# Patient Record
Sex: Male | Born: 1960 | Race: White | Hispanic: No | Smoking: Current every day smoker
Health system: Southern US, Community
[De-identification: ages and names within clinical notes are randomized; demographics above are authoritative.]

## PROBLEM LIST (undated history)

## (undated) DIAGNOSIS — M4802 Spinal stenosis, cervical region: Secondary | ICD-10-CM

## (undated) DIAGNOSIS — E039 Hypothyroidism, unspecified: Secondary | ICD-10-CM

## (undated) DIAGNOSIS — H9190 Unspecified hearing loss, unspecified ear: Secondary | ICD-10-CM

## (undated) DIAGNOSIS — C61 Malignant neoplasm of prostate: Secondary | ICD-10-CM

## (undated) DIAGNOSIS — E079 Disorder of thyroid, unspecified: Secondary | ICD-10-CM

## (undated) DIAGNOSIS — F329 Major depressive disorder, single episode, unspecified: Secondary | ICD-10-CM

## (undated) DIAGNOSIS — N529 Male erectile dysfunction, unspecified: Secondary | ICD-10-CM

## (undated) DIAGNOSIS — R972 Elevated prostate specific antigen [PSA]: Secondary | ICD-10-CM

## (undated) DIAGNOSIS — Z923 Personal history of irradiation: Secondary | ICD-10-CM

## (undated) DIAGNOSIS — R112 Nausea with vomiting, unspecified: Secondary | ICD-10-CM

## (undated) DIAGNOSIS — T8484XA Pain due to internal orthopedic prosthetic devices, implants and grafts, initial encounter: Secondary | ICD-10-CM

## (undated) DIAGNOSIS — J189 Pneumonia, unspecified organism: Secondary | ICD-10-CM

## (undated) DIAGNOSIS — C6290 Malignant neoplasm of unspecified testis, unspecified whether descended or undescended: Secondary | ICD-10-CM

## (undated) DIAGNOSIS — S81802A Unspecified open wound, left lower leg, initial encounter: Secondary | ICD-10-CM

## (undated) DIAGNOSIS — J069 Acute upper respiratory infection, unspecified: Secondary | ICD-10-CM

## (undated) DIAGNOSIS — B9620 Unspecified Escherichia coli [E. coli] as the cause of diseases classified elsewhere: Secondary | ICD-10-CM

## (undated) DIAGNOSIS — K7290 Hepatic failure, unspecified without coma: Secondary | ICD-10-CM

## (undated) DIAGNOSIS — N390 Urinary tract infection, site not specified: Secondary | ICD-10-CM

## (undated) DIAGNOSIS — C801 Malignant (primary) neoplasm, unspecified: Secondary | ICD-10-CM

## (undated) DIAGNOSIS — S129XXA Fracture of neck, unspecified, initial encounter: Secondary | ICD-10-CM

## (undated) DIAGNOSIS — Z972 Presence of dental prosthetic device (complete) (partial): Secondary | ICD-10-CM

## (undated) DIAGNOSIS — I898 Other specified noninfective disorders of lymphatic vessels and lymph nodes: Secondary | ICD-10-CM

## (undated) DIAGNOSIS — R300 Dysuria: Secondary | ICD-10-CM

## (undated) DIAGNOSIS — F524 Premature ejaculation: Secondary | ICD-10-CM

## (undated) DIAGNOSIS — K7460 Unspecified cirrhosis of liver: Secondary | ICD-10-CM

## (undated) DIAGNOSIS — E291 Testicular hypofunction: Secondary | ICD-10-CM

## (undated) DIAGNOSIS — K7682 Hepatic encephalopathy: Secondary | ICD-10-CM

## (undated) DIAGNOSIS — F1020 Alcohol dependence, uncomplicated: Secondary | ICD-10-CM

## (undated) DIAGNOSIS — C6292 Malignant neoplasm of left testis, unspecified whether descended or undescended: Secondary | ICD-10-CM

## (undated) DIAGNOSIS — R350 Frequency of micturition: Secondary | ICD-10-CM

## (undated) DIAGNOSIS — Z9889 Other specified postprocedural states: Secondary | ICD-10-CM

## (undated) DIAGNOSIS — K08109 Complete loss of teeth, unspecified cause, unspecified class: Secondary | ICD-10-CM

## (undated) DIAGNOSIS — R569 Unspecified convulsions: Secondary | ICD-10-CM

## (undated) DIAGNOSIS — M549 Dorsalgia, unspecified: Secondary | ICD-10-CM

## (undated) DIAGNOSIS — T8131XA Disruption of external operation (surgical) wound, not elsewhere classified, initial encounter: Secondary | ICD-10-CM

## (undated) DIAGNOSIS — R600 Localized edema: Secondary | ICD-10-CM

## (undated) DIAGNOSIS — F419 Anxiety disorder, unspecified: Secondary | ICD-10-CM

## (undated) DIAGNOSIS — F32A Depression, unspecified: Secondary | ICD-10-CM

## (undated) HISTORY — PX: NECK SURGERY: SHX720

## (undated) HISTORY — PX: DENTAL SURGERY: SHX609

## (undated) HISTORY — PX: PROSTATE SURGERY: SHX751

## (undated) HISTORY — PX: BACK SURGERY: SHX140

## (undated) HISTORY — PX: LEG SURGERY: SHX1003

## (undated) HISTORY — PX: APPENDECTOMY: SHX54

## (undated) HISTORY — PX: MULTIPLE TOOTH EXTRACTIONS: SHX2053

---

## 1997-10-28 ENCOUNTER — Emergency Department (HOSPITAL_COMMUNITY): Admission: EM | Admit: 1997-10-28 | Discharge: 1997-10-28 | Payer: Self-pay | Admitting: Emergency Medicine

## 1998-03-21 ENCOUNTER — Emergency Department (HOSPITAL_COMMUNITY): Admission: EM | Admit: 1998-03-21 | Discharge: 1998-03-21 | Payer: Self-pay | Admitting: *Deleted

## 1999-04-19 ENCOUNTER — Encounter: Payer: Self-pay | Admitting: Emergency Medicine

## 1999-04-19 ENCOUNTER — Emergency Department (HOSPITAL_COMMUNITY): Admission: EM | Admit: 1999-04-19 | Discharge: 1999-04-19 | Payer: Self-pay | Admitting: Emergency Medicine

## 1999-04-26 ENCOUNTER — Emergency Department (HOSPITAL_COMMUNITY): Admission: EM | Admit: 1999-04-26 | Discharge: 1999-04-26 | Payer: Self-pay | Admitting: Emergency Medicine

## 2000-01-21 ENCOUNTER — Encounter: Payer: Self-pay | Admitting: Emergency Medicine

## 2000-01-21 ENCOUNTER — Emergency Department (HOSPITAL_COMMUNITY): Admission: EM | Admit: 2000-01-21 | Discharge: 2000-01-21 | Payer: Self-pay | Admitting: Emergency Medicine

## 2000-03-25 ENCOUNTER — Encounter: Payer: Self-pay | Admitting: Emergency Medicine

## 2000-03-25 ENCOUNTER — Emergency Department (HOSPITAL_COMMUNITY): Admission: EM | Admit: 2000-03-25 | Discharge: 2000-03-25 | Payer: Self-pay | Admitting: Emergency Medicine

## 2001-04-21 ENCOUNTER — Emergency Department (HOSPITAL_COMMUNITY): Admission: EM | Admit: 2001-04-21 | Discharge: 2001-04-21 | Payer: Self-pay | Admitting: *Deleted

## 2002-06-03 ENCOUNTER — Encounter: Payer: Self-pay | Admitting: Emergency Medicine

## 2002-06-03 ENCOUNTER — Emergency Department (HOSPITAL_COMMUNITY): Admission: EM | Admit: 2002-06-03 | Discharge: 2002-06-04 | Payer: Self-pay | Admitting: Emergency Medicine

## 2003-01-14 ENCOUNTER — Encounter: Payer: Self-pay | Admitting: Emergency Medicine

## 2003-01-14 ENCOUNTER — Inpatient Hospital Stay (HOSPITAL_COMMUNITY): Admission: EM | Admit: 2003-01-14 | Discharge: 2003-01-25 | Payer: Self-pay | Admitting: Emergency Medicine

## 2003-01-17 ENCOUNTER — Encounter: Payer: Self-pay | Admitting: Internal Medicine

## 2003-01-20 ENCOUNTER — Encounter: Payer: Self-pay | Admitting: Internal Medicine

## 2003-01-20 ENCOUNTER — Encounter (INDEPENDENT_AMBULATORY_CARE_PROVIDER_SITE_OTHER): Payer: Self-pay | Admitting: Emergency Medicine

## 2003-01-22 ENCOUNTER — Encounter: Payer: Self-pay | Admitting: Internal Medicine

## 2003-01-23 ENCOUNTER — Encounter (INDEPENDENT_AMBULATORY_CARE_PROVIDER_SITE_OTHER): Payer: Self-pay | Admitting: Infectious Diseases

## 2003-03-25 ENCOUNTER — Emergency Department (HOSPITAL_COMMUNITY): Admission: EM | Admit: 2003-03-25 | Discharge: 2003-03-25 | Payer: Self-pay | Admitting: *Deleted

## 2004-06-03 ENCOUNTER — Emergency Department (HOSPITAL_COMMUNITY): Admission: EM | Admit: 2004-06-03 | Discharge: 2004-06-03 | Payer: Self-pay | Admitting: Emergency Medicine

## 2004-06-11 ENCOUNTER — Emergency Department (HOSPITAL_COMMUNITY): Admission: EM | Admit: 2004-06-11 | Discharge: 2004-06-11 | Payer: Self-pay | Admitting: Emergency Medicine

## 2004-06-15 ENCOUNTER — Emergency Department (HOSPITAL_COMMUNITY): Admission: EM | Admit: 2004-06-15 | Discharge: 2004-06-15 | Payer: Self-pay | Admitting: *Deleted

## 2004-06-24 ENCOUNTER — Emergency Department (HOSPITAL_COMMUNITY): Admission: EM | Admit: 2004-06-24 | Discharge: 2004-06-24 | Payer: Self-pay | Admitting: Emergency Medicine

## 2004-07-01 ENCOUNTER — Emergency Department (HOSPITAL_COMMUNITY): Admission: EM | Admit: 2004-07-01 | Discharge: 2004-07-01 | Payer: Self-pay | Admitting: Emergency Medicine

## 2004-07-07 ENCOUNTER — Emergency Department (HOSPITAL_COMMUNITY): Admission: EM | Admit: 2004-07-07 | Discharge: 2004-07-07 | Payer: Self-pay | Admitting: Emergency Medicine

## 2004-07-13 ENCOUNTER — Emergency Department (HOSPITAL_COMMUNITY): Admission: EM | Admit: 2004-07-13 | Discharge: 2004-07-13 | Payer: Self-pay | Admitting: Emergency Medicine

## 2004-09-26 ENCOUNTER — Emergency Department (HOSPITAL_COMMUNITY): Admission: EM | Admit: 2004-09-26 | Discharge: 2004-09-26 | Payer: Self-pay | Admitting: Family Medicine

## 2004-12-10 ENCOUNTER — Ambulatory Visit: Payer: Self-pay | Admitting: Internal Medicine

## 2004-12-12 ENCOUNTER — Ambulatory Visit: Payer: Self-pay | Admitting: *Deleted

## 2004-12-25 ENCOUNTER — Ambulatory Visit: Payer: Self-pay | Admitting: Family Medicine

## 2005-01-08 ENCOUNTER — Ambulatory Visit: Payer: Self-pay | Admitting: Family Medicine

## 2005-01-09 ENCOUNTER — Ambulatory Visit (HOSPITAL_COMMUNITY): Admission: RE | Admit: 2005-01-09 | Discharge: 2005-01-09 | Payer: Self-pay | Admitting: Family Medicine

## 2005-01-22 ENCOUNTER — Ambulatory Visit: Payer: Self-pay | Admitting: Family Medicine

## 2005-03-11 ENCOUNTER — Ambulatory Visit: Payer: Self-pay | Admitting: Family Medicine

## 2005-04-09 ENCOUNTER — Ambulatory Visit: Payer: Self-pay | Admitting: Family Medicine

## 2005-06-10 ENCOUNTER — Ambulatory Visit: Payer: Self-pay | Admitting: Family Medicine

## 2005-09-09 ENCOUNTER — Ambulatory Visit: Payer: Self-pay | Admitting: Family Medicine

## 2005-09-29 ENCOUNTER — Ambulatory Visit: Payer: Self-pay | Admitting: Family Medicine

## 2005-10-13 ENCOUNTER — Ambulatory Visit: Payer: Self-pay | Admitting: Family Medicine

## 2005-12-18 ENCOUNTER — Observation Stay (HOSPITAL_COMMUNITY): Admission: EM | Admit: 2005-12-18 | Discharge: 2005-12-20 | Payer: Self-pay | Admitting: Emergency Medicine

## 2005-12-18 ENCOUNTER — Ambulatory Visit: Payer: Self-pay | Admitting: Family Medicine

## 2005-12-22 ENCOUNTER — Encounter (HOSPITAL_COMMUNITY): Admission: RE | Admit: 2005-12-22 | Discharge: 2006-01-08 | Payer: Self-pay | Admitting: Family Medicine

## 2006-01-05 ENCOUNTER — Ambulatory Visit: Payer: Self-pay | Admitting: Family Medicine

## 2006-01-12 ENCOUNTER — Encounter (HOSPITAL_COMMUNITY): Admission: RE | Admit: 2006-01-12 | Discharge: 2006-04-12 | Payer: Self-pay | Admitting: Family Medicine

## 2006-01-12 ENCOUNTER — Ambulatory Visit: Payer: Self-pay | Admitting: Family Medicine

## 2006-01-26 ENCOUNTER — Ambulatory Visit: Payer: Self-pay | Admitting: Family Medicine

## 2006-01-29 ENCOUNTER — Ambulatory Visit (HOSPITAL_COMMUNITY): Admission: RE | Admit: 2006-01-29 | Discharge: 2006-01-29 | Payer: Self-pay | Admitting: Family Medicine

## 2006-01-29 ENCOUNTER — Encounter (INDEPENDENT_AMBULATORY_CARE_PROVIDER_SITE_OTHER): Payer: Self-pay | Admitting: Cardiology

## 2006-01-30 ENCOUNTER — Ambulatory Visit: Payer: Self-pay | Admitting: Family Medicine

## 2006-01-30 ENCOUNTER — Inpatient Hospital Stay (HOSPITAL_COMMUNITY): Admission: EM | Admit: 2006-01-30 | Discharge: 2006-02-03 | Payer: Self-pay | Admitting: Emergency Medicine

## 2006-02-09 ENCOUNTER — Ambulatory Visit: Payer: Self-pay | Admitting: Family Medicine

## 2006-02-23 ENCOUNTER — Ambulatory Visit: Payer: Self-pay | Admitting: Family Medicine

## 2006-02-24 ENCOUNTER — Ambulatory Visit (HOSPITAL_COMMUNITY): Admission: RE | Admit: 2006-02-24 | Discharge: 2006-02-24 | Payer: Self-pay | Admitting: Family Medicine

## 2006-03-10 ENCOUNTER — Ambulatory Visit: Payer: Self-pay | Admitting: Family Medicine

## 2006-03-30 ENCOUNTER — Encounter: Admission: RE | Admit: 2006-03-30 | Discharge: 2006-03-30 | Payer: Self-pay | Admitting: Gastroenterology

## 2006-03-31 ENCOUNTER — Ambulatory Visit: Payer: Self-pay | Admitting: *Deleted

## 2006-04-06 ENCOUNTER — Ambulatory Visit: Payer: Self-pay | Admitting: Family Medicine

## 2006-06-08 ENCOUNTER — Ambulatory Visit: Payer: Self-pay | Admitting: Family Medicine

## 2006-06-19 ENCOUNTER — Ambulatory Visit: Payer: Self-pay | Admitting: Family Medicine

## 2006-07-29 ENCOUNTER — Ambulatory Visit: Payer: Self-pay | Admitting: Dentistry

## 2006-07-29 ENCOUNTER — Encounter: Admission: AD | Admit: 2006-07-29 | Discharge: 2006-07-29 | Payer: Self-pay | Admitting: Dentistry

## 2006-08-13 ENCOUNTER — Ambulatory Visit (HOSPITAL_COMMUNITY): Admission: RE | Admit: 2006-08-13 | Discharge: 2006-08-13 | Payer: Self-pay | Admitting: Dentistry

## 2006-08-24 ENCOUNTER — Ambulatory Visit: Payer: Self-pay | Admitting: Family Medicine

## 2006-09-02 ENCOUNTER — Ambulatory Visit: Payer: Self-pay | Admitting: Family Medicine

## 2006-09-14 ENCOUNTER — Ambulatory Visit: Payer: Self-pay | Admitting: Dentistry

## 2006-10-26 ENCOUNTER — Ambulatory Visit: Payer: Self-pay | Admitting: Family Medicine

## 2006-10-28 ENCOUNTER — Ambulatory Visit: Payer: Self-pay | Admitting: Dentistry

## 2006-11-03 ENCOUNTER — Ambulatory Visit: Payer: Self-pay | Admitting: Family Medicine

## 2006-11-16 ENCOUNTER — Ambulatory Visit: Payer: Self-pay | Admitting: Family Medicine

## 2006-11-16 LAB — CONVERTED CEMR LAB
Barbiturate Quant, Ur: NEGATIVE
Benzodiazepines.: NEGATIVE
Cocaine Metabolites: NEGATIVE
Creatinine,U: 19.6 mg/dL
Methadone: NEGATIVE

## 2006-12-08 ENCOUNTER — Ambulatory Visit: Payer: Self-pay | Admitting: Family Medicine

## 2006-12-09 ENCOUNTER — Encounter (INDEPENDENT_AMBULATORY_CARE_PROVIDER_SITE_OTHER): Payer: Self-pay | Admitting: Family Medicine

## 2006-12-09 LAB — CONVERTED CEMR LAB
Calcium: 9.3 mg/dL (ref 8.4–10.5)
Free T4: 1.77 ng/dL (ref 0.89–1.80)
Potassium: 4 meq/L (ref 3.5–5.3)
Sodium: 138 meq/L (ref 135–145)
TSH: <0.004 microintl units/mL — ABNORMAL LOW (ref 0.350–5.50)

## 2006-12-17 DIAGNOSIS — F411 Generalized anxiety disorder: Secondary | ICD-10-CM | POA: Insufficient documentation

## 2006-12-17 DIAGNOSIS — K7460 Unspecified cirrhosis of liver: Secondary | ICD-10-CM | POA: Insufficient documentation

## 2006-12-17 DIAGNOSIS — F1011 Alcohol abuse, in remission: Secondary | ICD-10-CM | POA: Insufficient documentation

## 2006-12-17 DIAGNOSIS — R569 Unspecified convulsions: Secondary | ICD-10-CM | POA: Insufficient documentation

## 2006-12-17 DIAGNOSIS — Z8701 Personal history of pneumonia (recurrent): Secondary | ICD-10-CM | POA: Insufficient documentation

## 2007-01-05 ENCOUNTER — Ambulatory Visit: Payer: Self-pay | Admitting: Dentistry

## 2007-01-13 ENCOUNTER — Encounter (INDEPENDENT_AMBULATORY_CARE_PROVIDER_SITE_OTHER): Payer: Self-pay | Admitting: *Deleted

## 2007-02-05 ENCOUNTER — Ambulatory Visit: Payer: Self-pay | Admitting: Family Medicine

## 2007-02-05 LAB — CONVERTED CEMR LAB
Alcohol, Ethyl (B): <5 mg/dL (ref 0–10)
Benzodiazepines.: NEGATIVE
Creatinine,U: 52 mg/dL
Methadone: NEGATIVE
Phencyclidine (PCP): NEGATIVE
Propoxyphene: NEGATIVE

## 2007-03-04 ENCOUNTER — Telehealth (INDEPENDENT_AMBULATORY_CARE_PROVIDER_SITE_OTHER): Payer: Self-pay | Admitting: *Deleted

## 2007-03-05 ENCOUNTER — Ambulatory Visit: Payer: Self-pay | Admitting: Family Medicine

## 2007-03-05 LAB — CONVERTED CEMR LAB
Alcohol, Ethyl (B): <5 mg/dL (ref 0–10)
Benzodiazepines.: NEGATIVE
Creatinine,U: 32.3 mg/dL
Marijuana Metabolite: NEGATIVE
Methadone: NEGATIVE
Phencyclidine (PCP): NEGATIVE
Propoxyphene: NEGATIVE

## 2007-03-10 ENCOUNTER — Encounter (INDEPENDENT_AMBULATORY_CARE_PROVIDER_SITE_OTHER): Payer: Self-pay | Admitting: Family Medicine

## 2007-03-30 ENCOUNTER — Ambulatory Visit: Payer: Self-pay | Admitting: Family Medicine

## 2007-03-30 DIAGNOSIS — E890 Postprocedural hypothyroidism: Secondary | ICD-10-CM

## 2007-03-31 ENCOUNTER — Telehealth (INDEPENDENT_AMBULATORY_CARE_PROVIDER_SITE_OTHER): Payer: Self-pay | Admitting: Family Medicine

## 2007-04-01 ENCOUNTER — Encounter (INDEPENDENT_AMBULATORY_CARE_PROVIDER_SITE_OTHER): Payer: Self-pay | Admitting: Family Medicine

## 2007-04-01 LAB — CONVERTED CEMR LAB
Amphetamine Screen, Ur: NEGATIVE
Basophils Relative: 0 % (ref 0–1)
Benzodiazepines.: NEGATIVE
Lymphocytes Relative: 20 % (ref 12–46)
MCHC: 36 g/dL (ref 30.0–36.0)
Methadone: NEGATIVE
Monocytes Relative: 8 % (ref 3–12)
Neutro Abs: 8 10*3/uL — ABNORMAL HIGH (ref 1.7–7.7)
Neutrophils Relative %: 69 % (ref 43–77)
Phencyclidine (PCP): NEGATIVE
Propoxyphene: NEGATIVE
RBC: 4.89 M/uL (ref 4.22–5.81)
TSH: 0.017 microintl units/mL — ABNORMAL LOW (ref 0.350–5.50)
WBC: 11.6 10*3/uL — ABNORMAL HIGH (ref 4.0–10.5)

## 2007-04-05 ENCOUNTER — Encounter (INDEPENDENT_AMBULATORY_CARE_PROVIDER_SITE_OTHER): Payer: Self-pay | Admitting: Family Medicine

## 2007-04-21 ENCOUNTER — Telehealth (INDEPENDENT_AMBULATORY_CARE_PROVIDER_SITE_OTHER): Payer: Self-pay | Admitting: Family Medicine

## 2007-05-06 ENCOUNTER — Encounter (INDEPENDENT_AMBULATORY_CARE_PROVIDER_SITE_OTHER): Payer: Self-pay | Admitting: Family Medicine

## 2007-05-10 ENCOUNTER — Telehealth (INDEPENDENT_AMBULATORY_CARE_PROVIDER_SITE_OTHER): Payer: Self-pay | Admitting: Family Medicine

## 2007-05-31 ENCOUNTER — Ambulatory Visit: Payer: Self-pay | Admitting: Family Medicine

## 2007-07-07 ENCOUNTER — Telehealth (INDEPENDENT_AMBULATORY_CARE_PROVIDER_SITE_OTHER): Payer: Self-pay | Admitting: Family Medicine

## 2007-07-07 ENCOUNTER — Ambulatory Visit: Payer: Self-pay | Admitting: Family Medicine

## 2007-07-07 DIAGNOSIS — M545 Low back pain: Secondary | ICD-10-CM | POA: Insufficient documentation

## 2007-07-07 LAB — CONVERTED CEMR LAB
T3, Total: 215.3 ng/dL — ABNORMAL HIGH (ref 80.0–204.0)
TSH: 0.004 microintl units/mL — ABNORMAL LOW (ref 0.350–5.50)

## 2007-07-08 ENCOUNTER — Encounter (INDEPENDENT_AMBULATORY_CARE_PROVIDER_SITE_OTHER): Payer: Self-pay | Admitting: Family Medicine

## 2007-07-09 LAB — CONVERTED CEMR LAB
BUN: 5 mg/dL — ABNORMAL LOW (ref 6–23)
CO2: 20 meq/L (ref 19–32)
Calcium: 8.8 mg/dL (ref 8.4–10.5)
Chloride: 109 meq/L (ref 96–112)
Creatinine, Ser: 0.85 mg/dL (ref 0.40–1.50)
TSH: <0.004 microintl units/mL — ABNORMAL LOW (ref 0.350–5.50)

## 2007-08-05 ENCOUNTER — Encounter (INDEPENDENT_AMBULATORY_CARE_PROVIDER_SITE_OTHER): Payer: Self-pay | Admitting: Family Medicine

## 2007-08-25 ENCOUNTER — Telehealth (INDEPENDENT_AMBULATORY_CARE_PROVIDER_SITE_OTHER): Payer: Self-pay | Admitting: Family Medicine

## 2007-09-06 ENCOUNTER — Ambulatory Visit: Payer: Self-pay | Admitting: Family Medicine

## 2007-09-08 ENCOUNTER — Encounter (INDEPENDENT_AMBULATORY_CARE_PROVIDER_SITE_OTHER): Payer: Self-pay | Admitting: Family Medicine

## 2007-09-08 LAB — CONVERTED CEMR LAB
ALT: 15 units/L (ref 0–53)
CO2: 26 meq/L (ref 19–32)
Calcium: 9 mg/dL (ref 8.4–10.5)
Chloride: 101 meq/L (ref 96–112)
Potassium: 4.2 meq/L (ref 3.5–5.3)
Sodium: 138 meq/L (ref 135–145)
T3 Uptake Ratio: 36.1 % (ref 22.5–37.0)
Total Protein: 7 g/dL (ref 6.0–8.3)

## 2007-09-15 ENCOUNTER — Ambulatory Visit (HOSPITAL_COMMUNITY): Admission: RE | Admit: 2007-09-15 | Discharge: 2007-09-15 | Payer: Self-pay | Admitting: Family Medicine

## 2007-09-17 ENCOUNTER — Encounter: Payer: Self-pay | Admitting: Specialist

## 2007-09-27 ENCOUNTER — Encounter: Payer: Self-pay | Admitting: Specialist

## 2007-12-09 ENCOUNTER — Encounter (INDEPENDENT_AMBULATORY_CARE_PROVIDER_SITE_OTHER): Payer: Self-pay | Admitting: Family Medicine

## 2008-01-20 ENCOUNTER — Telehealth (INDEPENDENT_AMBULATORY_CARE_PROVIDER_SITE_OTHER): Payer: Self-pay | Admitting: *Deleted

## 2008-01-30 ENCOUNTER — Emergency Department (HOSPITAL_COMMUNITY): Admission: EM | Admit: 2008-01-30 | Discharge: 2008-01-30 | Payer: Self-pay | Admitting: Emergency Medicine

## 2008-02-01 ENCOUNTER — Encounter (INDEPENDENT_AMBULATORY_CARE_PROVIDER_SITE_OTHER): Payer: Self-pay | Admitting: Family Medicine

## 2008-02-01 ENCOUNTER — Ambulatory Visit (HOSPITAL_COMMUNITY): Admission: RE | Admit: 2008-02-01 | Discharge: 2008-02-01 | Payer: Self-pay | Admitting: Gastroenterology

## 2008-02-08 ENCOUNTER — Encounter (INDEPENDENT_AMBULATORY_CARE_PROVIDER_SITE_OTHER): Payer: Self-pay | Admitting: Family Medicine

## 2008-02-25 ENCOUNTER — Encounter (INDEPENDENT_AMBULATORY_CARE_PROVIDER_SITE_OTHER): Payer: Self-pay | Admitting: Family Medicine

## 2008-03-17 ENCOUNTER — Encounter (INDEPENDENT_AMBULATORY_CARE_PROVIDER_SITE_OTHER): Payer: Self-pay | Admitting: Family Medicine

## 2008-03-21 ENCOUNTER — Encounter (INDEPENDENT_AMBULATORY_CARE_PROVIDER_SITE_OTHER): Payer: Self-pay | Admitting: Family Medicine

## 2008-04-03 ENCOUNTER — Ambulatory Visit: Payer: Self-pay | Admitting: Family Medicine

## 2008-04-13 ENCOUNTER — Telehealth (INDEPENDENT_AMBULATORY_CARE_PROVIDER_SITE_OTHER): Payer: Self-pay | Admitting: *Deleted

## 2008-04-13 LAB — CONVERTED CEMR LAB
Cholesterol: 212 mg/dL — ABNORMAL HIGH (ref 0–200)
Free T4: 0.93 ng/dL (ref 0.89–1.80)
TSH: 0.009 microintl units/mL — ABNORMAL LOW (ref 0.350–4.50)
Total CHOL/HDL Ratio: 5.9
VLDL: 18 mg/dL (ref 0–40)

## 2008-04-28 DIAGNOSIS — C6292 Malignant neoplasm of left testis, unspecified whether descended or undescended: Secondary | ICD-10-CM

## 2008-04-28 HISTORY — PX: ORCHIECTOMY: SHX2116

## 2008-04-28 HISTORY — DX: Malignant neoplasm of left testis, unspecified whether descended or undescended: C62.92

## 2008-05-04 ENCOUNTER — Encounter (INDEPENDENT_AMBULATORY_CARE_PROVIDER_SITE_OTHER): Payer: Self-pay | Admitting: Family Medicine

## 2008-05-15 ENCOUNTER — Encounter (INDEPENDENT_AMBULATORY_CARE_PROVIDER_SITE_OTHER): Payer: Self-pay | Admitting: Family Medicine

## 2008-05-18 ENCOUNTER — Encounter (HOSPITAL_COMMUNITY): Admission: RE | Admit: 2008-05-18 | Discharge: 2008-08-16 | Payer: Self-pay | Admitting: Internal Medicine

## 2008-05-26 ENCOUNTER — Encounter (INDEPENDENT_AMBULATORY_CARE_PROVIDER_SITE_OTHER): Payer: Self-pay | Admitting: Family Medicine

## 2008-06-05 ENCOUNTER — Encounter (HOSPITAL_COMMUNITY): Admission: RE | Admit: 2008-06-05 | Discharge: 2008-09-03 | Payer: Self-pay | Admitting: Internal Medicine

## 2008-07-06 ENCOUNTER — Encounter (INDEPENDENT_AMBULATORY_CARE_PROVIDER_SITE_OTHER): Payer: Self-pay | Admitting: Family Medicine

## 2008-08-03 ENCOUNTER — Encounter (INDEPENDENT_AMBULATORY_CARE_PROVIDER_SITE_OTHER): Payer: Self-pay | Admitting: Family Medicine

## 2008-09-13 ENCOUNTER — Ambulatory Visit: Payer: Self-pay | Admitting: Family Medicine

## 2008-09-13 DIAGNOSIS — E039 Hypothyroidism, unspecified: Secondary | ICD-10-CM | POA: Insufficient documentation

## 2008-10-20 ENCOUNTER — Emergency Department: Payer: Self-pay | Admitting: Emergency Medicine

## 2008-10-26 ENCOUNTER — Encounter: Payer: Self-pay | Admitting: Physician Assistant

## 2008-10-31 ENCOUNTER — Ambulatory Visit: Payer: Self-pay | Admitting: Physician Assistant

## 2008-10-31 DIAGNOSIS — N5089 Other specified disorders of the male genital organs: Secondary | ICD-10-CM | POA: Insufficient documentation

## 2008-10-31 LAB — CONVERTED CEMR LAB
Bilirubin Urine: NEGATIVE
Free T4: 1.21 ng/dL
Protein, U semiquant: NEGATIVE
Specific Gravity, Urine: 1.01
TSH: 0.971 microintl units/mL
Urobilinogen, UA: 0.2

## 2008-11-01 ENCOUNTER — Encounter (INDEPENDENT_AMBULATORY_CARE_PROVIDER_SITE_OTHER): Payer: Self-pay | Admitting: Urology

## 2008-11-01 ENCOUNTER — Observation Stay (HOSPITAL_COMMUNITY): Admission: EM | Admit: 2008-11-01 | Discharge: 2008-11-02 | Payer: Self-pay | Admitting: Emergency Medicine

## 2008-11-01 ENCOUNTER — Ambulatory Visit (HOSPITAL_COMMUNITY): Admission: RE | Admit: 2008-11-01 | Discharge: 2008-11-01 | Payer: Self-pay | Admitting: Internal Medicine

## 2008-11-02 ENCOUNTER — Encounter (INDEPENDENT_AMBULATORY_CARE_PROVIDER_SITE_OTHER): Payer: Self-pay | Admitting: Internal Medicine

## 2008-11-06 ENCOUNTER — Telehealth: Payer: Self-pay | Admitting: Physician Assistant

## 2008-11-07 ENCOUNTER — Encounter: Payer: Self-pay | Admitting: Physician Assistant

## 2008-11-07 DIAGNOSIS — Z9079 Acquired absence of other genital organ(s): Secondary | ICD-10-CM | POA: Insufficient documentation

## 2008-11-08 ENCOUNTER — Encounter (INDEPENDENT_AMBULATORY_CARE_PROVIDER_SITE_OTHER): Payer: Self-pay | Admitting: Nurse Practitioner

## 2008-11-09 ENCOUNTER — Telehealth: Payer: Self-pay | Admitting: Physician Assistant

## 2008-11-13 ENCOUNTER — Encounter: Admission: RE | Admit: 2008-11-13 | Discharge: 2008-11-13 | Payer: Self-pay | Admitting: Gastroenterology

## 2008-11-22 ENCOUNTER — Ambulatory Visit: Admission: RE | Admit: 2008-11-22 | Discharge: 2009-01-09 | Payer: Self-pay | Admitting: Radiation Oncology

## 2008-11-23 ENCOUNTER — Encounter (INDEPENDENT_AMBULATORY_CARE_PROVIDER_SITE_OTHER): Payer: Self-pay | Admitting: Nurse Practitioner

## 2008-12-01 ENCOUNTER — Encounter: Payer: Self-pay | Admitting: Physician Assistant

## 2008-12-01 DIAGNOSIS — C6290 Malignant neoplasm of unspecified testis, unspecified whether descended or undescended: Secondary | ICD-10-CM

## 2008-12-21 ENCOUNTER — Encounter: Payer: Self-pay | Admitting: Physician Assistant

## 2009-01-25 ENCOUNTER — Encounter: Payer: Self-pay | Admitting: Physician Assistant

## 2009-01-25 LAB — CONVERTED CEMR LAB
ALT: 16 units/L
AST: 20 units/L
Alkaline Phosphatase: 103 units/L
Chloride: 99 meq/L
Creatinine, Ser: 1 mg/dL
HCT: 33.2 %
Hemoglobin: 11.9 g/dL
MCHC: 35.7 g/dL
MCV: 91.6 fL
Platelets: 107 10*3/uL
RDW: 15.1 %
Total Bilirubin: 0.5 mg/dL

## 2009-01-26 ENCOUNTER — Encounter: Payer: Self-pay | Admitting: Physician Assistant

## 2009-01-26 ENCOUNTER — Ambulatory Visit (HOSPITAL_COMMUNITY): Admission: RE | Admit: 2009-01-26 | Discharge: 2009-01-26 | Payer: Self-pay | Admitting: Gastroenterology

## 2009-02-01 ENCOUNTER — Telehealth: Payer: Self-pay | Admitting: Physician Assistant

## 2009-02-02 ENCOUNTER — Ambulatory Visit: Payer: Self-pay | Admitting: Physician Assistant

## 2009-02-09 LAB — CONVERTED CEMR LAB
BUN: 7 mg/dL (ref 6–23)
CO2: 17 meq/L — ABNORMAL LOW (ref 19–32)
Glucose, Bld: 100 mg/dL — ABNORMAL HIGH (ref 70–99)
Phenytoin Lvl: 2 ug/mL — ABNORMAL LOW (ref 10.0–20.0)
Potassium: 3.9 meq/L (ref 3.5–5.3)

## 2009-02-12 ENCOUNTER — Ambulatory Visit: Payer: Self-pay | Admitting: Physician Assistant

## 2009-02-12 LAB — CONVERTED CEMR LAB
BUN: 12 mg/dL (ref 6–23)
CO2: 24 meq/L (ref 19–32)
Calcium: 9.4 mg/dL (ref 8.4–10.5)
Creatinine, Ser: 1.11 mg/dL (ref 0.40–1.50)
Glucose, Bld: 99 mg/dL (ref 70–99)

## 2009-02-13 ENCOUNTER — Encounter: Payer: Self-pay | Admitting: Physician Assistant

## 2009-02-16 ENCOUNTER — Encounter: Payer: Self-pay | Admitting: Physician Assistant

## 2009-02-20 ENCOUNTER — Telehealth: Payer: Self-pay | Admitting: Physician Assistant

## 2009-03-16 ENCOUNTER — Encounter: Payer: Self-pay | Admitting: Physician Assistant

## 2009-03-19 ENCOUNTER — Encounter: Admission: RE | Admit: 2009-03-19 | Discharge: 2009-03-19 | Payer: Self-pay | Admitting: Gastroenterology

## 2009-03-20 ENCOUNTER — Encounter: Payer: Self-pay | Admitting: Physician Assistant

## 2009-03-29 ENCOUNTER — Ambulatory Visit: Payer: Self-pay | Admitting: Physician Assistant

## 2009-04-10 ENCOUNTER — Encounter: Payer: Self-pay | Admitting: Physician Assistant

## 2009-04-24 ENCOUNTER — Telehealth: Payer: Self-pay | Admitting: Physician Assistant

## 2009-04-25 ENCOUNTER — Telehealth: Payer: Self-pay | Admitting: Physician Assistant

## 2009-04-25 ENCOUNTER — Encounter: Payer: Self-pay | Admitting: Physician Assistant

## 2009-05-01 ENCOUNTER — Encounter: Payer: Self-pay | Admitting: Physician Assistant

## 2009-05-06 ENCOUNTER — Encounter: Payer: Self-pay | Admitting: Physician Assistant

## 2009-05-09 ENCOUNTER — Encounter: Payer: Self-pay | Admitting: Physician Assistant

## 2009-05-11 ENCOUNTER — Telehealth: Payer: Self-pay | Admitting: Physician Assistant

## 2009-05-14 ENCOUNTER — Ambulatory Visit: Admission: RE | Admit: 2009-05-14 | Discharge: 2009-05-14 | Payer: Self-pay | Admitting: Radiation Oncology

## 2009-05-16 ENCOUNTER — Encounter: Payer: Self-pay | Admitting: Physician Assistant

## 2009-05-16 LAB — BETA HCG QUANT (REF LAB): Beta hCG, Tumor Marker: 0.9 m[IU]/mL (ref <5.0)

## 2009-05-16 LAB — AFP TUMOR MARKER: AFP-Tumor Marker: 3.1 ng/mL (ref 0.0–8.0)

## 2009-05-16 LAB — LACTATE DEHYDROGENASE: LDH: 193 U/L (ref 94–250)

## 2009-05-17 ENCOUNTER — Ambulatory Visit (HOSPITAL_COMMUNITY): Admission: RE | Admit: 2009-05-17 | Discharge: 2009-05-17 | Payer: Self-pay | Admitting: Radiation Oncology

## 2009-05-29 ENCOUNTER — Encounter: Payer: Self-pay | Admitting: Physician Assistant

## 2009-05-29 ENCOUNTER — Ambulatory Visit: Admission: RE | Admit: 2009-05-29 | Discharge: 2009-05-29 | Payer: Self-pay | Admitting: Radiation Oncology

## 2009-05-31 LAB — AFP TUMOR MARKER: AFP-Tumor Marker: 3.6 ng/mL (ref 0.0–8.0)

## 2009-06-11 ENCOUNTER — Encounter: Payer: Self-pay | Admitting: Physician Assistant

## 2009-08-03 ENCOUNTER — Ambulatory Visit: Payer: Self-pay | Admitting: Physician Assistant

## 2009-08-06 ENCOUNTER — Encounter: Payer: Self-pay | Admitting: Physician Assistant

## 2009-08-06 DIAGNOSIS — D649 Anemia, unspecified: Secondary | ICD-10-CM | POA: Insufficient documentation

## 2009-08-06 LAB — CONVERTED CEMR LAB
AST: 22 units/L (ref 0–37)
Albumin: 4.3 g/dL (ref 3.5–5.2)
Alkaline Phosphatase: 114 units/L (ref 39–117)
BUN: 9 mg/dL (ref 6–23)
Calcium: 8.5 mg/dL (ref 8.4–10.5)
Chloride: 99 meq/L (ref 96–112)
Eosinophils Relative: 2 % (ref 0–5)
HCT: 36.8 % — ABNORMAL LOW (ref 39.0–52.0)
Hemoglobin: 11.9 g/dL — ABNORMAL LOW (ref 13.0–17.0)
Lymphocytes Relative: 9 % — ABNORMAL LOW (ref 12–46)
Lymphs Abs: 0.5 10*3/uL — ABNORMAL LOW (ref 0.7–4.0)
Monocytes Absolute: 0.5 10*3/uL (ref 0.1–1.0)
Neutro Abs: 4.6 10*3/uL (ref 1.7–7.7)
Platelets: 109 10*3/uL — ABNORMAL LOW (ref 150–400)
Potassium: 3.5 meq/L (ref 3.5–5.3)
Sodium: 136 meq/L (ref 135–145)
Total Protein: 6.9 g/dL (ref 6.0–8.3)
WBC: 5.7 10*3/uL (ref 4.0–10.5)

## 2009-08-08 ENCOUNTER — Encounter: Payer: Self-pay | Admitting: Physician Assistant

## 2009-08-08 LAB — CONVERTED CEMR LAB
Ferritin: 52 ng/mL (ref 22–322)
Folate: 3 ng/mL — ABNORMAL LOW
Iron: 47 ug/dL (ref 42–165)
Saturation Ratios: 15 % — ABNORMAL LOW (ref 20–55)
Vitamin B-12: 344 pg/mL (ref 211–911)

## 2009-08-16 ENCOUNTER — Ambulatory Visit (HOSPITAL_COMMUNITY): Admission: RE | Admit: 2009-08-16 | Discharge: 2009-08-16 | Payer: Self-pay | Admitting: Internal Medicine

## 2009-08-27 ENCOUNTER — Telehealth (INDEPENDENT_AMBULATORY_CARE_PROVIDER_SITE_OTHER): Payer: Self-pay | Admitting: *Deleted

## 2009-09-05 ENCOUNTER — Encounter: Admission: RE | Admit: 2009-09-05 | Discharge: 2009-09-05 | Payer: Self-pay | Admitting: Internal Medicine

## 2009-09-05 ENCOUNTER — Encounter: Payer: Self-pay | Admitting: Physician Assistant

## 2009-09-28 ENCOUNTER — Encounter: Payer: Self-pay | Admitting: Physician Assistant

## 2009-10-02 ENCOUNTER — Ambulatory Visit (HOSPITAL_COMMUNITY): Admission: RE | Admit: 2009-10-02 | Discharge: 2009-10-02 | Payer: Self-pay | Admitting: Gastroenterology

## 2009-10-16 ENCOUNTER — Telehealth: Payer: Self-pay | Admitting: Physician Assistant

## 2009-10-22 ENCOUNTER — Encounter: Payer: Self-pay | Admitting: Physician Assistant

## 2009-10-26 ENCOUNTER — Telehealth: Payer: Self-pay | Admitting: Physician Assistant

## 2009-10-31 ENCOUNTER — Ambulatory Visit: Payer: Self-pay | Admitting: Physician Assistant

## 2009-10-31 DIAGNOSIS — F329 Major depressive disorder, single episode, unspecified: Secondary | ICD-10-CM | POA: Insufficient documentation

## 2009-11-01 ENCOUNTER — Encounter: Payer: Self-pay | Admitting: Physician Assistant

## 2009-11-02 LAB — CONVERTED CEMR LAB
Basophils Absolute: 0 10*3/uL (ref 0.0–0.1)
Basophils Relative: 0 % (ref 0–1)
Eosinophils Absolute: 0.1 10*3/uL (ref 0.0–0.7)
Eosinophils Relative: 1 % (ref 0–5)
HCT: 39.7 % (ref 39.0–52.0)
Iron: 90 ug/dL (ref 42–165)
Lymphs Abs: 1 10*3/uL (ref 0.7–4.0)
MCHC: 34.3 g/dL (ref 30.0–36.0)
MCV: 95.2 fL (ref 78.0–100.0)
Neutrophils Relative %: 83 % — ABNORMAL HIGH (ref 43–77)
Platelets: 150 10*3/uL (ref 150–400)
RDW: 15.3 % (ref 11.5–15.5)
UIBC: 240 ug/dL
WBC: 10.3 10*3/uL (ref 4.0–10.5)

## 2009-11-13 ENCOUNTER — Telehealth: Payer: Self-pay | Admitting: Physician Assistant

## 2009-11-22 ENCOUNTER — Ambulatory Visit: Payer: Self-pay | Admitting: Physician Assistant

## 2009-11-22 DIAGNOSIS — N529 Male erectile dysfunction, unspecified: Secondary | ICD-10-CM

## 2009-11-22 DIAGNOSIS — E291 Testicular hypofunction: Secondary | ICD-10-CM | POA: Insufficient documentation

## 2009-12-04 ENCOUNTER — Ambulatory Visit (HOSPITAL_COMMUNITY): Admission: RE | Admit: 2009-12-04 | Discharge: 2009-12-04 | Payer: Self-pay | Admitting: Orthopedic Surgery

## 2009-12-04 ENCOUNTER — Ambulatory Visit: Admission: RE | Admit: 2009-12-04 | Discharge: 2009-12-04 | Payer: Self-pay | Admitting: Radiation Oncology

## 2009-12-07 LAB — AFP TUMOR MARKER: AFP-Tumor Marker: 2.9 ng/mL (ref 0.0–8.0)

## 2009-12-08 ENCOUNTER — Emergency Department (HOSPITAL_COMMUNITY): Admission: EM | Admit: 2009-12-08 | Discharge: 2009-12-08 | Payer: Self-pay | Admitting: Emergency Medicine

## 2009-12-27 ENCOUNTER — Ambulatory Visit: Payer: Self-pay | Admitting: Physician Assistant

## 2009-12-27 LAB — CONVERTED CEMR LAB
ALT: 10 units/L (ref 0–53)
AST: 19 units/L (ref 0–37)
Albumin: 4.3 g/dL (ref 3.5–5.2)
Calcium: 9.8 mg/dL (ref 8.4–10.5)
Chloride: 98 meq/L (ref 96–112)
Creatinine, Ser: 1.01 mg/dL (ref 0.40–1.50)
Sodium: 139 meq/L (ref 135–145)

## 2009-12-28 ENCOUNTER — Encounter: Payer: Self-pay | Admitting: Physician Assistant

## 2010-01-15 ENCOUNTER — Encounter: Payer: Self-pay | Admitting: Physician Assistant

## 2010-01-15 ENCOUNTER — Encounter (INDEPENDENT_AMBULATORY_CARE_PROVIDER_SITE_OTHER): Payer: Self-pay | Admitting: Internal Medicine

## 2010-01-25 ENCOUNTER — Encounter: Payer: Self-pay | Admitting: Physician Assistant

## 2010-01-31 ENCOUNTER — Telehealth: Payer: Self-pay | Admitting: Physician Assistant

## 2010-02-13 ENCOUNTER — Telehealth (INDEPENDENT_AMBULATORY_CARE_PROVIDER_SITE_OTHER): Payer: Self-pay | Admitting: Nurse Practitioner

## 2010-02-17 ENCOUNTER — Emergency Department (HOSPITAL_COMMUNITY): Admission: EM | Admit: 2010-02-17 | Discharge: 2010-02-17 | Payer: Self-pay | Admitting: Emergency Medicine

## 2010-02-17 DIAGNOSIS — M51379 Other intervertebral disc degeneration, lumbosacral region without mention of lumbar back pain or lower extremity pain: Secondary | ICD-10-CM | POA: Insufficient documentation

## 2010-02-17 DIAGNOSIS — M5137 Other intervertebral disc degeneration, lumbosacral region: Secondary | ICD-10-CM

## 2010-02-18 ENCOUNTER — Telehealth (INDEPENDENT_AMBULATORY_CARE_PROVIDER_SITE_OTHER): Payer: Self-pay | Admitting: Nurse Practitioner

## 2010-02-22 ENCOUNTER — Telehealth (INDEPENDENT_AMBULATORY_CARE_PROVIDER_SITE_OTHER): Payer: Self-pay | Admitting: *Deleted

## 2010-03-01 ENCOUNTER — Telehealth (INDEPENDENT_AMBULATORY_CARE_PROVIDER_SITE_OTHER): Payer: Self-pay | Admitting: Internal Medicine

## 2010-03-01 ENCOUNTER — Ambulatory Visit: Payer: Self-pay | Admitting: Internal Medicine

## 2010-03-01 DIAGNOSIS — J069 Acute upper respiratory infection, unspecified: Secondary | ICD-10-CM

## 2010-03-01 HISTORY — DX: Acute upper respiratory infection, unspecified: J06.9

## 2010-03-05 LAB — CONVERTED CEMR LAB: Saturation Ratios: 17 % — ABNORMAL LOW (ref 20–55)

## 2010-03-06 ENCOUNTER — Encounter: Payer: Self-pay | Admitting: Physician Assistant

## 2010-03-11 ENCOUNTER — Ambulatory Visit: Payer: Self-pay | Admitting: Internal Medicine

## 2010-03-11 ENCOUNTER — Encounter: Payer: Self-pay | Admitting: Physician Assistant

## 2010-03-12 LAB — CONVERTED CEMR LAB
Hemoglobin: 14.6 g/dL (ref 13.0–17.0)
MCHC: 34 g/dL (ref 30.0–36.0)
RBC: 4.47 M/uL (ref 4.22–5.81)
WBC: 7.2 10*3/uL (ref 4.0–10.5)

## 2010-03-14 ENCOUNTER — Emergency Department (HOSPITAL_COMMUNITY): Admission: EM | Admit: 2010-03-14 | Discharge: 2010-03-14 | Payer: Self-pay | Admitting: Emergency Medicine

## 2010-03-15 ENCOUNTER — Emergency Department (HOSPITAL_COMMUNITY)
Admission: EM | Admit: 2010-03-15 | Discharge: 2010-03-17 | Disposition: A | Discharge status: Other Acute Inpatient Hospital | Payer: Self-pay | Source: Home / Self Care | Admitting: Emergency Medicine

## 2010-03-16 DIAGNOSIS — F1020 Alcohol dependence, uncomplicated: Secondary | ICD-10-CM

## 2010-03-17 ENCOUNTER — Ambulatory Visit: Payer: Self-pay | Admitting: Psychiatry

## 2010-03-17 ENCOUNTER — Inpatient Hospital Stay (HOSPITAL_COMMUNITY)
Admission: RE | Admit: 2010-03-17 | Discharge: 2010-03-20 | Payer: Self-pay | Source: Home / Self Care | Admitting: Psychiatry

## 2010-03-20 ENCOUNTER — Telehealth (INDEPENDENT_AMBULATORY_CARE_PROVIDER_SITE_OTHER): Payer: Self-pay | Admitting: Internal Medicine

## 2010-03-20 ENCOUNTER — Encounter (INDEPENDENT_AMBULATORY_CARE_PROVIDER_SITE_OTHER): Payer: Self-pay | Admitting: Internal Medicine

## 2010-04-12 ENCOUNTER — Inpatient Hospital Stay (HOSPITAL_COMMUNITY)
Admission: EM | Admit: 2010-04-12 | Discharge: 2010-04-16 | Payer: Self-pay | Source: Home / Self Care | Attending: Internal Medicine | Admitting: Internal Medicine

## 2010-04-12 ENCOUNTER — Encounter (INDEPENDENT_AMBULATORY_CARE_PROVIDER_SITE_OTHER): Payer: Self-pay | Admitting: Internal Medicine

## 2010-04-12 DIAGNOSIS — F39 Unspecified mood [affective] disorder: Secondary | ICD-10-CM

## 2010-04-13 ENCOUNTER — Encounter (INDEPENDENT_AMBULATORY_CARE_PROVIDER_SITE_OTHER): Payer: Self-pay | Admitting: Internal Medicine

## 2010-04-23 ENCOUNTER — Encounter (INDEPENDENT_AMBULATORY_CARE_PROVIDER_SITE_OTHER): Payer: Self-pay | Admitting: *Deleted

## 2010-04-25 ENCOUNTER — Encounter
Admission: RE | Admit: 2010-04-25 | Discharge: 2010-04-25 | Payer: Self-pay | Source: Home / Self Care | Attending: Neurological Surgery | Admitting: Neurological Surgery

## 2010-04-30 ENCOUNTER — Telehealth (INDEPENDENT_AMBULATORY_CARE_PROVIDER_SITE_OTHER): Payer: Self-pay | Admitting: Internal Medicine

## 2010-05-01 ENCOUNTER — Ambulatory Visit
Admission: RE | Admit: 2010-05-01 | Discharge: 2010-05-01 | Payer: Self-pay | Source: Home / Self Care | Attending: Internal Medicine | Admitting: Internal Medicine

## 2010-05-01 ENCOUNTER — Encounter (INDEPENDENT_AMBULATORY_CARE_PROVIDER_SITE_OTHER): Payer: Self-pay | Admitting: Internal Medicine

## 2010-05-01 ENCOUNTER — Telehealth (INDEPENDENT_AMBULATORY_CARE_PROVIDER_SITE_OTHER): Payer: Self-pay | Admitting: Internal Medicine

## 2010-05-09 ENCOUNTER — Encounter (INDEPENDENT_AMBULATORY_CARE_PROVIDER_SITE_OTHER): Payer: Self-pay | Admitting: Internal Medicine

## 2010-05-14 ENCOUNTER — Encounter (INDEPENDENT_AMBULATORY_CARE_PROVIDER_SITE_OTHER): Payer: Self-pay | Admitting: Internal Medicine

## 2010-05-14 ENCOUNTER — Ambulatory Visit: Payer: Self-pay | Admitting: Pain Medicine

## 2010-05-16 ENCOUNTER — Telehealth (INDEPENDENT_AMBULATORY_CARE_PROVIDER_SITE_OTHER): Payer: Self-pay | Admitting: Internal Medicine

## 2010-05-17 ENCOUNTER — Other Ambulatory Visit: Payer: Self-pay | Admitting: Radiation Oncology

## 2010-05-17 DIAGNOSIS — C6290 Malignant neoplasm of unspecified testis, unspecified whether descended or undescended: Secondary | ICD-10-CM

## 2010-05-19 ENCOUNTER — Encounter: Payer: Self-pay | Admitting: Radiation Oncology

## 2010-05-19 ENCOUNTER — Encounter: Payer: Self-pay | Admitting: Internal Medicine

## 2010-05-22 ENCOUNTER — Ambulatory Visit: Payer: Self-pay | Admitting: Pain Medicine

## 2010-05-27 ENCOUNTER — Telehealth (INDEPENDENT_AMBULATORY_CARE_PROVIDER_SITE_OTHER): Payer: Self-pay | Admitting: Internal Medicine

## 2010-05-27 ENCOUNTER — Emergency Department (HOSPITAL_COMMUNITY)
Admission: EM | Admit: 2010-05-27 | Discharge: 2010-05-27 | Payer: Self-pay | Source: Home / Self Care | Admitting: Emergency Medicine

## 2010-05-28 NOTE — Progress Notes (Signed)
  Phone Note From Pharmacy   Summary of Call: [facility] pharmacy called for clarfication on pts tramadol and lidoderm... i called back at the pharmacist said it was resolved Initial call taken by: Armenia Shannon,  February 22, 2010 4:56 PM

## 2010-05-28 NOTE — Progress Notes (Signed)
Summary: WENT TO [place]/ [facility]  Phone Note Call from Patient Call back at Home Phone [phone]   Caller: Cathi Roan Summary of Call: [facility] PT.  LISA CALLED TO SAY THAT [name] WENT TO [place] YESTERDAY, FOR BACK PAINI. WHILE AT THE HOSPITAL THEY DID AN X-RAY, AND SAYS THAT HE HAS DEGENERATIVE JOINT DISEASE. THEY GAVE HIM A STEROID INJECTION, 6 DAY TREATMENT OF PREDNOSON, 2 DAYS OF PAIN MEDICATION AND MUSCLE RELAXER. LISA SAYS THE DR AT THE ER WANTS TO KNOW IF WE WILL GIVE HIM A REFILL ON HIS PAIN AND MUSCLE RELAXER MEDS UNTIL HE GOES TO [facility] IN [place]. LISA SAYS THAT THE ULTRAM THAT SCOTT PUT HIM ON IS NOT WORKINIG AT ALL. Initial call taken by: Leodis Rains,  February 18, 2010 11:00 AM  Follow-up for Phone Call        Sent to N. Daphine Deutscher.  Dutch Quint RN  February 18, 2010 12:45 PM   Additional Follow-up for Phone Call Additional follow up Details #1::        I need notes from the ER Additional Follow-up by: Lehman Prom FNP,  February 18, 2010 2:19 PM  New Problems: DEGENERATIVE DISC DISEASE, LUMBAR SPINE (ICD-722.52)   Additional Follow-up for Phone Call Additional follow up Details #2::    On your desk.  Dutch Quint RN  February 18, 2010 2:33 PM   reviewed notes - lumbar x-rays do show degeneration. he should keep appt with [facility] pt was started on dilaudid - this provider will NOT refill that narcotic i would advise him to take prednisone taper as ordered will be willing to given him a ONE time rx for vicodin which is stronger than tramadol for pt to take daily as needed for pain. (rx in your basket fax to pt's pharmacy) will not prescribe long term narcotics for the pt so again advise he keep appt with specialist he should also be using lidoderm patch and mobic as previously ordered.  no ONE medication is going to rid him of pain.  he will need to use a combination of meds Follow-up by: Lehman Prom FNP,  February 18, 2010 7:24  PM  Additional Follow-up for Phone Call Additional follow up Details #3:: Details for Additional Follow-up Action Taken: Advised pt.'s sister of all instructions and recommendations of provider.  Notified of Rx.  Verbalized understanding and agreement.  Rx faxed to [facility] per pt. request.  Dutch Quint RN  February 19, 2010 11:02 AM   New Problems: DEGENERATIVE DISC DISEASE, LUMBAR SPINE (ICD-722.52) New/Updated Medications: VICODIN 5-500 MG TABS (HYDROCODONE-ACETAMINOPHEN) One tablet by mouth daily as needed for pain Prescriptions: VICODIN 5-500 MG TABS (HYDROCODONE-ACETAMINOPHEN) One tablet by mouth daily as needed for pain  #30 x 0   Entered and Authorized by:   Lehman Prom FNP   Signed by:   Lehman Prom FNP on 02/18/2010   Method used:   Printed then faxed to ...       [facility]* (retail)       [address]       [place], [place]  [address]       Ph: [phone]       Fax: [phone]   RxID:   [id number] VICODIN 5-500 MG TABS (HYDROCODONE-ACETAMINOPHEN) One tablet by mouth daily as needed for pain  #30 x 0   Entered and Authorized by:   Lehman Prom FNP   Signed by:   Lehman Prom FNP on 02/18/2010   Method used:  Printed then faxed to ...       [facility]* (retail)       [address]       [place], [place]  [address]       Ph: [phone]       Fax: [phone]   RxID:   [id number]    X-ray  Procedure date:  02/17/2010  Findings:      lumbar - no acute fracture. mild multilevel degenerative changes   X-ray  Procedure date:  02/17/2010  Findings:      lumbar - no acute fracture. mild multilevel degenerative changes

## 2010-05-28 NOTE — Progress Notes (Signed)
Summary: REFER  Phone Note Call from Patient Call back at [phone]   Caller: Patient Reason for Call: Talk to Nurse Summary of Call: PT REQ TO BE REFERRED TO A NEUROLOGIST  Initial call taken by: Oscar La,  January 31, 2010 3:38 PM  Follow-up for Phone Call        dr Ewing Schlein requested this precedure... Dr. Ewing Schlein said that the back pain is going on too long and he wants neurologist... Follow-up by: Armenia Shannon,  February 01, 2010 1:58 PM  Additional Follow-up for Phone Call Additional follow up Details #1::        He rec'd only one steroid injection in his back.  He was supposed to call and reschedule. Did he ever do that? I need a copy of the note from Dr. Ewing Schlein faxed to me.  Additional Follow-up by: Tereso Newcomer PA-C,  February 04, 2010 1:45 PM    Additional Follow-up for Phone Call Additional follow up Details #2::    line is busy.... Armenia Shannon  February 04, 2010 3:28 PM Left message on answering machine for pt to call back.Marland KitchenMarland KitchenMarland KitchenArmenia Shannon  February 05, 2010 2:40 PM  MR MCKEES SISTER CALLED, (LISA) AND SAYS THAT KENNY DID GET ONE INJECTION AND IT REALLY DIDN'T HELP, AND DR MAGOD SAYS THAT BEFORE THEY GET INTO DOING MORE INJECTIONS, HE WOULD LIKE FOR HIM TO SEE A NEUROLOGIST.Marland KitchenCala Bradford Tinnin  February 07, 2010 12:05 PM   spoke with receptionist at Dr. Ewing Schlein office and they will send over notes.... Armenia Shannon  February 07, 2010 4:56 PM   Additional Follow-up for Phone Call Additional follow up Details #3:: Details for Additional Follow-up Action Taken: [place], Please notify patient referral is being sent.  Arna Medici, Please refer to Neurosurgery.  Order in system. Tereso Newcomer PA-C  February 08, 2010 2:19 PM  I SEND A REFERRAL TO [facility] TODAY.Marland KitchenCheryll Dessert  February 08, 2010 6:04 PM      Impression & Recommendations:  Problem # 1:  BACK PAIN, LUMBAR (ICD-724.2)  has spinal canal stenosis on MRI attempted ESI he completed x 1 encouraged to go back for more  injections however, has f/u with GI and told he needs to see neurosurgeon now stating the ESI did not help now requesting referral to neurosurgeon will go ahead and refer for eval  His updated medication list for this problem includes:    Tramadol Hcl 50 Mg Tabs (Tramadol hcl) .Marland Kitchen... Take one every 8 hours as needed    Mobic 7.5 Mg Tabs (Meloxicam) .Marland Kitchen... Take 1 tablet by mouth once a day as needed  Orders: Neurosurgeon Referral (Neurosurgeon)  Complete Medication List: 1)  Metoprolol Tartrate 50 Mg Tabs (Metoprolol tartrate) .... One two times a day 2)  Dilantin 100 Mg Caps (Phenytoin sodium extended) .... One capusule by mouth twice daily **pharmacy - note dose change** 3)  Lactulose 10 Gm/36ml Soln (Lactulose) .Marland Kitchen.. 15 ml syrup, once daily to have 3-5 bms per day. 4)  Aldactone 100 Mg Tabs (Spironolactone) .... Take 1/2 tablet by mouth three times a day 5)  Lasix 80 Mg Tabs (Furosemide) .... Take 1 tablet in the morning and 1/2 tablet in the evening 6)  Lidoderm 5 % Ptch (Lidocaine) .... Apply 1-3 patches to area of greatest pain. leave on 12 hours and then remove for 12 hours 7)  Lorazepam 2 Mg Tabs (Lorazepam) .... Take one tablet by mouth three times a day per dr.magod 8)  Levothyroxine Sodium 100 Mcg Tabs (Levothyroxine  sodium) .... One tab in the a.m. 9)  Levothyroxine Sodium 100 Mcg Tabs (Levothyroxine sodium) .Marland Kitchen.. 1 by mouth daily(dr.kerr) 10)  Lidoderm 5 % Ptch (Lidocaine) .... Apply to affected area for up to 12 hours a day as needed for pain (do not wear longer than 12 hrs); pa approved for one year 11)  Folic Acid 1 Mg Tabs (Folic acid) .... Take 1 tablet by mouth once a day 12)  Mirtazapine 15 Mg Tabs (Mirtazapine) .... Take 1 tab by mouth at bedtime (dose increased) 13)  Tramadol Hcl 50 Mg Tabs (Tramadol hcl) .... Take one every 8 hours as needed 14)  Mobic 7.5 Mg Tabs (Meloxicam) .... Take 1 tablet by mouth once a day as needed

## 2010-05-28 NOTE — Letter (Signed)
Summary: Discharge Summary  Discharge Summary   Imported By: Arta Bruce 04/02/2010 10:21:41  _____________________________________________________________________  External Attachment:    Type:   Image     Comment:   External Document

## 2010-05-28 NOTE — Medication Information (Signed)
Summary: [facility]   Imported By: Arta Bruce 03/06/2010 15:46:42  _____________________________________________________________________  External Attachment:    Type:   Image     Comment:   External Document

## 2010-05-28 NOTE — Progress Notes (Signed)
Summary: Side Effects from the medication  Medications Added MIRTAZAPINE 7.5 MG TABS (MIRTAZAPINE) Take 1 tab by mouth at bedtime       Phone Note Call from Patient Call back at [phone]   Summary of Call: The pt has some issues with the new medication he is taking for anti-depression (zolotf).  It causing some side effects as anxiety.  Pt still taking the medication but he feels that if he continue taking the medication he may lose his mind.  [facility] Judithann Sheen)  Alben Spittle PA-c  Initial call taken by: Manon Hilding,  November 13, 2009 8:27 AM  Follow-up for Phone Call        Spoke with pt.'s sister -- confirmed symptoms, states pt. feels like his "skin is crawling", doesn't want to go around people, anxiety is increased.  Denies any other allergic reaction; states this started about a week or so ago.  Pt. is still taking med but wants to stop. Follow-up by: Dutch Quint RN,  November 13, 2009 9:32 AM  Additional Follow-up for Phone Call Additional follow up Details #1::        If he is having a crisis . . . he should go to the ED.  So, if he is suicidal or manic . . .he should go to the ED. He can stop the Zoloft. Try Mirtazapine.  Rx in basket to fax to his pharmacy. He should take it at bedtime.  It will likely help him sleep.  Keep f/u appt with me.  He should have been scheduled 2 weeks after I saw him last. Additional Follow-up by: Brynda Rim,  November 13, 2009 3:05 PM    Additional Follow-up for Phone Call Additional follow up Details #2::    pt does have an appt.... will try new med Follow-up by: Armenia Shannon,  November 13, 2009 4:04 PM  New/Updated Medications: MIRTAZAPINE 7.5 MG TABS (MIRTAZAPINE) Take 1 tab by mouth at bedtime Prescriptions: MIRTAZAPINE 7.5 MG TABS (MIRTAZAPINE) Take 1 tab by mouth at bedtime  #30 x 1   Entered and Authorized by:   Tereso Newcomer PA-C   Signed by:   Tereso Newcomer PA-C on 11/13/2009   Method used:   Printed then faxed to ...  [facility]* (retail)       [address]       [place], [place]  [address]       Ph: [phone]       Fax: [phone]   RxID:   [id number]     Impression & Recommendations:  Problem # 1:  DEPRESSION (ICD-311) patient called with SEs from Zoloft change to mirtazapine  The following medications were removed from the medication list:    Zoloft 50 Mg Tabs (Sertraline hcl) .Marland Kitchen... Take 1 tablet by mouth once a day His updated medication list for this problem includes:    Lorazepam 2 Mg Tabs (Lorazepam) .Marland Kitchen... Take one tablet by mouth three times a day per dr.magod    Mirtazapine 7.5 Mg Tabs (Mirtazapine) .Marland Kitchen... Take 1 tab by mouth at bedtime  Complete Medication List: 1)  Metoprolol Tartrate 50 Mg Tabs (Metoprolol tartrate) .... One two times a day 2)  Dilantin 100 Mg Caps (Phenytoin sodium extended) .... One capusule by mouth twice daily **pharmacy - note dose change** 3)  Lactulose 10 Gm/36ml Soln (Lactulose) .Marland Kitchen.. 15 ml syrup, once daily to have 3-5 bms per day. 4)  Aldactone 100 Mg Tabs (Spironolactone) .... Take 1/2 tablet by mouth three times  a day 5)  Lasix 80 Mg Tabs (Furosemide) .... Take 1/2 tablet by mouth every 12 hours 6)  Lidoderm 5 % Ptch (Lidocaine) .... Apply 1-3 patches to area of greatest pain. leave on 12 hours and then remove for 12 hours 7)  Lorazepam 2 Mg Tabs (Lorazepam) .... Take one tablet by mouth three times a day per dr.magod 8)  Levothyroxine Sodium 100 Mcg Tabs (Levothyroxine sodium) .... One tab in the a.m. 9)  Levothyroxine Sodium 100 Mcg Tabs (Levothyroxine sodium) .Marland Kitchen.. 1 by mouth daily(dr.kerr) 10)  Lidoderm 5 % Ptch (Lidocaine) .... Apply to affected area for up to 12 hours a day as needed for pain (do not wear longer than 12 hrs); pa approved for one year 11)  Folic Acid 1 Mg Tabs (Folic acid) .... Take 1 tablet by mouth once a day 12)  Mirtazapine 7.5 Mg Tabs (Mirtazapine) .... Take 1 tab by mouth at bedtime

## 2010-05-28 NOTE — Medication Information (Signed)
Summary: RX Folder/DRUG UTILIZATION REVIEW  RX Folder/DRUG UTILIZATION REVIEW   Imported By: Arta Bruce 11/06/2009 11:34:58  _____________________________________________________________________  External Attachment:    Type:   Image     Comment:   External Document
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
## 2010-05-28 NOTE — Assessment & Plan Note (Signed)
Summary: 2236 PT/ COLD/CONGESTED///KT   Vital Signs:  Patient profile:   50 year old male Weight:      198.8 pounds Temp:     98.3 degrees F oral Pulse rate:   92 / minute Pulse rhythm:   regular Resp:     20 per minute BP sitting:   140 / 64  (left arm)  Vitals Entered By: Gaylyn Cheers RN (March 01, 2010 2:00 PM) CC:  Back pain radiating down bil legs; nasa lD/C cough Is Patient Diabetic? No Pain Assessment Patient in pain? yes     Location: lower back Intensity: 9 Type: sharp Onset of pain  Constant  Does patient need assistance? Functional Status Self care Ambulation Normal Comments no longer taking: Folic acid, METOPROLOL TARTRATE 50 MG  TABS (METOPROLOL TARTRATE) one two times a day   Primary Care Provider:  Tereso Newcomer PA-C  CC:   Back pain radiating down bil legs; nasa lD/C cough.  History of Present Illness: 1.  Was in hospital for his back 5 days ago when developed a runny nose, sore throat, cough productive of brown phlegm.  Now with a lot of pressure pain over face.  Not much in way of posterior pharyngeal drainage.  Smokes 1 ppd.  Has tried some OTC meds, but cannot think of the name.  2.  Lumbar stenosis:  exacerbated when lifted a lawn mower about 2 weeks ago.  Pain meds not working for him.  Was seen in ED for this 5 days ago.  Xrays were stable.  Was placed on prednisone burst and , Dilaudid.  Was given an Rx for Vicodin on the 24th of October for 30 tabs and had already used.   Pt. states he was getting pain medication (Tramadol) from Dr. Ewing Schlein for his back--he states he had not filled as it is not helping.  Was getting medication at [facility].  To see Dr. Danielle Dess, Neurosurgery on Nov 22.  He is also using Lidoderm patches   Habits & Providers  Alcohol-Tobacco-Diet     Tobacco Status: current  Allergies (verified): No Known Drug Allergies  Physical Exam  General:  NAD.  Appears much older than stated age. Eyes:  No corneal or  conjunctival inflammation noted. EOMI. Perrla. Funduscopic exam benign, without hemorrhages, exudates or papilledema. Vision grossly normal. Ears:  External ear exam shows no significant lesions or deformities.  Otoscopic examination reveals clear canals, tympanic membranes are intact bilaterally without bulging, retraction, inflammation or discharge. Hearing is grossly normal bilaterally. Nose:  Clear discharge, mucosa with mild swelling. Mouth:  pharynx pink and moist.   Neck:  No deformities, masses, or tenderness noted. Lungs:  Normal respiratory effort, chest expands symmetrically. Lungs are clear to auscultation, no crackles or wheezes. Heart:  Normal rate and regular rhythm. S1 and S2 normal without gallop, murmur, click, rub or other extra sounds. Msk:  Tender over bilateral lumbar area.   Impression & Recommendations:  Problem # 1:  DEGENERATIVE DISC DISEASE, LUMBAR SPINE (ICD-722.52) Check with pharmacy to see what he has been filling. Discussed that he should no longer be obtaining pain meds from Dr. Scarlette Calico use primary care or Neurosurgery. Also needs to avoid activities that can exacerbate problem  Problem # 2:  ANEMIA (ICD-285.9) Was to have repeat iron studies His updated medication list for this problem includes:    Folic Acid 1 Mg Tabs (Folic acid) .Marland Kitchen... Take 1 tablet by mouth once a day  Orders: T-Ferritin ([id number]) T-Iron [phone])  T-Iron Binding Capacity (TIBC) ([id number])  Problem # 3:  URI (ICD-465.9) supportive care  Complete Medication List: 1)  Metoprolol Tartrate 50 Mg Tabs (Metoprolol tartrate) .... One two times a day 2)  Dilantin 100 Mg Caps (Phenytoin sodium extended) .... One capusule by mouth twice daily **pharmacy - note dose change** 3)  Lactulose 10 Gm/54ml Soln (Lactulose) .Marland Kitchen.. 15 ml syrup, once daily to have 3-5 bms per day. 4)  Aldactone 100 Mg Tabs (Spironolactone) .... Take 1/2 tablet by mouth three times a day 5)  Lasix 80 Mg  Tabs (Furosemide) .... Take 1 tablet in the morning and 1/2 tablet in the evening 6)  Lidoderm 5 % Ptch (Lidocaine) .... Apply 1-3 patches to area of greatest pain. leave on 12 hours and then remove for 12 hours 7)  Lorazepam 2 Mg Tabs (Lorazepam) .... Take one tablet by mouth three times a day per dr.magod 8)  Levothyroxine Sodium 100 Mcg Tabs (Levothyroxine sodium) .... One tab in the a.m. 9)  Levothyroxine Sodium 100 Mcg Tabs (Levothyroxine sodium) .Marland Kitchen.. 1 by mouth daily(dr.kerr) 10)  Lidoderm 5 % Ptch (Lidocaine) .... Apply to affected area for up to 12 hours a day as needed for pain (do not wear longer than 12 hrs); pa approved for one year 11)  Folic Acid 1 Mg Tabs (Folic acid) .... Take 1 tablet by mouth once a day 12)  Mirtazapine 15 Mg Tabs (Mirtazapine) .... Take 1 tab by mouth at bedtime (dose increased) 13)  Mobic 7.5 Mg Tabs (Meloxicam) .... Take 1 tablet by mouth once a day as needed 14)  Vicodin 5-500 Mg Tabs (Hydrocodone-acetaminophen) .... One tablet by mouth daily as needed for pain  Patient Instructions: 1)  Push fluids 2)  Vaporizer or cool mist humidifier in room when resting/sleepin 3)  Robitussin PE or CF for congestion 4)  Call if you congestion/cough worsens. 5)  Return when feeling better for flu vaccine Prescriptions: VICODIN 5-500 MG TABS (HYDROCODONE-ACETAMINOPHEN) One tablet by mouth daily as needed for pain  #40 x 0   Entered and Authorized by:   Julieanne Manson MD   Signed by:   Julieanne Manson MD on 03/01/2010   Method used:   Print then Give to Patient   RxID:   [id number]    Orders Added: 1)  T-Ferritin [[id number]] 2)  T-Iron [[id number]] 3)  T-Iron Binding Capacity (TIBC) [[address]] 4)  Est. Patient Level IV [54270]

## 2010-05-28 NOTE — Assessment & Plan Note (Signed)
Summary: 2 week fu per [name]//kt   Vital Signs:  Patient profile:   50 year old male Height:      60 inches Weight:      198 pounds BMI:     38.81 Temp:     98.0 degrees F oral Pulse rate:   71 / minute Pulse rhythm:   regular Resp:     18 per minute BP sitting:   118 / 76  (left arm) Cuff size:   large  Vitals Entered By: [name] (November 22, 2009 9:52 AM) CC: f/u... Is Patient Diabetic? No Pain Assessment Patient in pain? no       Does patient need assistance? Functional Status Self care Ambulation Normal   Primary Care Provider:  Tereso Newcomer PA-C  CC:  f/u....  History of Present Illness: 50 year old male returns for followup on depression.  He had been taken off of Lexapro secondary to erectile dysfunction.  I placed him on Zoloft.  He called the office about a week or so later stating he was feeling worse.  This was switched to Remeron.  He is feeling much better with this.  He is sleeping better.  His appetite is picking up.  He denies suicidal ideations.  He denies any further anxiety.  He does note that his erections are better but he does have some problems with maintaining erections.  He is due for a testosterone level with his urologist next week.  I explained to him that he may need his level adjusted.  He should follow up with his urologist.  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 One Two Times A Day 2)  Dilantin 100 Mg  Caps (Phenytoin Sodium Extended) .... One Capusule By Mouth Twice Daily **pharmacy - Note Dose Change** 3)  Lactulose 10 Gm/22ml  Soln (Lactulose) .Marland Kitchen.. 15 Ml Syrup, Once Daily To Have 3-5 Bms Per Day. 4)  Aldactone 100 Mg  Tabs (Spironolactone) .... Take 1/2 Tablet By Mouth Three Times A Day 5)  Lasix 80 Mg  Tabs (Furosemide) .... Take 1/2 Tablet By Mouth Every 12 Hours 6)  Lidoderm 5 %  Ptch (Lidocaine) .... Apply 1-3 Patches To Area of Greatest Pain. Leave On 12 Hours and Then Remove For 12 Hours 7)  Lorazepam 2 Mg  Tabs (Lorazepam) .... Take One Tablet By Mouth Three Times A Day Per Dr.magod 8)  Levothyroxine Sodium 100 Mcg Tabs (Levothyroxine Sodium) .... One Tab in The A.m. 9)  Levothyroxine Sodium 100 Mcg  Tabs (Levothyroxine Sodium) .Marland Kitchen.. 1 By Mouth Daily(Dr.kerr) 10)  Lidoderm 5 % Ptch (Lidocaine) .... Apply To Affected Area For Up To 12 Hours A Day As Needed For Pain (Do Not Wear Longer Than 12 Hrs); Pa Approved For One Year 11)  Folic Acid 1 Mg  Tabs (Folic Acid) .... Take 1 Tablet By Mouth Once A Day 12)  Mirtazapine 7.5 Mg Tabs (Mirtazapine) .... Take 1 Tab By Mouth At Bedtime  Allergies (verified): No Known Drug Allergies  Physical Exam  General:  alert, well-developed, and well-nourished.   Head:  normocephalic and atraumatic.   Neck:  supple.   Lungs:  normal breath sounds.   Heart:  normal rate and regular rhythm.   Neurologic:  alert & oriented X3 and cranial nerves II-XII intact.   Psych:  normally interactive, good eye contact, not anxious appearing, and not depressed appearing.     Impression & Recommendations:  Problem # 1:  DEPRESSION (ICD-311) Assessment Improved improved with mirtazapine mood is better; no further anxiety lost 30 pounds . . . was  not eating appetite better with mirtazapine will see how he does over time with med and appetite  His updated medication list for this problem includes:    Lorazepam 2 Mg Tabs (Lorazepam) .Marland Kitchen... Take one tablet by mouth three times a day per dr.magod    Mirtazapine 7.5 Mg Tabs (Mirtazapine) .Marland Kitchen... Take 1 tab by mouth at bedtime  Problem # 2:  ERECTILE DYSFUNCTION, ORGANIC (ICD-607.84) notes problems with erections recently has appt to check testosterone level soon with urologist advised him to check with urologist . .. .may need testosterone level adjusted  Complete Medication List: 1)  Metoprolol Tartrate 50 Mg Tabs (Metoprolol tartrate) .... One two times a day 2)  Dilantin 100 Mg Caps (Phenytoin sodium extended) .... One capusule by mouth twice daily **pharmacy - note dose change** 3)  Lactulose 10 Gm/52ml Soln (Lactulose) .Marland Kitchen.. 15 ml syrup, once daily to have 3-5 bms per day. 4)  Aldactone 100 Mg Tabs (Spironolactone) .... Take 1/2 tablet by mouth three times a day 5)  Lasix 80 Mg Tabs (Furosemide) .... Take 1/2 tablet by mouth every 12 hours 6)  Lidoderm 5 % Ptch (Lidocaine) .... Apply 1-3 patches to area of greatest pain. leave on 12 hours and then remove for 12 hours 7)  Lorazepam 2 Mg Tabs (Lorazepam) .... Take one tablet by mouth three times a day per dr.magod 8)  Levothyroxine Sodium 100 Mcg Tabs (Levothyroxine sodium) .... One tab in the a.m. 9)  Levothyroxine Sodium 100 Mcg Tabs (Levothyroxine sodium) .Marland Kitchen.. 1 by mouth daily(dr.kerr) 10)  Lidoderm 5 % Ptch (Lidocaine) .... Apply to affected area for up to 12 hours a day as needed for pain (do not wear longer than 12 hrs); pa approved for one year 11)  Folic Acid 1 Mg Tabs (Folic acid) .... Take 1 tablet by mouth once a day 12)  Mirtazapine 7.5 Mg Tabs (Mirtazapine) .... Take 1 tab by mouth at bedtime 13)  Tramadol Hcl 50 Mg Tabs (Tramadol hcl) .... Take one every 8 hours as needed  Patient Instructions: 1)  Continue on the  Mirtazapine for depression. 2)  Follow up with your urologist to see if you need an adjustment in your testosterone. 3)  Please schedule a follow-up appointment in 2 months with Jamielee Mchale for depression.  We will check labs then.  Come fasting so we can check you cholesterol.

## 2010-05-28 NOTE — Progress Notes (Signed)
Summary: lexopro causing problems   Phone Note Call from Patient   Summary of Call: The sister of the pt, Ms. Schubert, called in today because she has a question in reference one of the medication he is taking (lexapro).  Please call her back. Alben Spittle PA-c Initial call taken by: Manon Hilding,  October 16, 2009 10:48 AM  Follow-up for Phone Call        lexopro causing ED, is there some other type of medication he can take? Will forward to S. Christean Silvestri PA [place] RN  October 16, 2009 11:39 AM   Additional Follow-up for Phone Call Additional follow up Details #1::        He has been on this medicine for a long time.  It is possible that it can cause decreased libido.  But with how long he has taken, I would think that any difficulty with erections would be from something else.  It is possible it is related to loss of a testicle from his cancer surgery.  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 If he still sees psychiatry, I would discuss with them whether the med can be changed or the dose adjusted. He should schedule an appt with me to discuss ED further. Additional Follow-up by: Tereso Newcomer PA-C,  October 16, 2009 5:07 PM    Additional Follow-up for Phone Call Additional follow up Details #2::    [facility] put pt on lexapro and he doesn't see them any more... pt is getting appt to see you Follow-up by: Arm[place] Shannon,  October 19, 2009 12:18 PM

## 2010-05-28 NOTE — Progress Notes (Signed)
Summary: Micah Flesher to detox  Phone Note Call from Patient Call back at [facility] Phone [phone] Call back at [phone] (cell)   Caller: Cathi Roan Reason for Call: Talk to Doctor Summary of Call: MS GARRINGER CALLED TO SAYS THAT [facility] FELL OFF THE WAGON AND TOOK HIM A DRINK, HE SIGNED HIMSELF IN TO DETOX ON LAST FRIDAY, AND AT THE DETOX HOSP. THEY CAME TO THE CONCLUSION THAT Rubin HAS BEEN ON ATIVAN SO LONG THAT HE HAS  BECOME IMMUNE TO THEM. DR MAGOD IS THE ONE PRESCRIBED THEM, BUT DR MAGODS OFFICE WANTS [name] TO BE THE TO HANDLE THIS, AND IF WE DONT WANT TO HANDLE THIS, THEN HE WOULD TALK W/HIS DR HERE SO THEY CAN FIGURE OUT WHAT HE CAN BE PUT ON. 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 LISA SAYS THAT WITHIN A WEEK Lynne WILL BE ADMITTED TO A PLACE IN [place] TO GET HELP AND SHE HAS TO GET ALL HIS MEDS TOGETHER AND TAKE WITH HIM. SHE IS TRYING TO GET HIM IN AS SOON AS POSSIBLE. Initial call taken by: Leodis Rains,  March 20, 2010 12:49 PM  Follow-up for Phone Call        Sent to Dr. Delrae Alfred.  What do you need me to do?  Dutch Quint RN  March 20, 2010 1:03 PM   Additional Follow-up for Phone Call Additional follow up Details #1::        The detox center usually handles the medication needed for detox. Would speak to Ms. Dot Lanes to get the number for the detox center to find out if they really do need help with this. I agree that he should not get Ativan from his GI provider and discussed this with the pt. last visit.    Additional Follow-up for Phone Call Additional follow up Details #2::    New labs returned since last addressed this phone note--His anemia has resolved, but would continue to take the iron once daily and folic acid, especially with concern for drinking again.   Please see previous response and questions to clarify as well.  Follow-up by: Julieanne Manson MD,  March 25, 2010 9:54 AM  Additional Follow-up for  Phone Call Additional follow up Details #3:: Details for Additional Follow-up Action Taken: Spoke with sister -- pt. is currently at home after detox -- no room at [facility].  Was at the detox center in [place] on 11/18 until 03/18/10 and then to Behavioral Health until 11/23.  Currently doing "OK" at home.    States that per detox center the Ativan should probably be changed, since his issues are all related to anxiety and the Ativan isn't working at optimal levels; if the med changes and his anxiety can get under control, he should do much better.  When he goes into crowds, he becomes very anxious; anxiety is much worse since his mother passed this year.  Is doing better since his antidepressant was changed, but still has anxiety issues.  Advised of lab results and need to continue iron and folic acid -- verbalized understanding.  She will call back to make a f/u appt. for pt.  Copy of hospital discharge on your desk.  Dutch Quint RN  March 26, 2010 9:41 AM Did he not follow up with psych subsequent to detox?  Have his sister call the [facility] and see about getting him set up there. If they cannot, he can get set up with an appt. with  Marchelle Folks and she can help out with getting him to psych.  I will write and order for Marchelle Folks in case that is what he chooses.  Spoke with sister, Arjan had seen a psychologist in past. She will call and see if they can see him. If any problems needs to contact us. Gaylyn Cheers RN  April 04, 2010 4:53 PM   Additional Follow-up by: Julieanne Manson MD,  April 03, 2010 7:21 PM

## 2010-05-28 NOTE — Letter (Signed)
Summary: [facility]   Imported By: Arta Bruce 11/13/2009 15:23:33  _____________________________________________________________________  External Attachment:    Type:   Image     Comment:   External Document

## 2010-05-28 NOTE — Letter (Signed)
Summary: *HSN Results Follow up  [facility]  [address] [place], [place] [address]   Phone: [phone]  Fax: [phone]      12/28/2009   [name] [place] [address] [place], [place]  [address]   Dear  Mr. [name],                            ____S.Drinkard,FNP   ____D. Gore,FNP       ____B. McPherson,MD   ____V. Rankins,MD    ____E. Mulberry,MD    ____N. Daphine Deutscher, FNP  ____D. Reche Dixon, MD    ____K. Philipp Deputy, MD    __x__S. Alben Spittle, PA-C     This letter is to inform you that your recent test(s):  _______Pap Smear    ___x____Lab Test     _______X-ray    ___x____ is within acceptable limits  _______ requires a medication change  _______ requires a follow-up lab visit  _______ requires a follow-up visit with your provider   Comments:       _________________________________________________________ If you have any questions, please contact our office                     Sincerely,  Tereso Newcomer PA-C [facility]

## 2010-05-28 NOTE — Letter (Signed)
Summary: [facility]PROGRESS NOTES//DR. MAGOD  [facility]PROGRESS NOTES//DR. MAGOD   Imported By: Arta Bruce 05/09/2009 15:55:46  _____________________________________________________________________  External Attachment:    Type:   Image     Comment:   External Document

## 2010-05-28 NOTE — Medication Information (Signed)
Summary: RX Folder/PA  RX Folder/PA   Imported By: Arta Bruce 06/21/2009 10:44:58  _____________________________________________________________________  External Attachment:    Type:   Image     Comment:   External Document

## 2010-05-28 NOTE — Progress Notes (Signed)
   Phone Note Outgoing Call   Summary of Call: Please ask patient if he still takes robaxin. If so, how often does he take it? Initial call taken by: Tereso Newcomer PA-C,  October 26, 2009 4:25 PM  Follow-up for Phone Call        Spoke with pt. sister -- states pt. is not taking Robaxin at all.  Dutch Quint RN  October 26, 2009 4:30 PM

## 2010-05-28 NOTE — Letter (Signed)
Summary: REQUESTING RECORDS FROM [facility]  REQUESTING RECORDS FROM [facility]   Imported By: Silvio Pate Stanislawscyk 06/06/2009 14:53:56  _____________________________________________________________________  External Attachment:    Type:   Image     Comment:   External Document

## 2010-05-28 NOTE — Progress Notes (Signed)
Summary: BACK PAIN NEED MEDS   Phone Note Call from Patient   Summary of Call: PT  GI OFFICE SAYING THAT HE NEED A PAIN MEDICINE FOR HIS BACK  PHARMACY #. [phone]  IF YOU CAN PLEASE CALL GI SPECIALIST AND LET HER KNOW    [phone]  PT HAVE AN APPT WITH THE NEUROLOGIST 03-19-10   Initial call taken by: Cheryll Dessert,  February 13, 2010 2:25 PM  Follow-up for Phone Call        dr. Ewing Schlein said he does not prescribe pain meds for back pain and for pt to contact us... pt does have appt to see neuro but its three weeks away... dr Ewing Schlein office would like for [name] to call them back once we decide if we are or not going to give him a pain med for his back... spoke with Arna Medici and the appt is not til NOV. 22 Follow-up by: [place] Shannon,  February 13, 2010 4:31 PM  Additional Follow-up for Phone Call Additional follow up Details #1::        Dr. Ewing Schlein gave him mobic once, but it was not intended to be used long term due to his liver problems.  I got him approved for lidocaine patches several mos ago.  Is he still using them??  If not, he should get these refilled and try them first.  Tramadol has been given to him in the past by Dr. Ewing Schlein.  I can refill that for him.  However, it has a potential interaction with his depression medicine.  He should not be taking this on a daily basis.   Narcotics are not a good idea for him and he has been in agreement with that in the past. Please find out when he last had tramadol filled.  If 30 days or more, you may call in: Tramadol 50 mg 1 by mouth three times a day as needed for severe pain, #30, no refills.  Notify Dr. Ewing Schlein that we will handle his pain medications. Tereso Newcomer PA-C  February 13, 2010 4:46 PM  Additional Follow-up by: Tereso Newcomer PA-C,  February 13, 2010 4:46 PM    Additional Follow-up for Phone Call Additional follow up Details #2::    spoke with pt sister and she said the tramadol and ultram is not working is there anything else... [place]  Shannon  February 13, 2010 4:58 PM sister says he has tried lidoderm patches and that does not help either.Marland KitchenMarland KitchenArmenia Shannon  February 13, 2010 5:01 PM  Next step is narcotics and I'm not willing to start those meds. Realize that lidoderm patches may not totally releive pain but he may need to alternate tramadol and patches. there is likely not going to be ONE thing that eliminates pain.  If specialist will not prescribe anything for pain then his other option is referral to the pain clinic (as i see he has medicaid) n.martin,fnp  February 13, 2010  6:41 PM   Additional Follow-up for Phone Call Additional follow up Details #3:: Details for Additional Follow-up Action Taken: Left message on answering machine for pt to call back.Marland KitchenMarland KitchenMarland KitchenArmenia Shannon  February 14, 2010 9:11 AM  [phone] [place].. will call med into pharmacy... lidoderm patches and tramadol.... [place] Shannon  February 14, 2010 9:55 AM

## 2010-05-28 NOTE — Progress Notes (Signed)
Summary: questions regarding other MD'S  Phone Note Outgoing Call   Call placed by: Gaylyn Cheers RN Reason for Call: Get patient information Summary of Call: Left message on answer machine for pt. to return call. Need to know why he is seeing [facility] Dr. Noelle Penner     Follow-up for Phone Call        Spoke with sister. Jasim had cut his hand and they thought they were going to have to repair a tendon but did not. He is no longer seeing him. Follow-up by: Gaylyn Cheers RN,  March 04, 2010 10:43 AM  Additional Follow-up for Phone Call Additional follow up Details #1::        Remind pt. that he should not get pain meds elsewhere from now on ( and his last fill here will be the last until he sees Neurosurgery)--once he has established with Neurosurgery, they will determine when he gets pain meds.  If he has an injury requiring narcotics, to let us know--and who is prescribing. Additional Follow-up by: Julieanne Manson MD,  March 05, 2010 8:58 AM    Additional Follow-up for Phone Call Additional follow up Details #2::    line is busy.. [place] Shannon .March 05, 2010 2:26 PM the line is busy ...Marland KitchenMarland KitchenArmenia Shannon  March 06, 2010 2:47 PM  Sister aware and understands she needs to contact us immediately if he gets narcotics from any doctor. Neuro will determine his pain medication needs.  Follow-up by: Gaylyn Cheers RN,  March 07, 2010 11:53 AM

## 2010-05-28 NOTE — Progress Notes (Signed)
Summary: APPT   Phone Note Call from Patient   Summary of Call: PT TO COME IN MONDAY FOR BP CHECK AND PULSE CHECK.  Initial call taken by: Mikey College CMA,  May 11, 2009 11:52 AM

## 2010-05-28 NOTE — Miscellaneous (Signed)
Summary: MRI of abdomen with contrast 03/19/2009   Clinical Lists Changes  Observations: Added new observation of MRIABDOMEN: 1. 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 2. Signal abnromality most apparent adjacent the gallbladder is likely related to an area of focal confluent hepatic fibrosis. 3. 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 (03/19/2009 20:45)      MRI of Abdomen  Procedure date:  03/19/2009  Findings:      1. 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 2. Signal abnromality most apparent adjacent the gallbladder is likely related to an area of focal confluent hepatic fibrosis. 3. 

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
## 2010-05-28 NOTE — Medication Information (Signed)
Summary: RX Folder//LIBODERM  RX Folder//LIBODERM   Imported By: Arta Bruce 07/16/2009 15:46:09  _____________________________________________________________________  External Attachment:    Type:   Image     Comment:   External Document

## 2010-05-28 NOTE — Letter (Signed)
Summary: REFERRAL//RADIOLOGY//APPT DATE &TIME  REFERRAL//RADIOLOGY//APPT DATE &TIME   Imported By: Arta Bruce 10/31/2009 15:47:46  _____________________________________________________________________  External Attachment:    Type:   Image     Comment:   External Document

## 2010-05-28 NOTE — Progress Notes (Signed)
Summary: Office Visit//DEPRESSION SCREENING  Office Visit//DEPRESSION SCREENING   Imported By: Arta Bruce 11/01/2009 15:05:04  _____________________________________________________________________  External Attachment:    Type:   Image     Comment:   External Document

## 2010-05-28 NOTE — Letter (Signed)
Summary: REFERRAL/PHYSICAL THERAPY//BACK PAIN/LUMBAR  REFERRAL/PHYSICAL THERAPY//BACK PAIN/LUMBAR   Imported By: Arta Bruce 06/04/2009 12:47:42  _____________________________________________________________________  External Attachment:    Type:   Image     Comment:   External Document

## 2010-05-28 NOTE — Letter (Signed)
Summary: [facility] /OFFICE VISIT  [facility] /OFFICE VISIT   Imported By: Arta Bruce 02/11/2010 14:32:39  _____________________________________________________________________  External Attachment:    Type:   Image     Comment:   External Document

## 2010-05-28 NOTE — Letter (Signed)
Summary: MED/SOLUTIONS//APPROVED  MED/SOLUTIONS//APPROVED   Imported By: Arta Bruce 08/13/2009 10:54:36  _____________________________________________________________________  External Attachment:    Type:   Image     Comment:   External Document

## 2010-05-28 NOTE — Progress Notes (Signed)
   Phone Note From Other Clinic   Summary of Call: pt has an order is system to have steriod injections.. pt is aware and would like appt on Wed. or Thurs. Initial call taken by: Armenia Shannon,  Aug 27, 2009 4:59 PM

## 2010-05-28 NOTE — Letter (Signed)
Summary: [facility]/GASTROENTEROLOGY,ENDOSCOPY  [facility]/GASTROENTEROLOGY,ENDOSCOPY   Imported By: Arta Bruce 02/11/2010 12:22:29  _____________________________________________________________________  External Attachment:    Type:   Image     Comment:   External Document

## 2010-05-28 NOTE — Letter (Signed)
Summary: [facility] //OFFICE VISIT  [facility] //OFFICE VISIT   Imported By: Arta Bruce 02/01/2010 14:21:17  _____________________________________________________________________  External Attachment:    Type:   Image     Comment:   External Document

## 2010-05-28 NOTE — Miscellaneous (Signed)
  Clinical Lists Changes  Medications: Added new medication of MOBIC 7.5 MG TABS (MELOXICAM) Take 1 tablet by mouth once a day as needed

## 2010-05-28 NOTE — Letter (Signed)
Summary: GI NOTES  GI NOTES   Imported By: Arta Bruce 07/04/2009 15:55:57  _____________________________________________________________________  External Attachment:    Type:   Image     Comment:   External Document

## 2010-05-28 NOTE — Letter (Signed)
Summary: ONCOLOGY NOTES  ONCOLOGY NOTES   Imported By: Arta Bruce 07/31/2009 15:38:01  _____________________________________________________________________  External Attachment:    Type:   Image     Comment:   External Document
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
## 2010-05-29 ENCOUNTER — Encounter: Payer: Self-pay | Admitting: Internal Medicine

## 2010-05-29 ENCOUNTER — Telehealth (INDEPENDENT_AMBULATORY_CARE_PROVIDER_SITE_OTHER): Payer: Self-pay | Admitting: Internal Medicine

## 2010-05-30 NOTE — Letter (Signed)
Summary: *Referral Letter  [facility]  [address] [place], [place] [address]   Phone: [phone]  Fax: [phone]    05/01/2010  Thank you in advance for agreeing to see my patient:  [name] [address] [place], [place]  [address]  Phone: [phone]  Reason for Referral: History of alcoholism, depression and anxiety, chronic back pain.  Recently underwent detox for alcohol after being sober for 5 years.  Feel he needs his psychiatric health further addressed.  Recently weaned off benzodiazepines following hospital stay for ETOH detox.   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) 11)  SCREENING FOR MLIG NEOP, BREAST, NOS (ICD-V76.10) 12)  S/P ERCP W/SPHNCTROTOMY/PAPILLOTOMY (CPT-43262) 13)  BACK PAIN, LUMBAR (ICD-724.2) 14)  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 4)  ALDACTONE 100 MG  TABS (SPIRONOLACTONE) Take 1/2 tablet by mouth three times a day 5)  LASIX 80 MG   TABS (FUROSEMIDE) Take 1 tablet in the morning and 1/2 tablet in the evening 6)  LIDODERM 5 %  PTCH (LIDOCAINE) Apply 1-3 patches to area of greatest pain. Leave on 12 hours and then remove for 12 hours 7)  LEVOTHYROXINE SODIUM 100 MCG TABS (LEVOTHYROXINE SODIUM) one tab in the a.m. 8)  LEVOTHYROXINE SODIUM 100 MCG  TABS (LEVOTHYROXINE SODIUM) 1 by mouth daily(Dr.Kerr) 9)  LIDODERM 5 % PTCH (LIDOCAINE) apply to affected area for up to 12 hours a day as needed for pain (do not wear longer than 12 hrs); PA approved for one year 10)  FOLIC ACID 1 MG TABS (FOLIC ACID) Take 1 tablet by mouth once a day 11)  MIRTAZAPINE 15 MG TABS (MIRTAZAPINE) Take 1 tab by mouth at bedtime (dose increased) 12)  MOBIC 7.5 MG TABS (MELOXICAM) Take 1 tablet by mouth once a day as needed 13)  VICODIN 5-500 MG TABS (HYDROCODONE-ACETAMINOPHEN) One tablet by mouth daily as needed for pain 14)  FERROUS SULFATE 325 (65 FE) MG TABS (FERROUS SULFATE) 1 tab by mouth daily   Past Medical History: 1)  Current Problems:  2)  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  testicular seminoma 4)       b.  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  h/o MRI in past; records unavailable 10)       b.  pain meds limited with cirrhosis; limited tylenol; no  NSAIDs; tramadol not good idea with sz hx and SSRI; no narcotics with past hx.; Lidoderm patches and PT rec. 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  secondary to ETOH 14)     b.  ascites on diuretic therapy 15)     c.  Dr. Julieta Gutting (Ms. Brayton Mars, [place]) at [facility]  16)     d.  can access records at:  [email or web address];  Enter  confirmation code [id number] 26)     e.  2010: patient told to not come back to [facility]; not a transplant candidate for 5 yrs. due to CA. 18)     f.  spoke with Dr. Ewing Schlein (GI in [place]) 04/19/2009:  patient basically stable from cirrhosis standpt; needs q 1 year f/u with GI or sooner as needed; LFTs should be done q 6 mos.; no need to continue beta blocker from GI standpt (no varices noted at last EGD per Dr. Ewing Schlein); PAIN CONTROL:  avoid NSAIDs, limited use of tylenol; limit use with narcotics with tylenol in it; tramadol ok if pt. 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  seen by GI (Dr. Ewing Schlein) 09/2009; EGD + colo planned   Prior History of Blood Transfusions:   Pertinent Labs:    Thank you again for agreeing to see our patient; please contact us if you have any further questions or need additional information.  Sincerely,  Julieanne Manson MD

## 2010-05-30 NOTE — Progress Notes (Signed)
Summary: Neurosurgery, pain clinic, [name] and psych referrals.  Phone Note Outgoing Call   Summary of Call: [name]--please check with [name]--see if any psychiatrist in [place] area he can go to with Medicaid for treatment of anxiety. Also, needs pain clinic referral and need to notify [facility] that [name]. not doing any better, we are sending him to pain clinic and [name]--does he have a follow up appt. with them? Initial call taken by: Justin Manson MD,  May 01, 2010 12:34 PM  Follow-up for Phone Call        I SEND THE REFERRAL TO [facility]INIC [facility]  WAITING FOR AN APPT  I CALL [name] [facility] I SCHEDULE AN APPT 05-06-10 @ 2:00PM I BEEN TRYING TO GET IN CONTACT WITH THE [name] BUT THE PH# IS BUSY  [name] IS OUT SHE WILL BE HERE TOMORROW I ASK HER . I CALL [facility] TOLD ME THAT THE [name] DON'T HAVE A F/U APPT AND THEY TRANSFER ME TO A VOICE MAIL LVM WITH [name]'S NURSE ABOUT THE [name] REFERRALS AND THAT HE IS NOT DOING BETTER.[name]  May 02, 2010 4:06 PM *I ASK [name] TODAY ABOUT PSIQUIATRIC CLINICS IN [place] SHE GIVE ME N/A I WILL TRY BACK ON MONDAY.Marland KitchenCheryll Cook  May 03, 2010 4:56 PM I KEEP TRYING TO CALL AGAIN AND NOTHING SO I GOGGLE MYSELF AND I FOUND [facility] 1800 [phone] I SPOKE TO [name] HE SAID THAT HE WILL CALL [name] TO SCHEDULE AN APPT BECAUSE HE IS ADULT HE CAN'T DO IT THRU ME AND [facility] WILL CALL ME BACK WITH Justin APPT  Follow-up by: [name],  May 06, 2010 12:47 PM  Additional Follow-up for Phone Call Additional follow up Details #1::        [name] FROM [facility] CALL ME AND TOLD ME THAT [name]'S PHONE IS BEEN BUSY SO HE WANTS ME TO CALL HIM AND TOLD HIM TO CALL HIM BACK.[name]  May 06, 2010 4:19 PM I CALL AND I TALK TO [name] [name] SHE IS GOING TO CALL [facility] TO SHEDULE AN APPT .Marland KitchenCheryll Cook  May 06, 2010 4:22 PM. I CALL [name] TO FIND OUT IF [name] CALL HIM TO SCHEDULE AN APPT HE SAID NO AND I CALL [name]  AND SHE SAID SHE WILL CALL AFTER LUNCH AND SHE WILL CALLME BACK TO GIVE ME THE APPT DATE & TIME   Additional Follow-up by: [name],  May 07, 2010 11:27 AM

## 2010-05-30 NOTE — Assessment & Plan Note (Signed)
Summary: hospital f/u//ss   Vital Signs:  Patient profile:   50 year old male Weight:      204.38 pounds Temp:     98.2 degrees F oral Pulse rate:   78 / minute Pulse rhythm:   regular Resp:     20 per minute BP sitting:   124 / 80  (left arm) Cuff size:   regular  Vitals Entered By: Hale Drone CMA (May 01, 2010 11:15 AM) CC: f/u from hosp. Pt. was shaking and feeling nauses before going to ER. Having back pain.  Is Patient Diabetic? No Pain Assessment Patient in pain? yes     Location: lower back Intensity: 9 Type: sharp Onset of pain  Constant  Does patient need assistance? Functional Status Self care Ambulation Normal   Primary Care Provider:  Tereso Newcomer PA-C  CC:  f/u from hosp. Pt. was shaking and feeling nauses before going to ER. Having back pain. Marland Kitchen  History of Present Illness: 1.  Lumbosacral back pain with stenosis:    Was seen by PA at Dr. Danielle Dess, [place] office.  Recommended a translaminar epidural steroid injection at L4-5.  Was done at [facility] end of last week, and pt. without any improvement whatsoever.  He is not aware of a follow up appt. at [facility] at this point.  [facility] program modalities for PT recommended as well--pt. states would be better to undergo PT in [place] as he lives closer.  Has never been to a pain clinic.  2.  Anxiety issues:  pt's anxiety not controlled with benzodiazepines.  States he started drinking again because he could not get comfortable with back pain.  Underwent Librium taper after discharge from hospitalization and was not to restart benzos thereafter.  3.  Alcohol Treatment:  Pt. just went to detox.  States he does not feel like he needs treatment.  " I quit on my own before for 5 years--do not need."    Current Medications (verified): 1)  Metoprolol Tartrate 50 Mg  Tabs (Metoprolol Tartrate) .... One Two Times A Day 2)  Dilantin 100 Mg  Caps (Phenytoin Sodium Extended) .... One Capusule By Mouth Twice Daily  **pharmacy - Note Dose Change** 3)  Lactulose 10 Gm/29ml  Soln (Lactulose) .Marland Kitchen.. 15 Ml Syrup, Once Daily To Have 3-5 Bms Per Day. 4)  Aldactone 100 Mg  Tabs (Spironolactone) .... Take 1/2 Tablet By Mouth Three Times A Day 5)  Lasix 80 Mg  Tabs (Furosemide) .... Take 1 Tablet in The Morning and 1/2 Tablet in The Evening 6)  Lidoderm 5 %  Ptch (Lidocaine) .... Apply 1-3 Patches To Area of Greatest Pain. Leave On 12 Hours and Then Remove For 12 Hours 7)  Lorazepam 2 Mg  Tabs (Lorazepam) .... Take One Tablet By Mouth Three Times A Day Per Dr.magod 8)  Levothyroxine Sodium 100 Mcg Tabs (Levothyroxine Sodium) .... One Tab in The A.m. 9)  Levothyroxine Sodium 100 Mcg  Tabs (Levothyroxine Sodium) .Marland Kitchen.. 1 By Mouth Daily(Dr.kerr) 10)  Lidoderm 5 % Ptch (Lidocaine) .... Apply To Affected Area For Up To 12 Hours A Day As Needed For Pain (Do Not Wear Longer Than 12 Hrs); Pa Approved For One Year 11)  Folic Acid 1 Mg Tabs (Folic Acid) .... Take 1 Tablet By Mouth Once A Day 12)  Mirtazapine 15 Mg Tabs (Mirtazapine) .... Take 1 Tab By Mouth At Bedtime (Dose Increased) 13)  Mobic 7.5 Mg Tabs (Meloxicam) .... Take 1 Tablet By Mouth Once  A Day As Needed 14)  Vicodin 5-500 Mg Tabs (Hydrocodone-Acetaminophen) .... One Tablet By Mouth Daily As Needed For Pain 15)  Ferrous Sulfate 325 (65 Fe) Mg Tabs (Ferrous Sulfate) .Marland Kitchen.. 1 Tab By Mouth Daily  Allergies (verified): No Known Drug Allergies  Physical Exam  General:  Up and down from chair in discomfort from back Lungs:  Normal respiratory effort, chest expands symmetrically. Lungs are clear to auscultation, no crackles or wheezes. Heart:  Normal rate and regular rhythm. S1 and S2 normal without gallop, murmur, click, rub or other extra sounds. Msk:  Tender over lumbosacral spinous processes.  Able to get up on exam table easily.  Neurologic:  strength normal in all extremities and DTRs symmetrical and normal.     Impression & Recommendations:  Problem # 1:   DEPRESSION (ICD-311) and anxiety Discussed do not want him on benzodiazepines--referring to psych for this and  need for improved control of anxiety Try to get in [place] The following medications were removed from the medication list:    Lorazepam 2 Mg Tabs (Lorazepam) .Marland Kitchen... Take one tablet by mouth three times a day per dr.magod His updated medication list for this problem includes:    Mirtazapine 15 Mg Tabs (Mirtazapine) .Marland Kitchen... Take 1 tab by mouth at bedtime (dose increased)  Problem # 2:  DEGENERATIVE DISC DISEASE, LUMBAR SPINE (ICD-722.52) Need follow up plan from [facility] Will also refer to pain clinic Referral to recommended PT --[employer] issues better.  Orders: Physical Therapy Referral (PT) Pain Clinic Referral (Pain)  Problem # 3:  ABUSE, ALCOHOL, IN REMISSION (ICD-305.03) Recent need for detox--pt denies need for help with this. Spent 30 minutes face to face with pt. today discussing issues, exam and plan Orders: Psychiatric Referral (Psych)  Complete Medication List: 1)  Metoprolol Tartrate 50 Mg Tabs (Metoprolol tartrate) .... One two times a day 2)  Dilantin 100 Mg Caps (Phenytoin sodium extended) .... One capusule by mouth twice daily **pharmacy - note dose change** 3)  Lactulose 10 Gm/16ml Soln (Lactulose) .Marland Kitchen.. 15 ml syrup, once daily to have 3-5 bms per day. 4)  Aldactone 100 Mg Tabs (Spironolactone) .... Take 1/2 tablet by mouth three times a day 5)  Lasix 80 Mg Tabs (Furosemide) .... Take 1 tablet in the morning and 1/2 tablet in the evening 6)  Lidoderm 5 % Ptch (Lidocaine) .... Apply 1-3 patches to area of greatest pain. leave on 12 hours and then remove for 12 hours 7)  Levothyroxine Sodium 100 Mcg Tabs (Levothyroxine sodium) .... One tab in the a.m. 8)  Levothyroxine Sodium 100 Mcg Tabs (Levothyroxine sodium) .Marland Kitchen.. 1 by mouth daily(dr.kerr) 9)  Lidoderm 5 % Ptch (Lidocaine) .... Apply to affected area for up to 12 hours a day as needed for  pain (do not wear longer than 12 hrs); pa approved for one year 10)  Folic Acid 1 Mg Tabs (Folic acid) .... Take 1 tablet by mouth once a day 11)  Mirtazapine 15 Mg Tabs (Mirtazapine) .... Take 1 tab by mouth at bedtime (dose increased) 12)  Mobic 7.5 Mg Tabs (Meloxicam) .... Take 1 tablet by mouth once a day as needed 13)  Vicodin 5-500 Mg Tabs (Hydrocodone-acetaminophen) .... One tablet by mouth daily as needed for pain 14)  Ferrous Sulfate 325 (65 Fe) Mg Tabs (Ferrous sulfate) .Marland Kitchen.. 1 tab by mouth daily  Patient Instructions: 1)  Follow up with Dr. Delrae Alfred in 3 months --back pain and anxiety/depression Prescriptions: VICODIN 5-500 MG TABS (HYDROCODONE-ACETAMINOPHEN) One  tablet by mouth daily as needed for pain  #40 x 0   Entered and Authorized by:   Julieanne Manson MD   Signed by:   Julieanne Manson MD on 05/01/2010   Method used:   Print then Give to Patient   RxID:   [id number] LIDODERM 5 % PTCH (LIDOCAINE) apply to affected area for up to 12 hours a day as needed for pain (do not wear longer than 12 hrs); PA approved for one year  #1 mo supply x 3   Entered and Authorized by:   Julieanne Manson MD   Signed by:   Julieanne Manson MD on 05/01/2010   Method used:   Print then Give to Patient   RxID:   [id number]    Orders Added: 1)  Physical Therapy Referral [PT] 2)  Psychiatric Referral [Psych] 3)  Pain Clinic Referral [Pain] 4)  Est. Patient Level IV [[address]]   Immunization History:  Influenza Immunization History:    Influenza:  fluvax 3+ (04/15/2010)   Immunization History:  Influenza Immunization History:    Influenza:  Fluvax 3+ (04/15/2010)

## 2010-05-30 NOTE — Letter (Signed)
Summary: Discharge Summary  Discharge Summary   Imported By: Arta Bruce 05/01/2010 16:10:48  _____________________________________________________________________  External Attachment:    Type:   Image     Comment:   External Document

## 2010-05-30 NOTE — Letter (Signed)
Summary: [facility]//FAXED  [facility]//FAXED   Imported By: Arta Bruce 05/14/2010 15:38:52  _____________________________________________________________________  External Attachment:    Type:   Image     Comment:   External Document

## 2010-05-30 NOTE — Letter (Signed)
Summary: PHYSICIAN DOCUMENTATION SHEET  PHYSICIAN DOCUMENTATION SHEET   Imported By: Arta Bruce 05/01/2010 16:09:35  _____________________________________________________________________  External Attachment:    Type:   Image     Comment:   External Document

## 2010-05-30 NOTE — Progress Notes (Signed)
Summary: Back injection has not worked  Advice worker from Patient   Summary of Call: PT WENT TO BACK DR TO GET SHOT IN SPINE //ASHOT DID NOT HELP HAS NOT SLEPT IN 3 DAYS //DR SAID IF THIS SHOT DIDN'T HELP/THAT IT WAS NO NEED TO COME BACK BECAUSE IT WOULD BE A WASTE OF TIME .SUPPOSE TO SEE DR MAN DR WAS INSTEAD SAW ASSITANT.TOOK 3 MONTHS TO GET THIS AAPPT AND BACK IS GETTING WORST .Marland Kitchen CANCAL THAT RUNS [facility] AND IT IS SWOLLEN AND HITTING THE NERVES  /NEEDS SOMETHING FOR PAIN AND WOULD LIKE TO SEE ANOTHER SPINE AND BACK DR.Marland KitchenPHARMACY [place]///[phone] Initial call taken by: Arta Bruce,  April 30, 2010 10:05 AM  Follow-up for Phone Call        Spoke with [name] (sister) - pt. was sent to [facility], who sent him to [facility] 04/25/10 and he received a back injection.   If ineffective, they didn't recommend any more injections.  Says that injection has not helped his pain.  States was to get in touch with [name] if his injection did not work, so that he could be referred to a neurosurgeon or any other orthopedic or "back doctor".   Has appt. rescheduled for 05/01/10 -- wanted Dr. Delrae Alfred to have this information before appointment. Follow-up by: Dutch Quint RN,  April 30, 2010 4:22 PM  Additional Follow-up for Phone Call Additional follow up Details #1::        Did her ever actually see someone at [facility]?  Appears he no showed end of November.  I do not see any correspondence from them regarding the pt., but will look again--check with Velna Hatchet as to whether she has seen anything from them on Mr. Bottger Additional Follow-up by: Julieanne Manson MD,  April 30, 2010 6:45 PM

## 2010-05-30 NOTE — Letter (Signed)
Summary: [facility]  [facility]   Imported By: Arta Bruce 05/01/2010 16:06:49  _____________________________________________________________________  External Attachment:    Type:   Image     Comment:   External Document

## 2010-05-30 NOTE — Letter (Signed)
Summary: *HSN Results Follow up  [facility]  [address] [place], [place] [address]   Phone: [phone]  Fax: [phone]      04/23/2010   [name] [address] [place], [place]  [address]   Dear  [name],                           Comments: WE HAVE BEEN TRYING TO REACH YOU PLEASE,CALL [phone] AND ASK FOR KRISTIE RODRIGUEZ TO SCHEDULE AN APPT FOR PSYCHOLOGY  THANK YOU AND HAVE A NICE DAY       _________________________________________________________ If you have any questions, please contact our office                     Sincerely,  Cheryll Dessert [facility]

## 2010-05-30 NOTE — Letter (Signed)
Summary: *Referral Letter  [facility]  [address] [place], [place] [address]   Phone: [phone]  Fax: [phone]    05/01/2010  Thank you in advance for agreeing to see my patient:  [name] [address] [place], [place]  [address]  Phone: [phone]  Reason for Referral:  Lumbar stenosis with radicular pain.  Has had 2 epidural corticosteroid injections recently without improvement.  Most recently saw a PA at [facility]. Referring also for PT that was recommended by the PA.  Finally, referring to psychiatry and hopefully pt. will go--for depression and anxiety. He states he recently binged on alcohol after being dry for 5 years secondary to inability to sleep with the pain.  Recently titrated off benzodiazepines following alcohol detox hospital stay.  Plan is to keep him off.   Procedures Requested: EValuation and recommendations for treatment.  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) 11)  SCREENING FOR MLIG NEOP, BREAST, NOS (ICD-V76.10) 12)  S/P ERCP W/SPHNCTROTOMY/PAPILLOTOMY (CPT-43262) 13)  BACK PAIN, LUMBAR (ICD-724.2) 14)  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 4)  ALDACTONE 100 MG  TABS (SPIRONOLACTONE) Take 1/2 tablet by mouth three times a day 5)  LASIX 80 MG  TABS (FUROSEMIDE) Take 1 tablet in the morning and 1/2 tablet in the evening 6)  LIDODERM 5 %  PTCH (LIDOCAINE) Apply 1-3 patches to area of greatest pain. Leave on 12 hours and then remove for 12 hours 7)  LEVOTHYROXINE SODIUM 100 MCG TABS (LEVOTHYROXINE SODIUM) one tab in the a.m. 8)  LEVOTHYROXINE SODIUM 100 MCG  TABS (LEVOTHYROXINE SODIUM) 1 by mouth daily(Dr.Kerr) 9)  LIDODERM 5 % PTCH (LIDOCAINE) apply to affected area for up to 12 hours a day as needed for pain (do not wear longer than 12 hrs); PA approved for one year 10)  FOLIC ACID 1 MG TABS (FOLIC ACID) Take 1 tablet by mouth once a day 11)  MIRTAZAPINE 15 MG TABS (MIRTAZAPINE) Take 1 tab by mouth at bedtime (dose increased) 12)  MOBIC 7.5 MG TABS (MELOXICAM) Take 1 tablet by mouth once a day as needed 13)  VICODIN 5-500 MG TABS (HYDROCODONE-ACETAMINOPHEN) One tablet by mouth daily as needed for pain 14)  FERROUS SULFATE 325 (65 FE) MG TABS (FERROUS SULFATE) 1 tab by mouth daily   Past Medical History: 1)  Current Problems:  2)  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  testicular seminoma 4)       b.  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  h/o MRI in past; records unavailable 10)       b.  pain meds limited with cirrhosis; limited tylenol; no NSAIDs; tramadol not good idea with sz hx and SSRI; no narcotics with past hx.; Lidoderm patches and PT rec. 04/25/2009 11)  OTHER POSTABLATIVE  HYPOTHYROIDISM (ICD-244.1) 12)  CIRRHOSIS, LIVER NOS (ICD-571.5) 13)     a.  secondary to ETOH 14)     b.  ascites on diuretic therapy 15)     c.  Dr. Julieta Gutting (Ms. Brayton Mars, [place]) at [facility]  16)     d.  can access records at:  [email or web address];  Enter confirmation code [id number] 3)     e.  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 due to CA. 18)     f.  spoke with Dr. Ewing Schlein (GI in [place]) 04/19/2009:  patient basically stable from cirrhosis standpt; needs q 1 year f/u with GI or sooner as needed; LFTs should be done q 6 mos.; no need to continue beta blocker from GI standpt (no varices noted at last EGD per Dr. Ewing Schlein); PAIN CONTROL:  avoid NSAIDs, limited use of tylenol; limit use with narcotics with tylenol in it; tramadol ok if pt. 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  seen by GI (Dr. Ewing Schlein) 09/2009; EGD + colo planned   Prior History of Blood Transfusions:   Pertinent Labs:    Thank you again for agreeing to see our patient; please contact us if you have any further questions or need additional information.  Sincerely,  Julieanne Manson MD

## 2010-06-05 NOTE — Assessment & Plan Note (Signed)
Summary: Hosp. f/u    Vital Signs:  Patient profile:   50 year old male Weight:      210.50 pounds BMI:     41.26 Temp:     97.0 degrees F oral Pulse rate:   84 / minute Pulse rhythm:   regular Resp:     20 per minute BP sitting:   142 / 80  (left arm)  Vitals Entered By: Hale Drone CMA (May 29, 2010 10:22 AM) CC: F/u from [facility] hospt for lower back pain. Was prescribed Hydrocodone and Prednisone. The pain is worse when walking or doing any physical activity. Dr[facility] Th[facility] [place] is not able to assist him anymore. Dr. Thayer Ohm wants him to see a nuerosurgeon aswell as the physicians at the [facility].  Is Patient Diabetic? No Pain Assessment Patient in pain? yes     Location: lower back Intensity: 9 Type: sharp Onset of pain  Constant  Does patient need assistance? Functional Status Self care Ambulation Normal   Primary Care Provider:  Tereso Newcomer PA-C  CC:  F/u from [facility] h[facility] for lower back pain. Was prescribed Hydrocodone and Prednisone. The pain is worse when walking or doing any physical activity. Dr. Thayer Ohm at the [facility] in [place] is not able to assist him anymore. Dr. Thayer Ohm wants him to see a nuerosurgeon aswell as the physicians at the [facility]. Marland Kitchen  History of Present Illness: 1.  Chronic back pain with lumbar stenosis: Spoke with Dr. Letta Moynahan office, pain mgmt.  They do not prescribe pain medications until the pt. has been seen for at least 2-3 visits, and may not take over that part of his care at all.  He does have a follow up with them--they have not said there is nothing more they can do for him--in fact, have scheduled him for PNCV/EMG of LE and following his recent epidural injection on the 25th of Jan, were discussing the possibility of radiofrequeny procedures, implantation devices based on response to injection. I also spoke with the pt's provider at [facility], [place], who works with Dr. Danielle Dess.  She recommended working with pt.  on smoking cessation and to reschedule pt. with them for reevaluation as he is not getting pain relief for consideration of surgical solution.   Pt. continues with PT  2.  Depression/Anxiety/Alcoholism:  Pt. has not set up appt. with psychiatrist, Dr. Omelia Blackwater, in [place] as he thought the provider was just a Veterinary surgeon.  I called the office--he is indeed a psychiatrist and encouraged pt. to call and get set up with them.  They do not take referrals--pt. must set up.  Current Medications (verified): 1)  Metoprolol Tartrate 50 Mg  Tabs (Metoprolol Tartrate) .... One Two Times A Day 2)  Dilantin 100 Mg  Caps (Phenytoin Sodium Extended) .... One Capusule By Mouth Twice Daily **pharmacy - Note Dose Change** 3)  Lactulose 10 Gm/63ml  Soln (Lactulose) .Marland Kitchen.. 15 Ml Syrup, Once Daily To Have 3-5 Bms Per Day. 4)  Aldactone 100 Mg  Tabs (Spironolactone) .... Take 1/2 Tablet By Mouth Three Times A Day 5)  Lasix 80 Mg  Tabs (Furosemide) .... Take 1 Tablet in The Morning and 1/2 Tablet in The Evening 6)  Lidoderm 5 %  Ptch (Lidocaine) .... Apply 1-3 Patches To Area of Greatest Pain. Leave On 12 Hours and Then Remove For 12 Hours 7)  Levothyroxine Sodium 100 Mcg Tabs (Levothyroxine Sodium) .... One Tab in The A.m. 8)  Levothyroxine Sodium 100 Mcg  Tabs (Levothyroxine Sodium) .Marland Kitchen.. 1 By Mouth Daily(Dr.kerr) 9)  Lidoderm 5 % Ptch (Lidocaine) .... Apply To Affected Area For Up To 12 Hours A Day As Needed For Pain (Do Not Wear Longer Than 12 Hrs); Pa Approved For One Year 10)  Folic Acid 1 Mg Tabs (Folic Acid) .... Take 1 Tablet By Mouth Once A Day 11)  Mirtazapine 15 Mg Tabs (Mirtazapine) .... Take 1 Tab By Mouth At Bedtime (Dose Increased) 12)  Mobic 7.5 Mg Tabs (Meloxicam) .... Take 1 Tablet By Mouth Once A Day As Needed 13)  Vicodin 5-500 Mg Tabs (Hydrocodone-Acetaminophen) .... One Tablet By Mouth Daily As Needed For Pain 14)  Ferrous Sulfate 325 (65 Fe) Mg Tabs (Ferrous Sulfate) .Marland Kitchen.. 1 Tab By Mouth Daily 15)   Prednisone 10 Mg Tabs (Prednisone) .... 6 Tabs 1st Day, 5 Tabs 2nd Day, 4 Tabs 3rd Day, 3 Tabs 4th Day and 2 Tabs The 5th Day - [facility] 16)  Hydrocodone-Acetaminophen 5-325 Mg Tabs (Hydrocodone-Acetaminophen) .Marland Kitchen.. 1-2 Tabs By Mouth Every 4-6 Hours As Needed For Pain -- [facility]  Allergies (verified): No Known Drug Allergies  Physical Exam  General:  Sitting forward secondary to pain Msk:  Tender over lumbar spinous processes and paraspinous musculature Neurologic:  Mildly decreased left patellar reflex.  Motor strength in legs 5/5   Impression & Recommendations:  Problem # 1:  DEGENERATIVE DISC DISEASE, LUMBAR SPINE (ICD-722.52) Called Dr. Letta Moynahan office and spoke with office person:  See discussion in HPI after receiving notes. He does not prescribe meds the first 3 visits.  Not clear that he will do so as well.  They are sending his office notes. Called [facility] and spoke with Christena Flake, PA:  they will see him back and decide if there is a surgical treatment that will likely help.  He needs to quit smoking. He is to continue with PT Patches written to quit smoking He is to call Dr. Lilia Pro office --he is psychiatry, no psychology and can write meds--he will call  Problem # 2:  DEPRESSION (ICD-311) As in HPI His updated medication list for this problem includes:    Mirtazapine 15 Mg Tabs (Mirtazapine) .Marland Kitchen... Take 1 tab by mouth at bedtime (dose increased)  Complete Medication List: 1)  Metoprolol Tartrate 50 Mg Tabs (Metoprolol tartrate) .... One two times a day 2)  Dilantin 100 Mg Caps (Phenytoin sodium extended) .... One capusule by mouth twice daily **pharmacy - note dose change** 3)  Lactulose 10 Gm/42ml Soln (Lactulose) .Marland Kitchen.. 15 ml syrup, once daily to have 3-5 bms per day. 4)  Aldactone 100 Mg Tabs (Spironolactone) .... Take 1/2 tablet by mouth three times a day 5)  Lasix 80 Mg Tabs (Furosemide) .... Take 1 tablet in the morning and 1/2 tablet in the evening 6)  Lidoderm 5  % Ptch (Lidocaine) .... Apply 1-3 patches to area of greatest pain. leave on 12 hours and then remove for 12 hours 7)  Levothyroxine Sodium 100 Mcg Tabs (Levothyroxine sodium) .... One tab in the a.m. 8)  Levothyroxine Sodium 100 Mcg Tabs (Levothyroxine sodium) .Marland Kitchen.. 1 by mouth daily(dr.kerr) 9)  Lidoderm 5 % Ptch (Lidocaine) .... Apply to affected area for up to 12 hours a day as needed for pain (do not wear longer than 12 hrs); pa approved for one year 10)  Folic Acid 1 Mg Tabs (Folic acid) .... Take 1 tablet by mouth once a day 11)  Mirtazapine 15 Mg Tabs (Mirtazapine) .Marland KitchenMarland KitchenMarland Kitchen  Take 1 tab by mouth at bedtime (dose increased) 12)  Mobic 7.5 Mg Tabs (Meloxicam) .... Take 1 tablet by mouth once a day as needed 13)  Ferrous Sulfate 325 (65 Fe) Mg Tabs (Ferrous sulfate) .Marland Kitchen.. 1 tab by mouth daily 14)  Prednisone 10 Mg Tabs (Prednisone) .... 6 tabs 1st day, 5 tabs 2nd day, 4 tabs 3rd day, 3 tabs 4th day and 2 tabs the 5th day - [facility] 15)  Hydrocodone-acetaminophen 5-325 Mg Tabs (Hydrocodone-acetaminophen) .Marland Kitchen.. 1 tab by mouth two times a day for back pain-must last 30 days 16)  Nicotine 21 Mg/24hr Pt24 (Nicotine) .Marland Kitchen.. 1 patch to new area of skin changed daily for 4 weeks, then decrease to the 14 mg patches 17)  Nicotine 14 Mg/24hr Pt24 (Nicotine) .... Apply to new area of skin-change daily for 14 days.  then decrease to 7 mg patches 18)  Nicotine 7 Mg/24hr Pt24 (Nicotine) .... Apply to new area of skin-change daily.  stop after 14 days  Patient Instructions: 1)  Follow up with Dr. Delrae Alfred in 3 months for back pain, smoking cessation 2)  Quit smoking the first day you start the patches--start 21 mg x 4 w, then 14 mg for 2 w, then 7 mg for 2 weeks. 3)  Work on losing abdominal weight 4)  Stick with PT, pain clinic and we will get you back in with [facility] Prescriptions: HYDROCODONE-ACETAMINOPHEN 5-325 MG TABS (HYDROCODONE-ACETAMINOPHEN) 1 tab by mouth two times a day for back pain-must last 30 days  #40 x  0   Entered and Authorized by:   Julieanne Manson MD   Signed by:   Julieanne Manson MD on 05/29/2010   Method used:   Print then Give to Patient   RxID:   [id number] NICOTINE 7 MG/24HR PT24 (NICOTINE) apply to new area of skin-change daily.  Stop after 14 days  #14 x 0   Entered and Authorized by:   Julieanne Manson MD   Signed by:   Julieanne Manson MD on 05/29/2010   Method used:   Electronically to        [facility]* (retail)       [address]       [place], [place]  [address]       Ph: [phone]       Fax: [phone]   RxID:   [id number] NICOTINE 14 MG/24HR PT24 (NICOTINE) apply to new area of skin-change daily for 14 days.  Then decrease to 7 mg patches  #14 x 0   Entered and Authorized by:   Julieanne Manson MD   Signed by:   Julieanne Manson MD on 05/29/2010   Method used:   Electronically to        [facility]* (retail)       [address]       [place], [place]  [address]       Ph: [phone]       Fax: [phone]   RxID:   [id number] NICOTINE 21 MG/24HR PT24 (NICOTINE) 1 patch to new area of skin changed daily for 4 weeks, then decrease to the 14 mg patches  #30 x 0   Entered and Authorized by:   Julieanne Manson MD   Signed by:   Julieanne Manson MD on 05/29/2010   Method used:   Electronically to        [facility]* (retail)       [address]       [place], [place]  [address]  Ph: [phone]       Fax: [phone]   RxID:   [id number]    Orders Added: 1)  Est. Patient Level III [62952]

## 2010-06-05 NOTE — Progress Notes (Signed)
Summary: NEED MED FOR BACK PAIN  Phone Note Call from Patient   Reason for Call: Talk to Nurse Summary of Call: pt wants to know can he have something for pain.....   [facility] Initial call taken by: [name],  May 16, 2010 10:00 AM  Follow-up for Phone Call        [name] CALLED YESTERDAY, TO SEE IF DR Lilyana Lippman WILL CALL SOMETHING IN FOR HIS BACK PAIN, HE SAW THE DR THIS PAST WEDNESDAY AND HE HE SCHEDULED TO FO BACK NEXT WEDNESDAY FOR HIS INJECTION, BUT IN THE MEANTIME CAN HE GET SOMETHING FOR HIS PAIN, BECAUSE IF NOT, HE WILL HAVE TO TO GO TO THE ER.  YOU CAN CALL HIM AT [phone]. Follow-up by: [name],  May 17, 2010 11:38 AM  Additional Follow-up for Phone Call Additional follow up Details #1::        I just filled his pain med on the 4th of January.  Was wanting that to last the month.   He is going through the pain medication too fast. I'm assuming the injection is with [facility]. Has he heard yet from psych, PT, or the pain clinic yet? Additional Follow-up by: Julieanne Manson MD,  May 20, 2010 11:06 AM    Additional Follow-up for Phone Call Additional follow up Details #2::    line is busy.Marland KitchenMarland KitchenMarland KitchenArmenia Cook  May 20, 2010 3:17 PM  Left message on answering machine for pt to call back.Marland KitchenMarland KitchenMarland KitchenArmenia Cook  May 22, 2010 9:29 AM   pt went yesterday to pain clinic in [place] and the gave him injection.... Dr. Metta Clines ([facility] [phone]) says we need to handle the pain med....  pt has started PT... sister says he already has psych.  Dr. Ignatius Specking...  Can we get the info from the pain clinic stating that we need to handle the pain medication?  Julieanne Manson MD  May 27, 2010 1:54 PM   Additional Follow-up for Phone Call Additional follow up Details #3:: Details for Additional Follow-up Action Taken: spoke with the nurse and she said pt has been there twice... dr. crisp says  he is not modifing meds at this time and pt  has another appt on  2/28 nurse said that dr crisp might give meds than but she is unsure because dr crisp is already gone for today...  she says if we do give pt meds please send over information about the med that was given to pt...[phone] (F) pt's mrn [phone].Marland KitchenArmenia Cook  May 27, 2010 4:17 PM I would prefer that they handle all of treatments/meds relating to pain control--would like to know first, if Dr. Metta Clines  is willing to do this, and if he is, will it be delayed until the next visit?  If so, I will fill the Rx one more time on the 4th of February and then leave it up to Dr. Metta Clines thereafter. Julieanne Manson MD  May 28, 2010 10:06 AM  See next phone note. Pt. in today for back pain

## 2010-06-05 NOTE — Progress Notes (Signed)
Summary: rerefer--[facility]  Phone Note Outgoing Call   Summary of Call: Nora--can you set Mr. Ehle back up with Dr. Verlee Rossetti office Initial call taken by: Julieanne Manson MD,  May 29, 2010 11:31 AM  Follow-up for Phone Call        I SEND THE REFERRAL TO [facility]  WAITING FOR AN APPT Follow-up by: Cheryll Dessert,  May 30, 2010 4:31 PM

## 2010-06-05 NOTE — Letter (Signed)
Summary: [facility]  [facility]   Imported By: Arta Bruce 05/31/2010 11:20:07  _____________________________________________________________________  External Attachment:    Type:   Image     Comment:   External Document

## 2010-06-05 NOTE — Letter (Signed)
Summary: [facility]  [facility]   Imported By: Arta Bruce 05/31/2010 11:27:23  _____________________________________________________________________  External Attachment:    Type:   Image     Comment:   External Document

## 2010-06-05 NOTE — Progress Notes (Signed)
Summary: [name] WANTS [name] TO DO THE REF  Phone Note Call from Patient Call back at Home Phone [phone] Call back at [phone] CELL   Reason for Call: Referral Summary of Call: [name] PT. [name] GOT A CALL FROM DR CRISP TODAY AND HE WANTS HIM TO SEE A NEUROLOGIST, AND [name] WANTS TOKNOW IF WE CAN DO THE REF. INSTEAD IN HIM COMING IN THIS WEDNESDAY TO SEE [name]. HE ALSO SAYS THAT HE RECOMMENDED THAT HE GO TO THE HOSP. TODAY.   [name] WOULD LIKE FOR [name] TO CALL HIM IF DR Erza Mothershead SAYS HE DOESN'T HAVE TO COME THIS WED. Initial call taken by: Leodis Rains,  May 27, 2010 3:29 PM  Follow-up for Phone Call        Dr. Metta Clines has D/C pt from pain clinic-pt states he said there is nothing else he can do for him. Dr. Metta Clines advised pt to go to the urgent care today for pain control. He also told pt he should be referred to a neurologist. Pt. has an appt. with you on Wed. to get referral however wants to know if he has to come to see you for that? Follow-up by: Gaylyn Cheers RN,  May 27, 2010 4:01 PM  Additional Follow-up for Phone Call Additional follow up Details #1::        We've got 2 separate phone notes going about the same thing--making this more convoluted. I need the documentation from Dr. Letta Moynahan office before I can do anything for [name].  Please call Dr. Letta Moynahan office and ask for the last visit records and recommendations.  Please see the previous phone note regarding this as well--will forward both to [facility] Additional Follow-up by: Julieanne Manson MD,  May 28, 2010 10:09 AM    Additional Follow-up for Phone Call Additional follow up Details #2::    Records requested earlier today as of 4:15 have not received. Office called closed @ 4:00 pm Left message on answer machine for pt. to return call. Pt. needs to wait until we have received records before Dr. Delrae Alfred can proceed with plan of care. Gaylyn Cheers RN  May 28, 2010 4:22 PM Pt. in today to discuss back  pain

## 2010-06-09 ENCOUNTER — Emergency Department (HOSPITAL_COMMUNITY): Payer: Medicaid Other

## 2010-06-09 ENCOUNTER — Inpatient Hospital Stay (HOSPITAL_COMMUNITY)
Admission: EM | Admit: 2010-06-09 | Discharge: 2010-06-13 | DRG: 897 | Disposition: A | Discharge status: Home / Self Care | Payer: Medicaid Other | Attending: Internal Medicine | Admitting: Internal Medicine

## 2010-06-09 DIAGNOSIS — K7030 Alcoholic cirrhosis of liver without ascites: Secondary | ICD-10-CM | POA: Diagnosis present

## 2010-06-09 DIAGNOSIS — F329 Major depressive disorder, single episode, unspecified: Secondary | ICD-10-CM | POA: Diagnosis present

## 2010-06-09 DIAGNOSIS — E039 Hypothyroidism, unspecified: Secondary | ICD-10-CM | POA: Diagnosis present

## 2010-06-09 DIAGNOSIS — E871 Hypo-osmolality and hyponatremia: Secondary | ICD-10-CM | POA: Diagnosis present

## 2010-06-09 DIAGNOSIS — K644 Residual hemorrhoidal skin tags: Secondary | ICD-10-CM | POA: Diagnosis present

## 2010-06-09 DIAGNOSIS — W010XXA Fall on same level from slipping, tripping and stumbling without subsequent striking against object, initial encounter: Secondary | ICD-10-CM | POA: Diagnosis present

## 2010-06-09 DIAGNOSIS — F10229 Alcohol dependence with intoxication, unspecified: Secondary | ICD-10-CM | POA: Diagnosis present

## 2010-06-09 DIAGNOSIS — F10239 Alcohol dependence with withdrawal, unspecified: Principal | ICD-10-CM | POA: Diagnosis present

## 2010-06-09 DIAGNOSIS — S1093XA Contusion of unspecified part of neck, initial encounter: Secondary | ICD-10-CM | POA: Diagnosis present

## 2010-06-09 DIAGNOSIS — S0003XA Contusion of scalp, initial encounter: Secondary | ICD-10-CM | POA: Diagnosis present

## 2010-06-09 DIAGNOSIS — F3289 Other specified depressive episodes: Secondary | ICD-10-CM | POA: Diagnosis present

## 2010-06-09 DIAGNOSIS — C6290 Malignant neoplasm of unspecified testis, unspecified whether descended or undescended: Secondary | ICD-10-CM | POA: Diagnosis present

## 2010-06-09 DIAGNOSIS — Y92009 Unspecified place in unspecified non-institutional (private) residence as the place of occurrence of the external cause: Secondary | ICD-10-CM

## 2010-06-09 DIAGNOSIS — R188 Other ascites: Secondary | ICD-10-CM | POA: Diagnosis present

## 2010-06-09 DIAGNOSIS — F10939 Alcohol use, unspecified with withdrawal, unspecified: Principal | ICD-10-CM | POA: Diagnosis present

## 2010-06-09 LAB — URINALYSIS, ROUTINE W REFLEX MICROSCOPIC
Ketones, ur: NEGATIVE mg/dL
Nitrite: NEGATIVE
Specific Gravity, Urine: 1.007 (ref 1.005–1.030)
Urine Glucose, Fasting: NEGATIVE mg/dL
pH: 5.5 (ref 5.0–8.0)

## 2010-06-09 LAB — CBC
HCT: 42.9 % (ref 39.0–52.0)
MCH: 34.1 pg — ABNORMAL HIGH (ref 26.0–34.0)
MCV: 91.5 fL (ref 78.0–100.0)
RDW: 13.8 % (ref 11.5–15.5)
WBC: 9.8 10*3/uL (ref 4.0–10.5)

## 2010-06-09 LAB — RAPID URINE DRUG SCREEN, HOSP PERFORMED
Barbiturates: NOT DETECTED
Benzodiazepines: POSITIVE — AB
Cocaine: NOT DETECTED
Opiates: NOT DETECTED

## 2010-06-09 LAB — COMPREHENSIVE METABOLIC PANEL
Albumin: 4.1 g/dL (ref 3.5–5.2)
BUN: 10 mg/dL (ref 6–23)
Calcium: 9.1 mg/dL (ref 8.4–10.5)
Glucose, Bld: 103 mg/dL — ABNORMAL HIGH (ref 70–99)
Potassium: 3.8 mEq/L (ref 3.5–5.1)
Total Protein: 8 g/dL (ref 6.0–8.3)

## 2010-06-09 LAB — ETHANOL: Alcohol, Ethyl (B): 352 mg/dL — ABNORMAL HIGH (ref 0–10)

## 2010-06-09 LAB — GLUCOSE, CAPILLARY: Glucose-Capillary: 110 mg/dL — ABNORMAL HIGH (ref 70–99)

## 2010-06-09 LAB — APTT: aPTT: 30 seconds (ref 24–37)

## 2010-06-09 LAB — PROTIME-INR: INR: 0.94 (ref 0.00–1.49)

## 2010-06-09 LAB — AMMONIA: Ammonia: 25 umol/L (ref 11–35)

## 2010-06-10 DIAGNOSIS — F1010 Alcohol abuse, uncomplicated: Secondary | ICD-10-CM

## 2010-06-10 LAB — CARDIAC PANEL(CRET KIN+CKTOT+MB+TROPI)
CK, MB: 0.7 ng/mL (ref 0.3–4.0)
Troponin I: 0.01 ng/mL (ref 0.00–0.06)

## 2010-06-11 DIAGNOSIS — F1010 Alcohol abuse, uncomplicated: Secondary | ICD-10-CM

## 2010-06-11 LAB — CBC
HCT: 41.7 % (ref 39.0–52.0)
HCT: 43.4 % (ref 39.0–52.0)
Hemoglobin: 15.8 g/dL (ref 13.0–17.0)
Platelets: 102 10*3/uL — ABNORMAL LOW (ref 150–400)
Platelets: 107 10*3/uL — ABNORMAL LOW (ref 150–400)
RBC: 4.41 MIL/uL (ref 4.22–5.81)
RBC: 4.56 MIL/uL (ref 4.22–5.81)
RDW: 13.9 % (ref 11.5–15.5)
WBC: 7.2 10*3/uL (ref 4.0–10.5)
WBC: 8.8 10*3/uL (ref 4.0–10.5)
WBC: 8 10*3/uL (ref 4.0–10.5)

## 2010-06-11 LAB — BASIC METABOLIC PANEL
CO2: 30 mEq/L (ref 19–32)
Calcium: 9.4 mg/dL (ref 8.4–10.5)
GFR calc Af Amer: >60 mL/min (ref >60)
GFR calc non Af Amer: >60 mL/min (ref >60)
Potassium: 4.4 mEq/L (ref 3.5–5.1)
Sodium: 137 mEq/L (ref 135–145)

## 2010-06-12 DIAGNOSIS — F1010 Alcohol abuse, uncomplicated: Secondary | ICD-10-CM

## 2010-06-12 LAB — CBC
HCT: 42.2 % (ref 39.0–52.0)
Hemoglobin: 15 g/dL (ref 13.0–17.0)
MCH: 33.8 pg (ref 26.0–34.0)
MCV: 95 fL (ref 78.0–100.0)
RBC: 4.44 MIL/uL (ref 4.22–5.81)

## 2010-06-12 LAB — BASIC METABOLIC PANEL
BUN: 20 mg/dL (ref 6–23)
CO2: 29 mEq/L (ref 19–32)
Chloride: 92 mEq/L — ABNORMAL LOW (ref 96–112)
Creatinine, Ser: 1.25 mg/dL (ref 0.4–1.5)
GFR calc Af Amer: >60 mL/min (ref >60)

## 2010-06-13 LAB — BASIC METABOLIC PANEL
CO2: 30 mEq/L (ref 19–32)
Chloride: 94 mEq/L — ABNORMAL LOW (ref 96–112)
Creatinine, Ser: 1.17 mg/dL (ref 0.4–1.5)
GFR calc Af Amer: >60 mL/min (ref >60)

## 2010-06-16 NOTE — Consult Note (Signed)
[name], BUCHTA NO.:  [id number]  MEDICAL RECORD NO.:  [id number]           PATIENT TYPE:  E  LOCATION:  WLED                         FACILITY:  [facility]  PHYSICIAN:  Eulogio Ditch, MD DATE OF BIRTH:  03/18/1961  DATE OF CONSULTATION: DATE OF DISCHARGE:                                CONSULTATION   REASON FOR CONSULTATION:  Depressed mood and alcohol abuse.  HISTORY OF PRESENT ILLNESS:  This is a 50 year old male with a history of alcohol dependence, came for detox from alcohol.  The patient reported depressed mood but denied any suicidal or homicidal ideation. He is very logical and goal directed during the interview.  Denies hearing any voices, is not delusional.  The patient told me that he was sober for 5 years but then he relapsed because of the financial issues, difficulties with his relationship with the girlfriend and also has difficulty coping with the death of his mother who passed away 5 months ago.  PAST PSYCHIATRIC HISTORY:  The patient has a history of getting detox at [facility] in 2011.  PAST MEDICAL HISTORY:  The patient has a history of: 1. Cancer of testes. 2. Cirrhosis of the liver. 3. Hypothyroidism. 4. Seizure disorder. 5. The patient told me that he is having blood in the stool.  ALLERGIES:  HE IS ALLERGIC TO TYLENOL.  PSYCHIATRIC MEDICATIONS:  The patient is on mirtazapine 15 mg at bedtime.  MENTAL STATUS EXAMINATION:  The patient is calm, cooperative during interview.  Fair eye contact.  The patient has tremors in both his hands.  Speech is normal.  Mood is depressed, anxious.  Affect mood congruent.  Thought process logical and goal directed.  Thought content not suicidal or homicidal, not delusional.  Thought perception, no audiovisual hallucination reported, not internally preoccupied. Cognition, alert, awake, oriented x 3.  Memory, immediate, recent, remote intact.  Attention, concentration fair.   Abstraction ability good.  Funds of knowledge fair.  Insight and judgment intact.  DIAGNOSES:  AXIS I:  Chronic alcohol dependence, depressive disorder, not otherwise specified. AXIS II:  Deferred. AXIS III:  See medical notes. AXIS IV:  Psychosocial stressors as discussed in HPI. AXIS V:  40 to 50.  RECOMMENDATIONS: 1. I spoke with Dr. Rito Ehrlich in the ED and discussed that this patient     needs medical admission, as he has lot of medical problems and he     can be easily detoxed in the medical unit. 2. He is also complaining of blood in the stool, so it will be better     for him to be admitted to the medical floor. 3. I will follow up on this patient on the medical floor.  At this     time, he can be continued on mirtazapine 15 mg     at bedtime. 4. I started the patient on Ativan detox protocol. 5. The ED doctor agreed with the plan for admission to the medical     floor.     Eulogio Ditch, MD     SA/MEDQ  D:  06/10/2010  T:  06/10/2010  Job:  [id number]  Electronically Signed by Eulogio Ditch  on 06/16/2010 06:08:46 AM

## 2010-06-18 ENCOUNTER — Telehealth (INDEPENDENT_AMBULATORY_CARE_PROVIDER_SITE_OTHER): Payer: Self-pay | Admitting: Internal Medicine

## 2010-06-20 ENCOUNTER — Other Ambulatory Visit (HOSPITAL_COMMUNITY): Payer: Self-pay

## 2010-06-25 ENCOUNTER — Ambulatory Visit: Payer: Self-pay | Admitting: Pain Medicine

## 2010-06-25 ENCOUNTER — Encounter (INDEPENDENT_AMBULATORY_CARE_PROVIDER_SITE_OTHER): Payer: Self-pay | Admitting: Internal Medicine

## 2010-06-25 NOTE — Discharge Summary (Signed)
[name], [name] NO.:  [id number]  MEDICAL RECORD NO.:  [id number]           PATIENT TYPE:  I  LOCATION:  1513                         FACILITY:  [facility]  PHYSICIAN:  Marinda Elk, M.D.DATE OF BIRTH:  July 16, 1960  DATE OF ADMISSION:  06/09/2010 DATE OF DISCHARGE:  06/13/2010                              DISCHARGE SUMMARY   PRIMARY CARE DOCTOR:  [facility].  GASTROENTEROLOGIST:  Petra Kuba, MD  UROLOGIST:  Sigmund I. Patsi Sears, MD  DISCHARGE DIAGNOSES: 1. Alcohol withdrawal. 2. Rectal bleeding. 3. Hyponatremia. 4. History of seizure. 5. Ascites. 6. Hypothyroidism.  DISCHARGE MEDICATIONS: 1. Folic acid 1 tablet daily. 2. __________ 100 mg p.o. b.i.d. for 5 days. 3. MiraLax 17 g daily. 4. Thiamine 100 mg daily. 5. Amaryl 30 mg on topical daily. 6. Lasix 20 mg 2 tablets b.i.d. 7. Lactulose 15 mg t.i.d. 8. Synthroid 100 mcg daily. 9. Lorazepam 2 mg t.i.d. 10.Mirtazapine 15 mg at bedtime. 11.Anusol 100 mg b.i.d. 12.Spironolactone 100 mg daily. 13.Vicodin 5/500 one tablet q.4 h. p.r.n.  PROCEDURES PERFORMED:  CT of the head that showed no acute intracranial abnormalities.  There is a scalp contusion hematoma adjacent to the left supraorbital rim, 7-mm metallic foreign body within the preseptal soft tissue of the right orbit.  Chest x-ray showed cardiomegaly with no acute findings.  Spine x-ray showed no acute fractures.  CONSULTS:  Eulogio Ditch, MD, Psychiatry.  BRIEF ADMITTING HISTORY AND PHYSICAL:  This is a 50 year old man with a history of alcohol cirrhosis.  He was clean for 5 years and on the transplant list.  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  In very detail, he was preparing to come to the hospital for detox, once again, he fell in the shower secondary to being intoxicated and sustained a hematoma over his left eye,  so we were asked to admit him to further evaluate.  The patient's affect is depressed and he talked to his mother who died a year ago and his friend just lost prior to that.  However, he denies any suicidal thoughts but he is very inebriated.  PHYSICAL EXAMINATION:  VITAL SIGNS:  Temperature 98, pulse of 86, respirations 16, blood pressure 145/70. HEENT:  Normocephalic.  He has a lesion of 1-2 inches over the left eyebrow, otherwise his head is atraumatic.  Nose, there is no nasal discharge with no bleeding.  Mouth, mucosa moist with no blood. NECK:  No thyromegaly.  No lymphadenopathy. LUNGS:  Good air movement.  Clear to auscultation. CARDIOVASCULAR:  He has a regular rate and rhythm with positive S1 and S2.  No murmurs, rubs or gallops. ABDOMEN:  Positive bowel sounds, nontender, nondistended, soft. EXTREMITIES:  Positive pulses.  No clubbing, cyanosis, or edema. NEUROLOGIC:  Nonfocal.  Please refer further details to dictation of June 10, 2010.  LABORATORY FINDINGS:  On admission; hemoglobin of 16, baseline 14; whitecount of 9.8; platelet count 158.  INR 0.9, sodium 127, potassium 3.8, bicarb of 23, glucose of 93, BUN of 10, creatinine 0.8, glucose of 103.  LFTs within normal limits.  Ammonia of 25.  Alcohol level 352. Phenytoin 2.7.  UDS was positive for benzo.  UA, no infection.  ASSESSMENT AND PLAN: 1. Alcohol abuse.  He was admitted to the floor, was started on Ativan     protocol.  This lasted 2 days.  Then this was stopped as his     symptoms has resolved.  It was discussed with him that need to go     to [facility] but he did not want to go to [facility].  After extensive discussion with him he has agreed.     Psychiatry was consulted and they recommended transfer to     [facility]. 2. Rectal bleeding, probably hemorrhoids.  His hemoglobin has remained     stable throughout his hospital stay.  He has had no further     bleeding.  His last  hemoglobin of 15.  His previous hemoglobin was     14, so no further treatment. 3. Hyponatremia, that is probably secondary to alcohol abuse.  This     has resolved.  His sodium on day of discharge was 135.  This is     probably secondary to combination of alcohol abuse and dehydration. 4. History of seizures, none. 5. Ascites, secondary to cirrhosis, currently stable.  No changes. 6. Hypothyroidism.  He will continue his Synthroid.  No changes were     made.  DISPOSITION:  The patient will go to [facility] for further management of his alcohol dependence and probable depressive disorder, not otherwise specified.  VITAL SIGNS ON DAY OF DISCHARGE:  Temperature 97, pulse 67, respirations 18, blood pressure 145/83.  He was satting 97% on room air.  LABORATORY FINDINGS ON DAY OF DISCHARGE:  Sodium 135, potassium 3.5, chloride 94, bicarb of 30, glucose of 104, BUN of 18, creatinine of 0.1 and calcium of 9.2.  His mag is 2.1.     Marinda Elk, M.D.     AF/MEDQ  D:  06/13/2010  T:  06/13/2010  Job:  [id number]  Electronically Signed by Lambert Keto M.D. on 06/25/2010 04:08:00 PM

## 2010-06-26 ENCOUNTER — Telehealth (INDEPENDENT_AMBULATORY_CARE_PROVIDER_SITE_OTHER): Payer: Self-pay | Admitting: Internal Medicine

## 2010-06-27 ENCOUNTER — Telehealth (INDEPENDENT_AMBULATORY_CARE_PROVIDER_SITE_OTHER): Payer: Self-pay | Admitting: Internal Medicine

## 2010-06-27 ENCOUNTER — Encounter (INDEPENDENT_AMBULATORY_CARE_PROVIDER_SITE_OTHER): Payer: Self-pay | Admitting: Internal Medicine

## 2010-06-28 ENCOUNTER — Ambulatory Visit: Payer: Medicaid Other | Admitting: Radiation Oncology

## 2010-07-01 ENCOUNTER — Encounter (INDEPENDENT_AMBULATORY_CARE_PROVIDER_SITE_OTHER): Payer: Self-pay | Admitting: Internal Medicine

## 2010-07-01 ENCOUNTER — Ambulatory Visit: Payer: Medicaid Other | Attending: Radiation Oncology | Admitting: Radiation Oncology

## 2010-07-02 ENCOUNTER — Other Ambulatory Visit (HOSPITAL_COMMUNITY): Payer: Medicaid Other

## 2010-07-03 ENCOUNTER — Ambulatory Visit: Payer: Self-pay | Admitting: Pain Medicine

## 2010-07-04 ENCOUNTER — Encounter (HOSPITAL_COMMUNITY): Payer: Self-pay

## 2010-07-04 ENCOUNTER — Ambulatory Visit (HOSPITAL_COMMUNITY)
Admission: RE | Admit: 2010-07-04 | Discharge: 2010-07-04 | Disposition: A | Discharge status: Home / Self Care | Payer: Medicaid Other | Source: Ambulatory Visit | Attending: Radiation Oncology | Admitting: Radiation Oncology

## 2010-07-04 ENCOUNTER — Other Ambulatory Visit: Payer: Self-pay | Admitting: Radiation Oncology

## 2010-07-04 DIAGNOSIS — K7460 Unspecified cirrhosis of liver: Secondary | ICD-10-CM | POA: Insufficient documentation

## 2010-07-04 DIAGNOSIS — C6290 Malignant neoplasm of unspecified testis, unspecified whether descended or undescended: Secondary | ICD-10-CM

## 2010-07-04 DIAGNOSIS — I7789 Other specified disorders of arteries and arterioles: Secondary | ICD-10-CM | POA: Insufficient documentation

## 2010-07-04 DIAGNOSIS — K7689 Other specified diseases of liver: Secondary | ICD-10-CM | POA: Insufficient documentation

## 2010-07-04 DIAGNOSIS — Z8546 Personal history of malignant neoplasm of prostate: Secondary | ICD-10-CM | POA: Insufficient documentation

## 2010-07-04 DIAGNOSIS — J438 Other emphysema: Secondary | ICD-10-CM | POA: Insufficient documentation

## 2010-07-04 DIAGNOSIS — I2510 Atherosclerotic heart disease of native coronary artery without angina pectoris: Secondary | ICD-10-CM | POA: Insufficient documentation

## 2010-07-04 DIAGNOSIS — Z923 Personal history of irradiation: Secondary | ICD-10-CM | POA: Insufficient documentation

## 2010-07-04 HISTORY — DX: Unspecified cirrhosis of liver: K74.60

## 2010-07-04 HISTORY — DX: Malignant (primary) neoplasm, unspecified: C80.1

## 2010-07-04 LAB — AFP TUMOR MARKER: AFP-Tumor Marker: 2.5 ng/mL (ref 0.0–8.0)

## 2010-07-04 LAB — BUN: BUN: 8 mg/dL (ref 6–23)

## 2010-07-04 LAB — LACTATE DEHYDROGENASE: LDH: 163 U/L (ref 94–250)

## 2010-07-04 MED ORDER — IOHEXOL 300 MG/ML  SOLN
125.0000 mL | Freq: Once | INTRAMUSCULAR | Status: AC | PRN
  Administered 2010-07-04: 125 mL via INTRAVENOUS

## 2010-07-04 NOTE — Progress Notes (Signed)
Summary: PAIN MEDICATION REFILL   Phone Note From Other Clinic   Summary of Call: DR GREGORY NURSE  CALL FROM [facility] AND THE DR SUGGEST THAT DR Davin Archuletta SHOULD PRESCRIBE 1 MORE MONTH OF PAIN MEDICINE . THEY SAME A FAX YESTERDAY  Initial call taken by: Cheryll Dessert,  June 27, 2010 2:12 PM  Follow-up for Phone Call        Addressed in another phone note.  Dr. Delrae Alfred completed Rx -- pt.'s sister made aware.  Dutch Quint RN  June 27, 2010 2:25 PM

## 2010-07-04 NOTE — Progress Notes (Signed)
  Phone Note From Other Clinic   Summary of Call: Dr. Metta Clines from pain clinic asking that we continue with pain med coverage for 1 more month Initial call taken by: Julieanne Manson MD,  June 27, 2010 8:54 AM    Prescriptions: HYDROCODONE-ACETAMINOPHEN 5-325 MG TABS (HYDROCODONE-ACETAMINOPHEN) 1 tab by mouth two times a day for back pain-must last 30 days  #60 x 0   Entered and Authorized by:   Julieanne Manson MD   Signed by:   Julieanne Manson MD on 06/27/2010   Method used:   Handwritten   RxID:   [id number]

## 2010-07-04 NOTE — Letter (Signed)
Summary: [place] [facility]//DR.Isidra Mings TO CONT. PAIN MED  [place] [facility]//DR.Shery Wauneka TO CONT. PAIN MED   Imported By: Arta Bruce 06/27/2010 10:36:46  _____________________________________________________________________  External Attachment:    Type:   Image     Comment:   External Document

## 2010-07-04 NOTE — Progress Notes (Signed)
Summary: phone call  Phone Note Other Incoming   Summary of Call: Pt.'s sister called Tomasita Morrow and would like a return call regarding a fax from the pain clinic. Her cell is [phone] if you cannot reach her at the home number. Initial call taken by: Sharen Heck RN,  June 26, 2010 11:18 AM  Follow-up for Phone Call        I do not understand above message. Please call person calling and get clarification as to what she is wanting. If you get hold of her, please also see previous phone note on hold to [facility] Follow-up by: Julieanne Manson MD,  June 26, 2010 3:53 PM  Additional Follow-up for Phone Call Additional follow up Details #1::        Sister states that there is a medication that Dr. Metta Clines at the pain clinic wanted to try him on and wanted Dr. Delrae Alfred to handle.  There should have been a fax from Dr. Metta Clines about that.  I saw the next note re hydrocodone Rx -- queried sister if that was it -- she stated she believed that was what the fax was about.  Advised of new Rx for hydrocodone-acetaminophen sent this morning to [facility] pharmacy.  I addressed all issues in three phone notes.   Dutch Quint RN  June 27, 2010 10:30 AM

## 2010-07-08 ENCOUNTER — Encounter (INDEPENDENT_AMBULATORY_CARE_PROVIDER_SITE_OTHER): Payer: Self-pay | Admitting: Internal Medicine

## 2010-07-08 LAB — BASIC METABOLIC PANEL
CO2: 27 mEq/L (ref 19–32)
Chloride: 100 mEq/L (ref 96–112)
GFR calc Af Amer: >60 mL/min (ref >60)
GFR calc Af Amer: >60 mL/min (ref >60)
GFR calc non Af Amer: >60 mL/min (ref >60)
Glucose, Bld: 106 mg/dL — ABNORMAL HIGH (ref 70–99)
Potassium: 3.8 mEq/L (ref 3.5–5.1)
Potassium: 4.1 mEq/L (ref 3.5–5.1)
Potassium: 4.2 mEq/L (ref 3.5–5.1)
Sodium: 132 mEq/L — ABNORMAL LOW (ref 135–145)
Sodium: 134 mEq/L — ABNORMAL LOW (ref 135–145)

## 2010-07-08 LAB — URINALYSIS, ROUTINE W REFLEX MICROSCOPIC
Bilirubin Urine: NEGATIVE
Ketones, ur: NEGATIVE mg/dL
Nitrite: NEGATIVE
Specific Gravity, Urine: 1.003 — ABNORMAL LOW (ref 1.005–1.030)
Urobilinogen, UA: 0.2 mg/dL (ref 0.0–1.0)

## 2010-07-08 LAB — CBC
HCT: 40.3 % (ref 39.0–52.0)
HCT: 41.1 % (ref 39.0–52.0)
MCH: 33.7 pg (ref 26.0–34.0)
MCH: 33.8 pg (ref 26.0–34.0)
MCHC: 36 g/dL (ref 30.0–36.0)
MCV: 93.7 fL (ref 78.0–100.0)
MCV: 94.3 fL (ref 78.0–100.0)
MCV: 97.3 fL (ref 78.0–100.0)
Platelets: 112 10*3/uL — ABNORMAL LOW (ref 150–400)
Platelets: 145 10*3/uL — ABNORMAL LOW (ref 150–400)
Platelets: 96 10*3/uL — ABNORMAL LOW (ref 150–400)
RBC: 4.14 MIL/uL — ABNORMAL LOW (ref 4.22–5.81)
RBC: 4.36 MIL/uL (ref 4.22–5.81)
RDW: 14 % (ref 11.5–15.5)
WBC: 6.9 10*3/uL (ref 4.0–10.5)

## 2010-07-08 LAB — CARDIAC PANEL(CRET KIN+CKTOT+MB+TROPI)
CK, MB: 1.1 ng/mL (ref 0.3–4.0)
Relative Index: INVALID (ref 0.0–2.5)
Total CK: 34 U/L (ref 7–232)

## 2010-07-08 LAB — COMPREHENSIVE METABOLIC PANEL
ALT: 14 U/L (ref 0–53)
AST: 18 U/L (ref 0–37)
Albumin: 3.6 g/dL (ref 3.5–5.2)
Albumin: 4.2 g/dL (ref 3.5–5.2)
BUN: 8 mg/dL (ref 6–23)
CO2: 27 mEq/L (ref 19–32)
Calcium: 9.3 mg/dL (ref 8.4–10.5)
Chloride: 97 mEq/L (ref 96–112)
Creatinine, Ser: 0.83 mg/dL (ref 0.4–1.5)
GFR calc Af Amer: >60 mL/min (ref >60)
GFR calc non Af Amer: >60 mL/min (ref >60)
Sodium: 132 mEq/L — ABNORMAL LOW (ref 135–145)
Total Bilirubin: 0.8 mg/dL (ref 0.3–1.2)
Total Bilirubin: 0.8 mg/dL (ref 0.3–1.2)
Total Protein: 7.9 g/dL (ref 6.0–8.3)

## 2010-07-08 LAB — PHENYTOIN LEVEL, TOTAL
Phenytoin Lvl: 2.5 ug/mL — ABNORMAL LOW (ref 10.0–20.0)
Phenytoin Lvl: 3.3 ug/mL — ABNORMAL LOW (ref 10.0–20.0)

## 2010-07-08 LAB — DIFFERENTIAL
Basophils Absolute: 0 10*3/uL (ref 0.0–0.1)
Basophils Relative: 0 % (ref 0–1)
Eosinophils Relative: 2 % (ref 0–5)
Lymphocytes Relative: 10 % — ABNORMAL LOW (ref 12–46)
Lymphocytes Relative: 13 % (ref 12–46)
Lymphs Abs: 0.9 10*3/uL (ref 0.7–4.0)
Monocytes Absolute: 0.6 10*3/uL (ref 0.1–1.0)
Monocytes Relative: 7 % (ref 3–12)
Neutro Abs: 9.7 10*3/uL — ABNORMAL HIGH (ref 1.7–7.7)

## 2010-07-08 LAB — TSH: TSH: 1.627 u[IU]/mL (ref 0.350–4.500)

## 2010-07-08 LAB — RAPID URINE DRUG SCREEN, HOSP PERFORMED
Amphetamines: NOT DETECTED
Cocaine: NOT DETECTED
Opiates: NOT DETECTED
Tetrahydrocannabinol: NOT DETECTED

## 2010-07-08 LAB — POCT CARDIAC MARKERS: Troponin i, poc: <0.05 ng/mL (ref 0.00–0.09)

## 2010-07-08 LAB — D-DIMER, QUANTITATIVE: D-Dimer, Quant: <0.22 ug/mL-FEU (ref 0.00–0.48)

## 2010-07-08 LAB — PHOSPHORUS: Phosphorus: 3.4 mg/dL (ref 2.3–4.6)

## 2010-07-08 LAB — PROTIME-INR: INR: 0.97 (ref 0.00–1.49)

## 2010-07-08 LAB — APTT: aPTT: 41 seconds — ABNORMAL HIGH (ref 24–37)

## 2010-07-08 LAB — LIPID PANEL
Triglycerides: 51 mg/dL (ref <150)
VLDL: 10 mg/dL (ref 0–40)

## 2010-07-08 LAB — MAGNESIUM: Magnesium: 2.2 mg/dL (ref 1.5–2.5)

## 2010-07-09 LAB — COMPREHENSIVE METABOLIC PANEL
Albumin: 3.8 g/dL (ref 3.5–5.2)
BUN: 9 mg/dL (ref 6–23)
Creatinine, Ser: 0.94 mg/dL (ref 0.4–1.5)
Glucose, Bld: 113 mg/dL — ABNORMAL HIGH (ref 70–99)
Total Protein: 7.6 g/dL (ref 6.0–8.3)

## 2010-07-09 LAB — CBC
HCT: 43.1 % (ref 39.0–52.0)
Hemoglobin: 15.3 g/dL (ref 13.0–17.0)
MCHC: 35.4 g/dL (ref 30.0–36.0)
RBC: 4.47 MIL/uL (ref 4.22–5.81)
WBC: 10.2 10*3/uL (ref 4.0–10.5)

## 2010-07-09 LAB — BASIC METABOLIC PANEL
GFR calc Af Amer: >60 mL/min (ref >60)
GFR calc non Af Amer: >60 mL/min (ref >60)
Glucose, Bld: 86 mg/dL (ref 70–99)
Potassium: 3.5 mEq/L (ref 3.5–5.1)
Sodium: 134 mEq/L — ABNORMAL LOW (ref 135–145)

## 2010-07-09 LAB — RAPID URINE DRUG SCREEN, HOSP PERFORMED
Amphetamines: NOT DETECTED
Benzodiazepines: POSITIVE — AB
Cocaine: NOT DETECTED
Opiates: NOT DETECTED
Tetrahydrocannabinol: NOT DETECTED

## 2010-07-09 LAB — PHENYTOIN LEVEL, TOTAL: Phenytoin Lvl: 7 ug/mL — ABNORMAL LOW (ref 10.0–20.0)

## 2010-07-09 LAB — DIFFERENTIAL
Lymphocytes Relative: 9 % — ABNORMAL LOW (ref 12–46)
Lymphs Abs: 1 10*3/uL (ref 0.7–4.0)
Monocytes Absolute: 0.8 10*3/uL (ref 0.1–1.0)
Monocytes Relative: 8 % (ref 3–12)
Neutro Abs: 8.3 10*3/uL — ABNORMAL HIGH (ref 1.7–7.7)

## 2010-07-09 LAB — ETHANOL: Alcohol, Ethyl (B): <5 mg/dL (ref 0–10)

## 2010-07-09 NOTE — Letter (Signed)
Summary: [facility] /OFICE VISIT  [facility] /OFICE VISIT   Imported By: Arta Bruce 07/03/2010 11:09:48  _____________________________________________________________________  External Attachment:    Type:   Image     Comment:   External Document

## 2010-07-09 NOTE — Letter (Signed)
Summary: [facility] //OFFICE VISIT  [facility] //OFFICE VISIT   Imported By: Arta Bruce 07/01/2010 15:48:41  _____________________________________________________________________  External Attachment:    Type:   Image     Comment:   External Document

## 2010-07-09 NOTE — Letter (Signed)
Summary: REQUESTING RECORDS FROM [place]//[facility]  REQUESTING RECORDS FROM [place]//[facility]   Imported By: Arta Bruce 07/01/2010 14:29:52  _____________________________________________________________________  External Attachment:    Type:   Image     Comment:   External Document

## 2010-07-09 NOTE — Letter (Signed)
Summary: [facility] OFFICE VISIT  [facility] OFFICE VISIT   Imported By: Arta Bruce 07/01/2010 09:00:59  _____________________________________________________________________  External Attachment:    Type:   Image     Comment:   External Document

## 2010-07-10 LAB — DIFFERENTIAL
Basophils Absolute: 0.1 10*3/uL (ref 0.0–0.1)
Basophils Relative: 1 % (ref 0–1)
Eosinophils Absolute: 0.3 10*3/uL (ref 0.0–0.7)
Monocytes Relative: 13 % — ABNORMAL HIGH (ref 3–12)
Neutro Abs: 4 10*3/uL (ref 1.7–7.7)
Neutrophils Relative %: 60 % (ref 43–77)

## 2010-07-10 LAB — BASIC METABOLIC PANEL
CO2: 29 mEq/L (ref 19–32)
Calcium: 9.5 mg/dL (ref 8.4–10.5)
Creatinine, Ser: 0.79 mg/dL (ref 0.4–1.5)
GFR calc Af Amer: >60 mL/min (ref >60)
GFR calc non Af Amer: >60 mL/min (ref >60)
Glucose, Bld: 101 mg/dL — ABNORMAL HIGH (ref 70–99)
Sodium: 129 mEq/L — ABNORMAL LOW (ref 135–145)

## 2010-07-10 LAB — CBC
Hemoglobin: 15.5 g/dL (ref 13.0–17.0)
MCH: 33 pg (ref 26.0–34.0)
MCHC: 36.6 g/dL — ABNORMAL HIGH (ref 30.0–36.0)
Platelets: 197 10*3/uL (ref 150–400)
RDW: 14.3 % (ref 11.5–15.5)

## 2010-07-10 LAB — HEPATIC FUNCTION PANEL
Albumin: 4 g/dL (ref 3.5–5.2)
Alkaline Phosphatase: 104 U/L (ref 39–117)
Indirect Bilirubin: 0.3 mg/dL (ref 0.3–0.9)
Total Protein: 7.9 g/dL (ref 6.0–8.3)

## 2010-07-10 LAB — RAPID URINE DRUG SCREEN, HOSP PERFORMED
Amphetamines: NOT DETECTED
Barbiturates: NOT DETECTED
Benzodiazepines: POSITIVE — AB
Opiates: NOT DETECTED

## 2010-07-10 LAB — TRICYCLICS SCREEN, URINE: TCA Scrn: NOT DETECTED

## 2010-07-10 LAB — URINALYSIS, ROUTINE W REFLEX MICROSCOPIC
Ketones, ur: NEGATIVE mg/dL
Nitrite: NEGATIVE
Specific Gravity, Urine: 1.005 (ref 1.005–1.030)
Urobilinogen, UA: 0.2 mg/dL (ref 0.0–1.0)
pH: 6 (ref 5.0–8.0)

## 2010-07-10 LAB — PHENYTOIN LEVEL, TOTAL: Phenytoin Lvl: <2.5 ug/mL — ABNORMAL LOW (ref 10.0–20.0)
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
## 2010-07-16 NOTE — Letter (Signed)
Summary: REQUEST INFORMATION TO [place]  REQUEST INFORMATION TO [place]   Imported By: Arta Bruce 07/11/2010 16:17:40  _____________________________________________________________________  External Attachment:    Type:   Image     Comment:   External Document

## 2010-07-16 NOTE — Progress Notes (Signed)
Summary: LEXAPRO REFILL   Phone Note Call from Patient Call back at [phone]   Caller: Other Relative Summary of Call: PT SISTER LISA IS CALLING BECAUSE HIS BROTHER WAS IN THE HOSPITAL 06-09-10 AND SEND IT HOME 06-12-10 FOR RETAINING FLUIDS AND THE DR PRESCRIBE LEXAPRO AND PT NEED A REFILL BECAUSE THE OTHER MEDICATION THAT SCOTT PRESCRIBE FOR ANTIDEPRESSING   MIRTAZAPINE  THEY ARE USING FOR SLEEP PILLS . [facility]   [phone] [facility] SISTER @ [phone] LISA WILL BE THERE TODAY AND TOMORROW SHE WILL BE THERE @ 12:30 PM   THANK YOU  Initial call taken by: Cheryll Dessert,  June 18, 2010 4:05 PM  Follow-up for Phone Call        [name] is not just for sleep, it is also and antidepressant.  He will need to come in to sign Release of info for continuity of care so we can get records ASAP from [facility].  Generally, the pts.  get their meds at [facility] for psych --were they instructed to do this? I am currently unable to see in his hospital records what meds they discharged him with from [facility]. Was admitted for alcohol withdrawal. Follow-up by: Julieanne Manson MD,  June 24, 2010 10:04 AM  Additional Follow-up for Phone Call Additional follow up Details #1::        LM on [phone] -- Hale Drone CMA  June 27, 2010 9:08 AM   Per sister -- Was told to go to [facility] for meds, but was worse when he left there -- he was put on some "crazy" meds -- made him a "madman".  Wants to be followed Dr. Delrae Alfred if possible, rather than the [facility].   Did not go to [facility] -- just stayed at [facility] because [facility] had no beds.  They started him on Lexapro the last time he was in [facility], then was changed to mirtazapine, but was changed back to Lexapro, 5 mg. instead of 20 mg. -- they did not write him a Rx because he was told to f/u with PCP for meds.   Additional Follow-up by: Dutch Quint RN,  June 27, 2010 10:26 AM    Additional Follow-up for Phone Call Additional follow up Details  #2::    If they still have Mirtazepine, would recommend staying on that until I can get the records from his [facility]al health hospitalization and [facility]--need him to sign for those and send that release ASAP  Julieanne Manson MD  June 27, 2010 1:53 PM   Advised sister of provider's response and recommendation -- They will be here on Monday (07/01/10) to have pt. sign forms.  They still have mirtazepine, but not many left.  Advised he should still have refills available -- if not, to call back.   Dutch Quint RN  June 27, 2010 2:29 PM  Theresa--can you call [facility]--see if they received the release (hoping this was done on the 5th) and find out what he was changed to for psychotropic meds?  Julieanne Manson MD  July 03, 2010 5:38 AM   Spoke with Arletta Bale at [facility] -- states pt. has not been active there since 2007.  Advised to call [facility] -- spoke with Aurea Graff, who stated they can't confirm or deny pt. without a release.  Left message for pt.'s sister to return call.  Dutch Quint RN  July 03, 2010 10:54 AM  Per sister -- Dr.  who saw pt. at [facility] during last admission was who changed him back to Lexapro, but only 5 mg. daily -- [facility] did not change meds, [facility] did not change meds. Has not gone to [facility] at all.  When pt. went to [facility] for the liver transplant protocol, it was those doctors who originally put him on Lexapro 20 mg. daily.  Sister thinks the Lexapro at the lower dose might be why he's "struggling."   Dutch Quint RN  July 03, 2010 11:11 AM  I am not understanding this.  I thought she said he was doing better on this medication.  Tried to call, but could only leave a message.  Need to know if he did better on the Lexapro or the Mirtazepine.  Julieanne Manson MD  July 03, 2010 1:32 PM    Additional Follow-up for Phone Call Additional follow up Details #3:: Details for Additional Follow-up Action Taken: Sister states he was doing a  "whole lot better" on the Lexapro.  Unable to locate signed medical release that sister states he filled out a form -- pt. will come back in to complete another form tomorrow or Monday.  [facility] states to send authorization and request to fax #[phone].  Dutch Quint RN  July 04, 2010 4:34 PM  Switch to Lexapro 20 mg.  See if he has a follow up with me, if not, would like to see him in 1 month after start of this dose of Lexapro.  He should take only 15 mg of Mirtazepine daily for 1 week after starting the Lexapro and then stop the Mirtazepine.  If he is already out of the Mirtazepine--do not restart.   Julieanne Manson MD  July 08, 2010 8:21 AM    Sister advised of change in Rx, provider's instructions.  Verbalized understanding and agreement.  F/U OV scheduled 08/13/10.  Received medical release forms -- sent request and copy of medical release form to [facility] for pt. records.  Dutch Quint RN  July 08, 2010 10:52 AM   New/Updated Medications: LEXAPRO 20 MG TABS (ESCITALOPRAM OXALATE) 1 tab by mouth daily Prescriptions: LEXAPRO 20 MG TABS (ESCITALOPRAM OXALATE) 1 tab by mouth daily  #30 x 2   Entered and Authorized by:   Julieanne Manson MD   Signed by:   Julieanne Manson MD on 07/08/2010   Method used:   Electronically to        [facility]* (retail)       [address]       [place], [place]  [address]       Ph: [phone]       Fax: [phone]   RxID:   [id number]

## 2010-07-16 NOTE — Letter (Signed)
Summary: REQUESTING RECORDS  FROM //[facility]  REQUESTING RECORDS  FROM //[facility]   Imported By: Arta Bruce 07/08/2010 11:33:07  _____________________________________________________________________  External Attachment:    Type:   Image     Comment:   External Document

## 2010-07-25 NOTE — Letter (Signed)
Summary: [facility]  [facility]   Imported By: Arta Bruce 07/18/2010 14:59:12  _____________________________________________________________________  External Attachment:    Type:   Image     Comment:   External Document

## 2010-07-26 ENCOUNTER — Ambulatory Visit: Payer: Medicaid Other | Attending: Radiation Oncology | Admitting: Radiation Oncology

## 2010-08-02 ENCOUNTER — Ambulatory Visit: Payer: Medicaid Other | Attending: Radiation Oncology | Admitting: Radiation Oncology

## 2010-08-04 LAB — TYPE AND SCREEN
ABO/RH(D): O POS
Donor AG Type: NEGATIVE

## 2010-08-04 LAB — URINALYSIS, ROUTINE W REFLEX MICROSCOPIC
Glucose, UA: NEGATIVE mg/dL
Ketones, ur: NEGATIVE mg/dL
Nitrite: NEGATIVE
Specific Gravity, Urine: 1.003 — ABNORMAL LOW (ref 1.005–1.030)
pH: 6 (ref 5.0–8.0)

## 2010-08-04 LAB — COMPREHENSIVE METABOLIC PANEL
AST: 20 U/L (ref 0–37)
Albumin: 3.6 g/dL (ref 3.5–5.2)
Calcium: 9.1 mg/dL (ref 8.4–10.5)
Creatinine, Ser: 1 mg/dL (ref 0.4–1.5)
GFR calc Af Amer: >60 mL/min (ref >60)
GFR calc non Af Amer: >60 mL/min (ref >60)
Total Protein: 7 g/dL (ref 6.0–8.3)

## 2010-08-04 LAB — CBC
HCT: 35.2 % — ABNORMAL LOW (ref 39.0–52.0)
MCV: 94.5 fL (ref 78.0–100.0)
Platelets: 149 10*3/uL — ABNORMAL LOW (ref 150–400)
RDW: 13.2 % (ref 11.5–15.5)

## 2010-08-04 LAB — BASIC METABOLIC PANEL
BUN: 7 mg/dL (ref 6–23)
Chloride: 97 mEq/L (ref 96–112)
Creatinine, Ser: 1.08 mg/dL (ref 0.4–1.5)
GFR calc non Af Amer: >60 mL/min (ref >60)
Glucose, Bld: 82 mg/dL (ref 70–99)
Potassium: 3.4 mEq/L — ABNORMAL LOW (ref 3.5–5.1)

## 2010-08-04 LAB — APTT: aPTT: 38 seconds — ABNORMAL HIGH (ref 24–37)

## 2010-08-04 LAB — DIFFERENTIAL
Basophils Absolute: 0 10*3/uL (ref 0.0–0.1)
Eosinophils Absolute: 0.2 10*3/uL (ref 0.0–0.7)
Eosinophils Relative: 2 % (ref 0–5)
Neutrophils Relative %: 72 % (ref 43–77)

## 2010-08-04 LAB — PROTIME-INR: INR: 1.1 (ref 0.00–1.49)

## 2010-08-05 ENCOUNTER — Other Ambulatory Visit: Payer: Self-pay | Admitting: Radiation Oncology

## 2010-08-05 DIAGNOSIS — C6290 Malignant neoplasm of unspecified testis, unspecified whether descended or undescended: Secondary | ICD-10-CM

## 2010-08-12 ENCOUNTER — Ambulatory Visit: Payer: Self-pay | Admitting: Pain Medicine

## 2010-08-21 ENCOUNTER — Ambulatory Visit: Payer: Self-pay | Admitting: Pain Medicine

## 2010-09-10 NOTE — Op Note (Signed)
NAME:  [name], [name] NO.:  [id number]   MEDICAL RECORD NO.:  [id number]          PATIENT TYPE:  AMB   LOCATION:  ENDO                         FACILITY:  [facility]   PHYSICIAN:  Petra Kuba, M.D.    DATE OF BIRTH:  [birth year]   DATE OF PROCEDURE:  02/01/2008  DATE OF DISCHARGE:                               OPERATIVE REPORT   PROCEDURES:  ERCP, sphincterotomy, and balloon pull-through.   INDICATIONS:  CBD stone on CT scan in a patient with right upper  quadrant pain.  Consent was signed after risks, benefits, methods, and  options thoroughly discussed with both the sister and the patient prior  to having sedation.   MEDICINES USED:  1. Fentanyl 180 mcg.  2. Versed 8 mg.  3. Glucagon 3 mg during the procedure.  4. Robinul 4 mg during the procedure.   PROCEDURE:  The side-viewing therapeutic video duodenoscope was inserted  by indirect vision into the stomach and advanced through the pylorus  into the duodenum, and a normal-appearing ampulla was brought into view.  Using triple-lumen sphincterotome loaded with the Jagwire, initially we  could not get deep selective cannulation on one limb of injection.  Normal-appearing pancreatogram was obtained.  We stopped the injection  once we realized that it was pancreas.  The sphincterotome was  readjusted, and we were able to get deep selective cannulation on  initial injection.  No obvious stone was seen.  The cystic duct was  patent.  The CBD was normal.  The intrahepatic was normal.  We went  ahead and proceeded with a small sphincterotomy, but at that point,  there was increased spasm, and despite the glucagon and Robinul, we  could not alleviate spasm, and it was not safe to cut any further.  We  could get the 3/4 sphincterotome in and out of the duct but could not  see what we were doing.  We went ahead and exchanged the sphincterotome  for adjustable balloon, advanced it into the duct under fluoro, inflated  it  to 12, and went ahead and proceeded with a balloon pull-through.  Unfortunately, the balloon popped, but no obvious stone was seen.  There  was no evidence of debris.  We went ahead and tried more glucagon and  more Robinul, but spasm did not resolve.  We did go ahead and turn him  on his side at this juncture, and that seemed to help the spasm.  We  were able to reinsert the sphincterotome and we increased the  sphincterotomy until we could get the full length of the sphincterotome  in and out of the duct, and there was adequate biliary drainage seen.  We went ahead and then exchanged to a new balloon and were able to  withdraw it at a roughly 8-mm size without resistance.  We then went  ahead and inflated to 12 and were able to withdraw that without  resistance.  No further stones were seen.  We went ahead and proceeded  with an occlusion cholangiogram in a custom made fashion, which did not  reveal any residual  stones.  We went ahead and proceeded with one more  balloon pull-through at a 12-mm mark, and again, no further debris or  stones were seen.  We elected to stop the procedure at this junction.  The patient tolerated the procedure well.  There was no obvious  immediate complication.   ENDOSCOPIC DIAGNOSES:  1. Normal ampulla.  2. Normal PD on one limb of injection.  3. Normal common bile duct, doubt stone seen, patent cystic duct.  4. Status post medium sphincterotomy and multiple 12-15 adjustable      balloon pull-throughs including some at the 8-mm rough __________      mark.  5. Negative conclusion cholangiogram at the end of the procedure.   PLAN:  __________ clinically, consider rechecking CT scan, maybe an MRI  or CT, and possibly PET scan for the abnormal node as mentioned on the  CAT scan report.  We will see him back p.r.n. 2 weeks to recheck  symptoms and decide any other workup or plan at that junction.           ______________________________  Petra Kuba,  M.D.     MEM/MEDQ  D:  02/01/2008  T:  02/02/2008  Job:  [id number]   cc:   Fanny Dance. Rankins, M.D.  Abelino Derrick, R.N.

## 2010-09-10 NOTE — Procedures (Signed)
EEG NUMBER:  [id number].   REFERRING PHYSICIAN:  Turkey R. Rankins, M.D.   CLINICAL HISTORY:  A 50 year old male being evaluated for seizure.  He  is being weaned off Dilantin.   MEDICATIONS LISTED:  Dilantin, spironolactone, lactulose, lorazepam,  metoprolol, Lexapro, Lasix, vitamin, and Lidoderm patch.   This is a sleep-deprived EEG recorded with the patient awake and asleep  using standard 10/20 electrode placement on a 17-channel machine.   The record begins with the patient asleep and shows normal stage I, II,  and III of sleep with normal physiological findings.  No paroxysmal  epileptiform activity, spikes, or sharp waves are seen.  Length of the  recording is 22.7 minutes.  Technical component is average.  EKG tracing  reveals regular sinus rhythm.  Hyperventilation and photic stimulation  are both unremarkable.   IMPRESSION:  This EEG performed mostly in the sleep state is within  normal limits.  No definite epileptiform features are noted.           ______________________________  Sunny Schlein. Pearlean Brownie, MD     ZOX:[id number]  D:  09/15/2007 17:12:14  T:  09/16/2007 00:50:07  Job #:  [id number]   cc:   Fanny Dance. Rankins, M.D.  Fax: [phone]

## 2010-09-10 NOTE — H&P (Signed)
NAME:  Justin, [name] NO.:  [id number]   MEDICAL RECORD NO.:  [id number]          PATIENT TYPE:  OBV   LOCATION:  0113                         FACILITY:  [facility]   PHYSICIAN:  Sigmund I. Patsi Sears, M.D.DATE OF BIRTH:  Oct 17, 1960   DATE OF ADMISSION:  11/01/2008  DATE OF DISCHARGE:                              HISTORY & PHYSICAL   CHIEF COMPLAINT:  Left testicular pain.   HISTORY OF PRESENT ILLNESS:  [name] is a very pleasant 50 year old  Caucasian male with a history of alcoholic cirrhosis and seizures who  developed acute onset left testicular pain on Saturday, October 28, 2008.  This was associated with significant left testicular swelling.  There  was no associated trauma.  The pain is severe, 10 out of 10 in nature,  and radiates around to his left low back.  He elected to seek medical  attention earlier today and noted that on scrotal ultrasound there was  no flow to the left testicle consistent with a left testicular torsion.  He now presents for urologic evaluation.  He denies any significant  urinary symptoms.  There is no history of hematuria, dysuria, urinary  tract infections.  He has never had testicular pain before.  For his  alcoholic cirrhosis, he quit drinking 4 years ago and is currently being  medically managed   MEDICATIONS:  1. Spironolactone.  2. Ativan.  3. Dilantin.  4. Lactulose.  5. Metoprolol.  6. Lexapro.  7. Lasix.   ALLERGIES:  No known drug allergies.   PAST MEDICAL HISTORY:  1. Alcoholic cirrhosis.  2. Hypertension.  3. Seizure disorder.   FAMILY HISTORY:  There is no family history of testicular cancer,  bladder cancer, prostate cancer or kidney cancer.  There is no family  history of nephrolithiasis.   PAST SURGICAL HISTORY:  1. Appendectomy.  2. Leg surgery.   SOCIAL HISTORY:  The patient has a history of alcohol abuse but quit  drinking 4 years ago.  He smokes a pack of cigarettes a day.  He denies  illicit drug  use.   REVIEW OF SYSTEMS:  A complete 12 point review of systems was obtained  but was negative except as otherwise stated in the above history of  present illness.   PHYSICAL EXAMINATION:  VITAL SIGNS:  His vital signs are stable and he  is afebrile.  GENERAL:  He is in no acute distress.  He is alert and oriented x3,  pleasant appearing Caucasian male.  HEENT:  Mucous membranes are moist.  Sclerae anicteric.  Oropharynx is  free of lesions.  NECK:  Supple.  No cervical lymphadenopathy.  CARDIOVASCULAR:  Regular rate and rhythm.  No murmurs, rubs or gallops.  RESPIRATORY:  Clear to auscultation bilaterally with good aeration.  ABDOMEN:  Soft, nontender, nondistended.  Normal active bowel sounds.  No masses or bruits.  BACK:  No costovertebral angle tenderness.  GENITOURINARY:  Bilateral testes are descended.  The left testicle is  notably enlarged and exquisitely tender to palpation.  Does have  horizontal lie to it.  Right testicle is normal to palpation.  There are  no masses appreciable in the right testis or epididymis.  INGUINAL:  There is no inguinal lymphadenopathy.  SKIN:  No rashes.  NEUROLOGICAL:  Nonfocal.  PSYCHIATRIC:  Full range of affect, no evidence of mood disorders.   LABORATORY DATA:  His INR is 1.1, PTT is 38.  White count is 10.6,  hemoglobin 11.9, platelet count 149,000.   RADIOLOGY:  Scrotal ultrasound from November 01, 2008 was reviewed which  reveals a normal right testicle, left testicle is enlarged with  increased flow to the periphery and heterogenous central portion of  testicle does not any significant flow.  Epididymis is enlarged with  increased flow and there is a left sided hydrocele and left varicocele.  This is consistent with probable left testicular torsion.   IMPRESSION:  [name] is a 50 year old Caucasian with history of  alcoholic cirrhosis who presents with left testicular pain for the past  4 to 5 days and possible left testicular  torsion.   PLAN:  The patient has been consented for left scrotal exploration,  possible left orchiectomy and possible right orchidopexy which we will  do on an urgent basis.  Risks of surgery were outlined to the patient  including bleeding, infection, injury to adjacent structure, possible  need for additional operations and possible increased risk for medical  complications due to his history of alcoholic cirrhosis.  He understands  all these risks and wishes to proceed.     ______________________________  Edward Qualia      Sigmund I. Patsi Sears, M.D.  Electronically Signed    KR/MEDQ  D:  11/01/2008  T:  11/01/2008  Job:  [id number]

## 2010-09-10 NOTE — Op Note (Signed)
NAME:  [name], [name]               ACCOUNT NO.:  [id number]   MEDICAL RECORD NO.:  [id number]          PATIENT TYPE:  OBV   LOCATION:  1425                         FACILITY:  [facility]   PHYSICIAN:  Sigmund I. Patsi Sears, M.D.DATE OF BIRTH:  1960-11-28   DATE OF PROCEDURE:  11/01/2008  DATE OF DISCHARGE:                               OPERATIVE REPORT   PREOPERATIVE DIAGNOSIS:  Left testicular torsion.   POSTOPERATIVE DIAGNOSIS:  Left testicular torsion.   PROCEDURE PERFORMED:  1. Left orchiectomy.  2. Right orchidopexy.   SURGEON:  Sigmund I. Patsi Sears, M.D.   Valetta FullerRonaldo Miyamoto A. Richards.   ANESTHESIA:  General endotracheal anesthesia.   FINDINGS:  Left necrotic nonviable testicle.   SPECIMENS:  Left testicle.   INDICATIONS FOR PROCEDURE:  [name] is a 50 year old Caucasian male  with alcohol cirrhosis who presented to the ER with 5 days of left  testicular pain and swelling.  He first noticed this on Saturday and it  has progressively worsened.  A scrotal ultrasound revealed no flow to  the testicle and he was therefore brought back to the operating room in  an urgent fashion for scrotal exploration.   DESCRIPTION OF PROCEDURE IN DETAIL:  [name] was brought to the  operating room and correctly identified via his wrist band.  An  appropriate timeout was called to confirm correct patient, procedure and  site.  After the successful induction of general endotracheal  anesthesia, he was positioned supine and his groin was prepped and  draped in the usual sterile fashion.  The surgeons were sterilely gowned  and gloved.   A transverse scrotal incision was made on the left and dissected down  through the skin and dartos fascia and opened the tunica vaginalis.  A  large edematous testicle was identified.  There was no blood flow to the  testicle and it was obviously necrotic.  It was quite evident that this  testicle was nonviable.  The testicle was dissected free from its  surrounding gubernacular attachments, as well as the spermatic cord was  freed up.  We then clamped and divided the spermatic cord several times  and performed a suture ligature using 0 Vicryl.  Excellent hemostasis  was ensured.   Attention was then turned to the right testicle.  A transverse incision  was made in the right hemiscrotum.  We dissected down through skin,  subcutaneous tissue and dartos fascia.  We then opened the tunica  vaginalis.  The right testicle was normal in appearance.  We then  performed an orchiopexy on the right testicle using 3-0 Prolene.  The  right testicle was pexied into place laterally, medially and inferiorly.  Subdartos pouch was created and the testicle was placed in its normal  anatomic position making sure to ensure its correct orientation.  Excellent hemostasis was achieved.  Both wounds were closely evaluated  and there was no evidence of any ongoing bleeding.  Dartos was then  closed using 3-0 Vicryl in a running fashion.  The skin was closed with  a 4-0 Vicryl in a running fashion.  Dermabond was  applied and the  procedure was terminated.  Please note, Dr. Patsi Sears was present,  available and participated in all aspects of this patient's operation.   DISPOSITION:  The patient tolerated the procedure well, was extubated  and transported to the PACU in stable condition.     ______________________________  Edward Qualia      Sigmund I. Patsi Sears, M.D.  Electronically Signed    KR/MEDQ  D:  11/01/2008  T:  11/02/2008  Job:  [id number]
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
## 2010-09-13 NOTE — H&P (Signed)
[name], [name]               ACCOUNT NO.:  [id number]   MEDICAL RECORD NO.:  [id number]          PATIENT TYPE:  INP   LOCATION:  3740                         FACILITY:  [facility]   PHYSICIAN:  Barth Kirks, M.D.  DATE OF BIRTH:  [birth year]   DATE OF ADMISSION:  01/30/2006  DATE OF DISCHARGE:                                HISTORY & PHYSICAL   CHIEF COMPLAINT:  Anasarca failed outpatient therapy.   HISTORY OF PRESENT ILLNESS:  The patient is a 50 year old white male who is  normally seen at [facility] by Dr. Luciana Axe as an unassigned patient who  presents with several-week history of increasing edema now up to his  abdomen.  He has failed outpatient diuretic therapy at [facility] followed  by Dr. Luciana Axe.  He has been recently on Lasix 80 mg b.i.d. as well as  spironolactone 20 mg b.i.d.  The patient has a past medical history  significant for alcoholic cirrhosis and hypothyroidism treated with  radioactive iodine on January 12, 2006, anxiety disorder, seizure disorder  and hypertension.  The patient complained of pain behind his knee secondary  to edema and some dyspnea on exertion with walking from the parking lot.  He  denies any chest pain, no orthopnea, no PND.  A 2-D echo was performed  yesterday which showed normal left ventricular ejection fraction, aortic  valve was mildly increased on his 2-D echo, right atrium was mildly dilated,  peak pulmonary pressures were 45 mmHg, mild to moderate tricuspid valvular  regurgitation.  The patient is currently anxious at this time.  He does have  a positive cough.  He does have some episodes where his heart races but he  does not get dizzy or have syncope.  His baseline weight is 180-190.  He is  currently at 203 pounds.   REVIEW OF SYSTEMS:  Negative for chest pain.  Negative for shortness of  breath at sitting.  Positive dyspnea on exertion.  No blood in his stool.  He has a history of hemorrhoids and history of transfusions due  to GI  bleeding.  He has a history of heart racing and flutters; no caffeine usage.  Denies recent alcohol or drugs.  He does smoke two packs per day.   PAST MEDICAL HISTORY:  1. Alcoholic cirrhosis.  2. Hyperthyroidism with Graves' disease status post radioactive iodine      treatment on January 12, 2006.  3. Hypertension.  4. Seizure disorder on Dilantin.  5. History of anxiety disorder.  6. Depression disorder.  7. Thrombocytopenia.  8. Chronic anemia.  9. Alcoholic relapse in August 2007.  10.Hemorrhoids.   PAST SURGICAL HISTORY:  Appendectomy and skull fractures with treatment 12  years ago.   MEDICATIONS AT HOME:  1. Lasix 80 mg p.o. b.i.d.  2. Spironolactone 25 mg b.i.d.  3. Metoprolol 50 mg p.o. b.i.d.  4. Clonazepam 1 mg p.o. t.i.d.  5. Antabuse 250 mg one p.o. daily.  6. Dilantin 300 mg q.a.m. and q.h.s.   ALLERGIES:  NO KNOWN DRUG ALLERGIES.   FAMILY HISTORY:  His mother had Graves' disease and hypoglycemia and  hyperlipidemia.  His father had diabetes mellitus; he died of a myocardial  infarction and congestive heart failure.  He has one sister [name] who is his  Power of Attorney and her cell phone is [phone] and her home phone is [phone].  She is very reliable.  He has two half sisters.   SOCIAL HISTORY:  He lives with his mother who he takes care of.  She is  blind.  His sister [name] is currently involved in his care and attempts to  give him his medicines.  He smokes a half a pack a day.  He does not use  alcohol; he says he quit a year ago but his last alcohol relapse was 2  months ago.  No drugs.  He is currently not working.   PHYSICAL EXAMINATION:  VITALS:  Temperature is 97, heart rate 89, blood  pressure 153/71, respirations 16, he is 100% on room air.  GENERAL APPEARANCE:  He is anxious but pleasant and tachypneic.  HEENT:  He has dry mucous membranes.  Extraocular muscles are intact.  CARDIOVASCULAR:  Regular rate and rhythm.  No murmurs, rubs,  or gallops.  There is positive JVD to the ear and +2 pulses upper extremities.  LUNGS:  Decreased breath sounds on right with mild crackles.  Left lung is  clear.  ABDOMEN:  Positive distention, positive bowel sounds, nontender, positive  shifting dullness, positive fluid wave.  EXTREMITIES:  Bilateral edema, 1 to 2+ pitting edema up to his abdomen.  No  skin breakdown.  NEURO:  He is alert and oriented x3.  Cranial nerves II-XII are intact.  His  gait is intact.  GU:  He has got scrotal edema.   LABORATORIES:  Currently are pending at time of admission.  EKG was normal  sinus rhythm with a right bundle branch block.  Hemoccult is negative.   ASSESSMENT AND PLAN:  This is a 50 year old with a anasarca, alcoholic  cirrhosis, Graves' disease, seizure disorder.   Problem 1. Anasarca.  He failed outpatient management.  We will admit him,  perform a chest x-ray, a CMET, a BNP and ammonia level.  We will begin Lasix  80 mg IV q.8 and spironolactone 50 mg p.o. b.i.d.  We will replace his  potassium as it is depleted.  We will check a BMET again at 1800 hours and  then in the morning.  We will continue strict I's and O's.  Consider  etiology the cirrhosis versus right heart failure.  We will plan to  eventually diurese him.  We will check a TSH, a Free T3 and a Free T4.   Problem 2. Hyperthyroidism.  Check Free T4 and Free T3, TSH, place on  telemetry.  We will check cardiac enzymes q.8 x3 and recheck an EKG in the  morning.   Problem 3. For his anxiety disorder, we will continue his clonazepam 1 mg  p.o. t.i.d.  We will check an alcohol and a urine drug screen.  Currently  his EKG is normal.  With his history in the past he will be on some type of  benzodiazepine, however, he may need to be on the Ativan protocol if deemed  necessary.   Problem 4. Seizure disorder.  We will continue __________ Dilantin 300 mg  b.i.d.  Problem 5. Thrombocytopenia and anemia.  This is currently  supposedly his  baseline due to his __________ we are checking his laboratories at this  time.  We will check also PT  and INR and PTT.   Problem 6. Hypertension.  We will continue his metoprolol 50 mg p.o. b.i.d.  We will hold this with diuretic therapy if he continues to worsen.   Problem 7. Deep vein thrombosis prophylaxis.  We are going to do TED hose  bilaterally and ambulate as tolerated.   Problem 8. Gastrointestinal prophylaxis.  We will give Protonix 40 mg IV  daily.   Problem 9. Code status.  We will do Full.   Problem 10. Social work who is helping with no insurance and assistance with  programs.  Please call his sister, [name] _____________ cell [phone] and home  [phone] problem medical update.      Barth Kirks, M.D.     MB/MEDQ  D:  01/30/2006  T:  01/31/2006  Job:  [id number]   cc:   Dr. Luciana Axe, [facility]

## 2010-09-13 NOTE — Op Note (Signed)
[name], [name] NO.:  [id number]   MEDICAL RECORD NO.:  [id number]          PATIENT TYPE:  AMB   LOCATION:  DAY                          FACILITY:  [facility]   PHYSICIAN:  Charlynne Pander, D.D.S.DATE OF BIRTH:  [birth year]   DATE OF PROCEDURE:  08/13/2006  DATE OF DISCHARGE:                               OPERATIVE REPORT   SURGEON:  Charlynne Pander, D.D.S.   PREOPERATIVE DIAGNOSES:  1. End-stage liver disease.  2. Pre-liver transplant dental protocol.  3. Thrombocytopenia.  4. Chronic periodontitis.  5. Chronic apical periodontitis.  6. Multiple retained root segments.  7. Bilateral hyperplastic maxillary tuberosities.  8. Bilateral mandibular lingual tori.   POSTOPERATIVE DIAGNOSES:  1. End-stage liver disease.  2. Pre-liver transplant dental protocol.  3. Thrombocytopenia.  4. Chronic periodontitis.  5. Chronic apical periodontitis.  6. Multiple retained root segments.  7. Bilateral hyperplastic maxillary tuberosities.  8. Bilateral mandibular lingual tori.   PROCEDURES:  1. Extraction of remaining teeth #s 3, 4, 5, 6, 7, 8, 9, 10, 11, 12,      13, 14, 19, 20, 21, 22, 23, 24, 25, 26, 27, 28, 29, 30 and 31.  2. Four quadrants of alveoloplasty.  3. Bilateral maxillary tuberosity reductions.  4. Bilateral mandibular lingual tori reductions.   SURGEON:  Verlin Grills, D.D.S.   ASSISTANT:  Elliot Dally (Sales executive).   ANESTHESIA:  General anesthesia via nasal endotracheal tube.   FLUIDS:  900 mL lactated Ringer's solution.   ESTIMATED BLOOD LOSS:  Less than 100 mL.   MEDICATIONS:  1. Ancef 1 g IV prior to invasive dental procedures.  2. Local anesthesia with a total utilization of 6 carpules each      containing 36 mg of Xylocaine with 0.018 mg of epinephrine as well      as 3 carpules each containing 9 mg of bupivacaine with 0.009 mg of      epinephrine.   SPECIMENS:  There were 25 teeth which were discarded.   CULTURES:   None.   DRAINS:  None.   COMPLICATIONS:  None.   INDICATIONS:  Patient was diagnosed with end-stage liver disease.  The  patient was seen part of a pre-liver transplant dental protocol  evaluation.  The patient was examined and treatment planned for  extraction of remaining teeth with alveoloplasty and preprosthetic  surgery as indicated.  This treatment plan was formulated due to  decreasing the risks and complications associated with dental infection  from affecting the patient's systemic health and anticipated liver  transplant surgery.   OPERATIVE FINDINGS:  The patient was examined in operating room #4.  The  teeth were identified for extraction.  The patient was noted to be  affected by chronic periodontitis, chronic apical periodontitis,  multiple retained root segments, hyperplastic maxillary tuberosities,  and bilateral mandibular lingual tori.  The aforementioned necessitated  the removal of all remaining teeth with alveoloplasty, bilateral  mandibular lingual tori reductions, and bilateral maxillary tuberosity  reductions.   DESCRIPTION OF PROCEDURE:  The patient was brought to the main operating  room #4.  The  patient was then placed in the supine position on the  operating room table.  General anesthesia was induced with a nasal  endotracheal tube.  Patient was then prepped and draped in the usual  manner for a detail medicine procedure.  A time-out was then performed  and the patient was identified and procedures verified.  The oral cavity  was thoroughly examined with the findings noted above.  The patient was  then readied for the dental medicine procedure as follows.   A throat pack was placed at this time.  Local anesthesia was  administered with a total utilization of 6 carpules each containing 36  mg of Xylocaine with 0.018 mg of epinephrine as well as 3 carpules each  containing 9 mg of bupivacaine with 0.009 mg of epinephrine.  Local  anesthesia was  administered sequentially over the 2 hour long procedure.   The maxillary quadrants first approached, anesthesia was delivered with  infiltration utilizing Xylocaine with epinephrine.  A 15 blade incision  was made from the maxillary right tuberosity and extended to the  maxillary left tuberosity.  A surgical flap was then carefully  reflected.  Appropriate amounts of buccal and interseptal bone was  removed at this time.  The teeth were subluxated with a series of  straight elevators.  The roots of tooth #3 were then removed with a  Cryers elevator.  Tooth #s 4, 5, 6, 7, 8, 9, 10, 11, 12, 13 were then  removed with a 150 forceps without complications.  Tooth #14 then had  the coronal segment removed with a 53L forceps.  A surgical handpiece  was then utilized to section the roots appropriately.  These roots were  then elevated out with a Engineer, agricultural.  Alveoloplasty was then  performed utilizing a rongeurs and bone file.  The bilateral maxillary  tuberosity reductions were then achieved utilizing a 15 blade and soft  tissue pickups to remove the excessive tissue.Both quadrants were then  irrigated with copious amounts of sterile saline.  The tissues were  approximated and trimmed appropriately.  The surgical sites were then  closed as follows.  A maxillary right quadrant was then closed from the  maxillary right tuberosity and extended to the mesial of #8 utilizing #3-  0 chromic cut suture in a continuous interrupted suture technique x1.  The maxillary left quadrant was then closed from the maxillary left  tuberosity and extended to the mesial of #9 utilizing #3-0 chromic cut  suture in a continuous interrupted suture technique x1.  Three  interrupted sutures were then further utilized to close the maxillary  left surgical site.   At this point in time, the mandibular quadrants were approached, the  patient was given bilateral inferior alveolar nerve blocks utilizing  the bupivacaine with epinephrine.  Further infiltration was then achieved  utilizing the Xylocaine with epinephrine.  The mandibular right quadrant was then approached.  A 15 blade incision  was made from the distal of #32 and extended to the distal of #18.  A  surgical flap was then carefully reflected.  Appropriate amounts of  buccal and interseptal bone were removed at this time.  The teeth were  then subluxated with a series of straight elevators.  Tooth #s 30 and 31  were then removed with a 23 forceps without complications.  Tooth #s 29,  28, 27, 26, 25, 24, 23, 22, 21, 20 were then removed with a 151 forceps  without complications.  Tooth #19 was then  removed with a 17 forceps  without complications.  At this point in time alveoloplasty was  performed utilizing rongeurs and bone file.  The surgical flap was then  further reflected to expose the bilateral mandibular lingual tori.  These tori were then removed utilizing a surgical handpiece and bur and  copious amounts of sterile saline.  Alveoloplasty was then again  performed utilizing rongeurs and bone trial.  The surgical sites were  then irrigated with copious amounts of sterile saline x4.  The tissues  were then approximated and trimmed appropriately.  The mandibular left  quadrant was then closed from the distal of #18 and extended to the  mesial of #24 utilizing #3-0 chromic gut suture in a continuous  interrupted suture technique x1.  The mandibular right quadrant was then  closed from the distal of #32 and extended to the mesial of #25  utilizing #3-0 chromic gut suture in a continuous interrupted suture  technique x1.  One additional interrupted suture was placed to further  close the surgical site.   At this point in time the entire mouth was irrigated with copious  amounts of sterile saline.  The patient was examined for complications,  seeing none, the dental medicine procedure was deemed to be complete.  The throat  pack was removed at this time.  A series of 4 x 4 gauze were  placed in the mouth to aid hemostasis.  An oral airway was placed at the  request of the anesthesia team.  The patient was then handed over to the  anesthesia team for final disposition.  After the appropriate amount of  time, the patient was extubated and taken to the Post Anesthesia Care  Unit with stable vital signs and good oxygenation level.  All counts  were correct for dental medicine procedure.  The patient will be given  appropriate pain medication.  The patient will be given an Amicar 5%  rinse, rinsing with 10 mL every hour for the next 10 hours to maintain  hemostasis.  The patient will then be seen in approximately 1 week for  evaluation for suture removal.  Patient is to contact Dental Medicine if  acute bleeding arises or is to follow up with the emergency room, as  indicated, for persistent bleeding due to his history of end-stage liver  disease and thrombocytopenia.      Charlynne Pander, D.D.S. Electronically Signed     RFK/MEDQ  D:  08/13/2006  T:  08/13/2006  Job:  [id number]   cc:   Fanny Dance. Rankins, M.D.  Fax: [phone]   Petra Kuba, M.D.  Fax: [phone]

## 2010-09-13 NOTE — Discharge Summary (Signed)
[name], [name] NO.:  [id number]   MEDICAL RECORD NO.:  [id number]          PATIENT TYPE:  OBV   LOCATION:  4743                         FACILITY:  [facility]   PHYSICIAN:  Benn Moulder, M.D.      DATE OF BIRTH:  12/29/1960   DATE OF ADMISSION:  12/20/2005  DATE OF DISCHARGE:                                 DISCHARGE SUMMARY   #[id number] - [facility]   ADMISSION DATE:  December 18, 2005.   DISCHARGE DATE:  December 20, 2005.   ATTENDING ON ADMISSION:  Dr. Sheffield Slider with the family practice teaching service.   ATTENDING AT DISCHARGE:  Dr. Mardelle Matte with the family practice teaching service.   PATIENT'S PRIMARY CARE PHYSICIAN:  Dr. Barbaraann Barthel with [facility].   CONSULTANT:  Dr. Jeanie Sewer with psychiatry.   DISCHARGE DIAGNOSES:  1. Overdose of Seroquel, clonazepam.  2. Hyperthyroidism.  3. Tobacco abuse.  4. Alcohol abuse.  5. Seizure disorder.  6. Hypertension.  7. Depression.   INITIAL LABORATORY DATA:  Creatinine  0.4, electrolytes within normal  limits.  White blood cell count 2.8, hemoglobin 10.7, hematocrit 30.8,  platelet count 72, blood alcohol level elevated at 262 mg per dl.  Urine  drug screen negative for amphetamines, barbiturates, benzodiazepines,  cocaine and opioids and marijuana.  TSH low at 0.005.  PT mildly elevated at  16.5.  INR 1.3.  PTT within normal limits.  Ammonia level 40, dilantin low  at 6.2.  Total bilirubin 3.3, alk-phos 237, AST 59, ALT 26.   OTHER LABORATORY DATA DURING ADMISSION:  Free-T4 elevated at 6.2, T3 uptake  elevated at 67.7.  CBC at discharge:  White blood cell count 3.1, hemoglobin  10.3, hematocrit 29.6, platelet count 69.  Total bilirubin 3.6, alk-phos  251, AST 57, ALT 23.   BRIEF HISTORY OF PRESENT ILLNESS:  The patient is a 50 year old male with  history of prior suicidal ideations since stay at [facility], who presented to  the ER after taking excess Seroquel 50 mg x3 as well as clonazepam 1 mg,  taking 9 or 10 pills earlier  in the day.  He had spoken with his sister and  told her that I wanted to tell her good-bye.  Patient is also found with 2  empty beer cans in the room.  He has a history of seizure disorder,  alcoholism, tobacco abuse, depression, hypertension, and anemia.  On  admission patient's vital signs were stable.  He was mildly drowsy on  admission and was denying that this was a suicide attempt but rather took  extra medicine in attempt to help him sleep.  The patient's blood alcohol  level was elevated.  The patient also noted to be tremulous.  He also was  noted to have a goiter on physical exam.   HOSPITAL COURSE:  1. Overdose of Seroquel and clonazepam.  Patient's Seroquel was      discontinued.  Poison Control was contacted and they recommended      observing the patient for 6-8 hours on telemetry.  Patient's vital      signs stayed stable and he  had no events on telemetry.  2. Goiter.  Patient was noted to have a goiter with his right side larger      than his left side and diffuse thyroid enlargement on physical exam.  A      TSH was checked that was low and he had elevated free-T4 as well as T3      uptake.  Patient with likely Grave's disease.  Patient has radioactive      iodine uptake test pending which he will receive as an outpatient on      Monday, December 22, 2005.  Patient notes many symptoms of      hyperthyroidism such as tremor, episodic diarrhea, as well as weight      loss.  Patient's metoprolol was increased to 50 mg b.i.d. while he was      in the hospital and at the time of discharge his symptoms were improved      with his pulse in the 70s.  3. Tobacco abuse.  Patient smokes 2 packs per day since he was 7-years-      old.  He did have a smoking cessation consult while in the hospital.  A      history with the nicotine patch while in the hospital.  4. A questionable suicide attempt.  Patient was seen by social work and he      vehemently denied suicide attempt.  Dr.  Jeanie Sewer from psychiatry was      consulted.  He was evaluated.  The patient had been placed on a sitter      at admission and Dr. Jeanie Sewer felt that the patient was stable enough      to have his sitter discontinued.  Patient denied suicidal or homicidal      ideations throughout his hospital  course and at the time of discharge.  5. Thrombocytopenia.  Patient's platelets low throughout his hospital      course.  It is presumed secondary to chronic alcohol use.  6. Transaminitis.  Patient's bilirubin mildly elevated as well as his AST.      This also likely corresponds with chronic alcohol use.  7. Seizure disorder.  Patient was subtherapeutic on dilantin.  He was re-      started on dilantin 400 mg once daily and before bed.  Patient with no      seizure events while in the hospital.  8. Hypertension.  Patient maintained on metoprolol b.i.d. while in the      hospital.  His discharge  dose is 50 mg by mouth 2 times daily.      Patient's blood pressure stable throughout hospital course.  9. Anxiety.  Patient initially started on Ativan protocol given his      history of alcohol abuse.  This was discontinued as he stayed stable.      He was re-started on clonazepam 1 mg by mouth b.i.d. as needed.      Patient's symptoms were under control at this dose.   DISCHARGE MEDICATIONS:  1. Dilantin 400 mg p.o. q.h.s.  2. Metoprolol 50 mg p.o. b.i.d.  3. Clonazepam 1 mg p.o. b.i.d. p.r.n.  4. Patient was given a 1 week supply of all of these medications on      discharge.   FOLLOWUP:  1. Dr. Barbaraann Barthel at [facility], her number is [phone].  Patient to follow      up on August 27 or December 23, 2005.  2. Followup with patient's psychiatrist, patient had  a previously      scheduled appointment on Monday, December 22, 2005.  The patient is to      keep this appointment.   FOLLOWUP ISSUES:  1. Making sure patient's thyroid symptoms are under control. 2. Patient also needs to have his radioactive  iodine uptake test completed      as an outpatient.  3. These results need to be followed as well.  4. Smoking cessation.   DISCHARGE DIET:  As tolerated.   DISCHARGE ACTIVITY:  No restrictions.   DISCHARGE INSTRUCTIONS:  1. Patient is to seek medical attention for worsening anxiety, depression,      tremors, symptoms of hyperthyroidism.  2. Patient also to seek urgent medical attention for any suicidal or      homicidal ideations.      Benn Moulder, M.D.     MR/MEDQ  D:  12/20/2005  T:  12/20/2005  Job:  [id number]   cc:   Rankins, Dr.

## 2010-09-13 NOTE — Discharge Summary (Signed)
[name], Cook               ACCOUNT NO.:  [id number]   MEDICAL RECORD NO.:  [id number]          PATIENT TYPE:  INP   LOCATION:  3740                         FACILITY:  [facility]   PHYSICIAN:  Norton Blizzard, M.D.    DATE OF BIRTH:  1960/08/04   DATE OF ADMISSION:  01/30/2006  DATE OF DISCHARGE:  02/03/2006                                 DISCHARGE SUMMARY   PRIMARY CARE PHYSICIAN:  Dr. Luciana Axe.   DISCHARGE DIAGNOSES:  1. Anasarca.  2. History of hyperthyroidism.  3. Cirrhosis.  4. Anxiety.  5. Pancytopenia.  6. Seizure disorder.  7. Hypertension.   CONSULTS:  None.   PROCEDURES:  1. Chest x-ray, which showed cardiomegaly, moderate vascular congestion      and a small right pleural effusion.  2. Echocardiogram, which had an EF of 65%, pulmonary artery systolic      pressures were elevated at 45 mmHg, mild to moderate tricuspid      regurgitation, left ventricular wall mild increase in thickness, aortic      valve mild increase in thickness, left atrium moderately dilated.  3. EKG, normal sinus with a right bundle branch block.   H&P:  In brief, this is a 50 year old male who presented with increased  edema up to his abdomen over a period of several weeks that was not  responding to outpatient therapy.  He had been on Lasix 80 mg p.o. b.i.d.  and Aldactone 25 mg p.o. b.i.d. with stable edema.  He, on admission, was  increased to about 15 pounds above his baseline at a weight of 203 pounds.  He was admitted for diuresis.   DISCHARGE LABS:  Hemoglobin A1c 4.5, BNP was 2694, down to 2087, magnesium  1.9, potassium 3.8, hemoglobin 8.7, reticular site count 0.9%, platelet  count 59, white blood cell count 2.5 (hemoglobin, platelets and white blood  cell count were stable throughout stay), free dilantin 1.63 which is normal,  ammonia 37 slightly elevated, folate increased at 956, B12 at 575, ferritin  183, INR 1.3, TSH less than 0.004, free T4 6.5, T3 uptake 57.2%.  Cardiac  markers negative x3.  Stool hemoccult negative.  UDS negative.  Urinalysis  negative for infection or blood.  Alcohol less than 5.  AST 42, ALT 18.   HOSPITAL COURSE:  1. Anasarca.  Etiology due to alcohol abuse with cirrhosis.  We are      considering high output heart failure, but there is no evidence of this      on the echocardiogram that was done as an inpatient.  On admission, he      had 1 to 2+ pitting edema up to his abdomen on exam, which had      decreased up to the mid thigh on discharge.  He had been placed on      Lasix 80 mg t.i.d. and spironolactone 50 p.o. b.i.d.  We will replace      his potassium daily as needed to maintain a level above 3.5.  He had      excellent diuresis on this regimen and his weight was  down to 186.7 on      discharge after 5 days in the hospital.  We had changed him, on      discharge, to 80 mg t.i.d. on Lasix and spironolactone 50 mg b.i.d.  He      had no tele events and his cardiac markers were negative x3, so a heart      attack did not precipitate this event.  His BNP was elevated, but this      can also simply be due to increased intravascular volume and distention      of the heart having to pump increased fluids.  He had no findings of      interstitial edema on his chest x-ray.  2. History of hyperthyroidism.  Patient is status post radioactive iodine      ablation treatment in September.  His TSH was barely detectable at less      than 0.004.  His free T4 was slightly elevated at 6.5.  His T3 uptake      was elevated at 57.2%.  All of these labs are consistent with ablation      treatment and this will take up to 4 to 6 weeks post treatment to      resolve.  He will likely need Synthroid treatment as an outpatient once      these levels normalize.  3. Cirrhosis secondary to alcohol abuse.  He had increased      ___________slightly, so we continued his lactulose to be held for      greater than 3 loose stools per day.  We continued his  multivitamin,      thiamine and folate.  It was felt his anasarca was secondary to liver      congestion and alcoholic cirrhosis.  4. Anxiety.  Patient's sister remarked that he was better when taking      Ativan as compared to clonazepam, as far as his anxiety was concerned.      We had changed him over to Ativan 1 mg b.i.d. for anxiety and to      continue this as an outpatient.  5. Pancytopenia.  Felt to be secondary to end-stage liver disease.  Set up      outpatient followup with Dr. Ewing Schlein to work this up further.  His white      blood cell count, hemoglobin and platelets were all low, but stable      throughout this stay.  At discharge, his platelet count was 59, white      blood cell count 2.5, hemoglobin 8.7.  He is not making very many      reticular sites as this count is 0.9%.  He is not B12 or folate      deficient.  His ferritin level is noted to be 183, so maybe component      of chronic disease secondary to his alcohol abuse as noted previously.      We will refer to Dr. Ewing Schlein as previously stated to see if any further      workup is required.  6. Seizure disorder.  We will continue the patient's dilantin at 300 mg      b.i.d.  His free dilantin level was therapeutic.  7. Hypertension.  Well controlled throughout stay.  We continued his      metoprolol 50 mg b.i.d.   DISCHARGE MEDICATIONS AND INSTRUCTIONS:  1. Lactulose 15 ml daily for his increase ammonia.  2. K-Dur 20 mEq b.i.d.  3. Multivitamin one daily.  4. Ativan 1 mg b.i.d.  5. Metoprolol 50 mg b.i.d.  6. Lasix 80 mg t.i.d.  7. Spironolactone 50 mg b.i.d.  8. Dilantin 300 mg b.i.d.  9. Patient to call MD for increased swelling, decreased responsiveness to      medications or any other concerns.   DISCHARGE CONDITION:  Good, improved.   FOLLOWUP:  With Dr. Ewing Schlein in 1 to 2 weeks and also with Dr. Luciana Axe on  October 15 at 9 a.m.   ISSUES FOR FOLLOWUP: 1. Consider from changing from Lasix to torsemide for  increased absorption      when taking p.o. in cirrhotic patients compared to Lasix.  2. Change Lasix as needed for his anasarca.  3. Check an BMP to monitor his potassium.  4. Questionable workup for pancytopenia.           ______________________________  Norton Blizzard, M.D.     SH/MEDQ  D:  03/09/2006  T:  03/10/2006  Job:  [id number]   cc:   Benetta Spar R. Rankins, M.D.  Petra Kuba, M.D.

## 2010-09-13 NOTE — Consult Note (Signed)
NAME:  [name], RHUE NO.:  [id number]   MEDICAL RECORD NO.:  [id number]          PATIENT TYPE:  OBV   LOCATION:  4743                         FACILITY:  [facility]   PHYSICIAN:  Antonietta Breach, M.D.  DATE OF BIRTH:  November 04, 1960   DATE OF CONSULTATION:  12/19/2005  DATE OF DISCHARGE:                                   CONSULTATION   REFERRING PHYSICIAN:  Asencion Partridge, M.D.   REASON FOR CONSULTATION:  Alcohol and polysubstance overdose.   [name] is a 50 year old single male admitted to [facility]  on December 18, 2005 after an overdose of alcohol and multiple medications.   The patient's blood alcohol level, in the emergency room, was over 200.  He  does not recall any of the period after he began drinking.  He states that  he had not consumed alcohol for a year prior to his binge just prior to  admission.  He does not know the amount that he drank.  By report, he took  several pills of Seroquel and Klonopin.  He was noted by family members to  say that he wanted to say goodbye and that he no longer wanted to live.  He  adamantly insists that he does not recall saying these things.  He has a  complete blackout for the time that he became alcohol intoxicated all the  way to the point of waking up in the hospital.   [name] describes several constructive goals of the future.  He has  interests, which included taking care of his mother (he resides with her).  He does not have any thoughts of harming himself,  has no thoughts of  harming others.  He has no hallucinations or delusions.  He is not  exhibiting any alcohol withdrawal.  He is socially appropriate and  cooperative with care.   PAST PSYCHIATRIC HISTORY:  Mr. Mayorga has a history of psychiatric  hospitalization at [facility] for alcohol intoxication and dependence.  He is  followed by the [facility] for severe anxiety.  He  denies any history of hallucinations or  delusions.  The patient's  psychiatrist has prescribed Seroquel and Klonopin for his anxiety.  The  patient denies any history of decreased need for sleep, racing thoughts, or  euphoria.  He also denies any history of multiple weeks of decreased energy  along with depressed mood and poor concentration.  He denies any history of  suicide attempts.   FAMILY PSYCHIATRIC HISTORY:  None.   SOCIAL HISTORY:  The patient did not finish high school.  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  He has no children, has never been  married.  He is in between girlfriends and lives with his mother, who is  medically disabled.   The patient acknowledges and extensive history of alcohol dependence.  He  has not been attending 12-step meetings.  He has a number of general medical  sequelae.   GENERAL MEDICAL PROBLEMS:  Patient has a seizure disorder and has been  treated with dilantin.  He has alcoholic liver disease.   CBC remarkable for a white count of 3.1, hemoglobin 10.1, platelets  decreased at 65,000.  The INR is 1.3.  Complete metabolic panel shows an  elevated SGOT at 61, SGPT at 23, albumin at 2.4.  The ammonia level is at  40.  Tylenol negative.  Urine drug screen negative.  Alcohol level on  emergency room evaluation was 262.   REVIEW OF SYSTEMS:  Noncontributory.   PHYSICAL EXAMINATION:  VITAL SIGNS:  Temperature 99.3, pulse 101,  respirations 22, blood pressure 148/72, O2 saturation on room air 97%.  MENTAL STATUS EXAM:  [name] is a middle-aged male sitting up on his  hospital bed with good eye contact.  His speech exhibits normal rate and  prosody.  His fund of knowledge and intelligence are average.  He is  oriented to all spheres.  Memory is intact to immediate, recent, and remote  except for the alcohol and overdose blackout as described above.  Thought  process is logical, coherent, goal directed.  No looseness of associations.  Thought content:  No thoughts of harming  himself, no thoughts of harming  others.  No delusions, no hallucinations.  Affect is broad and appropriate.  Concentration is within normal limits.  Insight is partial.  He acknowledges  that he has alcohol dependence, but overestimates his ability to remain  abstinent by himself.  However, he will think about 12-step community  involvement.  Judgment is intact.   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  AXIS II:  Deferred.  AXIS III:  See general medical problems.  AXIS IV:  Primary support group.  AXIS V:  55.   [name] is no longer at risk to harm himself or others now that he has  recovered from his intoxicated state.  He agrees to use emergency services  for thoughts of harming himself, thoughts of harming others, or severe  distress.   He has an appropriate level of regret about his relapse.  He realizes that  the alcohol impaired his judgment, cause him to go into an amnesia, and  increased his impulsivity.   RECOMMENDATIONS:  1. Inpatient alcohol rehabilitation program of the residential type.      However, the patient declines.  If he changes his mind, call [phone]      for available facilities.  2. However, the patient does opt for outpatient ADS.  Would ask the case      manager to set the patient up with an outpatient ADS appointment within      the first week of discharge.  3. Would recommend that the patient contact an AA sponsor and attend      meetings.  The patient declines at this time, but will think about the      12-step program.  Would discourage the patient from taking      benzodiazepines due to his history of dependence.  4. Seroquel is sometimes utilized with anxiety conditions when the patient      has not responded to SSRIs and has risk with benzodiazepines.  While      the Seroquel is not first line for anxiety and does have adverse,      potential long-term effect, the patient could be continued on the      Seroquel on the short run with the recommendation that his outpatient      facility try him off of it while  pursuing possibly SSRI therapy along      with cognitive behavioral therapy, deep breathing, and progressive      muscle relaxation.  5. Recommend discontinuing the sitter.      Antonietta Breach, M.D.  Electronically Signed     JW/MEDQ  D:  12/19/2005  T:  12/20/2005  Job:  [id number]

## 2010-09-13 NOTE — H&P (Signed)
NAME:  [name], [name]               ACCOUNT NO.:  [id number]   MEDICAL RECORD NO.:  [id number]          PATIENT TYPE:  OBV   LOCATION:  4743                         FACILITY:  [facility]   PHYSICIAN:  _________ Gavin Potters      DATE OF BIRTH:  10-Oct-1960   DATE OF ADMISSION:  12/18/2005  DATE OF DISCHARGE:                                HISTORY & PHYSICAL   PRIMARY CARE PHYSICIAN:  Turkey R. Rankins, M.D. at [facility]   CHIEF COMPLAINT:  Overdose.   HISTORY OF THE PRESENT ILLNESS:  This is a 50 year old male with a history  of prior suicidal ideation and had a stay in [place].  The patient overdosed  on Seroquel 50 mg tablets times three and clonazepam 1 mg pills times nine  or 10 at 12:15 on the day of admission.  He had talked to his sister at 8  A.M. and seemed sleepy.  The patient told his mother to tell his sister  Tell [name] I wanted to tell her goodbye.  Later the mother called the  sister [name] and told her that the patient has taken all of his meds.  [name]  came over and he was talking nonsensical.  Later he told her I don't want  to live no more, I'm tired.  His sister fills his medication bottles every  Sunday with one week's worth of medication. There was also to be two empty  beer cans in the room.   PAST MEDICAL HISTORY:  1. Depression with suicidal ideation admitted to [facility] about five years      ago.  2. Hypertension.  3. Alcoholism; quit 1.5 years ago.  4. Tobacco abuse of two packs/day since age 23.  5. Seizure disorder.  6. Anemia due to hemorrhoids  status post multiple transfusions.   ALLERGIES:  None.   MEDICATIONS:  1. Dilantin; unknown dose.  2. Toprol; unknown dose.  3. Seroquel 50 mg p.o. at bedtime.  4. Clonazepam 1 mg 1.5 tabs p.o. twice a day.   SOCIAL HISTORY:  The patient denies drug use.  He lives with his mother.  He  does not currently works.   FAMILY HISTORY:  Father died secondary to alcohol and diabetes mellitus.  Mother is alive with  anemia and hyperthyroidism.   PHYSICAL EXAMINATION:  VITAL SIGNS: Temperature 98.7, pulse 100, blood  pressure 147/64, pulse 18, and pulse ox 100% on room air.  HEENT:  Head, eyes, ears, nose and throat are atraumatic; however, foul  breath and poor dentition.  Oropharynx is clear.  Tympanic membranes are  clear.  NECK:  The neck has no lymphadenopathy.  HEART:  Cardiovascular with regular rate and rhythm.  No murmurs.  LUNGS:  The lungs are clear to auscultation bilaterally with decreased  breath sounds bilaterally.  ABDOMEN:  The abdomen is soft, nontender and nondistended with positive  bowel sounds.  EXTREMITIES:  The extremities have no cyanosis, clubbing or edema.  The left  hand has a cut on the palmar surface, which is positive erythema and there  is a dirty bandage overlying the area, which was  removed.   LABORATORY DATA:  Urine drug screen was negative.  White blood cell count  2.8 with lymphocytes at 58%, absolute neutrophil count 0.9, hemoglobin 10.7,  platelets 72,000, and MCV 84.8.   ASSESSMENT AND PLAN:  This is a 50 year old male with:  1. Overdose of Seroquel and clonazepam.   Poison control was called and suggested to observe the patient for six to  eight hours on telemetry with a sitter; and, we wild consult psychiatry in  the morning.   1. Confusion.   This may be secondary to the overdose, but given his alcohol history and an  enlarged liver we will check an ammonia level and alcohol level.   1. Pancytopenia.   We will check liver function tests, coagulants and human immunodeficiency  virus.  This is likely due to alcohol abuse, but we will follow up on these  laboratories.   1. Goiter.   We will check a thyroid stimulating hormone, free T4 and T3.   1. Seizure disorder.   We will continue dilantin and check the level.   1. Hypertension.   We will change Toprol to metoprolol.     ______________________________  Norton Blizzard, M.D.     ______________________________  _________ Berton Bon  D:  12/19/2005  T:  12/20/2005  Job:  [id number]

## 2010-09-13 NOTE — H&P (Signed)
NAME:  [name], [name] NO.:  [id number]   MEDICAL RECORD NO.:  [id number]          PATIENT TYPE:  OBV   LOCATION:  4743                         FACILITY:  [facility]   PHYSICIAN:  Rolm Gala, M.D.    DATE OF BIRTH:  May 11, 1960   DATE OF ADMISSION:  12/18/2005  DATE OF DISCHARGE:                                HISTORY & PHYSICAL   PRIMARY CARE PHYSICIAN:  Dr. Luciana Axe at [facility].   CHIEF COMPLAINT:  His sister brought him in for an overdose.   HISTORY OF PRESENT ILLNESS:  The patient is a 50 year old male with a  history of prior suicidal ideation and stay in [place], who overdosed on  Seroquel 50 mg x3 pills, and Clonazepam 1 mg pills, he took under 10 at  12:15 p.m. today. He had talked to his sister at 8:00 a.m. Seemed sleepy and  told mother to tell the sister tell [name] I wanted to tell her goodbye.  Later, mother called sister [name], and told her that he had taken all of his  medications. [name] came over and he was talking nonsensical. Later he told  her I don't want to live no more; I'm tired. The sister fills medication  bottles every Sunday with 1 week worth of medications. There was also 2  empty beer cans when she found him.   PAST MEDICAL HISTORY:  1. Depression with suicidal ideation. He went to [place] about 5 years      ago.  2. Hypertension.  3. Alcoholism, quit 1.5 years ago.  4. Tobacco 2 pack per day since 50 years old.  5. Seizure disorder.  6. Anemia due to hemorrhage status post multiple transfusions.   SOCIAL HISTORY:  No drugs. Lives with mother. Does not work.   FAMILY HISTORY:  Dad died at 63 of alcohol, diabetes. Mother with anemia,  hypothyroidism, low blood sugars.   ALLERGIES:  NO KNOWN DRUG ALLERGIES.   MEDICATIONS:  1. Dilantin, unknown dose but patient had a full bottle.  2. Toprol, unknown dose. The patient had a full bottle.  3. Seroquel 50 mg p.o. q.h.s., which is empty.  4. Clonazepam 1 mg, take 1.5 p.o. b.i.d.,  which is also empty.   PHYSICAL EXAMINATION:  VITAL SIGNS:  Temperature 98.7, pulse 100, blood  pressure 147/64, respiratory rate 18. Oxygen saturation 100% on room air.  HEENT:  Atraumatic. Foul breath with poor dentition. Oropharynx clear.  NECK:  No lymphadenopathy. Positive for thyromegaly.  CARDIOVASCULAR:  Regular rate and rhythm. No murmurs.  LUNGS:  Clear to auscultation bilaterally with decreased breath sounds  bilaterally.  ABDOMEN:  Soft, nontender, and nondistended. Positive bowel sounds.  EXTREMITIES:  No clubbing, cyanosis, or edema. Left hand with cut on palmar  surface. Positive erythema. Dirty bandage, which I took off.   LABORATORY DATA:  UDS negative. White blood cells 2.8. Hemoglobin 10.7.  Platelets 72,000. MCV 84.8. ALC 58% and AMC 0.9.   ASSESSMENT/PLAN:  1. A 50 year old male with overdose of Seroquel, Clonazepam. Called Poison      Control. They suggested that we observe him for 6-8  hours on telemetry      with a sitter. Will consult psychiatry in the morning.  2. Confusion. May be secondary to overdose but given alcohol and history      of an enlarged liver will check ammonia level.  3. Pancytopenia. Will check liver function studies, PT, and INR. Check an      HIV and get a differential and a smear. No concern for leukemia but      most likely secondary to liver failure.  4. Goiter. Check TSH and free T4.  5. Seizure disorder. Continue Dilantin and check level.  6. Hypertension. Change Toprol to metoprolol here.  7. Prophylaxis, none.      Rolm Gala, M.D.     Bennetta Laos  D:  12/20/2005  T:  12/20/2005  Job:  [id number]
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
## 2010-09-13 NOTE — Op Note (Signed)
NAMEJASPREET, HOLLINGS NO.:  [id number]   MEDICAL RECORD NO.:  [id number]                   PATIENT TYPE:  INP   LOCATION:  1825                                 FACILITY:  [facility]   PHYSICIAN:  Thornton Park. Daphine Deutscher, M.D.             DATE OF BIRTH:  1960-11-10   DATE OF PROCEDURE:  03/25/2003  DATE OF DISCHARGE:                                 OPERATIVE REPORT   PREOPERATIVE DIAGNOSIS:  The patient fell off a ladder, lacerating his right  medial leg along the gastrocnemius.   POSTOPERATIVE DIAGNOSIS:  The patient fell off a ladder, lacerating his  right medial leg along the gastrocnemius. Final diagnosis, complex deep  laceration of the right medial calf, status post  irrigation, mild  debridement and closure.   SURGEON:  Thornton Park. Daphine Deutscher, M.D.   ANESTHESIA:  General.   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  when he fell, lacerating his right calf. He said he had not eaten at all  during the day but did have some beer to drink. He presented first to an  emergent facility in [place] and was referred to [facility].  He was seen by me in the emergency room where the area was cleaned with  peroxide and irrigated and then I examined it and felt that the  gastrocnemius was likely intact, but there was a big flap and this would be  best irrigated and closed in the operating room.   DESCRIPTION OF PROCEDURE:  The patient was taken to room 16 and given  general anesthesia. The flaps were irrigated, first by cleaning with  Betadine solution and then irrigating with some double antibiotic solution  and saline.   This appeared  to be good and clean and clear, and I elected to close this  with 4-0 Vicryl subcutaneously, approximating the flaps, and then  subcuticularly. I then closed the skin with staples and applied a  compressive dressing  with an Ace bandage over 4 x 4s.   The patient tolerated the procedure well. He was taken to the recovery room.  He will be given Keflex 500 mg b.i.d. to take for 7 days and Vicodin  for  pain. He is asked to do crutch walking for about 3 days and as needed  thereafter and to remove his bandage on Monday, shower and apply Neosporin  to the staple line. Staple removal should occur around April 04, 2003, and  he is given our phone number at [facility] to call for an appointment.  Thornton Park Daphine Deutscher, M.D.    MBM/MEDQ  D:  03/25/2003  T:  03/25/2003  Job:  [id number]

## 2010-09-27 ENCOUNTER — Ambulatory Visit: Payer: Self-pay | Admitting: Internal Medicine

## 2010-10-01 ENCOUNTER — Emergency Department (HOSPITAL_COMMUNITY)
Admission: EM | Admit: 2010-10-01 | Discharge: 2010-10-02 | Disposition: A | Discharge status: Home / Self Care | Payer: Medicaid Other | Attending: Emergency Medicine | Admitting: Emergency Medicine

## 2010-10-01 ENCOUNTER — Emergency Department (HOSPITAL_COMMUNITY): Payer: Medicaid Other

## 2010-10-01 DIAGNOSIS — G40802 Other epilepsy, not intractable, without status epilepticus: Secondary | ICD-10-CM | POA: Insufficient documentation

## 2010-10-01 DIAGNOSIS — R112 Nausea with vomiting, unspecified: Secondary | ICD-10-CM | POA: Insufficient documentation

## 2010-10-01 DIAGNOSIS — Z79899 Other long term (current) drug therapy: Secondary | ICD-10-CM | POA: Insufficient documentation

## 2010-10-01 DIAGNOSIS — K7460 Unspecified cirrhosis of liver: Secondary | ICD-10-CM | POA: Insufficient documentation

## 2010-10-01 DIAGNOSIS — Z8547 Personal history of malignant neoplasm of testis: Secondary | ICD-10-CM | POA: Insufficient documentation

## 2010-10-01 LAB — COMPREHENSIVE METABOLIC PANEL
ALT: 37 U/L (ref 0–53)
Calcium: 9.8 mg/dL (ref 8.4–10.5)
Glucose, Bld: 86 mg/dL (ref 70–99)
Sodium: 128 mEq/L — ABNORMAL LOW (ref 135–145)
Total Protein: 8.3 g/dL (ref 6.0–8.3)

## 2010-10-01 LAB — URINALYSIS, ROUTINE W REFLEX MICROSCOPIC
Nitrite: NEGATIVE
Specific Gravity, Urine: 1.017 (ref 1.005–1.030)
Urobilinogen, UA: 1 mg/dL (ref 0.0–1.0)

## 2010-10-01 LAB — LIPASE, BLOOD: Lipase: 56 U/L (ref 11–59)

## 2010-10-01 LAB — ETHANOL: Alcohol, Ethyl (B): <11 mg/dL — ABNORMAL HIGH (ref 0–10)

## 2010-10-02 ENCOUNTER — Encounter (HOSPITAL_COMMUNITY): Payer: Self-pay

## 2010-10-02 LAB — DIFFERENTIAL
Basophils Relative: 0 % (ref 0–1)
Eosinophils Relative: 2 % (ref 0–5)
Lymphs Abs: 0.8 10*3/uL (ref 0.7–4.0)
Monocytes Absolute: 0.7 10*3/uL (ref 0.1–1.0)

## 2010-10-02 LAB — CBC
MCH: 33.5 pg (ref 26.0–34.0)
MCV: 92.1 fL (ref 78.0–100.0)
Platelets: 117 10*3/uL — ABNORMAL LOW (ref 150–400)
RDW: 13.8 % (ref 11.5–15.5)

## 2010-10-02 MED ORDER — IOHEXOL 300 MG/ML  SOLN
100.0000 mL | Freq: Once | INTRAMUSCULAR | Status: AC | PRN
  Administered 2010-10-02: 100 mL via INTRAVENOUS

## 2010-10-28 ENCOUNTER — Ambulatory Visit: Payer: Self-pay | Admitting: Internal Medicine

## 2010-11-27 ENCOUNTER — Ambulatory Visit: Payer: Self-pay | Admitting: Internal Medicine

## 2011-01-28 LAB — URINALYSIS, ROUTINE W REFLEX MICROSCOPIC
Glucose, UA: NEGATIVE
Nitrite: NEGATIVE
Specific Gravity, Urine: 1.009
pH: 5.5

## 2011-01-28 LAB — HEPATIC FUNCTION PANEL
Albumin: 3.8
Alkaline Phosphatase: 111
Total Protein: 7.5

## 2011-01-28 LAB — CBC
MCV: 92.9
Platelets: 138 — ABNORMAL LOW
RDW: 13.2
WBC: 14.8 — ABNORMAL HIGH

## 2011-01-28 LAB — BASIC METABOLIC PANEL
BUN: 8
Chloride: 98
Creatinine, Ser: 0.75
GFR calc non Af Amer: >60

## 2011-01-28 LAB — DIFFERENTIAL
Basophils Absolute: 0.1
Basophils Relative: 1
Eosinophils Absolute: 0.1
Lymphs Abs: 1.5
Neutrophils Relative %: 82 — ABNORMAL HIGH

## 2011-01-28 LAB — LIPASE, BLOOD: Lipase: 26

## 2011-01-28 LAB — TYPE AND SCREEN
ABO/RH(D): O POS
Antibody Screen: NEGATIVE

## 2011-01-31 ENCOUNTER — Other Ambulatory Visit (HOSPITAL_COMMUNITY): Payer: Medicaid Other

## 2011-02-07 ENCOUNTER — Ambulatory Visit: Payer: Medicaid Other | Admitting: Radiation Oncology

## 2011-06-05 ENCOUNTER — Encounter (HOSPITAL_COMMUNITY): Payer: Self-pay | Admitting: Emergency Medicine

## 2011-06-05 ENCOUNTER — Emergency Department (HOSPITAL_COMMUNITY)
Admission: EM | Admit: 2011-06-05 | Discharge: 2011-06-05 | Discharge status: Against Medical Advice | Payer: Medicaid Other | Attending: Emergency Medicine | Admitting: Emergency Medicine

## 2011-06-05 DIAGNOSIS — Z79899 Other long term (current) drug therapy: Secondary | ICD-10-CM | POA: Insufficient documentation

## 2011-06-05 DIAGNOSIS — F1010 Alcohol abuse, uncomplicated: Secondary | ICD-10-CM | POA: Insufficient documentation

## 2011-06-05 DIAGNOSIS — F17200 Nicotine dependence, unspecified, uncomplicated: Secondary | ICD-10-CM | POA: Insufficient documentation

## 2011-06-05 LAB — RAPID URINE DRUG SCREEN, HOSP PERFORMED
Amphetamines: NOT DETECTED
Cocaine: NOT DETECTED
Opiates: NOT DETECTED
Tetrahydrocannabinol: NOT DETECTED

## 2011-06-05 LAB — CBC
HCT: 43.6 % (ref 39.0–52.0)
MCH: 34.7 pg — ABNORMAL HIGH (ref 26.0–34.0)
MCV: 92.8 fL (ref 78.0–100.0)
Platelets: 187 10*3/uL (ref 150–400)
RDW: 14.2 % (ref 11.5–15.5)

## 2011-06-05 LAB — COMPREHENSIVE METABOLIC PANEL
AST: 25 U/L (ref 0–37)
Albumin: 4 g/dL (ref 3.5–5.2)
BUN: 5 mg/dL — ABNORMAL LOW (ref 6–23)
Calcium: 9.4 mg/dL (ref 8.4–10.5)
Creatinine, Ser: 0.8 mg/dL (ref 0.50–1.35)
GFR calc non Af Amer: >90 mL/min (ref >90)
Total Bilirubin: 0.5 mg/dL (ref 0.3–1.2)

## 2011-06-05 LAB — ETHANOL: Alcohol, Ethyl (B): 263 mg/dL — ABNORMAL HIGH (ref 0–11)

## 2011-06-05 NOTE — ED Notes (Signed)
Pt alert, nad, presents with family, c/o detox from alcohol, resp even unlabored, skin pwd, last drink was today around 2030

## 2011-06-05 NOTE — ED Notes (Signed)
Lab bedside to obtain samples 

## 2011-06-05 NOTE — ED Notes (Signed)
Security bedside to wand pt, search belongings 

## 2011-06-05 NOTE — ED Notes (Signed)
Urine collected and sent down to lab for keeping. No order for testing at this time. RN notified 

## 2011-09-04 ENCOUNTER — Encounter (HOSPITAL_COMMUNITY): Payer: Self-pay | Admitting: Emergency Medicine

## 2011-09-04 ENCOUNTER — Emergency Department (HOSPITAL_COMMUNITY)
Admission: EM | Admit: 2011-09-04 | Discharge: 2011-09-05 | Disposition: A | Discharge status: Home / Self Care | Payer: Medicaid Other | Attending: Emergency Medicine | Admitting: Emergency Medicine

## 2011-09-04 ENCOUNTER — Emergency Department (HOSPITAL_COMMUNITY): Payer: Medicaid Other

## 2011-09-04 DIAGNOSIS — K439 Ventral hernia without obstruction or gangrene: Secondary | ICD-10-CM | POA: Insufficient documentation

## 2011-09-04 DIAGNOSIS — K5900 Constipation, unspecified: Secondary | ICD-10-CM | POA: Insufficient documentation

## 2011-09-04 DIAGNOSIS — F1010 Alcohol abuse, uncomplicated: Secondary | ICD-10-CM | POA: Insufficient documentation

## 2011-09-04 DIAGNOSIS — F10929 Alcohol use, unspecified with intoxication, unspecified: Secondary | ICD-10-CM

## 2011-09-04 DIAGNOSIS — K644 Residual hemorrhoidal skin tags: Secondary | ICD-10-CM | POA: Insufficient documentation

## 2011-09-04 DIAGNOSIS — R0602 Shortness of breath: Secondary | ICD-10-CM | POA: Insufficient documentation

## 2011-09-04 HISTORY — DX: Malignant neoplasm of prostate: C61

## 2011-09-04 HISTORY — DX: Unspecified convulsions: R56.9

## 2011-09-04 LAB — COMPREHENSIVE METABOLIC PANEL
Albumin: 4.2 g/dL (ref 3.5–5.2)
BUN: 7 mg/dL (ref 6–23)
Calcium: 8.8 mg/dL (ref 8.4–10.5)
Creatinine, Ser: 0.75 mg/dL (ref 0.50–1.35)
GFR calc Af Amer: >90 mL/min (ref >90)
Glucose, Bld: 91 mg/dL (ref 70–99)
Total Protein: 8 g/dL (ref 6.0–8.3)

## 2011-09-04 LAB — POCT I-STAT, CHEM 8
BUN: 5 mg/dL — ABNORMAL LOW (ref 6–23)
Chloride: 99 mEq/L (ref 96–112)
Creatinine, Ser: 1.2 mg/dL (ref 0.50–1.35)
Sodium: 136 mEq/L (ref 135–145)
TCO2: 26 mmol/L (ref 0–100)

## 2011-09-04 LAB — URINALYSIS, ROUTINE W REFLEX MICROSCOPIC
Glucose, UA: NEGATIVE mg/dL
Leukocytes, UA: NEGATIVE
Nitrite: NEGATIVE
pH: 6 (ref 5.0–8.0)

## 2011-09-04 LAB — CBC
HCT: 42.8 % (ref 39.0–52.0)
Hemoglobin: 15.7 g/dL (ref 13.0–17.0)
MCH: 34.2 pg — ABNORMAL HIGH (ref 26.0–34.0)
MCHC: 36.7 g/dL — ABNORMAL HIGH (ref 30.0–36.0)
MCV: 93.2 fL (ref 78.0–100.0)

## 2011-09-04 LAB — DIFFERENTIAL
Basophils Relative: 1 % (ref 0–1)
Eosinophils Absolute: 0.1 10*3/uL (ref 0.0–0.7)
Eosinophils Relative: 2 % (ref 0–5)
Monocytes Absolute: 0.4 10*3/uL (ref 0.1–1.0)
Monocytes Relative: 8 % (ref 3–12)
Neutrophils Relative %: 56 % (ref 43–77)

## 2011-09-04 LAB — OCCULT BLOOD, POC DEVICE: Fecal Occult Bld: NEGATIVE

## 2011-09-04 LAB — LIPASE, BLOOD: Lipase: 50 U/L (ref 11–59)

## 2011-09-04 LAB — TYPE AND SCREEN: ABO/RH(D): O POS

## 2011-09-04 LAB — AMMONIA: Ammonia: 24 umol/L (ref 11–60)

## 2011-09-04 MED ORDER — CHLORDIAZEPOXIDE HCL 10 MG PO CAPS: 10.0000 mg | ORAL_CAPSULE | Freq: Three times a day (TID) | ORAL | Status: AC | PRN

## 2011-09-04 MED ORDER — SODIUM CHLORIDE 0.9 % IV SOLN: 1000.0000 mL | INTRAVENOUS | Status: DC

## 2011-09-04 MED ORDER — LACTULOSE 10 GM/15ML PO SOLN: 20.0000 g | Freq: Two times a day (BID) | ORAL | Status: AC

## 2011-09-04 MED ORDER — SODIUM CHLORIDE 0.9 % IV SOLN
1000.0000 mL | Freq: Once | INTRAVENOUS | Status: AC
  Administered 2011-09-04 (×2): 1000 mL via INTRAVENOUS

## 2011-09-04 MED ORDER — ONDANSETRON HCL 4 MG/2ML IJ SOLN
4.0000 mg | Freq: Once | INTRAMUSCULAR | Status: AC
  Administered 2011-09-04: 4 mg via INTRAVENOUS
  Filled 2011-09-04: qty 2

## 2011-09-04 MED ORDER — HYDROMORPHONE HCL PF 1 MG/ML IJ SOLN
1.0000 mg | Freq: Once | INTRAMUSCULAR | Status: AC
  Administered 2011-09-04: 1 mg via INTRAVENOUS
  Filled 2011-09-04: qty 1

## 2011-09-04 NOTE — ED Provider Notes (Addendum)
History     CSN: [id number]  Arrival date & time 09/04/11  2026   First MD Initiated Contact with Patient 09/04/11 2120      Chief Complaint  Patient presents with  . Constipation  . possible seizure episode      HPI Patient presents emergent complaints of abdominal pain and constipation. Patient states he has a history of cirrhosis and alcoholism. Patient states for the last 7 days he has not been able to have a bowel movement. He spoke with his doctor and tried medications at home without relief. Patient has not had any nausea or vomiting. He did start to drink alcohol this morning. Patient does feel like he has pain in the rectal area as well. Has not noticed any diarrhea or loose stools. He has not had any dysuria. Nothing seems to be making this abdominal pain better which is diffuse in his abdomen. It is described as a cramping pain Past Medical History  Diagnosis Date  . 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 Cirrhosis of liver   . Prostate ca   . Seizure     Past Surgical History  Procedure Date  . Prostate surgery     History reviewed. No pertinent family history.  History  Substance Use Topics  . Smoking status: Current Everyday Smoker -- 1.0 packs/day    Types: Cigarettes  . Smokeless tobacco: Not on file  . Alcohol Use: Yes      Review of Systems  All other systems reviewed and are negative.    Allergies  Review of patient's allergies indicates no known allergies.  Home Medications   Current Outpatient Rx  Name Route Sig Dispense Refill  . ALPRAZOLAM 1 MG PO TABS Oral Take 1 mg by mouth 3 (three) times daily as needed. Anxiety    . ESCITALOPRAM OXALATE 20 MG PO TABS Oral Take 20 mg by mouth daily.    . FUROSEMIDE 80 MG PO TABS Oral Take 40 mg by mouth 2 (two) times daily.    Marland Kitchen LEVOTHYROXINE SODIUM 100 MCG PO TABS Oral Take 100 mcg by mouth daily.    Marland Kitchen PHENYTOIN SODIUM EXTENDED 100 MG PO CAPS Oral Take 200 mg by mouth 2 (two) times daily.    Marland Kitchen  SPIRONOLACTONE 25 MG PO TABS Oral Take 25 mg by mouth daily.    Marland Kitchen ZOLPIDEM TARTRATE 10 MG PO TABS Oral Take 10 mg by mouth at bedtime as needed.      BP 113/73  Pulse 76  Temp(Src) 98.7 F (37.1 C) (Oral)  Resp 18  Wt 212 lb (96.163 kg)  SpO2 97%  Physical Exam  Nursing note and vitals reviewed. Constitutional: No distress.  HENT:  Head: Normocephalic and atraumatic.  Right Ear: External ear normal.  Left Ear: External ear normal.  Eyes: Conjunctivae are normal. Right eye exhibits no discharge. Left eye exhibits no discharge. No scleral icterus.  Neck: Neck supple. No tracheal deviation present.  Cardiovascular: Normal rate, regular rhythm and intact distal pulses.   Pulmonary/Chest: Effort normal and breath sounds normal. No stridor. No respiratory distress. He has no wheezes. He has no rales.  Abdominal: Soft. Bowel sounds are normal. He exhibits no distension and no mass. There is tenderness. There is no rebound and no guarding.       Diffusely, reducible ventral hernia  Genitourinary:       External hemorrhoids noted, stool noted in rectal vault  Musculoskeletal: He exhibits no edema and  no tenderness.  Neurological: He is alert. He has normal strength. No sensory deficit. Cranial nerve deficit:  no gross defecits noted. He exhibits normal muscle tone. He displays no seizure activity. Coordination normal.  Skin: Skin is warm and dry. No rash noted.  Psychiatric: He has a normal mood and affect.    ED Course  Procedures (including critical care time)  Labs Reviewed  CBC - Abnormal; Notable for the following:    MCH 34.2 (*)    MCHC 36.7 (*)    All other components within normal limits  COMPREHENSIVE METABOLIC PANEL - Abnormal; Notable for the following:    Sodium 134 (*)    Chloride 94 (*)    All other components within normal limits  ETHANOL - Abnormal; Notable for the following:    Alcohol, Ethyl (B) 353 (*)    All other components within normal limits  POCT  I-STAT, CHEM 8 - Abnormal; Notable for the following:    BUN 5 (*)    Calcium, Ion 0.99 (*)    All other components within normal limits  DIFFERENTIAL  URINALYSIS, ROUTINE W REFLEX MICROSCOPIC  LIPASE, BLOOD  AMMONIA  TYPE AND SCREEN  OCCULT BLOOD, POC DEVICE   Dg Abd Acute W/chest  09/04/2011  *RADIOLOGY REPORT*  Clinical Data: Shortness of breath with nausea constipation.  ACUTE ABDOMEN SERIES (ABDOMEN 2 VIEW & CHEST 1 VIEW)  Comparison: Chest x-ray from 07/04/2010.  Findings: The lungs are clear without focal consolidation, edema, effusion or pneumothorax.  Cardiopericardial silhouette is within normal limits for size.  Imaged bony structures of the thorax are intact.  Upright film shows no evidence for intraperitoneal free air. Supine film shows no gaseous bowel dilatation to suggest obstruction.  No unexpected abdominopelvic calcification.  The visualized bony structures are unremarkable.  IMPRESSION: No acute cardiopulmonary findings.  No evidence for bowel obstruction or perforation.  Original Report Authenticated By: ERIC A. MANSELL, M.D.     1. Alcohol intoxication   2. Constipation       MDM  The patient is without signs of hepatic encephalopathy. The patient questioned why he is feeling somewhat confused and I explained to him that his alcohol level of 353  would cause him to have some trouble with his concentration.  I doubt acute infection or obstruction associated with his abdominal pain.  He will be given a prescription for lactulose.  I explained to the patient that he should stop drinking alcohol.  He was given referrals for outpatient alcohol treatment centers.  We will make sure someone is able to take the patient home.  Celene Kras, MD 09/04/11 [phone]

## 2011-09-04 NOTE — ED Notes (Signed)
Per EMS: pt called EMS stating he has constipation and did not have BM for seven days now, pt also reports Hx of seizures, prescribed Dilantin, however, states did not take one for few days now, pt also with hx of  Liver cirrhosis, upon assessment smell ETOH on the pt

## 2011-09-04 NOTE — ED Notes (Signed)
NGE:[id number]<UX> Expected date:09/04/11<BR> Expected time:<BR> Means of arrival:<BR> Comments:<BR> EMS 261 GC - constipation

## 2011-09-04 NOTE — Discharge Instructions (Signed)
Alcohol Intoxication Alcohol intoxication means your blood alcohol level is above legal limits. Alcohol is a drug. It has serious side effects. These side effects can include:  Damage to your organs (liver, nervous system, and blood system).   Unclear thinking.   Slowed reflexes.   Decreased muscle coordination.  HOME CARE  Do not drink and drive.   Do not drink alcohol if you are taking medicine or using other drugs. Doing so can cause serious medical problems or even death.   Drink enough water and fluids to keep your pee (urine) clear or pale yellow.   Eat healthy foods.   Only take medicine as told by your doctor.   Join an alcohol support group.  GET HELP RIGHT AWAY IF:  You become shaky when you stop drinking.   Your thinking is unclear or you become confused.   You throw up (vomit) blood. It may look bright red or like coffee grounds.   You notice blood in your poop (bowel movements).   You become lightheaded or pass out (faint).  MAKE SURE YOU:   Understand these instructions.   Will watch your condition.   Will get help right away if you are not doing well or get worse.  Document Released: 10/01/2007 Document Revised: 04/03/2011 Document Reviewed: 10/01/2009 [facility] Patient Information 2012 [place], [place].   RESOURCE GUIDE  Dental Problems  Patients with Medicaid: [facility]                     [phone]                                           Phone:  [phone]                                                  If unable to pay or uninsured, contact:  [facility] or [facility]. to become qualified for the adult dental clinic.  Chronic Pain Problems Contact [facility]  [phone] Patients need to be referred by their primary care doctor.  Insufficient Money for Medicine Contact [employer]:  call "211" or [facility] [phone].  No Primary Care Doctor Call [facility]   [phone] Other agencies that provide inexpensive medical care    [facility]  [phone]    [facility]  [phone]    [facility]  [phone]    [facility]  [phone]    [facility]  6041699835    [facility]  4037318817  Substance Abuse Resources [facility]  ([phone] [facility] 8045878816 [facility] [place] [phone] Floydene Flock 2033215130 Residential & Outpatient Substance Abuse Program  ([phone]  Psychological Services [facility] Behavioral Health  [phone] [facility]  [phone] [facility]   [phone] (emergency services ([phone])  Abuse/Neglect [facility] Child Abuse Hotline 669-702-9031 [facility] Child Abuse Hotline 506-596-6367 (After Hours)  Emergency Shelter [facility] 6230153605  Maternity Homes [facility] at the [place] [facility] [phone])[phone] [facility] ([phone]  MRSA Hotline #:   (858)351-8088    [facility]  [employer]  [employer]                           [facility] Dept. [address] [place]                     [address]         [address]  Blondell Reveal Phone:  [phone]                                  Phone:  [phone]                   Phone:  [phone]  [facility] Mental Health Phone:  [phone]  [facility] Child Abuse Hotline 409-288-1413 [phone] (After Hours)

## 2011-09-04 NOTE — ED Notes (Signed)
Patient transported to X-ray 

## 2011-09-05 ENCOUNTER — Emergency Department (HOSPITAL_COMMUNITY)
Admission: EM | Admit: 2011-09-05 | Discharge: 2011-09-05 | Disposition: A | Discharge status: Home / Self Care | Payer: Medicaid Other | Attending: Emergency Medicine | Admitting: Emergency Medicine

## 2011-09-05 ENCOUNTER — Encounter (HOSPITAL_COMMUNITY): Payer: Self-pay

## 2011-09-05 DIAGNOSIS — R259 Unspecified abnormal involuntary movements: Secondary | ICD-10-CM | POA: Insufficient documentation

## 2011-09-05 DIAGNOSIS — G40909 Epilepsy, unspecified, not intractable, without status epilepticus: Secondary | ICD-10-CM | POA: Insufficient documentation

## 2011-09-05 DIAGNOSIS — K5900 Constipation, unspecified: Secondary | ICD-10-CM | POA: Insufficient documentation

## 2011-09-05 DIAGNOSIS — K7460 Unspecified cirrhosis of liver: Secondary | ICD-10-CM | POA: Insufficient documentation

## 2011-09-05 DIAGNOSIS — Z8547 Personal history of malignant neoplasm of testis: Secondary | ICD-10-CM | POA: Insufficient documentation

## 2011-09-05 DIAGNOSIS — F1010 Alcohol abuse, uncomplicated: Secondary | ICD-10-CM

## 2011-09-05 DIAGNOSIS — Z79899 Other long term (current) drug therapy: Secondary | ICD-10-CM | POA: Insufficient documentation

## 2011-09-05 MED ORDER — LORAZEPAM 1 MG PO TABS
1.0000 mg | ORAL_TABLET | Freq: Once | ORAL | Status: AC
  Administered 2011-09-05: 1 mg via ORAL
  Filled 2011-09-05: qty 1

## 2011-09-05 NOTE — ED Notes (Signed)
Pt is now asleep

## 2011-09-05 NOTE — Discharge Instructions (Signed)
Take your medication as prescribed, take librium as need - no driving for the next 8 hours or when taking this medication. Follow up with primary care doctor in the next 1-2 days.  For alcohol problem, follow up with AA, and use resource guide provided. For constipation, drink plenty of fluids, get adequate fiber in diet, try colace and miralax as need (available over the counter), and follow up with primary care doctor.  Return to ER if worse, new symptoms, severe abdominal pain, seizures, other concern.      RESOURCE GUIDE  Dental Problems  Patients with Medicaid: [facility] [phone] [address]                                           [phone] [address] Phone:  [phone]                                                  Phone:  [phone]  If unable to pay or uninsured, contact:  [facility] or [facility] to become qualified for the adult dental clinic.  Chronic Pain Problems Contact [facility]  [phone] Patients need to be referred by their primary care doctor.  Insufficient Money for Medicine Contact [employer] Way:  call "211" or [facility] [phone].  No Primary Care Doctor Call [facility]  [phone] Other agencies that provide inexpensive medical care    [facility]  [phone]    [facility]  [phone]    [facility]  [phone]    [facility]  [phone]    [facility]  [phone]    [facility]  [phone]  Psychological Services [facility]  [phone] [facility] Services  [phone] [facility] Mental [facility]   [phone] (emergency services [phone])  Substance Abuse Resources [facility]  [phone] [facility] [phone] [facility] [phone] Floydene Flock [phone] Residential & Outpatient Substance Abuse Program   [phone]  Abuse/Neglect [facility] Child Abuse Hotline [phone] [facility] Child Abuse Hotline [phone] (After Hours)  Emergency Shelter [facility] [phone]  Maternity Homes [facility] at [facility] [place] [facility] [phone] [facility] Services [phone]  MRSA Hotline #:   6501901187    [facility] Resources  [facility]     United [employer]                          [facility] Dept. [address] [place]                       [address]      [address]  Patrecia Pace  Michell Heinrich Phone:  [phone]                                   Phone:  [phone]                 Phone:  [phone]  [facility] Mental [facility] Phone:  [phone]  [facility] Child Abuse Hotline [phone] (After Hours)       Alcohol Problems Most adults who drink alcohol drink in moderation (not a lot) are at low risk for developing problems related to their drinking. However, all drinkers, including low-risk drinkers, should know about the health risks connected with drinking alcohol. RECOMMENDATIONS FOR LOW-RISK DRINKING  Drink in moderation. Moderate drinking is defined as follows:   Men - no more than 2 drinks per day.   Nonpregnant women - no more than 1 drink per day.   Over age 43 - no more than 1 drink per day.  A standard drink is 12 grams of pure alcohol, which is equal to a 12 ounce bottle of beer or wine cooler, a 5 ounce glass of wine, or 1.5 ounces of distilled spirits (such as whiskey, brandy, vodka, or rum).  ABSTAIN FROM (DO NOT DRINK) ALCOHOL:  When pregnant or considering pregnancy.   When taking a medication that interacts with alcohol.   If you are alcohol dependent.   A medical condition that prohibits drinking alcohol (such as ulcer, liver disease, or heart disease).   DISCUSS WITH YOUR CAREGIVER:  If you are at risk for coronary heart disease, discuss the potential benefits and risks of alcohol use: Light to moderate drinking is associated with lower rates of coronary heart disease in certain populations (for example, men over age 66 and postmenopausal women). Infrequent or nondrinkers are advised not to begin light to moderate drinking to reduce the risk of coronary heart disease so as to avoid creating an alcohol-related problem. Similar protective effects can likely be gained through proper diet and exercise.   Women and the elderly have smaller amounts of body water than men. As a result women and the elderly achieve a higher blood alcohol concentration after drinking the same amount of alcohol.   Exposing a fetus to alcohol can cause a broad range of birth defects referred to as Fetal Alcohol Syndrome (FAS) or Alcohol-Related Birth Defects (ARBD). Although FAS/ARBD is connected with excessive alcohol consumption during pregnancy, studies also have reported neurobehavioral problems in infants born to mothers reporting drinking an average of 1 drink per day during pregnancy.   Heavier drinking (the consumption of more than 4 drinks per occasion by men and more than 3 drinks per occasion by women) impairs learning (cognitive) and psychomotor functions and increases the risk of alcohol-related problems, including accidents and injuries.  CAGE QUESTIONS:   Have you ever felt that you should Cut down on your drinking?   Have people Annoyed you by criticizing your drinking?   Have you ever felt bad or Guilty about your drinking?   Have you ever had a drink first thing in the morning to steady your nerves or get rid of a hangover (Eye opener)?  If you answered positively to any of these questions: You may be at risk for alcohol-related problems if alcohol consumption is:   Men: Greater than 14 drinks per week or more than 4 drinks per occasion.   Women:  Greater than 7 drinks per  week or more than 3 drinks per occasion.  Do you or your family have a medical history of alcohol-related problems, such as:  Blackouts.   Sexual dysfunction.   Depression.   Trauma.   Liver dysfunction.   Sleep disorders.   Hypertension.   Chronic abdominal pain.   Has your drinking ever caused you problems, such as problems with your family, problems with your work (or school) performance, or accidents/injuries?   Do you have a compulsion to drink or a preoccupation with drinking?   Do you have poor control or are you unable to stop drinking once you have started?   Do you have to drink to avoid withdrawal symptoms?   Do you have problems with withdrawal such as tremors, nausea, sweats, or mood disturbances?   Does it take more alcohol than in the past to get you high?   Do you feel a strong urge to drink?   Do you change your plans so that you can have a drink?   Do you ever drink in the morning to relieve the shakes or a hangover?  If you have answered a number of the previous questions positively, it may be time for you to talk to your caregivers, family, and friends and see if they think you have a problem. Alcoholism is a chemical dependency that keeps getting worse and will eventually destroy your health and relationships. Many alcoholics end up dead, impoverished, or in prison. This is often the end result of all chemical dependency.  Do not be discouraged if you are not ready to take action immediately.   Decisions to change behavior often involve up and down desires to change and feeling like you cannot decide.   Try to think more seriously about your drinking behavior.   Think of the reasons to quit.  WHERE TO GO FOR ADDITIONAL INFORMATION   The National Institute on Alcohol Abuse and Alcoholism (NIAAA)www.niaaa.nih.gov   [employer] on Alcoholism and Drug Dependence (NCADD)www.ncadd.org   American Society of Addiction  Medicine (ASAM)www.https://anderson-johnson.com/  Document Released: 04/14/2005 Document Revised: 04/03/2011 Document Reviewed: 12/01/2007 [facility] Patient Information 2012 [place], [place].     Alcohol Withdrawal Anytime drug use is interfering with normal living activities it has become abuse. This includes problems with family and friends. Psychological dependence has developed when your mind tells you that the drug is needed. This is usually followed by physical dependence when a continuing increase of drugs are required to get the same feeling or "high." This is known as addiction or chemical dependency. A person's risk is much higher if there is a history of chemical dependency in the family. Mild Withdrawal Following Stopping Alcohol, When Addiction or Chemical Dependency Has Developed When a person has developed tolerance to alcohol, any sudden stopping of alcohol can cause uncomfortable physical symptoms. Most of the time these are mild and consist of tremors in the hands and increases in heart rate, breathing, and temperature. Sometimes these symptoms are associated with anxiety, panic attacks, and bad dreams. There may also be stomach upset. Normal sleep patterns are often interrupted with periods of inability to sleep (insomnia). This may last for 6 months. Because of this discomfort, many people choose to continue drinking to get rid of this discomfort and to try to feel normal. Severe Withdrawal with Decreased or No Alcohol Intake, When Addiction or Chemical Dependency Has Developed About five percent of alcoholics will develop signs of severe withdrawal when they stop using alcohol. One sign of this is  development of generalized seizures (convulsions). Other signs of this are severe agitation and confusion. This may be associated with believing in things which are not real or seeing things which are not really there (delusions and hallucinations). Vitamin deficiencies are usually present if alcohol intake has  been long-term. Treatment for this most often requires hospitalization and close observation. Addiction can only be helped by stopping use of all chemicals. This is hard but may save your life. With continual alcohol use, possible outcomes are usually loss of self respect and esteem, violence, and death. Addiction cannot be cured but it can be stopped. This often requires outside help and the care of professionals. Treatment centers are listed in the yellow pages under Cocaine, Narcotics, and Alcoholics Anonymous. Most hospitals and clinics can refer you to a specialized care center. It is not necessary for you to go through the uncomfortable symptoms of withdrawal. Your caregiver can provide you with medicines that will help you through this difficult period. Try to avoid situations, friends, or drugs that made it possible for you to keep using alcohol in the past. Learn how to say no. It takes a long period of time to overcome addictions to all drugs, including alcohol. There may be many times when you feel as though you want a drink. After getting rid of the physical addiction and withdrawal, you will have a lessening of the craving which tells you that you need alcohol to feel normal. Call your caregiver if more support is needed. Learn who to talk to in your family and among your friends so that during these periods you can receive outside help. Alcoholics Anonymous (AA) has helped many people over the years. To get further help, contact AA or call your caregiver, counselor, or clergyperson. Al-Anon and Alateen are support groups for friends and family members of an alcoholic. The people who love and care for an alcoholic often need help, too. For information about these organizations, check your phone directory or call a local alcoholism treatment center.  SEEK IMMEDIATE MEDICAL CARE IF:   You have a seizure.   You have a fever.   You experience uncontrolled vomiting or you vomit up blood. This may  be bright red or look like black coffee grounds.   You have blood in the stool. This may be bright red or appear as a black, tarry, bad-smelling stool.   You become lightheaded or faint. Do not drive if you feel this way. Have someone else drive you or call 213 for help.   You become more agitated or confused.   You develop uncontrolled anxiety.   You begin to see things that are not really there (hallucinate).  Your caregiver has determined that you completely understand your medical condition, and that your mental state is back to normal. You understand that you have been treated for alcohol withdrawal, have agreed not to drink any alcohol for a minimum of 1 day, will not operate a car or other machinery for 24 hours, and have had an opportunity to ask any questions about your condition. Document Released: 01/22/2005 Document Revised: 04/03/2011 Document Reviewed: 12/01/2007 [facility] Patient Information 2012 [place], [place].      Constipation in Adults Constipation is having fewer than 2 bowel movements per week. Usually, the stools are hard. As we grow older, constipation is more common. If you try to fix constipation with laxatives, the problem may get worse. This is because laxatives taken over a long period of time make the colon  muscles weaker. A low-fiber diet, not taking in enough fluids, and taking some medicines may make these problems worse. MEDICATIONS THAT MAY CAUSE CONSTIPATION  Water pills (diuretics).   Calcium channel blockers (used to control blood pressure and for the heart).   Certain pain medicines (narcotics).   Anticholinergics.   Anti-inflammatory agents.   Antacids that contain aluminum.  DISEASES THAT CONTRIBUTE TO CONSTIPATION  Diabetes.   Parkinson's disease.   Dementia.   Stroke.   Depression.   Illnesses that cause problems with salt and water metabolism.  HOME CARE INSTRUCTIONS   Constipation is usually best cared for without medicines.  Increasing dietary fiber and eating more fruits and vegetables is the best way to manage constipation.   Slowly increase fiber intake to 25 to 38 grams per day. Whole grains, fruits, vegetables, and legumes are good sources of fiber. A dietitian can further help you incorporate high-fiber foods into your diet.   Drink enough water and fluids to keep your urine clear or pale yellow.   A fiber supplement may be added to your diet if you cannot get enough fiber from foods.   Increasing your activities also helps improve regularity.   Suppositories, as suggested by your caregiver, will also help. If you are using antacids, such as aluminum or calcium containing products, it will be helpful to switch to products containing magnesium if your caregiver says it is okay.   If you have been given a liquid injection (enema) today, this is only a temporary measure. It should not be relied on for treatment of longstanding (chronic) constipation.   Stronger measures, such as magnesium sulfate, should be avoided if possible. This may cause uncontrollable diarrhea. Using magnesium sulfate may not allow you time to make it to the bathroom.  SEEK IMMEDIATE MEDICAL CARE IF:   There is bright red blood in the stool.   The constipation stays for more than 4 days.   There is belly (abdominal) or rectal pain.   You do not seem to be getting better.   You have any questions or concerns.  MAKE SURE YOU:   Understand these instructions.   Will watch your condition.   Will get help right away if you are not doing well or get worse.  Document Released: 01/11/2004 Document Revised: 04/03/2011 Document Reviewed: 03/18/2011 [facility] Patient Information 2012 [place], [place].

## 2011-09-05 NOTE — ED Notes (Signed)
Pt was here earlier tonight and didn't have a way home at discharge, he has been in the waiting room all night sleeping, he states that he still doesn't have a ride home yet and needs his seizure medications

## 2011-09-05 NOTE — ED Notes (Signed)
Pt states he wants something to help him stop shaking. Pt alert with no acute distress.

## 2011-09-05 NOTE — ED Provider Notes (Signed)
History     CSN: [id number]  Arrival date & time 09/05/11  2025   First MD Initiated Contact with Patient 09/05/11 [phone]      Chief Complaint  Patient presents with  . Medication Refill    (Consider location/radiation/quality/duration/timing/severity/associated sxs/prior treatment) The history is provided by the patient.  pt states was seen in ed last night w constipation, and that he couldn't find ride home last night, staying in waiting room all night. States now feels mildly shaky. States feels due to not having his meds while waiting in ed.  No seizure activity. Hx etoh abuse, etoh intoxication last pm. States not seeking rehab or detox, but feels shaky. No nausea or vomiting. Denies hx dts. No fever or chills.     Past Medical History  Diagnosis Date  . 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 Cirrhosis of liver   . Prostate ca   . Seizure     Past Surgical History  Procedure Date  . Prostate surgery     History reviewed. No pertinent family history.  History  Substance Use Topics  . Smoking status: Current Everyday Smoker -- 1.0 packs/day    Types: Cigarettes  . Smokeless tobacco: Not on file  . Alcohol Use: Yes      Review of Systems  Constitutional: Negative for fever and chills.  HENT: Negative for neck pain.   Eyes: Negative for visual disturbance.  Respiratory: Negative for cough and shortness of breath.   Cardiovascular: Negative for chest pain.  Gastrointestinal: Negative for vomiting and diarrhea.  Genitourinary: Negative for flank pain.  Musculoskeletal: Negative for back pain.  Skin: Negative for rash.  Neurological: Negative for weakness, numbness and headaches.  Hematological: Does not bruise/bleed easily.  Psychiatric/Behavioral: Negative for confusion.    Allergies  Review of patient's allergies indicates no known allergies.  Home Medications   Current Outpatient Rx  Name Route Sig Dispense Refill  . ALPRAZOLAM 1 MG PO TABS Oral  Take 1 mg by mouth 3 (three) times daily as needed. Anxiety    . ESCITALOPRAM OXALATE 20 MG PO TABS Oral Take 20 mg by mouth daily.    . FUROSEMIDE 80 MG PO TABS Oral Take 40 mg by mouth 2 (two) times daily.    Marland Kitchen LACTULOSE 10 GM/15ML PO SOLN Oral Take 30 mLs (20 g total) by mouth 2 (two) times daily. 240 mL 0  . LEVOTHYROXINE SODIUM 100 MCG PO TABS Oral Take 100 mcg by mouth daily.    Marland Kitchen PHENYTOIN SODIUM EXTENDED 100 MG PO CAPS Oral Take 200 mg by mouth 2 (two) times daily.    Marland Kitchen SPIRONOLACTONE 25 MG PO TABS Oral Take 25 mg by mouth daily.    Marland Kitchen ZOLPIDEM TARTRATE 10 MG PO TABS Oral Take 10 mg by mouth at bedtime as needed.    . CHLORDIAZEPOXIDE HCL 10 MG PO CAPS Oral Take 1 capsule (10 mg total) by mouth 3 (three) times daily as needed for anxiety. 6 capsule 0    BP 138/67  Pulse 83  Temp(Src) 98.7 F (37.1 C) (Oral)  Resp 20  SpO2 97%  Physical Exam  Nursing note and vitals reviewed. Constitutional: He is oriented to person, place, and time. He appears well-developed and well-nourished. No distress.  HENT:  Nose: Nose normal.  Eyes: Pupils are equal, round, and reactive to light.  Neck: Neck supple. No tracheal deviation present.  Cardiovascular: Normal rate, regular rhythm, normal heart sounds and intact distal  pulses.   Pulmonary/Chest: Effort normal and breath sounds normal. No accessory muscle usage. No respiratory distress.  Abdominal: Soft. He exhibits no distension. There is no tenderness.  Musculoskeletal: Normal range of motion.  Neurological: He is alert and oriented to person, place, and time.       Motor intact bil. Pt resting quietly. At times during exam pt notes feels shaky, no gross tremors or other involuntary movement noted.   Skin: Skin is warm and dry.  Psychiatric: He has a normal mood and affect.    ED Course  Procedures (including critical care time)   Results for orders placed during the hospital encounter of 09/04/11  CBC      Component Value Range    WBC 5.2  4.0 - 10.5 (K/uL)   RBC 4.59  4.22 - 5.81 (MIL/uL)   Hemoglobin 15.7  13.0 - 17.0 (g/dL)   HCT 16.1  09.6 - 04.5 (%)   MCV 93.2  78.0 - 100.0 (fL)   MCH 34.2 (*) 26.0 - 34.0 (pg)   MCHC 36.7 (*) 30.0 - 36.0 (g/dL)   RDW 40.9  81.1 - 91.4 (%)   Platelets 193  150 - 400 (K/uL)  DIFFERENTIAL      Component Value Range   Neutrophils Relative 56  43 - 77 (%)   Neutro Abs 2.9  1.7 - 7.7 (K/uL)   Lymphocytes Relative 33  12 - 46 (%)   Lymphs Abs 1.7  0.7 - 4.0 (K/uL)   Monocytes Relative 8  3 - 12 (%)   Monocytes Absolute 0.4  0.1 - 1.0 (K/uL)   Eosinophils Relative 2  0 - 5 (%)   Eosinophils Absolute 0.1  0.0 - 0.7 (K/uL)   Basophils Relative 1  0 - 1 (%)   Basophils Absolute 0.1  0.0 - 0.1 (K/uL)  COMPREHENSIVE METABOLIC PANEL      Component Value Range   Sodium 134 (*) 135 - 145 (mEq/L)   Potassium 3.8  3.5 - 5.1 (mEq/L)   Chloride 94 (*) 96 - 112 (mEq/L)   CO2 25  19 - 32 (mEq/L)   Glucose, Bld 91  70 - 99 (mg/dL)   BUN 7  6 - 23 (mg/dL)   Creatinine, Ser 7.82  0.50 - 1.35 (mg/dL)   Calcium 8.8  8.4 - 95.6 (mg/dL)   Total Protein 8.0  6.0 - 8.3 (g/dL)   Albumin 4.2  3.5 - 5.2 (g/dL)   AST 34  0 - 37 (U/L)   ALT 22  0 - 53 (U/L)   Alkaline Phosphatase 108  39 - 117 (U/L)   Total Bilirubin 0.3  0.3 - 1.2 (mg/dL)   GFR calc non Af Amer >90  >90 (mL/min)   GFR calc Af Amer >90  >90 (mL/min)  URINALYSIS, ROUTINE W REFLEX MICROSCOPIC      Component Value Range   Color, Urine YELLOW  YELLOW    APPearance CLEAR  CLEAR    Specific Gravity, Urine 1.012  1.005 - 1.030    pH 6.0  5.0 - 8.0    Glucose, UA NEGATIVE  NEGATIVE (mg/dL)   Hgb urine dipstick NEGATIVE  NEGATIVE    Bilirubin Urine NEGATIVE  NEGATIVE    Ketones, ur NEGATIVE  NEGATIVE (mg/dL)   Protein, ur NEGATIVE  NEGATIVE (mg/dL)   Urobilinogen, UA 0.2  0.0 - 1.0 (mg/dL)   Nitrite NEGATIVE  NEGATIVE    Leukocytes, UA NEGATIVE  NEGATIVE   LIPASE, BLOOD  Component Value Range   Lipase 50  11 - 59 (U/L)    ETHANOL      Component Value Range   Alcohol, Ethyl (B) 353 (*) 0 - 11 (mg/dL)  AMMONIA      Component Value Range   Ammonia 24  11 - 60 (umol/L)  TYPE AND SCREEN      Component Value Range   ABO/RH(D) O POS     Antibody Screen NEG     Sample Expiration 09/07/2011    OCCULT BLOOD, POC DEVICE      Component Value Range   Fecal Occult Bld NEGATIVE    POCT I-STAT, CHEM 8      Component Value Range   Sodium 136  135 - 145 (mEq/L)   Potassium 4.0  3.5 - 5.1 (mEq/L)   Chloride 99  96 - 112 (mEq/L)   BUN 5 (*) 6 - 23 (mg/dL)   Creatinine, Ser 1.61  0.50 - 1.35 (mg/dL)   Glucose, Bld 89  70 - 99 (mg/dL)   Calcium, Ion 0.96 (*) 1.12 - 1.32 (mmol/L)   TCO2 26  0 - 100 (mmol/L)   Hemoglobin 15.3  13.0 - 17.0 (g/dL)   HCT 04.5  40.9 - 81.1 (%)   Dg Abd Acute W/chest  09/04/2011  *RADIOLOGY REPORT*  Clinical Data: Shortness of breath with nausea constipation.  ACUTE ABDOMEN SERIES (ABDOMEN 2 VIEW & CHEST 1 VIEW)  Comparison: Chest x-ray from 07/04/2010.  Findings: The lungs are clear without focal consolidation, edema, effusion or pneumothorax.  Cardiopericardial silhouette is within normal limits for size.  Imaged bony structures of the thorax are intact.  Upright film shows no evidence for intraperitoneal free air. Supine film shows no gaseous bowel dilatation to suggest obstruction.  No unexpected abdominopelvic calcification.  The visualized bony structures are unremarkable.  IMPRESSION: No acute cardiopulmonary findings.  No evidence for bowel obstruction or perforation.  Original Report Authenticated By: ERIC A. MANSELL, M.D.      MDM  Reviewed labs/xrays from last pm.  abd soft nt. Pt slightly tremulous. Ativan 1 mg po. Discussed etoh rehab/detox w pt, pt declines. Pt states does have adequate of his meds at home, no recent seizures.   Recheck, pt resting quietly, no distress. No shakes or sz activity.      Suzi Roots, MD 09/05/11 [phone]

## 2011-11-12 LAB — CBC
HCT: 43.4 % (ref 40.0–52.0)
MCH: 34.3 pg — ABNORMAL HIGH (ref 26.0–34.0)
MCHC: 34.4 g/dL (ref 32.0–36.0)
MCV: 100 fL (ref 80–100)
Platelet: 99 10*3/uL — ABNORMAL LOW (ref 150–440)
RDW: 15.5 % — ABNORMAL HIGH (ref 11.5–14.5)

## 2011-11-12 LAB — URINALYSIS, COMPLETE
Bacteria: NONE SEEN
Glucose,UR: NEGATIVE mg/dL (ref 0–75)
Ketone: NEGATIVE
Leukocyte Esterase: NEGATIVE
Specific Gravity: 1.002 (ref 1.003–1.030)
Squamous Epithelial: NONE SEEN
WBC UR: NONE SEEN /HPF (ref 0–5)

## 2011-11-12 LAB — COMPREHENSIVE METABOLIC PANEL
Anion Gap: 12 (ref 7–16)
BUN: 3 mg/dL — ABNORMAL LOW (ref 7–18)
Calcium, Total: 9.2 mg/dL (ref 8.5–10.1)
Chloride: 95 mmol/L — ABNORMAL LOW (ref 98–107)
EGFR (African American): >60
EGFR (Non-African Amer.): >60
Glucose: 109 mg/dL — ABNORMAL HIGH (ref 65–99)
Osmolality: 258 (ref 275–301)
Potassium: 3.1 mmol/L — ABNORMAL LOW (ref 3.5–5.1)
SGOT(AST): 330 U/L — ABNORMAL HIGH (ref 15–37)
SGPT (ALT): 249 U/L — ABNORMAL HIGH

## 2011-11-12 LAB — DRUG SCREEN, URINE
Amphetamines, Ur Screen: NEGATIVE (ref ?–1000)
Benzodiazepine, Ur Scrn: NEGATIVE (ref ?–200)
Cannabinoid 50 Ng, Ur ~~LOC~~: NEGATIVE (ref ?–50)
Cocaine Metabolite,Ur ~~LOC~~: NEGATIVE (ref ?–300)
MDMA (Ecstasy)Ur Screen: NEGATIVE (ref ?–500)
Tricyclic, Ur Screen: NEGATIVE (ref ?–1000)

## 2011-11-12 LAB — SALICYLATE LEVEL: Salicylates, Serum: 5.4 mg/dL — ABNORMAL HIGH

## 2011-11-12 LAB — PHENYTOIN LEVEL, TOTAL: Dilantin: 2 ug/mL — ABNORMAL LOW (ref 10.0–20.0)

## 2011-11-13 ENCOUNTER — Inpatient Hospital Stay: Payer: Self-pay | Admitting: Psychiatry

## 2011-11-13 LAB — COMPREHENSIVE METABOLIC PANEL
Albumin: 3.4 g/dL (ref 3.4–5.0)
Alkaline Phosphatase: 188 U/L — ABNORMAL HIGH (ref 50–136)
Anion Gap: 14 (ref 7–16)
BUN: 3 mg/dL — ABNORMAL LOW (ref 7–18)
Bilirubin,Total: 0.7 mg/dL (ref 0.2–1.0)
Glucose: 104 mg/dL — ABNORMAL HIGH (ref 65–99)
Osmolality: 276 (ref 275–301)
Potassium: 3.4 mmol/L — ABNORMAL LOW (ref 3.5–5.1)
Sodium: 140 mmol/L (ref 136–145)
Total Protein: 7.4 g/dL (ref 6.4–8.2)

## 2011-11-13 LAB — AMMONIA: Ammonia, Plasma: 30 mcmol/L (ref 11–32)

## 2011-11-14 LAB — HEPATIC FUNCTION PANEL A (ARMC)
Alkaline Phosphatase: 173 U/L — ABNORMAL HIGH (ref 50–136)
SGOT(AST): 257 U/L — ABNORMAL HIGH (ref 15–37)
SGPT (ALT): 241 U/L — ABNORMAL HIGH

## 2011-11-14 LAB — PLATELET COUNT: Platelet: 105 10*3/uL — ABNORMAL LOW (ref 150–440)

## 2011-11-18 LAB — HEPATIC FUNCTION PANEL A (ARMC)
Albumin: 3.6 g/dL (ref 3.4–5.0)
Bilirubin, Direct: 0.2 mg/dL (ref 0.00–0.20)
Bilirubin,Total: 0.6 mg/dL (ref 0.2–1.0)
SGOT(AST): 119 U/L — ABNORMAL HIGH (ref 15–37)
SGPT (ALT): 173 U/L — ABNORMAL HIGH
Total Protein: 7.4 g/dL (ref 6.4–8.2)

## 2011-11-22 LAB — HEPATIC FUNCTION PANEL A (ARMC)
Albumin: 3.8 g/dL (ref 3.4–5.0)
Bilirubin, Direct: 0.2 mg/dL (ref 0.00–0.20)
Bilirubin,Total: 0.5 mg/dL (ref 0.2–1.0)
SGOT(AST): 77 U/L — ABNORMAL HIGH (ref 15–37)
Total Protein: 7.5 g/dL (ref 6.4–8.2)

## 2012-04-23 ENCOUNTER — Emergency Department (HOSPITAL_COMMUNITY): Payer: Medicaid Other

## 2012-04-23 ENCOUNTER — Emergency Department (HOSPITAL_COMMUNITY)
Admission: EM | Admit: 2012-04-23 | Discharge: 2012-04-23 | Disposition: A | Discharge status: Home / Self Care | Payer: Medicaid Other | Attending: Emergency Medicine | Admitting: Emergency Medicine

## 2012-04-23 ENCOUNTER — Encounter (HOSPITAL_COMMUNITY): Payer: Self-pay | Admitting: *Deleted

## 2012-04-23 DIAGNOSIS — G40909 Epilepsy, unspecified, not intractable, without status epilepticus: Secondary | ICD-10-CM

## 2012-04-23 DIAGNOSIS — S5000XA Contusion of unspecified elbow, initial encounter: Secondary | ICD-10-CM | POA: Insufficient documentation

## 2012-04-23 DIAGNOSIS — R892 Abnormal level of other drugs, medicaments and biological substances in specimens from other organs, systems and tissues: Secondary | ICD-10-CM | POA: Insufficient documentation

## 2012-04-23 DIAGNOSIS — S5001XA Contusion of right elbow, initial encounter: Secondary | ICD-10-CM

## 2012-04-23 DIAGNOSIS — Y929 Unspecified place or not applicable: Secondary | ICD-10-CM | POA: Insufficient documentation

## 2012-04-23 DIAGNOSIS — Z79899 Other long term (current) drug therapy: Secondary | ICD-10-CM | POA: Insufficient documentation

## 2012-04-23 DIAGNOSIS — F1010 Alcohol abuse, uncomplicated: Secondary | ICD-10-CM | POA: Insufficient documentation

## 2012-04-23 DIAGNOSIS — S93336A Other dislocation of unspecified foot, initial encounter: Secondary | ICD-10-CM | POA: Insufficient documentation

## 2012-04-23 DIAGNOSIS — F17200 Nicotine dependence, unspecified, uncomplicated: Secondary | ICD-10-CM | POA: Insufficient documentation

## 2012-04-23 DIAGNOSIS — Z8547 Personal history of malignant neoplasm of testis: Secondary | ICD-10-CM | POA: Insufficient documentation

## 2012-04-23 DIAGNOSIS — Z8546 Personal history of malignant neoplasm of prostate: Secondary | ICD-10-CM | POA: Insufficient documentation

## 2012-04-23 DIAGNOSIS — Y939 Activity, unspecified: Secondary | ICD-10-CM | POA: Insufficient documentation

## 2012-04-23 DIAGNOSIS — F10929 Alcohol use, unspecified with intoxication, unspecified: Secondary | ICD-10-CM

## 2012-04-23 DIAGNOSIS — S92919A Unspecified fracture of unspecified toe(s), initial encounter for closed fracture: Secondary | ICD-10-CM

## 2012-04-23 DIAGNOSIS — X58XXXA Exposure to other specified factors, initial encounter: Secondary | ICD-10-CM | POA: Insufficient documentation

## 2012-04-23 DIAGNOSIS — R7889 Finding of other specified substances, not normally found in blood: Secondary | ICD-10-CM

## 2012-04-23 DIAGNOSIS — K7030 Alcoholic cirrhosis of liver without ascites: Secondary | ICD-10-CM | POA: Insufficient documentation

## 2012-04-23 LAB — GLUCOSE, CAPILLARY: Glucose-Capillary: 86 mg/dL (ref 70–99)

## 2012-04-23 LAB — RAPID URINE DRUG SCREEN, HOSP PERFORMED
Amphetamines: NOT DETECTED
Barbiturates: NOT DETECTED
Benzodiazepines: NOT DETECTED

## 2012-04-23 LAB — COMPREHENSIVE METABOLIC PANEL
ALT: 17 U/L (ref 0–53)
AST: 30 U/L (ref 0–37)
Albumin: 3.6 g/dL (ref 3.5–5.2)
Alkaline Phosphatase: 106 U/L (ref 39–117)
BUN: 9 mg/dL (ref 6–23)
Chloride: 97 mEq/L (ref 96–112)
Potassium: 3.9 mEq/L (ref 3.5–5.1)
Sodium: 137 mEq/L (ref 135–145)
Total Bilirubin: 0.3 mg/dL (ref 0.3–1.2)
Total Protein: 7.2 g/dL (ref 6.0–8.3)

## 2012-04-23 LAB — URINALYSIS, ROUTINE W REFLEX MICROSCOPIC
Glucose, UA: NEGATIVE mg/dL
Ketones, ur: NEGATIVE mg/dL
Leukocytes, UA: NEGATIVE
Nitrite: NEGATIVE
Protein, ur: NEGATIVE mg/dL
Urobilinogen, UA: 0.2 mg/dL (ref 0.0–1.0)

## 2012-04-23 LAB — CBC
MCHC: 36.5 g/dL — ABNORMAL HIGH (ref 30.0–36.0)
Platelets: 161 10*3/uL (ref 150–400)
RDW: 13.3 % (ref 11.5–15.5)
WBC: 7.1 10*3/uL (ref 4.0–10.5)

## 2012-04-23 LAB — PHENYTOIN LEVEL, TOTAL: Phenytoin Lvl: <2.5 ug/mL — ABNORMAL LOW (ref 10.0–20.0)

## 2012-04-23 MED ORDER — HYDROCODONE-ACETAMINOPHEN 5-325 MG PO TABS: ORAL_TABLET | ORAL | Status: DC

## 2012-04-23 MED ORDER — SODIUM CHLORIDE 0.9 % IV SOLN
1000.0000 mg | Freq: Once | INTRAVENOUS | Status: AC
  Administered 2012-04-23: 1000 mg via INTRAVENOUS
  Filled 2012-04-23: qty 20

## 2012-04-23 MED ORDER — HYDROCODONE-ACETAMINOPHEN 5-325 MG PO TABS
2.0000 | ORAL_TABLET | Freq: Once | ORAL | Status: AC
  Administered 2012-04-23: 2 via ORAL
  Filled 2012-04-23: qty 2

## 2012-04-23 MED ORDER — METHOCARBAMOL 500 MG PO TABS
1000.0000 mg | ORAL_TABLET | Freq: Once | ORAL | Status: AC
  Administered 2012-04-23: 1000 mg via ORAL
  Filled 2012-04-23: qty 2

## 2012-04-23 MED ORDER — LORAZEPAM 2 MG/ML IJ SOLN
1.0000 mg | Freq: Once | INTRAMUSCULAR | Status: AC
  Administered 2012-04-23: 1 mg via INTRAVENOUS
  Filled 2012-04-23: qty 1

## 2012-04-23 MED ORDER — CYCLOBENZAPRINE HCL 10 MG PO TABS: 10.0000 mg | ORAL_TABLET | Freq: Three times a day (TID) | ORAL | Status: DC | PRN

## 2012-04-23 NOTE — ED Notes (Signed)
RUE:AVWU<JW> Expected date:<BR> Expected time:<BR> Means of arrival:<BR> Comments:<BR> EMS/LOC-BS 128/hx seizures

## 2012-04-23 NOTE — ED Notes (Signed)
Pt does not remember event. Pt woke up on the floor. PT has history of seizures. On arrival of EMS, pt was sitting on the sofa, shortness of breath, "sore" all over. Bruising to right abdomen, increase swelling to right elbow (pt already had swelling, reports is "increased" tonight, neck pain (pt has been having prior to tonight). cbg 128, iv established.  ETOH.

## 2012-04-23 NOTE — ED Provider Notes (Signed)
History     CSN: [id number]  Arrival date & time 04/23/12  0039   First MD Initiated Contact with Patient 04/23/12 0159      Chief Complaint  Patient presents with  . Loss of Consciousness   Level V caveat for intoxication  (Consider location/radiation/quality/duration/timing/severity/associated sxs/prior treatment) HPI  Patient reports he woke up on the floor today. He thinks he may have had a seizure. He relates some time last week he started having swelling and pain in his left foot. He does not know what happened. He also has bruising to his right elbow. He denies any known injury to these areas. He also states "my back is killing me". He states "I take my medicine every day" however he states he did not take his Dilantin today because he was busy. He then states he hasn't taken any of his medicine today. He also complained to EMS of having neck pain however that is also an old problem. His CBG was 128.  PCP Dr. Bing Plume in [facility] Urologist Dr. Patsi Sears GI Dr. Ewing Schlein  Past Medical History  Diagnosis Date  . 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 Cirrhosis of liver   . Prostate ca   . Seizure     Past Surgical History  Procedure Date  . Prostate surgery   . Appendectomy     No family history on file.  History  Substance Use Topics  . Smoking status: Current Every Day Smoker -- 2.0 packs/day    Types: Cigarettes  . Smokeless tobacco: Not on file  . 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   Review of Systems  All other systems reviewed and are negative.    Allergies  Review of patient's allergies indicates no known allergies.  Home Medications   Current Outpatient Rx  Name  Route  Sig  Dispense  Refill  . ALPRAZOLAM 1 MG PO TABS   Oral   Take 1 mg by mouth 3 (three) times daily as  needed. Anxiety         . CITALOPRAM HYDROBROMIDE 20 MG PO TABS   Oral   Take 20 mg by mouth every morning.          . FUROSEMIDE 40 MG PO TABS   Oral   Take 40 mg by mouth daily.         Marland Kitchen LACTULOSE 10 GM/15ML PO SOLN   Oral   Take 20 g by mouth daily as needed. For constipation         . LEVOTHYROXINE SODIUM 112 MCG PO TABS   Oral   Take 112 mcg by mouth every morning.         Marland Kitchen LORAZEPAM 1 MG PO TABS   Oral   Take 1 mg by mouth every 8 (eight) hours as needed. For anxiety         . PHENYTOIN SODIUM EXTENDED 100 MG PO CAPS   Oral   Take 200 mg by mouth 2 (two) times daily.         Marland Kitchen POTASSIUM CHLORIDE ER 10 MEQ PO CPCR   Oral   Take 10 mEq by mouth every morning.         Marland Kitchen SPIRONOLACTONE 50 MG PO TABS   Oral   Take 50 mg by  mouth 2 (two) times daily.           BP 139/77  Pulse 76  Temp 98.4 F (36.9 C) (Oral)  Resp 20  SpO2 96%  Vital signs normal    Physical Exam  Nursing note and vitals reviewed. Constitutional: He is oriented to person, place, and time. He appears well-developed and well-nourished.  Non-toxic appearance. He does not appear ill. No distress.       Patient placed in a c-collar  HENT:  Head: Normocephalic and atraumatic.  Right Ear: External ear normal.  Left Ear: External ear normal.  Nose: Nose normal. No mucosal edema or rhinorrhea.  Mouth/Throat: Oropharynx is clear and moist and mucous membranes are normal. No dental abscesses or uvula swelling.  Eyes: Conjunctivae normal and EOM are normal. Pupils are equal, round, and reactive to light.  Neck: Full passive range of motion without pain.       C-collar in place  Cardiovascular: Normal rate, regular rhythm and normal heart sounds.  Exam reveals no gallop and no friction rub.   No murmur heard. Pulmonary/Chest: Effort normal and breath sounds normal. No respiratory distress. He has no wheezes. He has no rhonchi. He has no rales. He exhibits no tenderness and no  crepitus.  Abdominal: Soft. Normal appearance and bowel sounds are normal. He exhibits no distension. There is no tenderness. There is no rebound and no guarding.  Musculoskeletal: Normal range of motion. He exhibits no edema and no tenderness.       Moves all extremities well.   Patient has a large area of bruising around his right elbow that appears to be a few days old. He has tenderness to palpation on range of motion but there is no joint effusion noted.  Patient has diffuse swelling of the dorsum of his left foot with bruising over the dorsum of the second and third toes. He has good distal pulses and capillary refill  Patient has pain in his lower lumbar/sacral area of his back there is no obvious trauma to his back.  Neurological: He is alert and oriented to person, place, and time. He has normal strength. No cranial nerve deficit.  Skin: Skin is warm, dry and intact. No rash noted. No erythema. No pallor.  Psychiatric: He has a normal mood and affect. His speech is normal and behavior is normal. His mood appears not anxious.    ED Course  Procedures (including critical care time)   Medications  LORazepam (ATIVAN) injection 1 mg (1 mg Intravenous Given 04/23/12 0311)  HYDROcodone-acetaminophen (NORCO/VICODIN) 5-325 MG per tablet 2 tablet (2 tablet Oral Given 04/23/12 0309)  methocarbamol (ROBAXIN) tablet 1,000 mg (1000 mg Oral Given 04/23/12 0309)  phenytoin (DILANTIN) 1,000 mg in sodium chloride 0.9 % 250 mL IVPB (0 mg Intravenous Stopped 04/23/12 0500)   Patient still denies knowing how he has these new injuries. I attempted to reduce his toe. He had buddy taping placed. Patient was given IV Dilantin so is now therapeutic. Patient states he does not have an orthopedist. His toes were buddy taped and he was placed in a postop shoe.   Patient has are removed the buddy taping. He states that his use crutches in the past however he fell and caught a large laceration to his leg. At  this point I decided not to put him in crutches.  Results for orders placed during the hospital encounter of 04/23/12  PHENYTOIN LEVEL, TOTAL      Component Value Range  Phenytoin Lvl <2.5 (*) 10.0 - 20.0 ug/mL  GLUCOSE, CAPILLARY      Component Value Range   Glucose-Capillary 86  70 - 99 mg/dL   Comment 1 Documented in Chart     Comment 2 Notify RN    URINALYSIS, ROUTINE W REFLEX MICROSCOPIC      Component Value Range   Color, Urine YELLOW  YELLOW   APPearance CLEAR  CLEAR   Specific Gravity, Urine 1.009  1.005 - 1.030   pH 6.0  5.0 - 8.0   Glucose, UA NEGATIVE  NEGATIVE mg/dL   Hgb urine dipstick NEGATIVE  NEGATIVE   Bilirubin Urine NEGATIVE  NEGATIVE   Ketones, ur NEGATIVE  NEGATIVE mg/dL   Protein, ur NEGATIVE  NEGATIVE mg/dL   Urobilinogen, UA 0.2  0.0 - 1.0 mg/dL   Nitrite NEGATIVE  NEGATIVE   Leukocytes, UA NEGATIVE  NEGATIVE  CBC      Component Value Range   WBC 7.1  4.0 - 10.5 K/uL   RBC 4.46  4.22 - 5.81 MIL/uL   Hemoglobin 14.9  13.0 - 17.0 g/dL   HCT 45.4  09.8 - 11.9 %   MCV 91.5  78.0 - 100.0 fL   MCH 33.4  26.0 - 34.0 pg   MCHC 36.5 (*) 30.0 - 36.0 g/dL   RDW 14.7  82.9 - 56.2 %   Platelets 161  150 - 400 K/uL  COMPREHENSIVE METABOLIC PANEL      Component Value Range   Sodium 137  135 - 145 mEq/L   Potassium 3.9  3.5 - 5.1 mEq/L   Chloride 97  96 - 112 mEq/L   CO2 24  19 - 32 mEq/L   Glucose, Bld 90  70 - 99 mg/dL   BUN 9  6 - 23 mg/dL   Creatinine, Ser 1.30  0.50 - 1.35 mg/dL   Calcium 9.3  8.4 - 86.5 mg/dL   Total Protein 7.2  6.0 - 8.3 g/dL   Albumin 3.6  3.5 - 5.2 g/dL   AST 30  0 - 37 U/L   ALT 17  0 - 53 U/L   Alkaline Phosphatase 106  39 - 117 U/L   Total Bilirubin 0.3  0.3 - 1.2 mg/dL   GFR calc non Af Amer >90  >90 mL/min   GFR calc Af Amer >90  >90 mL/min  ETHANOL      Component Value Range   Alcohol, Ethyl (B) 123 (*) 0 - 11 mg/dL  URINE RAPID DRUG SCREEN (HOSP PERFORMED)      Component Value Range   Opiates NONE DETECTED  NONE  DETECTED   Cocaine NONE DETECTED  NONE DETECTED   Benzodiazepines NONE DETECTED  NONE DETECTED   Amphetamines NONE DETECTED  NONE DETECTED   Tetrahydrocannabinol NONE DETECTED  NONE DETECTED   Barbiturates NONE DETECTED  NONE DETECTED   Laboratory interpretation all normal except subtherapeutic Dilantin level, alcohol intoxication    Dg Lumbar Spine Complete  04/23/2012  *RADIOLOGY REPORT*  Clinical Data: Seizure and fall; lower back pain and stiffness.  LUMBAR SPINE - COMPLETE 4+ VIEW  Comparison: Abdominal radiograph performed 09/04/2011, and CT of the abdomen and pelvis performed 10/02/2010  Findings: There is no evidence of fracture or subluxation. Vertebral bodies demonstrate normal height and alignment. Intervertebral disc spaces are preserved.  Mild anterior wedging of vertebral body T11 is likely developmental in nature.  The visualized bowel gas pattern is unremarkable in appearance; air and stool are noted within  the colon.  The sacroiliac joints are within normal limits.  Mild scattered vascular calcification is noted.  IMPRESSION: No evidence of fracture or subluxation along the lumbar spine.   Original Report Authenticated By: Tonia Ghent, M.D.    Dg Elbow Complete Right  04/23/2012  *RADIOLOGY REPORT*  Clinical Data: Status post seizure and fall; bruising and knot at the right olecranon.  RIGHT ELBOW - COMPLETE 3+ VIEW  Comparison: None.  Findings: There is no evidence of fracture or dislocation.  The visualized joint spaces are preserved.  No significant joint effusion is identified.  Focal soft tissue swelling is noted at the olecranon.  IMPRESSION: No evidence of fracture or dislocation.   Original Report Authenticated By: Tonia Ghent, M.D.    Ct Head Wo Contrast  Ct Cervical Spine Wo Contrast  04/23/2012  *RADIOLOGY REPORT*  Clinical Data:  Seizure and fall; right-sided head pain.  Concern for cervical spine injury.  CT HEAD WITHOUT CONTRAST AND CT CERVICAL SPINE WITHOUT  CONTRAST  Technique:  Multidetector CT imaging of the head and cervical spine was performed following the standard protocol without intravenous contrast.  Multiplanar CT image reconstructions of the cervical spine were also generated.  Comparison: CT of the head, and cervical spine radiographs performed 06/09/2010  CT HEAD  Findings: There is no evidence of acute infarction, mass lesion, or intra- or extra-axial hemorrhage on CT.  Mild chronic encephalomalacia is noted at the left temporal lobe, reflecting remote infarct.  The posterior fossa, including the cerebellum, brainstem and fourth ventricle, is within normal limits.  The third and lateral ventricles, and basal ganglia are unremarkable in appearance.  No mass effect or midline shift is seen.  There is no evidence of fracture; visualized osseous structures are unremarkable in appearance.  The orbits are within normal limits. The paranasal sinuses and mastoid air cells are well-aerated.  No significant soft tissue abnormalities are seen.  IMPRESSION:  1.  No evidence of traumatic intracranial injury or fracture. 2.  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  CT CERVICAL SPINE  Findings: There is no evidence of fracture or subluxation. Vertebral bodies demonstrate normal height and alignment. Intervertebral disc spaces are preserved.  Prevertebral soft tissues are within normal limits.  The visualized neural foramina are grossly unremarkable.  The thyroid gland is unremarkable in appearance.  The visualized lung apices are clear.  Mild calcification is noted at the carotid bifurcations bilaterally.  IMPRESSION:  1.  No evidence of fracture or subluxation along the cervical spine.  2.  Mild calcification at the carotid bifurcations bilaterally.   Original Report Authenticated By: Tonia Ghent, M.D.    Dg Foot Complete Left  04/23/2012  *RADIOLOGY REPORT*  Clinical Data: Injury to left foot, with swelling and  bruising about the first, second and third toes.  LEFT FOOT - COMPLETE 3+ VIEW  Comparison: Left foot radiographs performed 10/20/2011  Findings: There is a fracture through the lateral aspect of the base of the second middle phalanx, with medial dislocation of the middle phalanx.  No additional fractures are seen.  Small osseous fragments overlying the dorsum of the talus are likely degenerative in nature.  There is no evidence of talar subluxation; the subtalar joint is unremarkable in appearance.  No significant soft tissue abnormalities are seen.  IMPRESSION: Fracture through the lateral aspect of the base of the second middle phalanx, with medial dislocation of the middle phalanx.   Original Report Authenticated By: Tonia Ghent, M.D.  1. Seizure disorder   2. Subtherapeutic serum dilantin level   3. Fracture dislocation of toe joint   4. Contusion of elbow, right   5. Alcohol intoxication    New Prescriptions   CYCLOBENZAPRINE (FLEXERIL) 10 MG TABLET    Take 1 tablet (10 mg total) by mouth 3 (three) times daily as needed for muscle spasms.   HYDROCODONE-ACETAMINOPHEN (NORCO/VICODIN) 5-325 MG PER TABLET    Take 1 or 2 po Q 6hrs for pain    Plan discharge  Devoria Albe, MD, FACEP    MDM          Ward Givens, MD 04/23/12 [phone]

## 2012-04-23 NOTE — Discharge Instructions (Signed)
You need to take your Dilantin every day. Elevate your left foot and use ice packs to get the swelling down. Keep your toes buddy taped and wear the postop shoe for comfort. You need to be rechecked by an orthopedist. Call Dr. Candise Bowens office to get an appointment. YOU NEED TO STOP DRINKING!!!

## 2012-05-07 ENCOUNTER — Ambulatory Visit: Payer: Self-pay | Admitting: Physician Assistant

## 2012-05-12 ENCOUNTER — Ambulatory Visit: Payer: Self-pay | Admitting: Podiatry

## 2012-05-12 LAB — COMPREHENSIVE METABOLIC PANEL
Alkaline Phosphatase: 141 U/L — ABNORMAL HIGH (ref 50–136)
Chloride: 90 mmol/L — ABNORMAL LOW (ref 98–107)
Co2: 32 mmol/L (ref 21–32)
EGFR (African American): >60
EGFR (Non-African Amer.): >60
Potassium: 3.3 mmol/L — ABNORMAL LOW (ref 3.5–5.1)
SGOT(AST): 20 U/L (ref 15–37)
SGPT (ALT): 20 U/L (ref 12–78)
Total Protein: 8 g/dL (ref 6.4–8.2)

## 2012-05-12 LAB — CBC WITH DIFFERENTIAL/PLATELET
Basophil #: 0.1 10*3/uL (ref 0.0–0.1)
Basophil %: 0.8 %
Eosinophil #: 0.3 10*3/uL (ref 0.0–0.7)
Eosinophil %: 3.7 %
HCT: 41.2 % (ref 40.0–52.0)
HGB: 14.7 g/dL (ref 13.0–18.0)
Lymphocyte #: 1.6 10*3/uL (ref 1.0–3.6)
Lymphocyte %: 18.2 %
MCH: 33.3 pg (ref 26.0–34.0)
MCV: 94 fL (ref 80–100)
Monocyte #: 0.8 x10 3/mm (ref 0.2–1.0)
Neutrophil #: 6.1 10*3/uL (ref 1.4–6.5)
Neutrophil %: 68.3 %
Platelet: 271 10*3/uL (ref 150–440)
RBC: 4.4 10*6/uL (ref 4.40–5.90)
WBC: 8.9 10*3/uL (ref 3.8–10.6)

## 2012-05-14 ENCOUNTER — Ambulatory Visit: Payer: Self-pay | Admitting: Podiatry

## 2012-05-14 LAB — POTASSIUM: Potassium: 3.9 mmol/L (ref 3.5–5.1)

## 2012-07-16 ENCOUNTER — Ambulatory Visit: Payer: Self-pay | Admitting: Physician Assistant

## 2012-09-17 ENCOUNTER — Other Ambulatory Visit: Payer: Self-pay | Admitting: Gastroenterology

## 2012-09-17 DIAGNOSIS — R109 Unspecified abdominal pain: Secondary | ICD-10-CM

## 2012-09-22 ENCOUNTER — Ambulatory Visit
Admission: RE | Admit: 2012-09-22 | Discharge: 2012-09-22 | Disposition: A | Discharge status: Home / Self Care | Payer: Medicaid Other | Source: Ambulatory Visit | Attending: Gastroenterology | Admitting: Gastroenterology

## 2012-09-22 ENCOUNTER — Other Ambulatory Visit: Payer: Self-pay | Admitting: Gastroenterology

## 2012-09-22 DIAGNOSIS — R109 Unspecified abdominal pain: Secondary | ICD-10-CM

## 2012-09-22 MED ORDER — IOHEXOL 300 MG/ML  SOLN: 125.0000 mL | Freq: Once | INTRAMUSCULAR | Status: AC | PRN

## 2012-09-26 HISTORY — PX: ANTERIOR FUSION CERVICAL SPINE: SUR626

## 2012-10-04 ENCOUNTER — Emergency Department: Payer: Self-pay | Admitting: Emergency Medicine

## 2012-10-04 LAB — URINALYSIS, COMPLETE
Bilirubin,UR: NEGATIVE
Ketone: NEGATIVE
Leukocyte Esterase: NEGATIVE
Ph: 6 (ref 4.5–8.0)
Specific Gravity: 1.006 (ref 1.003–1.030)

## 2012-10-04 LAB — COMPREHENSIVE METABOLIC PANEL
Anion Gap: 3 — ABNORMAL LOW (ref 7–16)
BUN: 5 mg/dL — ABNORMAL LOW (ref 7–18)
EGFR (African American): >60
EGFR (Non-African Amer.): >60
Osmolality: 265 (ref 275–301)
Potassium: 3.8 mmol/L (ref 3.5–5.1)
Sodium: 134 mmol/L — ABNORMAL LOW (ref 136–145)
Total Protein: 7.5 g/dL (ref 6.4–8.2)

## 2012-10-04 LAB — CBC
MCH: 33.5 pg (ref 26.0–34.0)
MCV: 95 fL (ref 80–100)

## 2012-10-04 LAB — PROTIME-INR
INR: 1
Prothrombin Time: 13.2 secs (ref 11.5–14.7)

## 2012-10-04 LAB — TSH: Thyroid Stimulating Horm: 0.67 u[IU]/mL

## 2012-10-04 LAB — MAGNESIUM: Magnesium: 1.4 mg/dL — ABNORMAL LOW

## 2012-10-04 LAB — APTT: Activated PTT: 32.7 secs (ref 23.6–35.9)

## 2012-10-11 ENCOUNTER — Emergency Department: Payer: Self-pay | Admitting: Emergency Medicine

## 2012-10-11 LAB — COMPREHENSIVE METABOLIC PANEL
Albumin: 3.8 g/dL (ref 3.4–5.0)
Anion Gap: 8 (ref 7–16)
BUN: 9 mg/dL (ref 7–18)
Bilirubin,Total: 0.9 mg/dL (ref 0.2–1.0)
Calcium, Total: 9.4 mg/dL (ref 8.5–10.1)
Chloride: 97 mmol/L — ABNORMAL LOW (ref 98–107)
Creatinine: 0.7 mg/dL (ref 0.60–1.30)
EGFR (African American): >60
EGFR (Non-African Amer.): >60
Glucose: 94 mg/dL (ref 65–99)
Osmolality: 257 (ref 275–301)
Potassium: 3.9 mmol/L (ref 3.5–5.1)
SGOT(AST): 55 U/L — ABNORMAL HIGH (ref 15–37)
Sodium: 129 mmol/L — ABNORMAL LOW (ref 136–145)
Total Protein: 7.6 g/dL (ref 6.4–8.2)

## 2012-10-11 LAB — URINALYSIS, COMPLETE
Blood: NEGATIVE
Glucose,UR: NEGATIVE mg/dL (ref 0–75)
Ketone: NEGATIVE
Leukocyte Esterase: NEGATIVE
Ph: 5 (ref 4.5–8.0)
RBC,UR: NONE SEEN /HPF (ref 0–5)
Specific Gravity: 1.011 (ref 1.003–1.030)
Squamous Epithelial: NONE SEEN
WBC UR: <1 /HPF (ref 0–5)

## 2012-10-11 LAB — DRUG SCREEN, URINE
Amphetamines, Ur Screen: NEGATIVE (ref ?–1000)
Barbiturates, Ur Screen: NEGATIVE (ref ?–200)
Benzodiazepine, Ur Scrn: POSITIVE (ref ?–200)
Methadone, Ur Screen: NEGATIVE (ref ?–300)
Opiate, Ur Screen: POSITIVE (ref ?–300)
Tricyclic, Ur Screen: NEGATIVE (ref ?–1000)

## 2012-10-11 LAB — CBC
HCT: 38.3 % — ABNORMAL LOW (ref 40.0–52.0)
MCV: 92 fL (ref 80–100)
Platelet: 164 10*3/uL (ref 150–440)
RBC: 4.16 10*6/uL — ABNORMAL LOW (ref 4.40–5.90)
RDW: 14.2 % (ref 11.5–14.5)
WBC: 9.1 10*3/uL (ref 3.8–10.6)

## 2012-10-11 LAB — PROTIME-INR
INR: 1
Prothrombin Time: 13.6 secs (ref 11.5–14.7)

## 2012-10-11 LAB — ETHANOL: Ethanol %: <0.003 % (ref 0.000–0.080)

## 2012-10-11 LAB — CK: CK, Total: 1194 U/L — ABNORMAL HIGH (ref 35–232)

## 2012-10-11 LAB — APTT: Activated PTT: 41.1 secs — ABNORMAL HIGH (ref 23.6–35.9)

## 2012-12-15 DIAGNOSIS — G959 Disease of spinal cord, unspecified: Secondary | ICD-10-CM | POA: Insufficient documentation

## 2012-12-15 DIAGNOSIS — M48061 Spinal stenosis, lumbar region without neurogenic claudication: Secondary | ICD-10-CM | POA: Insufficient documentation

## 2012-12-15 DIAGNOSIS — Z981 Arthrodesis status: Secondary | ICD-10-CM | POA: Insufficient documentation

## 2013-01-18 DIAGNOSIS — C6290 Malignant neoplasm of unspecified testis, unspecified whether descended or undescended: Secondary | ICD-10-CM | POA: Insufficient documentation

## 2013-01-18 DIAGNOSIS — Z8547 Personal history of malignant neoplasm of testis: Secondary | ICD-10-CM | POA: Insufficient documentation

## 2013-01-18 DIAGNOSIS — F419 Anxiety disorder, unspecified: Secondary | ICD-10-CM | POA: Diagnosis present

## 2013-01-26 HISTORY — PX: LUMBAR LAMINECTOMY: SHX95

## 2013-04-18 ENCOUNTER — Emergency Department: Payer: Self-pay | Admitting: Emergency Medicine

## 2013-10-16 ENCOUNTER — Emergency Department: Payer: Self-pay | Admitting: Emergency Medicine

## 2013-10-28 ENCOUNTER — Emergency Department: Payer: Self-pay | Admitting: Emergency Medicine

## 2013-10-28 LAB — COMPREHENSIVE METABOLIC PANEL
ALBUMIN: 3.5 g/dL (ref 3.4–5.0)
ANION GAP: 6 — AB (ref 7–16)
AST: 23 U/L (ref 15–37)
Alkaline Phosphatase: 121 U/L — ABNORMAL HIGH
BILIRUBIN TOTAL: 0.5 mg/dL (ref 0.2–1.0)
BUN: 7 mg/dL (ref 7–18)
CHLORIDE: 98 mmol/L (ref 98–107)
Calcium, Total: 9.3 mg/dL (ref 8.5–10.1)
Co2: 27 mmol/L (ref 21–32)
Creatinine: 1.28 mg/dL (ref 0.60–1.30)
EGFR (African American): >60
EGFR (Non-African Amer.): >60
Glucose: 157 mg/dL — ABNORMAL HIGH (ref 65–99)
OSMOLALITY: 264 (ref 275–301)
Potassium: 3.8 mmol/L (ref 3.5–5.1)
SGPT (ALT): 22 U/L (ref 12–78)
SODIUM: 131 mmol/L — AB (ref 136–145)
TOTAL PROTEIN: 7.7 g/dL (ref 6.4–8.2)

## 2013-10-28 LAB — CBC
HCT: 41.4 % (ref 40.0–52.0)
HGB: 14.6 g/dL (ref 13.0–18.0)
MCH: 35.1 pg — AB (ref 26.0–34.0)
MCHC: 35.3 g/dL (ref 32.0–36.0)
MCV: 99 fL (ref 80–100)
Platelet: 148 10*3/uL — ABNORMAL LOW (ref 150–440)
RBC: 4.17 10*6/uL — ABNORMAL LOW (ref 4.40–5.90)
RDW: 14 % (ref 11.5–14.5)
WBC: 8 10*3/uL (ref 3.8–10.6)

## 2013-11-01 LAB — WOUND CULTURE

## 2013-12-27 ENCOUNTER — Emergency Department (HOSPITAL_COMMUNITY)
Admission: EM | Admit: 2013-12-27 | Discharge: 2013-12-27 | Disposition: A | Discharge status: Home / Self Care | Payer: Medicaid Other | Attending: Emergency Medicine | Admitting: Emergency Medicine

## 2013-12-27 ENCOUNTER — Encounter (HOSPITAL_COMMUNITY): Payer: Self-pay | Admitting: Emergency Medicine

## 2013-12-27 DIAGNOSIS — Z8547 Personal history of malignant neoplasm of testis: Secondary | ICD-10-CM | POA: Diagnosis not present

## 2013-12-27 DIAGNOSIS — Y929 Unspecified place or not applicable: Secondary | ICD-10-CM | POA: Diagnosis not present

## 2013-12-27 DIAGNOSIS — T426X1A Poisoning by other antiepileptic and sedative-hypnotic drugs, accidental (unintentional), initial encounter: Secondary | ICD-10-CM | POA: Diagnosis not present

## 2013-12-27 DIAGNOSIS — Z8546 Personal history of malignant neoplasm of prostate: Secondary | ICD-10-CM | POA: Diagnosis not present

## 2013-12-27 DIAGNOSIS — Z79899 Other long term (current) drug therapy: Secondary | ICD-10-CM | POA: Insufficient documentation

## 2013-12-27 DIAGNOSIS — F17200 Nicotine dependence, unspecified, uncomplicated: Secondary | ICD-10-CM | POA: Insufficient documentation

## 2013-12-27 DIAGNOSIS — T420X1A Poisoning by hydantoin derivatives, accidental (unintentional), initial encounter: Secondary | ICD-10-CM | POA: Diagnosis present

## 2013-12-27 DIAGNOSIS — Y9389 Activity, other specified: Secondary | ICD-10-CM | POA: Insufficient documentation

## 2013-12-27 DIAGNOSIS — T420X1S Poisoning by hydantoin derivatives, accidental (unintentional), sequela: Secondary | ICD-10-CM

## 2013-12-27 DIAGNOSIS — Z8669 Personal history of other diseases of the nervous system and sense organs: Secondary | ICD-10-CM | POA: Insufficient documentation

## 2013-12-27 DIAGNOSIS — Z8719 Personal history of other diseases of the digestive system: Secondary | ICD-10-CM | POA: Insufficient documentation

## 2013-12-27 LAB — COMPREHENSIVE METABOLIC PANEL
ALT: 16 U/L (ref 0–53)
ANION GAP: 12 (ref 5–15)
AST: 22 U/L (ref 0–37)
Albumin: 3.7 g/dL (ref 3.5–5.2)
Alkaline Phosphatase: 88 U/L (ref 39–117)
BUN: 15 mg/dL (ref 6–23)
CALCIUM: 9.1 mg/dL (ref 8.4–10.5)
CO2: 29 mEq/L (ref 19–32)
CREATININE: 0.99 mg/dL (ref 0.50–1.35)
Chloride: 97 mEq/L (ref 96–112)
GLUCOSE: 93 mg/dL (ref 70–99)
Potassium: 4.1 mEq/L (ref 3.7–5.3)
Sodium: 138 mEq/L (ref 137–147)
TOTAL PROTEIN: 6.9 g/dL (ref 6.0–8.3)
Total Bilirubin: 0.3 mg/dL (ref 0.3–1.2)

## 2013-12-27 LAB — RAPID URINE DRUG SCREEN, HOSP PERFORMED
Amphetamines: NOT DETECTED
Barbiturates: NOT DETECTED
Benzodiazepines: NOT DETECTED
COCAINE: NOT DETECTED
OPIATES: NOT DETECTED
Tetrahydrocannabinol: NOT DETECTED

## 2013-12-27 LAB — CBC
HEMATOCRIT: 36.9 % — AB (ref 39.0–52.0)
HEMOGLOBIN: 12.6 g/dL — AB (ref 13.0–17.0)
MCH: 33.2 pg (ref 26.0–34.0)
MCHC: 34.1 g/dL (ref 30.0–36.0)
MCV: 97.1 fL (ref 78.0–100.0)
Platelets: 118 10*3/uL — ABNORMAL LOW (ref 150–400)
RBC: 3.8 MIL/uL — ABNORMAL LOW (ref 4.22–5.81)
RDW: 12.7 % (ref 11.5–15.5)
WBC: 4.5 10*3/uL (ref 4.0–10.5)

## 2013-12-27 LAB — SALICYLATE LEVEL

## 2013-12-27 LAB — ACETAMINOPHEN LEVEL: Acetaminophen (Tylenol), Serum: <15 ug/mL (ref 10–30)

## 2013-12-27 LAB — PHENYTOIN LEVEL, TOTAL: Phenytoin Lvl: 44 ug/mL (ref 10.0–20.0)

## 2013-12-27 NOTE — Discharge Instructions (Signed)
Return to the ED with any concerns including confusion, unable to walk, changes in vision, decreased level of alertness/lethargy, or any other alarming symptoms  The dilantin level today was 44- you should not take any more dilantin until the level has decreased to between 10-20, then resume dilantin at 200mg  po BID

## 2013-12-27 NOTE — ED Provider Notes (Signed)
CSN: [id number]     Arrival date & time 12/27/13  0804 History   First MD Initiated Contact with Patient 12/27/13 0815     No chief complaint on file.    (Consider location/radiation/quality/duration/timing/severity/associated sxs/prior Treatment) HPI Pt presents with concern for elevated dilantin level.  He states that he has been getting 300mg  po BID while in prison, but his dose before entering prison was 200mg  po BID.  He states that over the past month he feels unsteady when he walks and has had some changes in vision.  No vomiting.  No confusion.  No fever/chills.  No abdominal pain.  He has not started any new medications.  There are no other associated systemic symptoms, there are no other alleviating or modifying factors.   Past Medical History  Diagnosis Date  . 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 Cirrhosis of liver   . Prostate ca   . Seizure    Past Surgical History  Procedure Laterality Date  . Prostate surgery    . Appendectomy     History reviewed. No pertinent family history. History  Substance Use Topics  . Smoking status: Current Every Day Smoker -- 1.00 packs/day    Types: Cigarettes  . Smokeless tobacco: Not on file  . Alcohol Use: Yes    Review of Systems ROS reviewed and all otherwise negative except for mentioned in HPI    Allergies  Review of patient's allergies indicates no known allergies.  Home Medications   Prior to Admission medications   Medication Sig Start Date End Date Taking? Authorizing Provider  busPIRone (BUSPAR) 10 MG tablet Take 10 mg by mouth 3 (three) times daily.   Yes Historical Provider, MD  citalopram (CELEXA) 40 MG tablet Take 40 mg by mouth daily.   Yes Historical Provider, MD  furosemide (LASIX) 40 MG tablet Take 40 mg by mouth daily.   Yes Historical Provider, MD  gabapentin (NEURONTIN) 300 MG capsule Take 300 mg by mouth 3 (three) times daily.   Yes Historical Provider, MD  hydrOXYzine (ATARAX/VISTARIL) 50 MG  tablet Take 50 mg by mouth 2 (two) times daily.   Yes Historical Provider, MD  ibuprofen (ADVIL,MOTRIN) 400 MG tablet Take 400 mg by mouth every 8 (eight) hours as needed for moderate pain.   Yes Historical Provider, MD  levothyroxine (SYNTHROID, LEVOTHROID) 112 MCG tablet Take 112 mcg by mouth every morning.   Yes Historical Provider, MD  phenytoin (DILANTIN) 100 MG ER capsule Take 300 mg by mouth 2 (two) times daily.    Yes Historical Provider, MD  potassium chloride (MICRO-K) 10 MEQ CR capsule Take 10 mEq by mouth every morning.   Yes Historical Provider, MD  spironolactone (ALDACTONE) 50 MG tablet Take 50 mg by mouth 2 (two) times daily.   Yes Historical Provider, MD   BP 128/67  Pulse 58  Temp(Src) 98.2 F (36.8 C) (Oral)  Resp 13  SpO2 100% Vitals reviewed Physical Exam Physical Examination: General appearance - alert, well appearing, and in no distress Mental status - alert, oriented to person, place, and time Eyes - pupils equal and reactive, extraocular eye movements intact, mild horizontal nystagmus Mouth - mucous membranes moist, pharynx normal without lesions Neck - supple, no significant adenopathy Chest - clear to auscultation, no wheezes, rales or rhonchi, symmetric air entry Heart - normal rate, regular rhythm, normal S1, S2, no murmurs, rubs, clicks or gallops Abdomen - soft, nontender, nondistended, no masses or organomegaly Neurological -  alert, oriented x 3, normal speech, cranial nerves grossly intact, strength 5/5 in extremities x 4, sensation intact Extremities - peripheral pulses normal, no pedal edema, no clubbing or cyanosis Skin - normal coloration and turgor, no rashes  ED Course  Procedures (including critical care time) Labs Review Labs Reviewed  CBC - Abnormal; Notable for the following:    RBC 3.80 (*)    Hemoglobin 12.6 (*)    HCT 36.9 (*)    Platelets 118 (*)    All other components within normal limits  PHENYTOIN LEVEL, TOTAL - Abnormal; Notable  for the following:    Phenytoin Lvl 44.0 (*)    All other components within normal limits  SALICYLATE LEVEL - Abnormal; Notable for the following:    Salicylate Lvl <4.8 (*)    All other components within normal limits  COMPREHENSIVE METABOLIC PANEL  URINE RAPID DRUG SCREEN (HOSP PERFORMED)  ACETAMINOPHEN LEVEL    Imaging Review No results found.   EKG Interpretation   Date/Time:  Tuesday December 27 2013 08:55:21 EDT Ventricular Rate:  56 PR Interval:  210 QRS Duration: 116 QT Interval:  459 QTC Calculation: 443 R Axis:   -33 Text Interpretation:  Sinus rhythm Prolonged PR interval Incomplete RBBB  and LAFB Probable anteroseptal infarct, old Baseline wander in lead(s) V2  V3 V4 V5 Since previous tracing axis has shifted leftward and LAFB are new  Confirmed by Canary Brim  MD, Jerusalen Mateja [phone]) on 12/27/2013 1:46:45 PM      MDM   Final diagnoses:  Dilantin toxicity, accidental or unintentional, sequela     Dilantin level here is 44, at prison it was 51.  Pt does have some ataxia and nystagmus- no confusion or lethargy.  He has had some nausea, no vomiting.  He will need daily dilantin levels and to hold his dilantin until level is in the 10-20 range- then restart at 200mg  po BID which he states was his dose prior to entering prison.     Threasa Beards, MD 12/28/13 [phone]

## 2013-12-27 NOTE — ED Notes (Signed)
Patient reports to ED from jail, states that jail has been giving him too much dilantin and possibly too much buspar. Pt c/o anxiety, shakiness, lightheadedness, and double vision for the past month and a half.

## 2014-02-21 ENCOUNTER — Ambulatory Visit (HOSPITAL_COMMUNITY)
Admission: RE | Admit: 2014-02-21 | Discharge: 2014-02-21 | Disposition: A | Discharge status: Home / Self Care | Payer: Medicaid Other | Source: Ambulatory Visit | Attending: Adult Health | Admitting: Adult Health

## 2014-02-21 ENCOUNTER — Other Ambulatory Visit (HOSPITAL_COMMUNITY): Payer: Self-pay | Admitting: Adult Health

## 2014-02-21 DIAGNOSIS — C6290 Malignant neoplasm of unspecified testis, unspecified whether descended or undescended: Secondary | ICD-10-CM | POA: Diagnosis present

## 2014-02-21 DIAGNOSIS — Z720 Tobacco use: Secondary | ICD-10-CM | POA: Insufficient documentation

## 2014-06-02 ENCOUNTER — Other Ambulatory Visit: Payer: Self-pay | Admitting: Gastroenterology

## 2014-06-02 DIAGNOSIS — K7460 Unspecified cirrhosis of liver: Secondary | ICD-10-CM

## 2014-06-02 DIAGNOSIS — R635 Abnormal weight gain: Secondary | ICD-10-CM

## 2014-06-08 ENCOUNTER — Other Ambulatory Visit: Payer: Self-pay | Admitting: Gastroenterology

## 2014-06-08 ENCOUNTER — Ambulatory Visit
Admission: RE | Admit: 2014-06-08 | Discharge: 2014-06-08 | Disposition: A | Discharge status: Home / Self Care | Payer: Medicaid Other | Source: Ambulatory Visit | Attending: Gastroenterology | Admitting: Gastroenterology

## 2014-06-08 DIAGNOSIS — R1310 Dysphagia, unspecified: Secondary | ICD-10-CM

## 2014-06-08 DIAGNOSIS — K7460 Unspecified cirrhosis of liver: Secondary | ICD-10-CM

## 2014-06-08 DIAGNOSIS — R635 Abnormal weight gain: Secondary | ICD-10-CM

## 2014-06-12 ENCOUNTER — Other Ambulatory Visit: Payer: Medicaid Other

## 2014-06-16 ENCOUNTER — Ambulatory Visit
Admission: RE | Admit: 2014-06-16 | Discharge: 2014-06-16 | Disposition: A | Discharge status: Home / Self Care | Payer: Medicaid Other | Source: Ambulatory Visit | Attending: Gastroenterology | Admitting: Gastroenterology

## 2014-06-16 DIAGNOSIS — R1310 Dysphagia, unspecified: Secondary | ICD-10-CM
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
## 2014-08-18 NOTE — Op Note (Signed)
PATIENT NAME:  [name], FILIP MR#:  [id number] DATE OF BIRTH:  12-Jan-1961  DATE OF PROCEDURE:  05/14/2012  PREOPERATIVE DIAGNOSIS: Fracture-dislocation left second toe.   POSTOPERATIVE DIAGNOSIS: Fracture-dislocation left second toe.   PROCEDURE: Open reduction and internal fixation, left second toe fracture.   SURGEON: Durward Fortes, DPM  ANESTHESIA: Local MAC.   HEMOSTASIS: Pneumatic tourniquet to left ankle, 250 mmHg.   ESTIMATED BLOOD LOSS: Minimal.   MATERIALS: 0.045 inch K wire.   PATHOLOGY: None.   COMPLICATIONS: Unable to reduce the fracture stably so the head of the proximal phalanx was also removed prior to fixation.  OPERATIVE INDICATIONS: This is a 54 year old male with a history of a fracture-dislocation in his left second toe that happened a little over 2 weeks ago. The patient elects to have surgical fixation of the toe.   DESCRIPTION OF PROCEDURE: The patient was taken to the operating room and placed on the table, in the supine position. Following satisfactory sedation, the left foot was anesthetized with 7 mL of 0.5% Sensorcaine plain around the base of the second toe and forefoot. A pneumatic tourniquet was applied, at the level of the left ankle, and the foot was prepped and draped in the usual sterile fashion. The foot was exsanguinated and the tourniquet inflated to 250 mmHg.   Attention was then directed to the dorsal aspect of the left second toe where an approximate 2 cm linear incision was made coursing proximal to distal. Incision was carried to the level of the joint using sharp and blunt dissection. At the level of the joint, a transverse tenotomy was performed over the dislocated area. There was some significant scarring and arthritic changes. Once the joint was mobilized, it was able to be relocated, but was unstable. Upon further inspection, the fracture fragments off the middle phalanx were plantar with a detached capsular structure and long tendon. The  fracture fragments were then removed. Due to the instability, the head of the proximal phalanx was resected using a pneumatic saw. At this point, the toe was allowed to sit in a much more rectus alignment. The wound was flushed with copious amounts of sterile saline and a 0.045 inch K wire was then driven distally through the middle and distal phalanx out the tip of the toe and then retrograded into the proximal phalanx. Intraoperative FluoroScan views revealed good reduction of the deformity and K wire placement. The wound was again flushed with copious amounts of sterile saline and closed using 4-0 Vicryl simple interrupted suture for tendon reapproximation followed by 5-0 nylon simple interrupted suture for skin closure. Xeroform and a sterile bandage were applied. The tourniquet was released and blood flow noted to return immediately to the left foot, in digits 1 through 5. The patient tolerated the procedure and anesthesia well and was transported to the PAC-U with vital signs stable and in good condition.  ____________________________ Sharlotte Alamo, DPM tc:sb D: 05/14/2012 11:12:46 ET T: 05/14/2012 11:34:21 ET JOB#: [id number]  cc: Sharlotte Alamo, DPM, <Dictator> Ravenna Legore DPM ELECTRONICALLY SIGNED 05/31/2012 10:28
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
## 2015-04-11 ENCOUNTER — Ambulatory Visit (HOSPITAL_COMMUNITY)
Admission: RE | Admit: 2015-04-11 | Discharge: 2015-04-11 | Disposition: A | Discharge status: Home / Self Care | Payer: Medicaid Other | Source: Ambulatory Visit | Attending: Adult Health | Admitting: Adult Health

## 2015-04-11 ENCOUNTER — Other Ambulatory Visit (HOSPITAL_COMMUNITY): Payer: Self-pay | Admitting: Adult Health

## 2015-04-11 DIAGNOSIS — C6290 Malignant neoplasm of unspecified testis, unspecified whether descended or undescended: Secondary | ICD-10-CM | POA: Insufficient documentation

## 2015-05-28 ENCOUNTER — Ambulatory Visit
Admission: RE | Admit: 2015-05-28 | Discharge: 2015-05-28 | Disposition: A | Discharge status: Home / Self Care | Payer: Medicaid Other | Source: Ambulatory Visit | Attending: Radiation Oncology | Admitting: Radiation Oncology

## 2015-05-28 ENCOUNTER — Encounter: Payer: Self-pay | Admitting: Radiation Oncology

## 2015-05-28 VITALS — BP 134/59 | HR 56 | Resp 16 | Ht 70.0 in | Wt 223.2 lb

## 2015-05-28 DIAGNOSIS — E0590 Thyrotoxicosis, unspecified without thyrotoxic crisis or storm: Secondary | ICD-10-CM | POA: Insufficient documentation

## 2015-05-28 DIAGNOSIS — R569 Unspecified convulsions: Secondary | ICD-10-CM | POA: Insufficient documentation

## 2015-05-28 DIAGNOSIS — F17210 Nicotine dependence, cigarettes, uncomplicated: Secondary | ICD-10-CM | POA: Insufficient documentation

## 2015-05-28 DIAGNOSIS — E079 Disorder of thyroid, unspecified: Secondary | ICD-10-CM | POA: Diagnosis not present

## 2015-05-28 DIAGNOSIS — C61 Malignant neoplasm of prostate: Secondary | ICD-10-CM | POA: Insufficient documentation

## 2015-05-28 DIAGNOSIS — M549 Dorsalgia, unspecified: Secondary | ICD-10-CM | POA: Diagnosis not present

## 2015-05-28 DIAGNOSIS — F419 Anxiety disorder, unspecified: Secondary | ICD-10-CM | POA: Insufficient documentation

## 2015-05-28 DIAGNOSIS — F1020 Alcohol dependence, uncomplicated: Secondary | ICD-10-CM | POA: Insufficient documentation

## 2015-05-28 DIAGNOSIS — R300 Dysuria: Secondary | ICD-10-CM | POA: Diagnosis not present

## 2015-05-28 DIAGNOSIS — K7460 Unspecified cirrhosis of liver: Secondary | ICD-10-CM | POA: Diagnosis not present

## 2015-05-28 DIAGNOSIS — F329 Major depressive disorder, single episode, unspecified: Secondary | ICD-10-CM | POA: Diagnosis not present

## 2015-05-28 DIAGNOSIS — N529 Male erectile dysfunction, unspecified: Secondary | ICD-10-CM | POA: Insufficient documentation

## 2015-05-28 DIAGNOSIS — Z8547 Personal history of malignant neoplasm of testis: Secondary | ICD-10-CM | POA: Diagnosis not present

## 2015-05-28 HISTORY — DX: Male erectile dysfunction, unspecified: N52.9

## 2015-05-28 HISTORY — DX: Anxiety disorder, unspecified: F41.9

## 2015-05-28 HISTORY — DX: Alcohol dependence, uncomplicated: F10.20

## 2015-05-28 HISTORY — DX: Major depressive disorder, single episode, unspecified: F32.9

## 2015-05-28 HISTORY — DX: Depression, unspecified: F32.A

## 2015-05-28 HISTORY — DX: Malignant neoplasm of unspecified testis, unspecified whether descended or undescended: C62.90

## 2015-05-28 HISTORY — DX: Elevated prostate specific antigen (PSA): R97.20

## 2015-05-28 HISTORY — DX: Disorder of thyroid, unspecified: E07.9

## 2015-05-28 HISTORY — DX: Dorsalgia, unspecified: M54.9

## 2015-05-28 HISTORY — DX: Premature ejaculation: F52.4

## 2015-05-28 HISTORY — DX: Fracture of neck, unspecified, initial encounter: S12.9XXA

## 2015-05-28 HISTORY — DX: Dysuria: R30.0

## 2015-05-28 HISTORY — DX: Spinal stenosis, cervical region: M48.02

## 2015-05-28 HISTORY — DX: Testicular hypofunction: E29.1

## 2015-05-28 NOTE — Progress Notes (Signed)
Radiation Oncology         [phone] ________________________________  Initial outpatient Consultation  Name: [name] MRN: [id number]  Date: 05/28/2015  DOB: 1960/10/19  [id number], MD  Mack Hook, MD   REFERRING PHYSICIAN: Mack Hook, MD  DIAGNOSIS: 55 y.o. 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 Malignant neoplasm of prostate ([place]) [name] referral to [name] is a 55 y.o. gentleman who has been followed by Dr. Gaynelle Arabian for many years. In 2010 the patient was taken urgently to the operating room for was felt to initially be a testicular torsion however, the patient was diagnosed with a left stage IIB seminoma. Due to the emergent nature of his operation, this is performed with a trans-scrotal approach. Staging workup demonstrated two enlarged periaortic lymph nodes measuring 2.2 and 1.6 cm. He was referred to Dr.Moody radiotherapy. He was treated between 12/06/08 and 01/03/2009 with 25.5 Gy in 15 fractions to the left hemipelvis and periaortic lymph nodes with a periaortic boost of 35.5 Gy over 20 fractions. The patient has been followed on an annual basis by urology since his diagnosis and has been without evidence of disease. His PSA has been followed as well, and in October 2015 was 3.66, in November 2016, it was 9.63. This was rechecked in December 2016 after a course of antibiotic therapy, and was persistently elevated at 11.2.   On 05/16/2015, the patient underwent biopsy revealing adenocarcinoma in 7/12 biopsy sites. Gleason score was 4+5 in 2 of the sites. His prostate measured 14.9 mL, and did not have any palpable nodules.    PREVIOUS RADIATION THERAPY: Yes, to treat Stage IIb seminoma in 2010, 25.5 Gy with boost to 45.5 Gy (Dr.Moody). Transscrotal orchectomy also performed.           j    PAST MEDICAL HISTORY:  Past Medical History  Diagnosis Date  . Cirrhosis of liver ([place])   . Prostate CA ([place])   . Seizure ([place])     pt states last seizure was many years ago and stopped dilantin in 2015  . Seminoma [facility])     s/p left orchiectomy and xrt in 2010  . Back pain   . Dysuria   . Elevated PSA   . ED (erectile dysfunction)   . Hypogonadism, testicular   . Premature ejaculation   . Anxiety   . Alcoholism ([place])   . Depression   . Thyroid disease     hyperthyroidism  . Neck fracture ([place])   . Stenosis, cervical spine      PAST SURGICAL HISTORY: Past Surgical History  Procedure Laterality Date  . Prostate surgery    . Appendectomy    . Left orchiectomy    . Back surgery    . Dental surgery    . Leg surgery    . Neck surgery      FAMILY HISTORY:  Family History  Problem Relation Age of Onset  . Cancer Paternal Uncle     leukemia  . Cancer Father     testicular (per [facility] notes)    SOCIAL HISTORY:  reports that he has been smoking Cigarettes.  He has a 36 pack-year smoking history. He has never used smokeless tobacco. He reports that he drinks alcohol. He reports that he does not use illicit drugs. He is on disability due to  his cervical spine abnormalities, and lives with his girlfriend and pleasant guarding. His sister accompanies him for today's visit, and she has a healthcare power of attorney.  ALLERGIES: Motrin  MEDICATIONS:  Current Outpatient Prescriptions  Medication Sig Dispense Refill  . ARIPiprazole (ABILIFY) 10 MG tablet Take 10 mg by mouth daily.    . citalopram (CELEXA) 40 MG tablet Take 40 mg by mouth daily.    . furosemide (LASIX) 40 MG tablet Take 40 mg by mouth daily.    Marland Kitchen levothyroxine (SYNTHROID, LEVOTHROID) 112 MCG tablet Take 112 mcg by mouth every morning.    Marland Kitchen LORazepam (ATIVAN) 1 MG tablet Take 1 mg by mouth every 8 (eight) hours.    . potassium chloride (MICRO-K) 10 MEQ CR capsule Take 10 mEq by  mouth every morning.    Marland Kitchen spironolactone (ALDACTONE) 50 MG tablet Take 50 mg by mouth 2 (two) times daily.    Marland Kitchen gabapentin (NEURONTIN) 300 MG capsule Take 300 mg by mouth 3 (three) times daily. Reported on 05/28/2015    . ibuprofen (ADVIL,MOTRIN) 400 MG tablet Take 400 mg by mouth every 8 (eight) hours as needed for moderate pain. Reported on 05/28/2015     No current facility-administered medications for this encounter.    REVIEW OF SYSTEMS:  On review of systems the patient reports that his main concern is pain just above his penis. He states that this pain is sharp and shooting and radiates into the left aspect of his scrotum for his testicle has since been removed. He states that this isn't going on for at least a week, and he is currently on antibiotic therapy for urinary tract infection with Dr. Gaynelle Arabian. He continues to experience episodes of hematuria and dysuria and states that his urine is a slight tinged color due to his antibiotic therapy. He reports that he has had trouble with erectile dysfunction for many years and has tried multiple therapies including Cialis, Viagra which have not been beneficial. He is also used penile injections in the past. He states that he is unable to have or maintain an erection. He reports normal bowel function and denies any abdominal pain, spell early satiety nausea or vomiting. He is not experiencing fevers, chills, unintended weight changes. A complete review of systems is obtained and is otherwise negative.    PHYSICAL EXAM:     height is 5\' 10"  (1.778 m) and weight is 223 lb 3.2 oz (101.243 kg). His blood pressure is 134/59 and his pulse is 56. His respiration is 16 and oxygen saturation is 100%.   Pain scale 10/10   IPSS score 4 In general this is a well-appearing Caucasian male in no acute distress. He is alert and oriented times for appropriate throughout the examination. HEENT reveals normocephalic atraumatic, EOMs are intact. Lymphatic review does  not reveal any palpable adenopathy of the cervical, supraclavicular axillary or inguinal chains. Cardiovascular exam reveals a regular rate and rhythm no clicks rubs or murmurs auscultated. Chest is clear to auscultation bilaterally. Abdomen is intact bowel sounds in all quadrants and is soft, non tender, non distended without any palpable hepatosplenomegaly or fascial defects. Lower extremities are negative for pitting edema, deep calf tenderness, cyanosis or clubbing. Clubbing is however noted of his digits bilaterally.  KPS = 90  100 - Normal; no complaints; no evidence of disease. 90   - Able to carry on normal activity; minor signs or symptoms of disease. 80   - Normal activity with effort; some signs or  symptoms of disease. 47   - Cares for self; unable to carry on normal activity or to do active work. 60   - Requires occasional assistance, but is able to care for most of his personal needs. 50   - Requires considerable assistance and frequent medical care. 18   - Disabled; requires special care and assistance. 28   - Severely disabled; hospital admission is indicated although death not imminent. 59   - Very sick; hospital admission necessary; active supportive treatment necessary. 10   - Moribund; fatal processes progressing rapidly. 0     - Dead  Karnofsky DA, Abelmann [place], Craver LS and Burchenal [facility] [phone]) The use of the nitrogen mustards in the palliative treatment of carcinoma: with particular reference to bronchogenic carcinoma Cancer 1 634-56   LABORATORY DATA:  Lab Results  Component Value Date   WBC 4.5 12/27/2013   HGB 12.6* 12/27/2013   HCT 36.9* 12/27/2013   MCV 97.1 12/27/2013   PLT 118* 12/27/2013   Lab Results  Component Value Date   NA 138 12/27/2013   K 4.1 12/27/2013   CL 97 12/27/2013   CO2 29 12/27/2013   Lab Results  Component Value Date   ALT 16 12/27/2013   AST 22 12/27/2013   ALKPHOS 88 12/27/2013   BILITOT 0.3 12/27/2013     RADIOGRAPHY: No  results found.    IMPRESSION: His TIc Stage, Gleason's Score, and PSA put him into the high risk group.  The patient would be a good candidate for IMRT to the prostate with hormone therapy considering past 2010 radiation was of low dose and not specifically directed to the prostate.   PLAN:Today we reviewed the findings and workup thus far.  We discussed the natural history of prostate cancer.  We reviewed the implications of T-stage, Gleason's Score, and PSA on decision-making and outcomes in prostate cancer.  We discussed radiation treatment in the management of prostate cancer with regard to the logistics and delivery of external beam radiation treatment as well as the logistics and delivery of prostate brachytherapy.  He seems to be best suited for external radiation therapy rather than radiotherapy due to his low prosthetic volume, and we would recommend that he consider androgen deprivation therapy if he elects to undergo external beam radiation therapy. Dr. Tammi Klippel discusses that he would recommend 2 months of ADT followed by placement of gold fiducial markers and subsequent simulation and treatment again if the patient elects to go this route. We compared and contrasted each of these approaches and also compared these against prostatectomy.  The patient expressed interest in external beam radiotherapy, but we would recommend that he also  meet with Dr. Alinda Money next week to determine if surgical intervention is an option.  Dr. Tammi Klippel discusses that this may not be indicated as the patient has previously undergone pelvic radiation therapy, and has a history of cirrhotic changes of the liver. Dr. Tammi Klippel has filled out a patient counseling form for him with relevant treatment diagrams and we retained a copy for our records.   Also of note the patient's sister states that he is due for colonoscopy, and we would recommend that he undergo this prior to radiotherapy. The patient and his sister states  agreement and understanding. In addition, given the patient's persistent pain in the pelvic area, he will follow-up with Dr. Gaynelle Arabian for further recommendations of care.    The above documentation reflects my direct findings during this shared patient visit. Please see the  separate note by Dr. Tammi Klippel on this date for the remainder of the patient's plan of care.  Carola Rhine, PAC   This document serves as a record of services personally performed by Shona Simpson, PAC and Tyler Pita, MD. It was created on their behalf by Derek Mound, a trained medical scribe. The creation of this record is based on the scribe's personal observations and the provider's statements to them. This document has been checked and approved by the attending provider.

## 2015-05-28 NOTE — Progress Notes (Signed)
GU Location of Tumor / Histology: prostatic adenocarcinoma  If Prostate Cancer, Gleason Score is (4 + 5) and PSA is (11.20) December 2016.   [name] returned to Dr. Gaynelle Arabian for yearly follow up and to obtain Viagra  Biopsies of prostate (if applicable) revealed:     Past/Anticipated interventions by urology, if any: PSA draw, DRE, antibiotic, PSA draw, prostate biopsy, referral to radiation oncology.   Past/Anticipated interventions by medical oncology, if any: no  Weight changes, if any: no  Bowel/Bladder complaints, if any: ED, occasional urinary incontinence, describes a strong steady urine stream without difficulty emptying, reports dysuria and hematuria.  Recently diagnosed with UTI. Prescribed antibiotic by urologist.   Nausea/Vomiting, if any: no  Pain issues, if any:  Perineal pain just above penis 10 on a scale of 0-10.   SAFETY ISSUES:  Prior radiation? yes  Pacemaker/ICD? no  Possible current pregnancy? no  Is the patient on methotrexate? no  Current Complaints / other details:  55 year old. Male. Here with sister (POA) and girlfriend.

## 2015-05-28 NOTE — Progress Notes (Signed)
See progress note under physician encounter. 

## 2015-05-29 ENCOUNTER — Encounter: Payer: Self-pay | Admitting: Radiation Oncology

## 2015-05-29 DIAGNOSIS — C61 Malignant neoplasm of prostate: Secondary | ICD-10-CM | POA: Insufficient documentation

## 2015-05-29 NOTE — Addendum Note (Signed)
Encounter addended by: Hayden Pedro, PA-C on: 05/29/2015  9:31 AM<BR>     Documentation filed: Notes Section

## 2015-05-29 NOTE — Patient Instructions (Signed)
Contact our office if you have any questions following today's appointment: [phone].  

## 2015-05-31 ENCOUNTER — Encounter: Payer: Self-pay | Admitting: *Deleted

## 2015-05-31 NOTE — Progress Notes (Signed)
[place] Psychosocial Distress Screening Clinical Social Work  Clinical Social Work was referred by distress screening protocol.  The patient scored a 8 on the Psychosocial Distress Thermometer which indicates severe distress. Clinical Social Worker phoned pt to assess for distress and other psychosocial needs. CSW spoke with pt's sister/HCPOA and explained role of CSW/Pt and Family Support team. Per sister, things are going well currently, but she will share our info for any concerns. CSW also discussed Prostate Cancer Support group as option for additional support. She appreciated call and will share info.   ONCBCN DISTRESS SCREENING 05/28/2015  Screening Type Initial Screening  Distress experienced in past week (1-10) 8  Emotional problem type Depression  Information Concerns Type Lack of info about diagnosis  Physician notified of physical symptoms Yes  Referral to clinical psychology No  Referral to clinical social work Yes    Clinical Social Worker follow up needed: No.  If yes, follow up plan: Loren Racer, [place]  [facility] Phone: [phone] Fax: [phone]

## 2015-06-12 ENCOUNTER — Telehealth: Payer: Self-pay | Admitting: Radiation Oncology

## 2015-06-12 NOTE — Telephone Encounter (Signed)
I spoke with the patient's sister and the appointment was rescheduled for 06/28/15 with Dr. Alinda Money. He also has been in contact with Dr. Watt Climes who does not need to do any colonoscopies for the next 4-5 years.

## 2015-06-29 ENCOUNTER — Other Ambulatory Visit (HOSPITAL_COMMUNITY): Payer: Self-pay | Admitting: Urology

## 2015-06-29 DIAGNOSIS — C61 Malignant neoplasm of prostate: Secondary | ICD-10-CM

## 2015-07-03 ENCOUNTER — Telehealth: Payer: Self-pay | Admitting: Radiation Oncology

## 2015-07-03 ENCOUNTER — Other Ambulatory Visit: Payer: Self-pay | Admitting: Urology

## 2015-07-03 NOTE — Telephone Encounter (Addendum)
The patient's sister [name] called back and her brother is going for a CT and bone scan next week. Provided that these are clear, they are planning on moving forward with surgery. I encouraged [name] to contact us if there are any concerns they have moving forward but wish her brother all the best with his upcoming plans for surgery.

## 2015-07-03 NOTE — Telephone Encounter (Signed)
I called to speak with the patient's sister [name] to see how the appointment went with Dr. Alinda Money last week. She was not available, and I LM asking her to call me at her convenience.

## 2015-07-09 ENCOUNTER — Encounter (HOSPITAL_COMMUNITY)
Admission: RE | Admit: 2015-07-09 | Discharge: 2015-07-09 | Disposition: A | Discharge status: Home / Self Care | Payer: Medicaid Other | Source: Ambulatory Visit | Attending: Urology | Admitting: Urology

## 2015-07-09 DIAGNOSIS — C61 Malignant neoplasm of prostate: Secondary | ICD-10-CM | POA: Diagnosis present

## 2015-07-09 MED ORDER — TECHNETIUM TC 99M MEDRONATE IV KIT
26.2000 | PACK | Freq: Once | INTRAVENOUS | Status: AC | PRN
  Administered 2015-07-09: 26.2 via INTRAVENOUS

## 2015-07-13 ENCOUNTER — Other Ambulatory Visit (HOSPITAL_COMMUNITY): Payer: Self-pay | Admitting: Urology

## 2015-07-13 ENCOUNTER — Ambulatory Visit (HOSPITAL_COMMUNITY)
Admission: RE | Admit: 2015-07-13 | Discharge: 2015-07-13 | Disposition: A | Discharge status: Home / Self Care | Payer: Medicaid Other | Source: Ambulatory Visit | Attending: Urology | Admitting: Urology

## 2015-07-13 DIAGNOSIS — C61 Malignant neoplasm of prostate: Secondary | ICD-10-CM

## 2015-07-20 ENCOUNTER — Ambulatory Visit: Payer: Medicaid Other | Attending: Urology | Admitting: Physical Therapy

## 2015-07-20 ENCOUNTER — Encounter: Payer: Self-pay | Admitting: Physical Therapy

## 2015-07-20 DIAGNOSIS — R531 Weakness: Secondary | ICD-10-CM | POA: Insufficient documentation

## 2015-07-20 NOTE — Patient Instructions (Signed)
Quick Contraction: Gravity Resisted (Sitting)    Sitting, quickly squeeze then fully relax pelvic floor. Perform _1__ sets of _5__. Rest for _1__ seconds between sets. Do _4__ times a day. Laying on back or sitting Copyright  VHI. All rights reserved.  Slow Contraction: Gravity Resisted (Sitting)    Sitting, slowly squeeze pelvic floor for __5_ seconds. Rest for __5_ seconds. Repeat _10__ times. Do _4__ times a day. Sitting or saying on back Copyright  VHI. All rights reserved.  Sleeping on Back    Place pillow under knees. A pillow with cervical support and a roll around waist are also helpful.   Copyright  VHI. All rights reserved.  Sleeping on Side    Place pillow between knees. Use cervical support under neck and a roll around waist as needed.Pillow between knees and feet.   Copyright  VHI. All rights reserved.  Gluteal Squeeze    Squeeze buttocks muscles as tightly as possible while counting out loud to _5___. Repeat __5__ times. Do ___2_ sessions per day. After surgery.Hip Stretch    Put right ankle over left knee. Let right knee fall downward, but keep ankle in place. Feel the stretch in hip. May push down gently with hand to feel stretch. Hold ____ seconds while counting out loud. Repeat with other leg. Repeat ____ times. Do ____ sessions per day.  http://gt2.exer.us/497   Copyright  VHI. All rights reserved.   http://gt2.exer.us/363   Copyright  VHI. All rights reserved.  Hip Stretch    Put right ankle over left knee. Let right knee fall downward, but keep ankle in place. Feel the stretch in hip. May push down gently with hand to feel stretch. Hold __10__ seconds while counting out loud. Repeat with other leg. Repeat __5__ times. Do __2__ sessions per day. Also can do laying on back. http://gt2.exer.us/497   Copyright  VHI. All rights reserved.  After surgery call doctor id blood in urine or increased pain in right leg at night.    Butterfly, Supine    Lie on back, feet together. Lower knees toward floor. Hold 30___ seconds. Repeat _2__ times per session. Do _2__ sessions per day.   Copyright  VHI. All rights reserved.      Scar Massage  Scar massage is done to improve the mobility of scar, decrease scar tissue from building up, reduce adhesions, and prevent Keloids from forming. Start scar massage after scabs have fallen off by themselves and no open areas. The first few weeks after surgery, it is normal for a scar to appear pink or red and slightly raised. Scars can itch or have areas of numbness. Some scars may be sensitive.   Direct Scar massage: after scar is healed, no opening, no scab 1.  Place pads of two fingers together directly on the scar starting at one end of the scar. Move the fingers up and down across the scar holding 5 seconds one direction.  Then go opposite direction hold 5 seconds.  2. Move over to the next section of the scar and repeat.  Work your way along the entire length of the scar.   3. Next make diagonal movements along the scar holding 5 seconds at one direction. 4. Next movement is side to side. 5. Do not rub fingers over the scar.  Instead keep firm pressure and move scar over the tissue it is on top   Scar Lift and Roll 12 weeks after surgery. 1. Pinch a small amount of the scar between your  first two fingers and thumb.  2. Roll the scar between your fingers for 5 to 15 seconds. 3. Move along the scar and repeat until you have massaged the entire length of scar.   Stop the massage and call your doctor if you notice: 1. Increased redness 2. Bleeding from scar 3. Seepage coming from the scar 4. Scar is warmer and has increased pain     Isometric Hold (Hook-Lying)    Lie with hips and knees bent. Slowly inhale, and then exhale. Pull navel toward spine and Hold for _5__ seconds. Continue to breathe in and out during hold. Rest for _5__ seconds. Repeat __10_ times. Do  _2__ times a day.   Copyright  VHI. All rights reserved.  [facility] [address], [place], [place] [address] Phone # [phone] Fax [phone] [email or web address]

## 2015-07-20 NOTE — Therapy (Signed)
[facility] [address], [place], [place], [address] Phone: [phone]   Fax:  [phone]  Physical Therapy Evaluation  Patient Details  Name: [name] MRN: [id number] Date of Birth: 06/19/1960 Referring Provider: Dr. Raynelle Bring  Encounter Date: 07/20/2015      PT End of Session - 07/20/15 1103    Visit Number 1   PT Start Time 1000   PT Stop Time 1100   PT Time Calculation (min) 60 min   Activity Tolerance Patient tolerated treatment well   Behavior During Therapy [facility] for tasks assessed/performed      Past Medical History  Diagnosis Date  . Cirrhosis of liver ([place])   . Prostate CA ([place])   . Seizure ([place])     pt states last seizure was many years ago and stopped dilantin in 2015  . [facility])     s/p left orchiectomy and xrt in 2010  . Back pain   . Dysuria   . Elevated PSA   . ED (erectile dysfunction)   . Hypogonadism, testicular   . Premature ejaculation   . Anxiety   . Alcoholism ([place])   . Depression   . Thyroid disease     hyperthyroidism  . Neck fracture ([place])   . Stenosis, cervical spine     Past Surgical History  Procedure Laterality Date  . Prostate surgery    . Appendectomy    . Left orchiectomy    . Back surgery    . Dental surgery    . Leg surgery    . Neck surgery      There were no vitals filed for this visit.  Visit Diagnosis:  Weakness - Plan: PT plan of care cert/re-cert      Subjective Assessment - 07/20/15 1007    Subjective Patient was diagnosed for prostate cancer 2 months. Patient is having surgery on 07/31/2015.  Patient has back pain but has had neck and back surgery.    Patient Stated Goals learn exercises for pelvic floor exercise   Currently in Pain? No/denies            [facility] PT Assessment - 07/20/15 0001    Assessment   Medical Diagnosis C61 Malignant neoplasm of prostate   Referring Provider Dr. Raynelle Bring   Onset Date/Surgical  Date 05/30/15   Prior Therapy None   Precautions   Precautions Other (comment)   Precaution Comments Cancer precautions    Balance Screen   Has the patient fallen in the past 6 months No   Has the patient had a decrease in activity level because of a fear of falling?  No   Is the patient reluctant to leave their home because of a fear of falling?  No   Home Ecologist residence   Prior Function   Level of Independence Independent   Vocation On disability   Cognition   Overall Cognitive Status Within Functional Limits for tasks assessed   ROM / Strength   AROM / PROM / Strength AROM;Strength;PROM   AROM   Overall AROM Comments lumbar ROM is full   PROM   Left Hip Flexion 90   Left Hip ABduction 15   Strength   Overall Strength Comments bil. hip strength is 4/5, abdominal strength is 2/5   Right Hip   Right Hip Flexion 90   Right Hip ABduction 15  Pelvic Floor Special Questions - 07/20/15 0001    Biofeedback rest 4.79uv, 3  quick contraction max 38.60, avg. 12.89 uv, 10 second contraction  15.78 uv; 20 second contraction 13.40 uv; rest 5.44 uv  standing   Biofeedback sensor type Surface  on anal surface   Biofeedback Activity Quick contraction;10 second hold  assessment                  PT Education - 07/20/15 1102    Education provided Yes   Education Details pelvic floor contraction, stretches, scar massage, when to call MD after surgery   Person(s) Educated Patient   Methods Explanation;Demonstration;Verbal cues;Handout   Comprehension Returned demonstration;Verbalized understanding             PT Long Term Goals - 07/20/15 1105    PT LONG TERM GOAL #1   Title understand how to perform pelvic floor contraction for continence   Time 1   Period Days   Status Achieved   PT LONG TERM GOAL #2   Title understand mens incontinence devices for after surgery   Time 1   Period Days   Status  Achieved   PT LONG TERM GOAL #3   Title understand exercise program to strengthen pelvic floor after surgery   Time 1   Period Weeks   Status Achieved   PT LONG TERM GOAL #4   Title understand precautions for when to call Md after surgery   Time 1   Period Days   Status Achieved               Plan - 07/20/15 1143    Clinical Impression Statement Patient is a 55 year old male with diagnosis of Malignant neoplasm of prostate.  Patient was diagnosed 2 months ago.  Patient reports no pain. Patient is having prostate surgery  on  07/30/2015.  Bilateral hip strength is 4/5.  Abdominal strength is 2/5.  Pelvic floor EMG was as followed: rest 5uv, 3 quick contraction max 38.60, average 12.9uv, 10 second contraction 15.78 uv, 20 second contraction 13.40uv, and rest 5.34uv. Patient is able to isolate the pelvic floor contraction  by the end of treatment.  Patient able to contract the abdominals for bracing.  Patient understands signs to look for after surgery .  Patient understands how to progress his exercises after surgery. Patient is only coming for 1 visit  due to insurance.    Pt will benefit from skilled therapeutic intervention in order to improve on the following deficits Decreased strength   Rehab Potential Excellent   Clinical Impairments Affecting Rehab Potential None   PT Frequency 1x / week   PT Duration --  1 visit   PT Treatment/Interventions Therapeutic exercise;Therapeutic activities;Patient/family education   PT Next Visit Plan Discharge to HEP   PT Home Exercise Plan Current HEP   Recommended Other Services None   Consulted and Agree with Plan of Care Patient         Problem List Patient Active Problem List   Diagnosis Date Noted  . Malignant neoplasm of prostate ([place]) 05/29/2015  . URI 03/01/2010  . [place] DISEASE, LUMBAR SPINE 02/17/2010  . 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 DEPRESSION 10/31/2009  .  ANEMIA 08/06/2009  . MALIGNANT NEOPLASM OF OTHER&UNSPECIFIED TESTIS 12/01/2008  . ACQUIRED ABSENCE OF ORGAN GENITAL ORGANS 11/07/2008  . 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 HYPOTHYROIDISM, POSTABLATION 09/13/2008  . BACK PAIN, LUMBAR 07/07/2007  .  OTHER POSTABLATIVE HYPOTHYROIDISM 03/30/2007  . ANXIETY DISORDER, GENERALIZED 12/17/2006  . ABUSE, ALCOHOL, IN REMISSION 12/17/2006  . CIRRHOSIS, LIVER NOS 12/17/2006  . SEIZURE DISORDER 12/17/2006  . HX, PNEUMONIA 12/17/2006    Earlie Counts, PT 07/20/2015 11:55 AM   [facility] [address], [place], [place], [address] Phone: [phone]   Fax:  [phone]  Name: [name] MRN: [id number] Date of Birth: June 11, 1960

## 2015-07-23 NOTE — Patient Instructions (Signed)
[name]  07/23/2015   Your procedure is scheduled on: 07/30/2015    Report to [facility] Main  Entrance take [facility]  elevators to 3rd floor to  [place] at   0900 AM.  Call this number if you have problems the morning of surgery [phone]   Remember: ONLY 1 PERSON MAY GO WITH YOU TO SHORT STAY TO GET  READY MORNING OF [place].  Do not eat food or drink liquids :After Midnight.             Magnesium Citrate- 8-10 ounces by 12 noon day before surgery.              Fleets enema nite before surgery.       Take these medicines the morning of surgery with A SIP OF WATER: Synthroid, Lorazepam( Ativan)                                You may not have any metal on your body including hair pins and              piercings  Do not wear jewelry, , lotions, powders or perfumes, deodorant                         Men may shave face and neck.   Do not bring valuables to the hospital. [place].  Contacts, dentures or bridgework may not be worn into surgery.  Leave suitcase in the car. After surgery it may be brought to your room.         Special Instructions: coughing and deep breathing exercises, leg exercises               Please read over the following fact sheets you were given: _____________________________________________________________________             [facility] - Preparing for Surgery Before surgery, you can play an important role.  Because skin is not sterile, your skin needs to be as free of germs as possible.  You can reduce the number of germs on your skin by washing with CHG (chlorahexidine gluconate) soap before surgery.  CHG is an antiseptic cleaner which kills germs and bonds with the skin to continue killing germs even after washing. Please DO NOT use if you have an allergy to CHG or antibacterial soaps.  If your skin becomes reddened/irritated stop using the CHG and inform your  nurse when you arrive at Short Stay. Do not shave (including legs and underarms) for at least 48 hours prior to the first CHG shower.  You may shave your face/neck. Please follow these instructions carefully:  1.  Shower with CHG Soap the night before surgery and the  morning of Surgery.  2.  If you choose to wash your hair, wash your hair first as usual with your  normal  shampoo.  3.  After you shampoo, rinse your hair and body thoroughly to remove the  shampoo.                           4.  Use CHG as you would any other liquid soap.  You  can apply chg directly  to the skin and wash                       Gently with a scrungie or clean washcloth.  5.  Apply the CHG Soap to your body ONLY FROM THE NECK DOWN.   Do not use on face/ open                           Wound or open sores. Avoid contact with eyes, ears mouth and genitals (private parts).                       Wash face,  Genitals (private parts) with your normal soap.             6.  Wash thoroughly, paying special attention to the area where your surgery  will be performed.  7.  Thoroughly rinse your body with warm water from the neck down.  8.  DO NOT shower/wash with your normal soap after using and rinsing off  the CHG Soap.                9.  Pat yourself dry with a clean towel.            10.  Wear clean pajamas.            11.  Place clean sheets on your bed the night of your first shower and do not  sleep with pets. Day of Surgery : Do not apply any lotions/deodorants the morning of surgery.  Please wear clean clothes to the hospital/surgery center.  FAILURE TO FOLLOW THESE INSTRUCTIONS MAY RESULT IN THE CANCELLATION OF YOUR SURGERY PATIENT SIGNATURE_________________________________  NURSE SIGNATURE__________________________________  ________________________________________________________________________  WHAT IS A BLOOD TRANSFUSION? Blood Transfusion Information  A transfusion is the replacement of blood or some of its  parts. Blood is made up of multiple cells which provide different functions.  Red blood cells carry oxygen and are used for blood loss replacement.  White blood cells fight against infection.  Platelets control bleeding.  Plasma helps clot blood.  Other blood products are available for specialized needs, such as hemophilia or other clotting disorders. BEFORE THE TRANSFUSION  Who gives blood for transfusions?   Healthy volunteers who are fully evaluated to make sure their blood is safe. This is blood bank blood. Transfusion therapy is the safest it has ever been in the practice of medicine. Before blood is taken from a donor, a complete history is taken to make sure that person has no history of diseases nor engages in risky social behavior (examples are intravenous drug use or sexual activity with multiple partners). The donor's travel history is screened to minimize risk of transmitting infections, such as malaria. The donated blood is tested for signs of infectious diseases, such as HIV and hepatitis. The blood is then tested to be sure it is compatible with you in order to minimize the chance of a transfusion reaction. If you or a relative donates blood, this is often done in anticipation of surgery and is not appropriate for emergency situations. It takes many days to process the donated blood. RISKS AND COMPLICATIONS Although transfusion therapy is very safe and saves many lives, the main dangers of transfusion include:  1. Getting an infectious disease. 2. Developing a transfusion reaction. This is an allergic reaction to something in the blood you were given. Every  precaution is taken to prevent this. The decision to have a blood transfusion has been considered carefully by your caregiver before blood is given. Blood is not given unless the benefits outweigh the risks. AFTER THE TRANSFUSION  Right after receiving a blood transfusion, you will usually feel much better and more energetic.  This is especially true if your red blood cells have gotten low (anemic). The transfusion raises the level of the red blood cells which carry oxygen, and this usually causes an energy increase.  The nurse administering the transfusion will monitor you carefully for complications. HOME CARE INSTRUCTIONS  No special instructions are needed after a transfusion. You may find your energy is better. Speak with your caregiver about any limitations on activity for underlying diseases you may have. SEEK MEDICAL CARE IF:   Your condition is not improving after your transfusion.  You develop redness or irritation at the intravenous (IV) site. SEEK IMMEDIATE MEDICAL CARE IF:  Any of the following symptoms occur over the next 12 hours:  Shaking chills.  You have a temperature by mouth above 102 F (38.9 C), not controlled by medicine.  Chest, back, or muscle pain.  People around you feel you are not acting correctly or are confused.  Shortness of breath or difficulty breathing.  Dizziness and fainting.  You get a rash or develop hives.  You have a decrease in urine output.  Your urine turns a dark color or changes to pink, red, or brown. Any of the following symptoms occur over the next 10 days:  You have a temperature by mouth above 102 F (38.9 C), not controlled by medicine.  Shortness of breath.  Weakness after normal activity.  The white part of the eye turns yellow (jaundice).  You have a decrease in the amount of urine or are urinating less often.  Your urine turns a dark color or changes to pink, red, or brown. Document Released: 04/11/2000 Document Revised: 07/07/2011 Document Reviewed: 11/29/2007 ExitCare Patient Information 2014 [place].  _______________________________________________________________________  Incentive Spirometer  An incentive spirometer is a tool that can help keep your lungs clear and active. This tool measures how well you are filling  your lungs with each breath. Taking long deep breaths may help reverse or decrease the chance of developing breathing (pulmonary) problems (especially infection) following:  A long period of time when you are unable to move or be active. BEFORE THE PROCEDURE   If the spirometer includes an indicator to show your best effort, your nurse or respiratory therapist will set it to a desired goal.  If possible, sit up straight or lean slightly forward. Try not to slouch.  Hold the incentive spirometer in an upright position. INSTRUCTIONS FOR USE  3. Sit on the edge of your bed if possible, or sit up as far as you can in bed or on a chair. 4. Hold the incentive spirometer in an upright position. 5. Breathe out normally. 6. Place the mouthpiece in your mouth and seal your lips tightly around it. 7. Breathe in slowly and as deeply as possible, raising the piston or the ball toward the top of the column. 8. Hold your breath for 3-5 seconds or for as long as possible. Allow the piston or ball to fall to the bottom of the column. 9. Remove the mouthpiece from your mouth and breathe out normally. 10. Rest for a few seconds and repeat Steps 1 through 7 at least 10 times every 1-2 hours when you are awake.  Take your time and take a few normal breaths between deep breaths. 11. The spirometer may include an indicator to show your best effort. Use the indicator as a goal to work toward during each repetition. 12. After each set of 10 deep breaths, practice coughing to be sure your lungs are clear. If you have an incision (the cut made at the time of surgery), support your incision when coughing by placing a pillow or rolled up towels firmly against it. Once you are able to get out of bed, walk around indoors and cough well. You may stop using the incentive spirometer when instructed by your caregiver.  RISKS AND COMPLICATIONS  Take your time so you do not get dizzy or light-headed.  If you are in pain, you  may need to take or ask for pain medication before doing incentive spirometry. It is harder to take a deep breath if you are having pain. AFTER USE  Rest and breathe slowly and easily.  It can be helpful to keep track of a log of your progress. Your caregiver can provide you with a simple table to help with this. If you are using the spirometer at home, follow these instructions: [place] IF:   You are having difficultly using the spirometer.  You have trouble using the spirometer as often as instructed.  Your pain medication is not giving enough relief while using the spirometer.  You develop fever of 100.5 F (38.1 C) or higher. SEEK IMMEDIATE MEDICAL CARE IF:   You cough up bloody sputum that had not been present before.  You develop fever of 102 F (38.9 C) or greater.  You develop worsening pain at or near the incision site. MAKE SURE YOU:   Understand these instructions.  Will watch your condition.  Will get help right away if you are not doing well or get worse. Document Released: 08/25/2006 Document Revised: 07/07/2011 Document Reviewed: 10/26/2006 [facility] Patient Information 2014 [place], [place].   ________________________________________________________________________

## 2015-07-24 ENCOUNTER — Encounter (HOSPITAL_COMMUNITY): Payer: Self-pay

## 2015-07-24 ENCOUNTER — Encounter (HOSPITAL_COMMUNITY)
Admission: RE | Admit: 2015-07-24 | Discharge: 2015-07-24 | Disposition: A | Discharge status: Home / Self Care | Payer: Medicaid Other | Source: Ambulatory Visit | Attending: Urology | Admitting: Urology

## 2015-07-24 DIAGNOSIS — C61 Malignant neoplasm of prostate: Secondary | ICD-10-CM | POA: Diagnosis not present

## 2015-07-24 DIAGNOSIS — Z01810 Encounter for preprocedural cardiovascular examination: Secondary | ICD-10-CM | POA: Diagnosis present

## 2015-07-24 DIAGNOSIS — Z01812 Encounter for preprocedural laboratory examination: Secondary | ICD-10-CM | POA: Insufficient documentation

## 2015-07-24 HISTORY — DX: Hypothyroidism, unspecified: E03.9

## 2015-07-24 HISTORY — DX: Pneumonia, unspecified organism: J18.9

## 2015-07-24 LAB — CBC
HEMATOCRIT: 41.5 % (ref 39.0–52.0)
HEMOGLOBIN: 14.1 g/dL (ref 13.0–17.0)
MCH: 32.7 pg (ref 26.0–34.0)
MCHC: 34 g/dL (ref 30.0–36.0)
MCV: 96.3 fL (ref 78.0–100.0)
Platelets: 181 10*3/uL (ref 150–400)
RBC: 4.31 MIL/uL (ref 4.22–5.81)
RDW: 14.7 % (ref 11.5–15.5)
WBC: 7.9 10*3/uL (ref 4.0–10.5)

## 2015-07-24 LAB — BASIC METABOLIC PANEL
ANION GAP: 10 (ref 5–15)
BUN: 10 mg/dL (ref 6–20)
CALCIUM: 9.3 mg/dL (ref 8.9–10.3)
CO2: 26 mmol/L (ref 22–32)
Chloride: 103 mmol/L (ref 101–111)
Creatinine, Ser: 1.01 mg/dL (ref 0.61–1.24)
GFR calc Af Amer: >60 mL/min (ref >60)
GLUCOSE: 101 mg/dL — AB (ref 65–99)
POTASSIUM: 4.2 mmol/L (ref 3.5–5.1)
SODIUM: 139 mmol/L (ref 135–145)

## 2015-07-24 LAB — TYPE AND SCREEN
ABO/RH(D): O POS
Antibody Screen: NEGATIVE

## 2015-07-24 NOTE — Progress Notes (Signed)
2V CXR- 12/16- EPIC .

## 2015-07-24 NOTE — Progress Notes (Signed)
Patient has bowel prep instructions per office of Dr Alinda Money per patient .

## 2015-07-25 NOTE — Progress Notes (Signed)
Final EKG done 07/24/15- EPIC

## 2015-07-27 ENCOUNTER — Other Ambulatory Visit: Payer: Self-pay | Admitting: Urology

## 2015-07-29 MED ORDER — DEXTROSE 5 % IV SOLN
5.0000 mg/kg | INTRAVENOUS | Status: AC
Start: 2015-07-30 — End: 2015-07-30
  Administered 2015-07-30: 418.4 mg via INTRAVENOUS
  Filled 2015-07-29: qty 10.5

## 2015-07-29 NOTE — H&P (Signed)
Chief Complaint Prostate Cancer   Reason For Visit Reason for consult: To discuss treatment options for prostate cancer and specifically to consider a robotic prostatectomy.  Physician requesting consult: Dr. [name]  PCP: [place]   History of Present Illness [name] is a 55 year old gentleman who was noted to have an elevated PSA of 11.20 prompting a TRUS biopsy of the prostate on 05/16/15 that confirmed Gleason 4+5=9 adenocarcinoma of the prostate with 7 out of 12 biopsy cores positive for malignancy. He has no family history of prostate cancer. He has not yet undergone any staging studies for his high-risk disease.    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     His medical history is also significant for a history of alcohol induced liver failure. He has been sober for 4 years. He is followed by Dr. Clarene Essex. He has undergone a prior appendectomy.    TNM stage: cT2a Nx Mx  PSA: 11.20  Gleason score: 4+5=9  Biopsy (05/16/15): 7/12 cores positive    Left: L mid (10%, 3+4=7)    Right: R apex (30%, 3+4=7), R lateral apex (90%, 4+3=7), R mid (80%, 3+4=7), R lateral mid (80%, 4+3=7), R base (50%, 4+5=9), R lateral base (50%, 4+5=9, PNI)  Prostate volume: 14.9 cc    Nomogram  OC disease: 15%  EPE: 82%  SVI: 29%  LNI: 28%  PFS (surgery): 33% at 7 years, 20% at 10 years    Urinary function: He does have lower urinary tract symptoms including a sense of incomplete emptying, frequency, urgency, and nocturia. He feels that the symptoms have been worse recently and he was treated for an E. coli urinary tract infection that was resistant to fluoroquinolones in late January. He persists in having similar symptoms including dysuria. IPSS today is 12.  Erectile  function: He does have erectile dysfunction. SHIM score is 20. He previously been treated by Dr. Gaynelle Arabian with testosterone replacement therapy and had taken sildenafil and even tried prostaglandin injections. Since he quit drinking, his erectile function has improved. He now can achieve erections adequate for intercourse without medical therapy. His SHIM score is reflective of his current function.     Past Medical History Problems  1. History of Alcoholism 2. History of Anxiety (F41.9) 3. History of Cirrhosis (K74.60) 4. History of depression (Z86.59) 5. 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 History of Hyperthyroidism (E05.90) 7. History of Neck fracture (S12.9XXA) 8. 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 History of Appendectomy 2. History of Arthrodesis Cervical 3. History of Back Surgery 4. History of Dental Surgery 5. History of Leg Repair 6. History of Neck Surgery 7. History of Orchiectomy Left 8. History of Surgery Testis Fixation Of Contralateral Testis Right  Current Meds 1. Citalopram Hydrobromide 40 MG Oral Tablet;  Therapy: (Recorded:13Oct2015) to Recorded 2. Furosemide 40 MG Oral Tablet;  Therapy: (Recorded:13Oct2015) to Recorded 3. Levothyroxine Sodium 112 MCG Oral Tablet;  Therapy: (Recorded:13Oct2015) to Recorded 4. LORazepam TABS;  Therapy: (Recorded:13Oct2015) to Recorded 5. Oxycodone-Acetaminophen 5-325 MG Oral Tablet; TAKE 1 TABLET Every  4 hours PRN  pain;  Therapy: 20Jan2017 to (Last Rx:20Jan2017) Ordered 6. Phenytoin Sodium 100 MG CAPS;  Therapy: (Recorded:13Oct2015) to Recorded 7. Potassium Chloride Crys ER 10 MEQ Oral Tablet Extended Release;  Therapy: (Recorded:13Oct2015) to Recorded 8. Sildenafil Citrate 20  MG Oral Tablet; Take 2-5 tablets as needed;  Therapy: [phone] to (Last Rx:22Nov2016) Ordered 9. Spironolactone 50 MG Oral Tablet;  Therapy: (Recorded:13Oct2015) to  Recorded  Allergies Medication  1. No Known Drug Allergies  Family History Problems  1. Family history of Alcoholism : Father 2. Family history of Blood In Urine : Sister 3. Family history of Death In The Family Father : Father 42. Family history of Death In The Family Father   age 31 of heart disease and diabetes 5. Family history of Diabetes Mellitus : Father 6. Family history of Heart Disease : Father 46. Family history of Testicular Cancer : Father  Social History Problems    History of Alcohol Use (History)   alcoholic--  sober x 4 years   Current every day smoker (F17.200)   Marital History - Single   lives with and takes care of his mother   Occupation:   disabled   Tobacco Use   smokes 1 1/2 ppd x 24 years  Review of Systems Genitourinary, constitutional, skin, eye, otolaryngeal, hematologic/lymphatic, cardiovascular, pulmonary, endocrine, musculoskeletal, gastrointestinal, neurological and psychiatric system(s) were reviewed and pertinent findings if present are noted and are otherwise negative.  Genitourinary: urinary frequency, feelings of urinary urgency and dysuria.  Constitutional: no night sweats and no recent weight loss.  Hematologic/Lymphatic: no swollen glands.  Cardiovascular: no chest pain and no leg swelling.  Respiratory: no cough.    Vitals Vital Signs [Data Includes: Last 1 Day]  Recorded: [id number] 11:23AM  Weight: 235 lb  BMI Calculated: 33.72 BSA Calculated: 2.24 Blood Pressure: 167 / 69 Heart Rate: 75  Physical Exam Constitutional: Well nourished and well developed . No acute distress.  ENT:. The ears and nose are normal in appearance.  Neck: The appearance of the neck is normal and no neck mass is present.  Pulmonary: No respiratory distress, normal respiratory rhythm and effort and clear bilateral breath sounds.  Cardiovascular: Heart rate and rhythm are normal . No peripheral edema.  Abdomen: The abdomen is soft and  nontender. No masses are palpated. No CVA tenderness. No hernias are palpable. No hepatosplenomegaly noted.  Rectal: Rectal exam demonstrates normal sphincter tone, no tenderness and no masses. Prostate size is estimated to be 40 g. There is nodularity of the right base of the prostate. There is no definite extraprostatic extension although this 1.5 cm nodular area extends toward the lateral and base aspects of the prostate. The prostate is not tender. The left seminal vesicle is nonpalpable. The right seminal vesicle is nonpalpable. The perineum is normal on inspection.  Lymphatics: The femoral and inguinal nodes are not enlarged or tender.  Skin: Normal skin turgor, no visible rash and no visible skin lesions.  Neuro/Psych:. Mood and affect are appropriate.    Results/Data Urine [Data Includes: Last 1 Day]   [id number] COLOR YELLOW  APPEARANCE CLOUDY  SPECIFIC GRAVITY 1.025  pH 5.5  GLUCOSE NEGATIVE  BILIRUBIN NEGATIVE  KETONE NEGATIVE  BLOOD TRACE  PROTEIN NEGATIVE  NITRITE POSITIVE  LEUKOCYTE ESTERASE TRACE  SQUAMOUS EPITHELIAL/HPF 0-5 HPF WBC 10-20 WBC/HPF RBC 0-2 RBC/HPF BACTERIA MANY HPF CRYSTALS NONE SEEN HPF CASTS NONE SEEN LPF Yeast NONE SEEN HPF  Urine culture has been sent.    I have reviewed his medical records, PSA results, and pathology report. Findings are as dictated above.   Plan Bacteriuria with pyuria  1. URINE CULTURE; Status:In Progress - Specimen/Data Collected;   Done: [id number] Health Maintenance  2. UA With REFLEX; [Do Not Release]; Status:Complete;  DonePF:[id number] 10:03AM Prostate cancer  3. Follow-up Office  Follow-up  Status: Complete  Done: [id number] 4. PT/OT Referral Referral  Referral  Status: Hold For - Appointment,PreCert,Date of  Service,Physical Therapy  Requested for: [id number] 5. BONE SCAN; Status:Hold For - Appointment,PreCert,Print,Records; Requested  [id number];  6. CMP with Estimated GFR; Status:In Progress - Specimen/Data  Collected;   Done:  [id number] 7. CT-ABD/PELVIS WITH CONTRAST; Status:Hold For - Appointment,PreCert,Date of  Service,Print; Requested [id number];  8. VENIPUNCTURE; Status:Complete;   DonePF:[id number] Prostatitis, acute  9. Start: Sulfamethoxazole-Trimethoprim 800-160 MG Oral Tablet (Bactrim DS); TAKE 1  TABLET Every twelve hours 10. Follow-up Lab Urine Office  Follow-up  Status: Hold For - Appointment,Date of Service    Requested for: [id number]  Discussion/Summary 1. High-risk prostate cancer: I had a detailed discussion with Mr. Josephsen and his girlfriend today. I discussed the need to proceed with staging studies considering his high risk for potentially harboring metastatic cancer. He understands if he did have metastatic disease that he would require systemic therapy and that this would be an incurable situation. If his studies are negative, he understands that it would be appropriate to proceed with therapy of curative intent. He understands the high risk for potentially requiring multimodality therapy in the risk for relapse despite aggressive curative therapy. We reviewed options including primary surgical therapy followed by adjuvant or salvage treatment with radiation/systemic therapy if necessary in the future. We balance this against the option of primary radiation therapy with long-term androgen deprivation as an initial treatment strategy. Considering his young age and long life expectancy, he does wish to be aggressive and proceed with primary surgical treatment.   The patient was counseled about the natural history of prostate cancer and the standard treatment options that are available for prostate cancer. It was explained to him how his age and life expectancy, clinical stage, Gleason score, and PSA affect his prognosis, the decision to proceed with additional staging studies, as well as how that information influences recommended treatment strategies. We discussed the roles for active  surveillance, radiation therapy, surgical therapy, androgen deprivation, as well as ablative therapy options for the treatment of prostate cancer as appropriate to his individual cancer situation. We discussed the risks and benefits of these options with regard to their impact on cancer control and also in terms of potential adverse events, complications, and impact on quiality of life particularly related to urinary, bowel, and sexual function. The patient was encouraged to ask questions throughout the discussion today and all questions were answered to his stated satisfaction. In addition, the patient was provided with and/or directed to appropriate resources and literature for further education about prostate cancer and treatment options.   We discussed surgical therapy for prostate cancer including the different available surgical approaches. We discussed, in detail, the risks and expectations of surgery with regard to cancer control, urinary control, and erectile function as well as the expected postoperative recovery process. Additional risks of surgery including but not limited to bleeding, infection, hernia formation, nerve damage, lymphocele formation, bowel/rectal injury potentially necessitating colostomy, damage to the urinary tract resulting in urine leakage, urethral stricture, and the cardiopulmonary risks such as myocardial infarction, stroke, death, venothromboembolism, etc. were explained. The risk of open surgical conversion for robotic/laparoscopic prostatectomy was also discussed.     My current plan is as follows. He will have laboratory studies today to check his liver function tests and renal function. He will be scheduled for staging studies including a bone scan and CT scan  of the abdomen and pelvis considering his high risk disease. Assuming these studies are negative, he will be tentatively scheduled for a unilateral left nerve sparing robot-assisted laparoscopic radical  prostatectomy and bilateral pelvic lymphadenectomy.    2. Prostatitis: His urine will be cultured today. He had previously been treated with doxycycline without resolution. He therefore will be prescribed Bactrim double strength 1 po bid for 10 days and will have a follow-up urinalysis and culture to ensure that his urine has cleared prior to surgery.  A total of 65 minutes were spent in the overall care of the patient today with 55 minutes in direct face to face consultation.     Cc: Dr. [name]  [facility]    Verified Results URINE CULTURE3 [id number] 10:44AM3 Fransisco Beau SOURCE : CLEAN CATCH SPECIMEN TYPE: URINE [Jul 11, 2015 6:55AM Abigal Choung] Please notify patient that his repeat urine culture was negative - infection has cleared.  Test Name Result Flag Reference CULTURE, URINE3 Culture, Urine3   ===== COLONY COUNT: =====  NO GROWTH   FINAL REPORT: NO GROWTH  CT-PELVIS WITH CONTRAST2 [id number] 12:00AM2 Anselm Pancoast [Jul 10, 2015 3:22PM Shernell Saldierna] Please notify patient that his CT scan is ok and his bone scan looks good except for one area of uptake at the right arm/shoulder. Please let him know that we need to get some plain x-rays of his arm and shoulder and I have place order. Please confirm with Rosanne Ashing and see if he can get that done ASAP. I will notify him of the results.  Test Name Result Flag Reference CT-PELVIS WITH CONTRAST2 (Report)2   ** RADIOLOGY REPORT BY [facility] **   CLINICAL DATA: Patient with history of left testicular cancer status post lumpectomy 2010. History of prostate cancer 05/16/2015.  EXAM: CT PELVIS WITH CONTRAST  TECHNIQUE: Multidetector CT imaging of the pelvis was performed using the standard protocol following the bolus administration of intravenous contrast.  CONTRAST: 100 cc Isovue-300  COMPARISON: CT abdomen pelvis 02/21/2014.  FINDINGS: Postsurgical changes compatible  with left orchiectomy. Distal abdominal aortic atherosclerotic calcifications. No retroperitoneal or pelvic lymphadenopathy. The visualized small and large bowel is unremarkable. Prostate is unremarkable. Lumbar spine degenerative changes. 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 Lumbar spine degenerative changes.  IMPRESSION: No and distal abdominal aorta or pelvic adenopathy.  Postsurgical changes compatible with left orchidectomy.  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   Electronically Signed  By: Lovey Newcomer M.D.  On: 07/09/2015 14:24  URINE CULTURE1 [id number] 11:41AM1 [employer]rivers SOURCE : CLEAN CATCH SPECIMEN TYPE: URINE [Jul 01, 2015 5:24PM Vernell Townley] Please make sure patient's lab only visit for his repeat UA and culture is scheduled for about 2 weeks from now. If this is not done, his surgery will need to be cancelled. Very important! Thanks. I don't see that they scheduled this following his appt on Friday.  Test Name Result Flag Reference CULTURE, URINE1 Culture, Urine1   ===== COLONY COUNT: =====  >=100,000 COLONIES/ML   FINAL REPORT: ESCHERICHIA COLI   SENSITIVITY FOR: ESCHERICHIA COLI    TETRACYCLINE                           SENSITIVE          2    AMPICILLIN  RESISTANT       >=32    AMOX/CLAVULANIC                        SENSITIVE          4    AMPICILLIN/SUL                         INDETERMINATE     16    PIPERACILLIN/TAZO                      SENSITIVE        <=4    IMIPENEM                               SENSITIVE     <=0.25    CEFAZOLIN                              NR               <=4    CEFTRIAXONE                            SENSITIVE        <=1    CEFTAZIDIME                            SENSITIVE        <=1    CEFEPIME                               SENSITIVE        <=1    GENTAMICIN                             SENSITIVE        <=1    TOBRAMYCIN                             SENSITIVE        <=1     CIPROFLOXACIN                          RESISTANT        >=4    LEVOFLOXACIN                           RESISTANT        >=8    NITROFURANTOIN                         SENSITIVE       <=16    TRIMETH/SULFA                          SENSITIVE       <=20   NR=NOT REPORTABLE,SEE COMMENT ORAL THERAPY:A CEFAZOLIN MIC OF <32 PREDICTS  SUSCEPTIBILITY TO THE ORAL AGENTS CEFACLOR, CEFDINIR,CEFPODOXIME,CEFPROZIL,CEFUROXIME, CEPHALEXIN,AND LORACARBEF WHEN USED FOR THERAPY  OF UNCOMPLICATED UTIS DUE TO E.COLI,K.PNEUMOMIAE, AND P.MIRABILIS. PARENTERAL THERAPY: A CEFAZOLIN MIC OF >  8 INDICATES RESISTANCE TO PARENTERAL CEFAZOLIN. AN ALTERNATE TEST METHOD MUST BE PERFORMED TO CONFIRM SUSCEPTIBILITY TO PARENTERAL CEFAZOLIN.   END OF REPORT    1. Amended By: Raynelle Bring; Jul 01 2015 5:24 PM EST  2. Amended By: Raynelle Bring; Jul 10 2015 3:22 PM EST  3. Amended By: Raynelle Bring; Jul 11 2015 5:55 AM EST  Signatures Electronically signed by : Raynelle Bring, M.D.; Jul 11 2015  6:55AM EST

## 2015-07-30 ENCOUNTER — Inpatient Hospital Stay (HOSPITAL_COMMUNITY): Payer: Medicaid Other | Admitting: Certified Registered Nurse Anesthetist

## 2015-07-30 ENCOUNTER — Encounter (HOSPITAL_COMMUNITY)
Admission: RE | Disposition: A | Discharge status: Home / Self Care | Payer: Self-pay | Source: Ambulatory Visit | Attending: Urology

## 2015-07-30 ENCOUNTER — Inpatient Hospital Stay (HOSPITAL_COMMUNITY)
Admission: RE | Admit: 2015-07-30 | Discharge: 2015-07-31 | DRG: 708 | Disposition: A | Discharge status: Home / Self Care | Payer: Medicaid Other | Source: Ambulatory Visit | Attending: Urology | Admitting: Urology

## 2015-07-30 ENCOUNTER — Encounter (HOSPITAL_COMMUNITY): Payer: Self-pay | Admitting: *Deleted

## 2015-07-30 DIAGNOSIS — E291 Testicular hypofunction: Secondary | ICD-10-CM | POA: Diagnosis present

## 2015-07-30 DIAGNOSIS — Z833 Family history of diabetes mellitus: Secondary | ICD-10-CM

## 2015-07-30 DIAGNOSIS — F524 Premature ejaculation: Secondary | ICD-10-CM | POA: Diagnosis present

## 2015-07-30 DIAGNOSIS — Z8249 Family history of ischemic heart disease and other diseases of the circulatory system: Secondary | ICD-10-CM

## 2015-07-30 DIAGNOSIS — Z79899 Other long term (current) drug therapy: Secondary | ICD-10-CM | POA: Diagnosis not present

## 2015-07-30 DIAGNOSIS — Z8547 Personal history of malignant neoplasm of testis: Secondary | ICD-10-CM | POA: Diagnosis not present

## 2015-07-30 DIAGNOSIS — F17210 Nicotine dependence, cigarettes, uncomplicated: Secondary | ICD-10-CM | POA: Diagnosis present

## 2015-07-30 DIAGNOSIS — K7460 Unspecified cirrhosis of liver: Secondary | ICD-10-CM | POA: Diagnosis present

## 2015-07-30 DIAGNOSIS — Z01812 Encounter for preprocedural laboratory examination: Secondary | ICD-10-CM

## 2015-07-30 DIAGNOSIS — Z9049 Acquired absence of other specified parts of digestive tract: Secondary | ICD-10-CM

## 2015-07-30 DIAGNOSIS — C61 Malignant neoplasm of prostate: Principal | ICD-10-CM | POA: Diagnosis present

## 2015-07-30 DIAGNOSIS — Z9079 Acquired absence of other genital organ(s): Secondary | ICD-10-CM | POA: Diagnosis not present

## 2015-07-30 DIAGNOSIS — Z811 Family history of alcohol abuse and dependence: Secondary | ICD-10-CM | POA: Diagnosis not present

## 2015-07-30 DIAGNOSIS — N529 Male erectile dysfunction, unspecified: Secondary | ICD-10-CM | POA: Diagnosis present

## 2015-07-30 DIAGNOSIS — Z981 Arthrodesis status: Secondary | ICD-10-CM

## 2015-07-30 HISTORY — PX: LYMPHADENECTOMY: SHX5960

## 2015-07-30 HISTORY — PX: ROBOT ASSISTED LAPAROSCOPIC RADICAL PROSTATECTOMY: SHX5141

## 2015-07-30 LAB — PROTIME-INR
INR: 1.04 (ref 0.00–1.49)
PROTHROMBIN TIME: 13.8 s (ref 11.6–15.2)

## 2015-07-30 LAB — HEMOGLOBIN AND HEMATOCRIT, BLOOD
HCT: 35.2 % — ABNORMAL LOW (ref 39.0–52.0)
Hemoglobin: 12 g/dL — ABNORMAL LOW (ref 13.0–17.0)

## 2015-07-30 SURGERY — XI ROBOTIC ASSISTED LAPAROSCOPIC RADICAL PROSTATECTOMY LEVEL 2
Anesthesia: General

## 2015-07-30 MED ORDER — FENTANYL CITRATE (PF) 100 MCG/2ML IJ SOLN
INTRAMUSCULAR | Status: AC
  Filled 2015-07-30: qty 2

## 2015-07-30 MED ORDER — METOCLOPRAMIDE HCL 5 MG/ML IJ SOLN
INTRAMUSCULAR | Status: AC
  Filled 2015-07-30: qty 2

## 2015-07-30 MED ORDER — HYDROMORPHONE HCL 2 MG/ML IJ SOLN
INTRAMUSCULAR | Status: AC
  Filled 2015-07-30: qty 1

## 2015-07-30 MED ORDER — SUGAMMADEX SODIUM 200 MG/2ML IV SOLN
INTRAVENOUS | Status: AC
  Filled 2015-07-30: qty 2

## 2015-07-30 MED ORDER — DIPHENHYDRAMINE HCL 12.5 MG/5ML PO ELIX: 12.5000 mg | ORAL_SOLUTION | Freq: Four times a day (QID) | ORAL | Status: DC | PRN

## 2015-07-30 MED ORDER — ROCURONIUM BROMIDE 100 MG/10ML IV SOLN
INTRAVENOUS | Status: AC
  Filled 2015-07-30: qty 1

## 2015-07-30 MED ORDER — LEVOTHYROXINE SODIUM 112 MCG PO TABS
112.0000 ug | ORAL_TABLET | Freq: Every day | ORAL | Status: DC
  Administered 2015-07-31: 112 ug via ORAL
  Filled 2015-07-30: qty 1

## 2015-07-30 MED ORDER — FENTANYL CITRATE (PF) 100 MCG/2ML IJ SOLN
INTRAMUSCULAR | Status: DC | PRN
  Administered 2015-07-30 (×9): 50 ug via INTRAVENOUS

## 2015-07-30 MED ORDER — PROMETHAZINE HCL 25 MG/ML IJ SOLN
INTRAMUSCULAR | Status: AC
  Filled 2015-07-30: qty 1

## 2015-07-30 MED ORDER — RIFAXIMIN 550 MG PO TABS
550.0000 mg | ORAL_TABLET | Freq: Two times a day (BID) | ORAL | Status: DC
  Administered 2015-07-30 – 2015-07-31 (×2): 550 mg via ORAL
  Filled 2015-07-30 (×3): qty 1

## 2015-07-30 MED ORDER — LACTATED RINGERS IV SOLN: INTRAVENOUS | Status: DC

## 2015-07-30 MED ORDER — CEFAZOLIN SODIUM-DEXTROSE 2-3 GM-% IV SOLR
INTRAVENOUS | Status: AC
  Filled 2015-07-30: qty 50

## 2015-07-30 MED ORDER — KCL IN DEXTROSE-NACL 20-5-0.45 MEQ/L-%-% IV SOLN
INTRAVENOUS | Status: DC
  Administered 2015-07-30 (×2): via INTRAVENOUS
  Filled 2015-07-30 (×4): qty 1000

## 2015-07-30 MED ORDER — MIDAZOLAM HCL 5 MG/5ML IJ SOLN
INTRAMUSCULAR | Status: DC | PRN
  Administered 2015-07-30: 2 mg via INTRAVENOUS

## 2015-07-30 MED ORDER — PROPOFOL 10 MG/ML IV BOLUS
INTRAVENOUS | Status: AC
  Filled 2015-07-30: qty 20

## 2015-07-30 MED ORDER — LACTATED RINGERS IV SOLN
INTRAVENOUS | Status: DC | PRN
  Administered 2015-07-30: 11:00:00

## 2015-07-30 MED ORDER — SODIUM CHLORIDE 0.9 % IV BOLUS (SEPSIS)
1000.0000 mL | Freq: Once | INTRAVENOUS | Status: AC
  Administered 2015-07-30: 1000 mL via INTRAVENOUS

## 2015-07-30 MED ORDER — ONDANSETRON HCL 4 MG/2ML IJ SOLN
INTRAMUSCULAR | Status: DC | PRN
  Administered 2015-07-30: 4 mg via INTRAVENOUS

## 2015-07-30 MED ORDER — GABAPENTIN 300 MG PO CAPS: 300.0000 mg | ORAL_CAPSULE | Freq: Three times a day (TID) | ORAL | Status: DC

## 2015-07-30 MED ORDER — HYDROMORPHONE HCL 1 MG/ML IJ SOLN
INTRAMUSCULAR | Status: AC
  Filled 2015-07-30: qty 1

## 2015-07-30 MED ORDER — HYDRALAZINE HCL 20 MG/ML IJ SOLN
INTRAMUSCULAR | Status: AC
  Filled 2015-07-30: qty 1

## 2015-07-30 MED ORDER — ACETAMINOPHEN 325 MG PO TABS: 650.0000 mg | ORAL_TABLET | ORAL | Status: DC | PRN

## 2015-07-30 MED ORDER — LACTULOSE 10 GM/15ML PO SOLN: 20.0000 g | Freq: Three times a day (TID) | ORAL | Status: DC

## 2015-07-30 MED ORDER — SODIUM CHLORIDE 0.9 % IJ SOLN
INTRAMUSCULAR | Status: AC
  Filled 2015-07-30: qty 10

## 2015-07-30 MED ORDER — FENTANYL CITRATE (PF) 250 MCG/5ML IJ SOLN
INTRAMUSCULAR | Status: AC
Start: 2015-07-30 — End: 2015-07-30
  Filled 2015-07-30: qty 5

## 2015-07-30 MED ORDER — SODIUM CHLORIDE 0.9 % IR SOLN
Status: DC | PRN
  Administered 2015-07-30: 1000 mL

## 2015-07-30 MED ORDER — SULFAMETHOXAZOLE-TRIMETHOPRIM 800-160 MG PO TABS: 1.0000 | ORAL_TABLET | Freq: Two times a day (BID) | ORAL | Status: DC

## 2015-07-30 MED ORDER — LIDOCAINE HCL (CARDIAC) 20 MG/ML IV SOLN
INTRAVENOUS | Status: AC
  Filled 2015-07-30: qty 5

## 2015-07-30 MED ORDER — PROMETHAZINE HCL 25 MG/ML IJ SOLN
6.2500 mg | Freq: Once | INTRAMUSCULAR | Status: AC
  Administered 2015-07-30: 6.25 mg via INTRAVENOUS

## 2015-07-30 MED ORDER — BUPIVACAINE-EPINEPHRINE (PF) 0.25% -1:200000 IJ SOLN
INTRAMUSCULAR | Status: AC
  Filled 2015-07-30: qty 30

## 2015-07-30 MED ORDER — KCL IN DEXTROSE-NACL 20-5-0.45 MEQ/L-%-% IV SOLN
INTRAVENOUS | Status: AC
  Filled 2015-07-30: qty 1000

## 2015-07-30 MED ORDER — BUPIVACAINE-EPINEPHRINE 0.25% -1:200000 IJ SOLN
INTRAMUSCULAR | Status: DC | PRN
  Administered 2015-07-30: 30 mL

## 2015-07-30 MED ORDER — MORPHINE SULFATE (PF) 2 MG/ML IV SOLN
2.0000 mg | INTRAVENOUS | Status: DC | PRN
  Administered 2015-07-30: 2 mg via INTRAVENOUS
  Administered 2015-07-30: 4 mg via INTRAVENOUS
  Administered 2015-07-31 (×2): 2 mg via INTRAVENOUS
  Filled 2015-07-30: qty 1
  Filled 2015-07-30: qty 2
  Filled 2015-07-30 (×2): qty 1

## 2015-07-30 MED ORDER — CEFAZOLIN SODIUM 1-5 GM-% IV SOLN
1.0000 g | Freq: Three times a day (TID) | INTRAVENOUS | Status: AC
  Administered 2015-07-31: 1 g via INTRAVENOUS
  Filled 2015-07-30 (×2): qty 50

## 2015-07-30 MED ORDER — LIDOCAINE HCL (CARDIAC) 20 MG/ML IV SOLN
INTRAVENOUS | Status: DC | PRN
  Administered 2015-07-30: 50 mg via INTRAVENOUS

## 2015-07-30 MED ORDER — HYDROMORPHONE HCL 1 MG/ML IJ SOLN
0.2500 mg | INTRAMUSCULAR | Status: DC | PRN
  Administered 2015-07-30: 0.5 mg via INTRAVENOUS

## 2015-07-30 MED ORDER — MIDAZOLAM HCL 2 MG/2ML IJ SOLN
INTRAMUSCULAR | Status: AC
  Filled 2015-07-30: qty 2

## 2015-07-30 MED ORDER — KETOROLAC TROMETHAMINE 15 MG/ML IJ SOLN
15.0000 mg | Freq: Four times a day (QID) | INTRAMUSCULAR | Status: DC
  Administered 2015-07-31 (×2): 15 mg via INTRAVENOUS
  Filled 2015-07-30 (×2): qty 1

## 2015-07-30 MED ORDER — ONDANSETRON HCL 4 MG/2ML IJ SOLN
INTRAMUSCULAR | Status: AC
  Filled 2015-07-30: qty 2

## 2015-07-30 MED ORDER — MAGNESIUM CITRATE PO SOLN
1.0000 | Freq: Once | ORAL | Status: DC
  Filled 2015-07-30: qty 296

## 2015-07-30 MED ORDER — STERILE WATER FOR IRRIGATION IR SOLN
Status: DC | PRN
  Administered 2015-07-30: 1000 mL

## 2015-07-30 MED ORDER — HYDROMORPHONE HCL 1 MG/ML IJ SOLN
INTRAMUSCULAR | Status: DC | PRN
  Administered 2015-07-30: 0.5 mg via INTRAVENOUS
  Administered 2015-07-30: 1 mg via INTRAVENOUS
  Administered 2015-07-30: 0.5 mg via INTRAVENOUS

## 2015-07-30 MED ORDER — SPIRONOLACTONE 25 MG PO TABS
50.0000 mg | ORAL_TABLET | Freq: Two times a day (BID) | ORAL | Status: DC
  Administered 2015-07-30 – 2015-07-31 (×2): 50 mg via ORAL
  Filled 2015-07-30 (×2): qty 2

## 2015-07-30 MED ORDER — DEXTROSE 5 % IV SOLN
2.0000 g | INTRAVENOUS | Status: AC
  Administered 2015-07-30: 2 g via INTRAVENOUS
  Filled 2015-07-30: qty 20

## 2015-07-30 MED ORDER — CITALOPRAM HYDROBROMIDE 20 MG PO TABS
40.0000 mg | ORAL_TABLET | Freq: Every day | ORAL | Status: DC
Start: 2015-07-30 — End: 2015-07-31
  Administered 2015-07-30: 40 mg via ORAL
  Filled 2015-07-30: qty 2

## 2015-07-30 MED ORDER — DEXAMETHASONE SODIUM PHOSPHATE 10 MG/ML IJ SOLN
INTRAMUSCULAR | Status: AC
  Filled 2015-07-30: qty 1

## 2015-07-30 MED ORDER — LORAZEPAM 1 MG PO TABS
2.0000 mg | ORAL_TABLET | Freq: Three times a day (TID) | ORAL | Status: DC
  Administered 2015-07-30 – 2015-07-31 (×2): 2 mg via ORAL
  Filled 2015-07-30 (×2): qty 2

## 2015-07-30 MED ORDER — ARIPIPRAZOLE 2 MG PO TABS
2.0000 mg | ORAL_TABLET | Freq: Every day | ORAL | Status: DC
  Administered 2015-07-30: 2 mg via ORAL
  Filled 2015-07-30 (×2): qty 1

## 2015-07-30 MED ORDER — FUROSEMIDE 40 MG PO TABS
40.0000 mg | ORAL_TABLET | Freq: Every day | ORAL | Status: DC
  Administered 2015-07-30 – 2015-07-31 (×2): 40 mg via ORAL
  Filled 2015-07-30 (×2): qty 1

## 2015-07-30 MED ORDER — HEPARIN SODIUM (PORCINE) 1000 UNIT/ML IJ SOLN
INTRAMUSCULAR | Status: AC
  Filled 2015-07-30: qty 1

## 2015-07-30 MED ORDER — LACTATED RINGERS IV SOLN
INTRAVENOUS | Status: DC | PRN
  Administered 2015-07-30 (×3): via INTRAVENOUS

## 2015-07-30 MED ORDER — DEXAMETHASONE SODIUM PHOSPHATE 4 MG/ML IJ SOLN
INTRAMUSCULAR | Status: DC | PRN
  Administered 2015-07-30: 10 mg via INTRAVENOUS

## 2015-07-30 MED ORDER — SUGAMMADEX SODIUM 200 MG/2ML IV SOLN
INTRAVENOUS | Status: DC | PRN
  Administered 2015-07-30: 200 mg via INTRAVENOUS

## 2015-07-30 MED ORDER — ROCURONIUM BROMIDE 100 MG/10ML IV SOLN
INTRAVENOUS | Status: DC | PRN
  Administered 2015-07-30: 70 mg via INTRAVENOUS
  Administered 2015-07-30 (×2): 20 mg via INTRAVENOUS
  Administered 2015-07-30: 10 mg via INTRAVENOUS

## 2015-07-30 MED ORDER — OXYCODONE HCL 5 MG PO TABS: 5.0000 mg | ORAL_TABLET | ORAL | Status: DC | PRN

## 2015-07-30 MED ORDER — FLEET ENEMA 7-19 GM/118ML RE ENEM: 1.0000 | ENEMA | Freq: Once | RECTAL | Status: DC

## 2015-07-30 MED ORDER — DOCUSATE SODIUM 100 MG PO CAPS
100.0000 mg | ORAL_CAPSULE | Freq: Two times a day (BID) | ORAL | Status: DC
  Administered 2015-07-30 – 2015-07-31 (×2): 100 mg via ORAL
  Filled 2015-07-30 (×2): qty 1

## 2015-07-30 MED ORDER — PROPOFOL 10 MG/ML IV BOLUS
INTRAVENOUS | Status: DC | PRN
  Administered 2015-07-30: 200 mg via INTRAVENOUS

## 2015-07-30 MED ORDER — DIPHENHYDRAMINE HCL 50 MG/ML IJ SOLN: 12.5000 mg | Freq: Four times a day (QID) | INTRAMUSCULAR | Status: DC | PRN

## 2015-07-30 SURGICAL SUPPLY — 52 items
APPLICATOR COTTON TIP 6IN STRL (MISCELLANEOUS) ×4 IMPLANT
CATH FOLEY 2WAY SLVR 18FR 30CC (CATHETERS) ×4 IMPLANT
CATH ROBINSON RED A/P 16FR (CATHETERS) ×4 IMPLANT
CATH ROBINSON RED A/P 8FR (CATHETERS) ×4 IMPLANT
CATH TIEMANN FOLEY 18FR 5CC (CATHETERS) ×4 IMPLANT
CHLORAPREP W/TINT 26ML (MISCELLANEOUS) ×4 IMPLANT
CLIP LIGATING HEM O LOK PURPLE (MISCELLANEOUS) ×24 IMPLANT
COVER SURGICAL LIGHT HANDLE (MISCELLANEOUS) ×4 IMPLANT
COVER TIP SHEARS 8 DVNC (MISCELLANEOUS) ×2 IMPLANT
COVER TIP SHEARS 8MM DA VINCI (MISCELLANEOUS) ×2
CUTTER ECHEON FLEX ENDO 45 340 (ENDOMECHANICALS) ×4 IMPLANT
DECANTER SPIKE VIAL GLASS SM (MISCELLANEOUS) ×4 IMPLANT
DRAPE ARM DVNC X/XI (DISPOSABLE) ×8 IMPLANT
DRAPE COLUMN DVNC XI (DISPOSABLE) ×2 IMPLANT
DRAPE DA VINCI XI ARM (DISPOSABLE) ×8
DRAPE DA VINCI XI COLUMN (DISPOSABLE) ×2
DRAPE SURG IRRIG POUCH 19X23 (DRAPES) ×4 IMPLANT
DRSG TEGADERM 4X4.75 (GAUZE/BANDAGES/DRESSINGS) ×4 IMPLANT
ELECT REM PT RETURN 9FT ADLT (ELECTROSURGICAL) ×4
ELECTRODE REM PT RTRN 9FT ADLT (ELECTROSURGICAL) ×2 IMPLANT
GLOVE BIO SURGEON STRL SZ 6.5 (GLOVE) ×3 IMPLANT
GLOVE BIO SURGEONS STRL SZ 6.5 (GLOVE) ×1
GLOVE BIOGEL M STRL SZ7.5 (GLOVE) ×8 IMPLANT
GOWN STRL REUS W/TWL LRG LVL3 (GOWN DISPOSABLE) ×12 IMPLANT
HEMOSTAT SURGICEL 2X3 (HEMOSTASIS) ×8 IMPLANT
HOLDER FOLEY CATH W/STRAP (MISCELLANEOUS) ×4 IMPLANT
IV LACTATED RINGERS 1000ML (IV SOLUTION) IMPLANT
LIQUID BAND (GAUZE/BANDAGES/DRESSINGS) ×4 IMPLANT
NDL SAFETY ECLIPSE 18X1.5 (NEEDLE) ×2 IMPLANT
NEEDLE HYPO 18GX1.5 SHARP (NEEDLE) ×2
PACK ROBOT UROLOGY CUSTOM (CUSTOM PROCEDURE TRAY) ×4 IMPLANT
RELOAD GREEN ECHELON 45 (STAPLE) ×4 IMPLANT
SEAL CANN UNIV 5-8 DVNC XI (MISCELLANEOUS) ×8 IMPLANT
SEAL XI 5MM-8MM UNIVERSAL (MISCELLANEOUS) ×8
SET TUBE IRRIG SUCTION NO TIP (IRRIGATION / IRRIGATOR) ×4 IMPLANT
SOLUTION ELECTROLUBE (MISCELLANEOUS) ×4 IMPLANT
SUT ETHILON 3 0 PS 1 (SUTURE) ×4 IMPLANT
SUT MNCRL 3 0 RB1 (SUTURE) ×2 IMPLANT
SUT MNCRL 3 0 VIOLET RB1 (SUTURE) ×4 IMPLANT
SUT MNCRL AB 4-0 PS2 18 (SUTURE) ×8 IMPLANT
SUT MONOCRYL 3 0 RB1 (SUTURE) ×6
SUT VIC AB 0 CT1 27 (SUTURE) ×2
SUT VIC AB 0 CT1 27XBRD ANTBC (SUTURE) ×2 IMPLANT
SUT VIC AB 0 UR5 27 (SUTURE) ×4 IMPLANT
SUT VIC AB 2-0 SH 27 (SUTURE) ×2
SUT VIC AB 2-0 SH 27X BRD (SUTURE) ×2 IMPLANT
SUT VICRYL 0 UR6 27IN ABS (SUTURE) ×8 IMPLANT
SYR 27GX1/2 1ML LL SAFETY (SYRINGE) ×4 IMPLANT
TOWEL OR 17X26 10 PK STRL BLUE (TOWEL DISPOSABLE) ×4 IMPLANT
TOWEL OR NON WOVEN STRL DISP B (DISPOSABLE) ×4 IMPLANT
TUBING INSUFFLATION 10FT LAP (TUBING) IMPLANT
WATER STERILE IRR 1500ML POUR (IV SOLUTION) IMPLANT

## 2015-07-30 NOTE — Interval H&P Note (Signed)
History and Physical Interval Note:  07/30/2015 9:44 AM  [name]  has presented today for surgery, with the diagnosis of PROSTATE CANCER  The various methods of treatment have been discussed with the patient and family. After consideration of risks, benefits and other options for treatment, the patient has consented to  Procedure(s): XI ROBOTIC ASSISTED LAPAROSCOPIC RADICAL PROSTATECTOMY LEVEL 2 (N/A) BILATERAL PELVIC LYMPHADENECTOMY (Bilateral) as a surgical intervention .  The patient's history has been reviewed, patient examined, no change in status, stable for surgery.  Staging studies did not indicate obvious metastatic disease.  I have reviewed the patient's chart and labs.  Questions were answered to the patient's satisfaction.     Juandavid Dallman,LES

## 2015-07-30 NOTE — Progress Notes (Signed)
Patient ID: [name], male   DOB: 1961-03-29, 55 y.o.   MRN: [id number]  Post-op note  Subjective: The patient is doing well.  No complaints.  Still somewhat sedated from pain medication.  Objective: Vital signs in last 24 hours: Temp:  [97.5 F (36.4 C)-97.9 F (36.6 C)] 97.9 F (36.6 C) (04/03 1545) Pulse Rate:  [57-72] 60 (04/03 1645) Resp:  [13-19] 16 (04/03 1645) BP: (142-191)/(67-114) 168/79 mmHg (04/03 1645) SpO2:  [92 %-100 %] 100 % (04/03 1645) Weight:  [99.791 kg (220 lb)] 99.791 kg (220 lb) (04/03 0931)  Intake/Output from previous day:   Intake/Output this shift: Total I/O In: 3250 [I.V.:2250; IV Piggyback:1000] Out: 700 [Blood:700]  Physical Exam:  General: Alert and oriented. Abdomen: Soft, Nondistended. Incisions: Clean and dry.  Lab Results:  Recent Labs  07/30/15 1514  HGB 12.0*  HCT 35.2*    Assessment/Plan: POD#0   1) Continue to monitor, ambulate, IS   Pryor Curia. MD   LOS: 0 days   Yee Joss,LES 07/30/2015, 5:13 PM

## 2015-07-30 NOTE — Anesthesia Preprocedure Evaluation (Addendum)
Anesthesia Evaluation  Patient identified by MRN, date of birth, ID band Patient awake    Reviewed: Allergy & Precautions, H&P , NPO status , Patient's Chart, lab work & pertinent test results  Airway Mallampati: II  TM Distance: >3 FB Neck ROM: full    Dental  (+) Dental Advisory Given, Edentulous Upper, Edentulous Lower   Pulmonary Current Smoker,    Pulmonary exam normal breath sounds clear to auscultation       Cardiovascular Exercise Tolerance: Good negative cardio ROS Normal cardiovascular exam Rhythm:regular Rate:Normal     Neuro/Psych Seizures -, Well Controlled,  Last seizure years ago. Back pain negative neurological ROS  negative psych ROS   GI/Hepatic negative GI ROS, (+) Cirrhosis       ,   Endo/Other  negative endocrine ROSHypothyroidism   Renal/GU negative Renal ROS  negative genitourinary   Musculoskeletal   Abdominal   Peds  Hematology negative hematology ROS (+)   Anesthesia Other Findings   Reproductive/Obstetrics negative OB ROS                            Anesthesia Physical Anesthesia Plan  ASA: III  Anesthesia Plan: General   Post-op Pain Management:    Induction: Intravenous  Airway Management Planned: Oral ETT  Additional Equipment:   Intra-op Plan:   Post-operative Plan: Extubation in OR  Informed Consent: I have reviewed the patients History and Physical, chart, labs and discussed the procedure including the risks, benefits and alternatives for the proposed anesthesia with the patient or authorized representative who has indicated his/her understanding and acceptance.   Dental Advisory Given  Plan Discussed with: CRNA and Surgeon  Anesthesia Plan Comments:         Anesthesia Quick Evaluation

## 2015-07-30 NOTE — Progress Notes (Signed)
Pulse ox placed as ordered, explained to the patient reason for continuous pulse ox. Pt acknowledged understanding, SRP, RN

## 2015-07-30 NOTE — Op Note (Signed)
Preoperative diagnosis: Clinically localized adenocarcinoma of the prostate (clinical stage T2a N0 M0)  Postoperative diagnosis: Clinically localized adenocarcinoma of the prostate (clinical stage T2a N0 M0)  Procedure:  1. Robotic assisted laparoscopic radical prostatectomy (left nerve sparing) 2. Bilateral robotic assisted laparoscopic pelvic lymphadenectomy (extended)  Surgeon: Pryor Curia. M.D.  Assistant(s):  Debbrah Alar, PA-C  Resident: Dr. Jearld Adjutant  Anesthesia: General  Complications: None  EBL: 700 mL  IVF:  2000 mL crystalloid  Specimens: 1. Prostate and seminal vesicles 2. Right pelvic lymph nodes 3. Left pelvic lymph nodes  Disposition of specimens: Pathology  Drains: 1. 20 Fr coude catheter 2. # 19 Blake pelvic drain  Indication: [name] is a 55 y.o. patient with clinically localized, high risk prostate cancer.  After a thorough review of the management options for treatment of prostate cancer, he elected to proceed with surgical therapy and the above procedure(s).  We have discussed the potential benefits and risks of the procedure, side effects of the proposed treatment, the likelihood of the patient achieving the goals of the procedure, and any potential problems that might occur during the procedure or recuperation. Informed consent has been obtained.  Description of procedure:  The patient was taken to the operating room and a general anesthetic was administered. He was given preoperative antibiotics, placed in the dorsal lithotomy position, and prepped and draped in the usual sterile fashion. Next a preoperative timeout was performed. A urethral catheter was placed into the bladder and a site was selected near the umbilicus for placement of the camera port. This was placed using a standard open Hassan technique which allowed entry into the peritoneal cavity under direct vision and without difficulty. An 8 mm port was placed and a  pneumoperitoneum established. The camera was then used to inspect the abdomen and there was no evidence of any intra-abdominal injuries or other abnormalities. The remaining abdominal ports were then placed. 8 mm robotic ports were placed in the right lower quadrant, left lower quadrant, and far left lateral abdominal wall. A 5 mm port was placed in the right upper quadrant and a 12 mm port was placed in the right lateral abdominal wall for laparoscopic assistance. All ports were placed under direct vision without difficulty. The surgical cart was then docked.   Utilizing the cautery scissors, the bladder was reflected posteriorly allowing entry into the space of Retzius and identification of the endopelvic fascia and prostate. The periprostatic fat was then removed from the prostate allowing full exposure of the endopelvic fascia. The endopelvic fascia was then incised from the apex back to the base of the prostate bilaterally and the underlying levator muscle fibers were swept laterally off the prostate thereby isolating the dorsal venous complex. The dorsal vein was then stapled and divided with a 45 mm Flex Echelon stapler. Attention then turned to the bladder neck which was divided anteriorly thereby allowing entry into the bladder and exposure of the urethral catheter. The catheter balloon was deflated and the catheter was brought into the operative field and used to retract the prostate anteriorly. The posterior bladder neck was then examined and was divided allowing further dissection between the bladder and prostate posteriorly until the vasa deferentia and seminal vessels were identified. The vasa deferentia were isolated, divided, and lifted anteriorly. The seminal vesicles were dissected down to their tips with care to control the seminal vascular arterial blood supply. These structures were then lifted anteriorly and the space between Denonvillier's fascia and the  anterior rectum was developed with a  combination of sharp and blunt dissection. This isolated the vascular pedicles of the prostate.  The lateral prostatic fascia on the left side of the prostate was then sharply incised allowing release of the neurovascular bundle. The vascular pedicle of the prostate on the left side was then ligated with Weck clips between the prostate and neurovascular bundle and divided with sharp cold scissor dissection resulting in neurovascular bundle preservation. On the right side, a wide non nerve sparing dissection was performed with Weck clips used to ligate the vascular pedicle of the prostate. The neurovascular bundle on the left side was then separated off the apex of the prostate and urethra.   The urethra was then sharply transected allowing the prostate specimen to be disarticulated. The pelvis was copiously irrigated and hemostasis was ensured. There was no evidence for rectal injury.  Attention then turned to the right pelvic sidewall. The fibrofatty tissue between the genitofemoral nerve, external iliac vein, confluence of the iliac vessels, hypogastric artery, and Cooper's ligament was dissected free from the pelvic sidewall with care to preserve the obturator nerve. Weck clips were used for lymphostasis and hemostasis. An identical extended lymph node dissection procedure was performed on the contralateral side and the lymphatic packets were removed for permanent pathologic analysis.  Attention then turned to the urethral anastomosis. A 2-0 Vicryl slip knot was placed between Denonvillier's fascia, the posterior bladder neck, and the posterior urethra to reapproximate these structures. A double-armed 3-0 Monocryl suture was then used to perform a 360 running tension-free anastomosis between the bladder neck and urethra. A new urethral catheter was then placed into the bladder and irrigated. There were no blood clots within the bladder and the anastomosis appeared to be watertight. A #19 Blake drain was  then brought through the left lateral 8 mm port site and positioned appropriately within the pelvis. It was secured to the skin with a nylon suture. The surgical cart was then undocked. The right lateral 12 mm port site was closed at the fascial level with a 0 Vicryl suture placed laparoscopically. All remaining ports were then removed under direct vision. The prostate specimen was removed intact within the Endopouch retrieval bag via the periumbilical camera port site. This fascial opening was closed with two running 0 Vicryl sutures. 0.25% Marcaine was then injected into all port sites and all incisions were reapproximated at the skin level with 4-0 Monocryl subcuticular sutures and Dermabond. The patient appeared to tolerate the procedure well and without complications. The patient was able to be extubated and transferred to the recovery unit in satisfactory condition.   Pryor Curia MD

## 2015-07-30 NOTE — Progress Notes (Signed)
Lab results noted- Hgb. 12.0-Hct. 35.2- Dr. Alinda Money made aware

## 2015-07-30 NOTE — Anesthesia Procedure Notes (Signed)
Procedure Name: Intubation Date/Time: 07/30/2015 10:47 AM Performed by: Deliah Boston Pre-anesthesia Checklist: Patient identified, Emergency Drugs available, Suction available and Patient being monitored Patient Re-evaluated:Patient Re-evaluated prior to inductionOxygen Delivery Method: Circle System Utilized Preoxygenation: Pre-oxygenation with 100% oxygen Intubation Type: IV induction Ventilation: Mask ventilation without difficulty Laryngoscope Size: Mac and 4 Tube type: Oral Tube size: 8.0 mm Number of attempts: 1 Airway Equipment and Method: Oral airway Placement Confirmation: ETT inserted through vocal cords under direct vision,  positive ETCO2 and breath sounds checked- equal and bilateral Secured at: 22 cm Tube secured with: Tape Dental Injury: Teeth and Oropharynx as per pre-operative assessment

## 2015-07-30 NOTE — Transfer of Care (Signed)
Immediate Anesthesia Transfer of Care Note  Patient: [name]  Procedure(s) Performed: Procedure(s): XI ROBOTIC ASSISTED LAPAROSCOPIC RADICAL PROSTATECTOMY LEVEL 2 (N/A) BILATERAL PELVIC LYMPHADENECTOMY (Bilateral)  Patient Location: PACU  Anesthesia Type:General  Level of Consciousness: Patient easily awoken, sedated, comfortable, cooperative, following commands, responds to stimulation.   Airway & Oxygen Therapy: Patient spontaneously breathing, ventilating well, oxygen via simple oxygen mask.  Post-op Assessment: Report given to PACU RN, vital signs reviewed and stable, moving all extremities.   Post vital signs: Reviewed and stable.  Complications: No apparent anesthesia complications

## 2015-07-30 NOTE — Discharge Instructions (Signed)

## 2015-07-30 NOTE — Anesthesia Postprocedure Evaluation (Signed)
Anesthesia Post Note  Patient: [name]  Procedure(s) Performed: Procedure(s) (LRB): XI ROBOTIC ASSISTED LAPAROSCOPIC RADICAL PROSTATECTOMY LEVEL 2 (N/A) BILATERAL PELVIC LYMPHADENECTOMY (Bilateral)  Patient location during evaluation: PACU Anesthesia Type: General Level of consciousness: awake Pain management: pain level controlled Vital Signs Assessment: post-procedure vital signs reviewed and stable Respiratory status: spontaneous breathing and respiratory function stable Cardiovascular status: stable Postop Assessment: no signs of nausea or vomiting Anesthetic complications: no    Last Vitals:  Filed Vitals:   07/30/15 1530 07/30/15 1545  BP: 162/82   Pulse: 58   Temp:  36.6 C  Resp: 14     Last Pain:  Filed Vitals:   07/30/15 1546  PainSc: 0-No pain                 Karna Abed

## 2015-07-30 NOTE — Progress Notes (Signed)
Hgb. And Hct. Drawn by lab. 

## 2015-07-31 ENCOUNTER — Encounter (HOSPITAL_COMMUNITY): Payer: Self-pay | Admitting: Urology

## 2015-07-31 LAB — HEMOGLOBIN AND HEMATOCRIT, BLOOD
HCT: 33.6 % — ABNORMAL LOW (ref 39.0–52.0)
HEMOGLOBIN: 11.5 g/dL — AB (ref 13.0–17.0)

## 2015-07-31 MED ORDER — OXYCODONE HCL 5 MG PO TABS
5.0000 mg | ORAL_TABLET | ORAL | Status: DC | PRN
  Administered 2015-07-31 (×2): 5 mg via ORAL
  Filled 2015-07-31 (×2): qty 1

## 2015-07-31 MED ORDER — BISACODYL 10 MG RE SUPP
10.0000 mg | Freq: Once | RECTAL | Status: AC
  Administered 2015-07-31: 10 mg via RECTAL
  Filled 2015-07-31: qty 1

## 2015-07-31 MED ORDER — OXYCODONE HCL 5 MG PO TABS: 10.0000 mg | ORAL_TABLET | ORAL | Status: DC | PRN

## 2015-07-31 NOTE — Care Management Note (Signed)
Case Management Note  Patient Details  Name: [name] MRN: [id number] Date of Birth: 07-25-1960  Subjective/Objective:      surgery              Action/Plan: home with no needs   Expected Discharge Date:                  Expected Discharge Plan:  Home/Self Care  In-House Referral:     Discharge planning Services  CM Consult  Post Acute Care Choice:    Choice offered to:     DME Arranged:    DME Agency:     HH Arranged:    [place] Agency:     Status of Service:     Medicare Important Message Given:    Date Medicare IM Given:    Medicare IM give by:    Date Additional Medicare IM Given:    Additional Medicare Important Message give by:     If discussed at [place] of Stay Meetings, dates discussed:    Additional CommentsPurcell Mouton, RN 07/31/2015, 1:34 PM

## 2015-07-31 NOTE — Discharge Summary (Signed)
Date of admission: 07/30/2015  Date of discharge: 07/31/2015  Admission diagnosis: Prostate Cancer  Discharge diagnosis: Prostate Cancer  History and Physical: For full details, please see admission history and physical. Briefly, [name] is a 55 y.o. gentleman with localized prostate cancer.  After discussing management/treatment options, he elected to proceed with surgical treatment.  Hospital Course: RAMZEY PETROVIC was taken to the operating room on 07/30/2015 and underwent a robotic assisted laparoscopic radical prostatectomy. He tolerated this procedure well and without complications. Postoperatively, he was able to be transferred to a regular hospital room following recovery from anesthesia.  He was able to begin ambulating the night of surgery. He remained hemodynamically stable overnight.  He had excellent urine output with appropriately minimal output from his pelvic drain and his pelvic drain was removed on POD #1.  He was transitioned to oral pain medication, tolerated a clear liquid diet, and had met all discharge criteria and was able to be discharged home later on POD#1.  Laboratory values:  Recent Labs  07/30/15 1514 07/31/15 0449  HGB 12.0* 11.5*  HCT 35.2* 33.6*    Disposition: Home  Discharge instruction: He was instructed to be ambulatory but to refrain from heavy lifting, strenuous activity, or driving. He was instructed on urethral catheter care.  Discharge medications:     Medication List    TAKE these medications        ARIPiprazole 2 MG tablet  Commonly known as:  ABILIFY  Take 2 mg by mouth at bedtime.     citalopram 40 MG tablet  Commonly known as:  CELEXA  Take 40 mg by mouth at bedtime.     furosemide 40 MG tablet  Commonly known as:  LASIX  Take 40 mg by mouth daily.     gabapentin 300 MG capsule  Commonly known as:  NEURONTIN  Take 300 mg by mouth 3 (three) times daily. Patient takes only as needed per patient     lactulose 10 GM/15ML  solution  Commonly known as:  CHRONULAC  Take 20 g by mouth 3 (three) times daily. Only as needed     levothyroxine 112 MCG tablet  Commonly known as:  SYNTHROID, LEVOTHROID  Take 112 mcg by mouth every morning.     LORazepam 2 MG tablet  Commonly known as:  ATIVAN  Take 2 mg by mouth 3 (three) times daily.     oxyCODONE 5 MG immediate release tablet  Commonly known as:  ROXICODONE  Take 1 tablet (5 mg total) by mouth every 4 (four) hours as needed for severe pain. May take 2 tablets every 6 hours for pain     potassium chloride 10 MEQ CR capsule  Commonly known as:  MICRO-K  Take 10 mEq by mouth every morning.     rifaximin 550 MG Tabs tablet  Commonly known as:  XIFAXAN  Take 550 mg by mouth 2 (two) times daily.     spironolactone 50 MG tablet  Commonly known as:  ALDACTONE  Take 50 mg by mouth 2 (two) times daily.     sulfamethoxazole-trimethoprim 800-160 MG tablet  Commonly known as:  BACTRIM DS,SEPTRA DS  Take 1 tablet by mouth 2 (two) times daily. Start the day prior to foley removal appointment        Followup: He will followup in 1 week for catheter removal and to discuss his surgical pathology results.

## 2015-07-31 NOTE — Progress Notes (Signed)
1 Day Post-Op Subjective: The patient is doing well.  No nausea or vomiting. Pain is adequately controlled.  Objective: Vital signs in last 24 hours: Temp:  [97.5 F (36.4 C)-98.6 F (37 C)] 98.3 F (36.8 C) (04/04 0604) Pulse Rate:  [57-73] 66 (04/04 0604) Resp:  [13-19] 18 (04/04 0604) BP: (114-191)/(67-114) 114/71 mmHg (04/04 0604) SpO2:  [92 %-100 %] 99 % (04/04 0604) Weight:  [99.791 kg (220 lb)] 99.791 kg (220 lb) (04/03 1751)  Intake/Output from previous day: 04/03 0701 - 04/04 0700 In: 5575 [I.V.:4525; IV Piggyback:1050] Out: 3150 [Urine:2250; Drains:200; Blood:700] Intake/Output this shift:    Physical Exam:  General: Alert and oriented. CV: RRR Lungs: Clear bilaterally. GI: Soft, Nondistended. Incisions: Clean, dry, and intact Urine: Clear Extremities: Nontender, no erythema, no edema.  Lab Results:  Recent Labs  07/30/15 1514 07/31/15 0449  HGB 12.0* 11.5*  HCT 35.2* 33.6*      Assessment/Plan: POD# 1 s/p robotic prostatectomy.  1) SL IVF 2) Ambulate, Incentive spirometry 3) Transition to oral pain medication 4) Dulcolax suppository 5) D/C pelvic drain 6) Plan for likely discharge later today   Pryor Curia. MD   LOS: 1 day   Christell Faith 07/31/2015, 7:10 AM

## 2015-08-07 ENCOUNTER — Encounter (HOSPITAL_COMMUNITY): Payer: Self-pay | Admitting: *Deleted

## 2015-08-07 ENCOUNTER — Ambulatory Visit (HOSPITAL_COMMUNITY): Payer: Medicaid Other | Admitting: Anesthesiology

## 2015-08-07 ENCOUNTER — Encounter (HOSPITAL_COMMUNITY)
Admission: RE | Disposition: A | Discharge status: Home / Self Care | Payer: Self-pay | Source: Ambulatory Visit | Attending: Urology

## 2015-08-07 ENCOUNTER — Other Ambulatory Visit: Payer: Self-pay | Admitting: Urology

## 2015-08-07 ENCOUNTER — Ambulatory Visit (HOSPITAL_COMMUNITY)
Admission: RE | Admit: 2015-08-07 | Discharge: 2015-08-07 | Disposition: A | Discharge status: Home / Self Care | Payer: Medicaid Other | Source: Ambulatory Visit | Attending: Urology | Admitting: Urology

## 2015-08-07 DIAGNOSIS — F17210 Nicotine dependence, cigarettes, uncomplicated: Secondary | ICD-10-CM | POA: Diagnosis not present

## 2015-08-07 DIAGNOSIS — Z9049 Acquired absence of other specified parts of digestive tract: Secondary | ICD-10-CM | POA: Diagnosis not present

## 2015-08-07 DIAGNOSIS — T81321A Disruption or dehiscence of closure of internal operation (surgical) wound of abdominal wall muscle or fascia, initial encounter: Secondary | ICD-10-CM

## 2015-08-07 DIAGNOSIS — K7460 Unspecified cirrhosis of liver: Secondary | ICD-10-CM | POA: Insufficient documentation

## 2015-08-07 DIAGNOSIS — F329 Major depressive disorder, single episode, unspecified: Secondary | ICD-10-CM | POA: Insufficient documentation

## 2015-08-07 DIAGNOSIS — R569 Unspecified convulsions: Secondary | ICD-10-CM | POA: Insufficient documentation

## 2015-08-07 DIAGNOSIS — Z8546 Personal history of malignant neoplasm of prostate: Secondary | ICD-10-CM | POA: Insufficient documentation

## 2015-08-07 DIAGNOSIS — Y836 Removal of other organ (partial) (total) as the cause of abnormal reaction of the patient, or of later complication, without mention of misadventure at the time of the procedure: Secondary | ICD-10-CM | POA: Insufficient documentation

## 2015-08-07 DIAGNOSIS — E039 Hypothyroidism, unspecified: Secondary | ICD-10-CM | POA: Insufficient documentation

## 2015-08-07 DIAGNOSIS — T8131XA Disruption of external operation (surgical) wound, not elsewhere classified, initial encounter: Secondary | ICD-10-CM | POA: Diagnosis present

## 2015-08-07 DIAGNOSIS — Z9079 Acquired absence of other genital organ(s): Secondary | ICD-10-CM | POA: Diagnosis not present

## 2015-08-07 DIAGNOSIS — Z79899 Other long term (current) drug therapy: Secondary | ICD-10-CM | POA: Diagnosis not present

## 2015-08-07 DIAGNOSIS — Z8547 Personal history of malignant neoplasm of testis: Secondary | ICD-10-CM | POA: Diagnosis not present

## 2015-08-07 DIAGNOSIS — F419 Anxiety disorder, unspecified: Secondary | ICD-10-CM | POA: Diagnosis not present

## 2015-08-07 HISTORY — DX: Disruption of external operation (surgical) wound, not elsewhere classified, initial encounter: T81.31XA

## 2015-08-07 HISTORY — DX: Disruption or dehiscence of closure of internal operation (surgical) wound of abdominal wall muscle or fascia, initial encounter: T81.321A

## 2015-08-07 HISTORY — PX: WOUND EXPLORATION: SHX6188

## 2015-08-07 LAB — CBC
HEMATOCRIT: 32.1 % — AB (ref 39.0–52.0)
HEMOGLOBIN: 10.9 g/dL — AB (ref 13.0–17.0)
MCH: 32.2 pg (ref 26.0–34.0)
MCHC: 34 g/dL (ref 30.0–36.0)
MCV: 95 fL (ref 78.0–100.0)
Platelets: 186 10*3/uL (ref 150–400)
RBC: 3.38 MIL/uL — ABNORMAL LOW (ref 4.22–5.81)
RDW: 14.9 % (ref 11.5–15.5)
WBC: 10.5 10*3/uL (ref 4.0–10.5)

## 2015-08-07 LAB — BASIC METABOLIC PANEL
ANION GAP: 9 (ref 5–15)
BUN: 15 mg/dL (ref 6–20)
CHLORIDE: 103 mmol/L (ref 101–111)
CO2: 26 mmol/L (ref 22–32)
Calcium: 8.7 mg/dL — ABNORMAL LOW (ref 8.9–10.3)
Creatinine, Ser: 1.21 mg/dL (ref 0.61–1.24)
GFR calc Af Amer: >60 mL/min (ref >60)
GLUCOSE: 101 mg/dL — AB (ref 65–99)
POTASSIUM: 4 mmol/L (ref 3.5–5.1)
Sodium: 138 mmol/L (ref 135–145)

## 2015-08-07 SURGERY — WOUND EXPLORATION
Anesthesia: General | Site: Abdomen

## 2015-08-07 MED ORDER — MIDAZOLAM HCL 2 MG/2ML IJ SOLN
INTRAMUSCULAR | Status: AC
  Filled 2015-08-07: qty 2

## 2015-08-07 MED ORDER — ROCURONIUM BROMIDE 100 MG/10ML IV SOLN
INTRAVENOUS | Status: AC
  Filled 2015-08-07: qty 1

## 2015-08-07 MED ORDER — FENTANYL CITRATE (PF) 100 MCG/2ML IJ SOLN
INTRAMUSCULAR | Status: AC
  Filled 2015-08-07: qty 2

## 2015-08-07 MED ORDER — LIDOCAINE HCL (CARDIAC) 20 MG/ML IV SOLN
INTRAVENOUS | Status: AC
  Filled 2015-08-07: qty 5

## 2015-08-07 MED ORDER — PROPOFOL 10 MG/ML IV BOLUS
INTRAVENOUS | Status: DC | PRN
  Administered 2015-08-07: 200 mg via INTRAVENOUS

## 2015-08-07 MED ORDER — PROPOFOL 10 MG/ML IV BOLUS
INTRAVENOUS | Status: AC
  Filled 2015-08-07: qty 20

## 2015-08-07 MED ORDER — OXYCODONE HCL 5 MG PO TABS: 5.0000 mg | ORAL_TABLET | ORAL | Status: DC | PRN

## 2015-08-07 MED ORDER — EPHEDRINE SULFATE 50 MG/ML IJ SOLN
INTRAMUSCULAR | Status: DC | PRN
  Administered 2015-08-07: 10 mg via INTRAVENOUS

## 2015-08-07 MED ORDER — MIDAZOLAM HCL 5 MG/5ML IJ SOLN
INTRAMUSCULAR | Status: DC | PRN
  Administered 2015-08-07: 2 mg via INTRAVENOUS

## 2015-08-07 MED ORDER — SUCCINYLCHOLINE CHLORIDE 20 MG/ML IJ SOLN
INTRAMUSCULAR | Status: DC | PRN
  Administered 2015-08-07: 100 mg via INTRAVENOUS

## 2015-08-07 MED ORDER — BUPIVACAINE HCL (PF) 0.25 % IJ SOLN
INTRAMUSCULAR | Status: AC
  Filled 2015-08-07: qty 30

## 2015-08-07 MED ORDER — OXYCODONE HCL 5 MG PO TABS
5.0000 mg | ORAL_TABLET | Freq: Once | ORAL | Status: AC
  Administered 2015-08-07: 5 mg via ORAL
  Filled 2015-08-07: qty 1

## 2015-08-07 MED ORDER — SUGAMMADEX SODIUM 200 MG/2ML IV SOLN
INTRAVENOUS | Status: AC
  Filled 2015-08-07: qty 2

## 2015-08-07 MED ORDER — 0.9 % SODIUM CHLORIDE (POUR BTL) OPTIME
TOPICAL | Status: DC | PRN
  Administered 2015-08-07: 1000 mL

## 2015-08-07 MED ORDER — ONDANSETRON HCL 4 MG/2ML IJ SOLN
INTRAMUSCULAR | Status: DC | PRN
  Administered 2015-08-07: 4 mg via INTRAVENOUS

## 2015-08-07 MED ORDER — BUPIVACAINE HCL (PF) 0.25 % IJ SOLN
INTRAMUSCULAR | Status: DC | PRN
  Administered 2015-08-07: 20 mL

## 2015-08-07 MED ORDER — CEFAZOLIN SODIUM-DEXTROSE 2-4 GM/100ML-% IV SOLN
2.0000 g | INTRAVENOUS | Status: AC
  Administered 2015-08-07: 2 g via INTRAVENOUS

## 2015-08-07 MED ORDER — HYDROMORPHONE HCL 1 MG/ML IJ SOLN
0.2500 mg | INTRAMUSCULAR | Status: DC | PRN
  Administered 2015-08-07 (×2): 0.5 mg via INTRAVENOUS

## 2015-08-07 MED ORDER — CEFAZOLIN SODIUM-DEXTROSE 2-4 GM/100ML-% IV SOLN
INTRAVENOUS | Status: AC
  Filled 2015-08-07: qty 100

## 2015-08-07 MED ORDER — LIDOCAINE HCL (CARDIAC) 20 MG/ML IV SOLN
INTRAVENOUS | Status: DC | PRN
  Administered 2015-08-07: 100 mg via INTRAVENOUS

## 2015-08-07 MED ORDER — FENTANYL CITRATE (PF) 100 MCG/2ML IJ SOLN
INTRAMUSCULAR | Status: DC | PRN
  Administered 2015-08-07: 100 ug via INTRAVENOUS

## 2015-08-07 MED ORDER — LACTATED RINGERS IV SOLN
INTRAVENOUS | Status: DC | PRN
  Administered 2015-08-07: 13:00:00 via INTRAVENOUS

## 2015-08-07 MED ORDER — DEXAMETHASONE SODIUM PHOSPHATE 10 MG/ML IJ SOLN
INTRAMUSCULAR | Status: DC | PRN
  Administered 2015-08-07: 10 mg via INTRAVENOUS

## 2015-08-07 MED ORDER — HYDROMORPHONE HCL 1 MG/ML IJ SOLN
INTRAMUSCULAR | Status: AC
  Filled 2015-08-07: qty 1

## 2015-08-07 MED ORDER — DEXAMETHASONE SODIUM PHOSPHATE 10 MG/ML IJ SOLN
INTRAMUSCULAR | Status: AC
  Filled 2015-08-07: qty 1

## 2015-08-07 MED ORDER — ONDANSETRON HCL 4 MG/2ML IJ SOLN
INTRAMUSCULAR | Status: AC
  Filled 2015-08-07: qty 2

## 2015-08-07 SURGICAL SUPPLY — 23 items
BLADE HEX COATED 2.75 (ELECTRODE) IMPLANT
BNDG GAUZE ELAST 4 BULKY (GAUZE/BANDAGES/DRESSINGS) IMPLANT
COVER SURGICAL LIGHT HANDLE (MISCELLANEOUS) ×3 IMPLANT
ELECT REM PT RETURN 9FT ADLT (ELECTROSURGICAL) ×3
ELECTRODE REM PT RTRN 9FT ADLT (ELECTROSURGICAL) ×1 IMPLANT
GLOVE BIOGEL M STRL SZ7.5 (GLOVE) ×3 IMPLANT
GOWN STRL REUS W/TWL LRG LVL3 (GOWN DISPOSABLE) ×9 IMPLANT
KIT BASIN OR (CUSTOM PROCEDURE TRAY) ×3 IMPLANT
LIQUID BAND (GAUZE/BANDAGES/DRESSINGS) ×3 IMPLANT
NEEDLE HYPO 22GX1.5 SAFETY (NEEDLE) IMPLANT
NS IRRIG 1000ML POUR BTL (IV SOLUTION) ×3 IMPLANT
PACK GENERAL/GYN (CUSTOM PROCEDURE TRAY) ×3 IMPLANT
SUPPORT SCROTAL LG STRP (MISCELLANEOUS) IMPLANT
SUPPORTER ATHLETIC LG (MISCELLANEOUS)
SUT CHROMIC 3 0 SH 27 (SUTURE) IMPLANT
SUT MNCRL AB 4-0 PS2 18 (SUTURE) ×3 IMPLANT
SUT NOVA 0 T19/GS 22DT (SUTURE) ×6 IMPLANT
SUT VIC AB 2-0 UR5 27 (SUTURE) IMPLANT
SUT VICRYL 0 TIES 12 18 (SUTURE) IMPLANT
SYR CONTROL 10ML LL (SYRINGE) IMPLANT
TOWEL OR 17X26 10 PK STRL BLUE (TOWEL DISPOSABLE) ×3 IMPLANT
TUBING INSUFFLATION 10FT LAP (TUBING) IMPLANT
WATER STERILE IRR 1500ML POUR (IV SOLUTION) IMPLANT

## 2015-08-07 NOTE — Anesthesia Postprocedure Evaluation (Signed)
Anesthesia Post Note  Patient: [name]  Procedure(s) Performed: Procedure(s) (LRB): EXPLORATORY LAPAROTOMY WITH WOUND CLOSURE (N/A)  Patient location during evaluation: PACU Anesthesia Type: General Level of consciousness: sedated Pain management: pain level controlled Vital Signs Assessment: post-procedure vital signs reviewed and stable Respiratory status: spontaneous breathing and respiratory function stable Cardiovascular status: stable Anesthetic complications: no    Last Vitals:  Filed Vitals:   08/07/15 1420 08/07/15 1432  BP: 119/72 120/57  Pulse:  52  Temp: 36.3 C 36.6 C  Resp:      Last Pain:  Filed Vitals:   08/07/15 1442  PainSc: 4                  Damiano Stamper DANIEL

## 2015-08-07 NOTE — Transfer of Care (Signed)
Immediate Anesthesia Transfer of Care Note  Patient: [name]  Procedure(s) Performed: Procedure(s): EXPLORATORY LAPAROTOMY WITH WOUND CLOSURE (N/A)  Patient Location: PACU  Anesthesia Type:General  Level of Consciousness: sedated  Airway & Oxygen Therapy: Patient Spontanous Breathing and Patient connected to face mask oxygen  Post-op Assessment: Report given to RN and Post -op Vital signs reviewed and stable  Post vital signs: Reviewed and stable  Last Vitals:  Filed Vitals:   08/07/15 1126  BP: 132/65  Pulse: 60  Temp: 36.7 C  Resp: 18    Complications: No apparent anesthesia complications

## 2015-08-07 NOTE — H&P (Signed)
History of Present Illness He presents today for possible voiding trial. His abdominal erythema has decreased significantly since beginning the cephalexin I prescribed last Friday. He also notes a minor decrease in his pain and nausea.     Since Saturday, he has had a almost continual drainage of serous fluid from his periumbilical wound incision. It is exacerbated by standing up and bending over. He denies overt tenderness to the area. No palpable temperature change. He is soaking through several shirts per day with an increase in total output since yesterday. His catheter has been draining well without gross hematuria. He endorses normal bowel movements and appetite. He remains afebrile.   Past Medical History Problems  1. History of Alcoholism 2. History of Anxiety (F41.9) 3. History of Cirrhosis (K74.60) 4. History of depression (Z86.59) 5. 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 History of Hyperthyroidism (E05.90) 7. History of Neck fracture (S12.9XXA) 8. 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 History of Appendectomy 2. History of Arthrodesis Cervical 3. History of Back Surgery 4. History of Dental Surgery 5. History of Leg Repair 6. History of Neck Surgery 7. History of Orchiectomy Left 8. History of Surgery Testis Fixation Of Contralateral Testis Right  Current Meds 1. Cephalexin 500 MG Oral Capsule; TAKE 1 CAPSULE 4 TIMES DAILY;  Therapy: 07Apr2017 to (Evaluate:14Apr2017)  Requested for: 07Apr2017; Last  Rx:07Apr2017 Ordered 2. Citalopram Hydrobromide 40 MG Oral Tablet;  Therapy: (Recorded:13Oct2015) to Recorded 3. Furosemide 40 MG Oral Tablet;  Therapy: (Recorded:13Oct2015) to Recorded 4. Levothyroxine Sodium 112 MCG Oral Tablet;  Therapy: (Recorded:13Oct2015) to Recorded 5. LORazepam TABS;  Therapy: (Recorded:13Oct2015) to Recorded 6. Ondansetron 4 MG Oral Tablet Dispersible; TAKE 4 MG Every 6 hours;  Therapy:  07Apr2017 to (Last Rx:07Apr2017)  Requested for: 07Apr2017 Ordered 7. Oxycodone-Acetaminophen 5-325 MG Oral Tablet (Percocet); TAKE 1 TABLET Every  4  hours PRN pain;  Therapy: 20Jan2017 to (Last Rx:20Jan2017) Ordered 8. Phenytoin Sodium 100 MG CAPS;  Therapy: (Recorded:13Oct2015) to Recorded 9. Potassium Chloride Crys ER 10 MEQ Oral Tablet Extended Release;  Therapy: (Recorded:13Oct2015) to Recorded 10. Sildenafil Citrate 20 MG Oral Tablet; Take 2-5 tablets as needed;   Therapy: [phone] to (Last Rx:22Nov2016) Ordered 11. Spironolactone 50 MG Oral Tablet;   Therapy: (Recorded:13Oct2015) to Recorded 12. TraMADol HCl - 50 MG Oral Tablet; TAKE 1 TO 2 TABLETS EVERY 4 TO 6 HOURS AS   NEEDED FOR PAIN;   Therapy: 05Apr2017 to (Evaluate:07Apr2017); Last Rx:05Apr2017 Ordered  Allergies Medication  1. No Known Drug Allergies  Family History Problems  1. Family history of Alcoholism : Father 2. Family history of Blood In Urine : Sister 3. Family history of Death In The Family Father : Father 69. Family history of Death In The Family Father   age 58 of heart disease and diabetes 5. Family history of Diabetes Mellitus : Father 6. Family history of Heart Disease : Father 38. Family history of Testicular Cancer : Father  Social History Problems  1. History of Alcohol Use (History)   alcoholic--  sober x 4 years 2. Current every day smoker (F17.200) 3. Marital History - Single   lives with and takes care of his mother 82. Occupation:   disabled 59. Tobacco Use   smokes 1 1/2 ppd x 24 years  Review of Systems Genitourinary, constitutional, skin, eye, otolaryngeal, hematologic/lymphatic, cardiovascular, pulmonary, endocrine, musculoskeletal, gastrointestinal, neurological and psychiatric system(s) were reviewed and pertinent findings if present are noted and are otherwise negative.  Genitourinary: perineal  pain.  Gastrointestinal: nausea, flank pain and abdominal pain, but no  constipation.  Constitutional: feeling tired (fatigue), but no fever and no night sweats.  Integumentary: skin rash/lesion.  Cardiovascular: no chest pain and no leg swelling.  Respiratory: no shortness of breath and no wheezing.  Musculoskeletal: back pain.    Vitals Vital Signs [Data Includes: Last 1 Day]  Recorded: 11Apr2017 10:05AM  Blood Pressure: 127 / 72 Temperature: 97.8 F Heart Rate: 63  Physical Exam Constitutional: Well nourished and well developed . No acute distress.  ENT:. The ears and nose are normal in appearance.  Neck: The appearance of the neck is normal and no neck mass is present.  Pulmonary: No respiratory distress and normal respiratory rhythm and effort.  Cardiovascular: Heart rate and rhythm are normal . No peripheral edema.  Abdomen: right flank, right lower quadrant incision site(s) healing well and periumbilical wound(s) with fluctuance. The abdomen is mildly obese, but not distended. No masses are palpated. The abdomen is not firm, not rigid and no rebound. No CVA tenderness. Bowel sounds are normal. No hernias are palpable. Serous fluid noted to be profusely draining from the periumbilical surgical incision. Fluctuance noted but easily reduced with manual expression of fluid via the incision which is otherwise well approximated.  Genitourinary: Examination of the penis demonstrates an indwelling catheter, but no discharge, no masses, no lesions and a normal meatus. The scrotum is without lesions. The right epididymis is palpably normal and non-tender. The left epididymis is palpably normal and non-tender. The right testis is non-tender and without masses. The left testis is non-tender and without masses.  Lymphatics: The femoral and inguinal nodes are not enlarged or tender.  Skin: Normal skin turgor, no visible rash and no visible skin lesions.  Neuro/Psych:. Mood and affect are appropriate.    Results/Data  Discussed:  Dr Alinda Money examined the patient today.  Medical decision making was made in conjunction with him.    Assessment Assessed  1. Prostate cancer (C61) 2. Postoperative wound dehiscence (T81.31XA)  Plan Prostate cancer  1. Fill, Pull; Status:Hold For - Appointment,Date of Service; Requested for:11Apr2017;   Serous fluid sent for analysis (creatinine).   Dr Alinda Money will take patient to OR this afternoon for exploration and likely repair of the deep abdominal wound dehiscence at periumbilical surgical incision. His catheter will remain in place till he is seen in f/u next week with Dr Alinda Money.   Discussion/Summary More than likely the lower abdominal erythema last week was from peritoneal fluid draining into subcutaneous spaces. This has since cleared as the patient remains on oral cephalexin.   Signatures Electronically signed by : Jiles Crocker, Trey Paula; Aug 07 2015 10:50AM EST

## 2015-08-07 NOTE — Discharge Instructions (Signed)

## 2015-08-07 NOTE — Anesthesia Procedure Notes (Signed)
Procedure Name: Intubation Date/Time: 08/07/2015 1:09 PM Performed by: Lind Covert Pre-anesthesia Checklist: Patient identified, Emergency Drugs available, Suction available, Patient being monitored and Timeout performed Patient Re-evaluated:Patient Re-evaluated prior to inductionOxygen Delivery Method: Circle system utilized Preoxygenation: Pre-oxygenation with 100% oxygen Intubation Type: IV induction Laryngoscope Size: Mac and 4 Tube size: 7.5 mm Number of attempts: 1 Airway Equipment and Method: Stylet Placement Confirmation: ETT inserted through vocal cords under direct vision,  positive ETCO2 and breath sounds checked- equal and bilateral Secured at: 22 cm Tube secured with: Tape Dental Injury: Teeth and Oropharynx as per pre-operative assessment

## 2015-08-07 NOTE — Anesthesia Preprocedure Evaluation (Addendum)
Anesthesia Evaluation  Patient identified by MRN, date of birth, ID band Patient awake    Reviewed: Allergy & Precautions, H&P , NPO status , Patient's Chart, lab work & pertinent test results  Airway Mallampati: II  TM Distance: >3 FB Neck ROM: full    Dental  (+) Dental Advisory Given, Edentulous Upper, Edentulous Lower   Pulmonary Current Smoker,    Pulmonary exam normal breath sounds clear to auscultation       Cardiovascular Exercise Tolerance: Good negative cardio ROS Normal cardiovascular exam Rhythm:regular Rate:Normal     Neuro/Psych Seizures -, Well Controlled,  PSYCHIATRIC DISORDERS Anxiety Depression Last seizure years ago. Back pain negative neurological ROS  negative psych ROS   GI/Hepatic negative GI ROS, (+) Cirrhosis       ,   Endo/Other  negative endocrine ROSHypothyroidism   Renal/GU negative Renal ROS  negative genitourinary   Musculoskeletal   Abdominal   Peds  Hematology negative hematology ROS (+)   Anesthesia Other Findings   Reproductive/Obstetrics negative OB ROS                            Anesthesia Physical  Anesthesia Plan  ASA: III  Anesthesia Plan: General   Post-op Pain Management:    Induction: Intravenous  Airway Management Planned: LMA and Oral ETT  Additional Equipment:   Intra-op Plan:   Post-operative Plan: Extubation in OR  Informed Consent: I have reviewed the patients History and Physical, chart, labs and discussed the procedure including the risks, benefits and alternatives for the proposed anesthesia with the patient or authorized representative who has indicated his/her understanding and acceptance.   Dental Advisory Given  Plan Discussed with: CRNA and Surgeon  Anesthesia Plan Comments:        Anesthesia Quick Evaluation

## 2015-08-07 NOTE — Op Note (Signed)
Preoperative diagnosis:  1. Wound dehiscence  Postoperative diagnosis: 1. Wound dehiscence  Procedure(s): 1. Wound exploration and closure  Surgeon: Dr. Roxy Horseman, [name]  Anesthesia: General  Complications: None  EBL: Minima  Indication: [name] is a 55 year old gentleman s/p robotic prostatectomy last week for high risk prostate cancer.  He presented today with complaints of 3 days of clear drainage from his abdominal periumbilical wound.  He gave a history of a large amount of drainage and his catheter had been draining adequately.  His evaluation was suggestive of a wound dehiscence with the fluid likely representing peritoneal fluid.  I recommended that he proceed to the OR for exploration for definitive diagnostic purposes and wound closure. I discussed the potential benefits and risks of the procedure, side effects of the proposed treatment, the likelihood of the patient achieving the goals of the procedure, and any potential problems that might occur during the procedure or recuperation. He gave informed consent.  Description of procedure:  The patient was taken to the OR and prepped and draped in the usual sterile fashion after general anesthesia was induced.  Preoperative antibiotics were administered.  A preoperative time out was performed.  The patient's periumbilical incision was intact at the skin level although some clear fluid could be expressed.  I opened his incision at the skin level with a hemostat and felt with my index finger.  I was able to appreciate an area of the fascial closure that had separated.  The vicryl fascial suture was identified and cut.  I then palpated the peritoneal surface under the anterior abdomen. No bowel or other intra-abdominal contents were adherent to the abdominal wall.  I removed any previous vicryl suture.  I then used interrupted figure of eight 0-Novofil suture to reapproximate the fascial layer.  Once the fascia was closed, I injected  0.25% bupivicaine into the subcutaneous tissue (20 cc) and then closed the skin with a 4-0 subcuticular suture.  Liquiband was applied to the skin.    The patient tolerated the procedure well and without complications.  He was able to be awakened and transferred to the PACU in stable condition.

## 2015-08-08 ENCOUNTER — Observation Stay (HOSPITAL_COMMUNITY)
Admission: EM | Admit: 2015-08-08 | Discharge: 2015-08-09 | Disposition: A | Discharge status: Home / Self Care | Payer: Medicaid Other | Attending: Urology | Admitting: Urology

## 2015-08-08 ENCOUNTER — Encounter (HOSPITAL_COMMUNITY): Payer: Self-pay | Admitting: Urology

## 2015-08-08 DIAGNOSIS — Z9049 Acquired absence of other specified parts of digestive tract: Secondary | ICD-10-CM | POA: Diagnosis not present

## 2015-08-08 DIAGNOSIS — T8189XA Other complications of procedures, not elsewhere classified, initial encounter: Principal | ICD-10-CM | POA: Insufficient documentation

## 2015-08-08 DIAGNOSIS — T8131XA Disruption of external operation (surgical) wound, not elsewhere classified, initial encounter: Secondary | ICD-10-CM

## 2015-08-08 DIAGNOSIS — F329 Major depressive disorder, single episode, unspecified: Secondary | ICD-10-CM | POA: Insufficient documentation

## 2015-08-08 DIAGNOSIS — E039 Hypothyroidism, unspecified: Secondary | ICD-10-CM | POA: Diagnosis not present

## 2015-08-08 DIAGNOSIS — Z79899 Other long term (current) drug therapy: Secondary | ICD-10-CM | POA: Diagnosis not present

## 2015-08-08 DIAGNOSIS — F17210 Nicotine dependence, cigarettes, uncomplicated: Secondary | ICD-10-CM | POA: Insufficient documentation

## 2015-08-08 DIAGNOSIS — F419 Anxiety disorder, unspecified: Secondary | ICD-10-CM | POA: Diagnosis not present

## 2015-08-08 DIAGNOSIS — Z9079 Acquired absence of other genital organ(s): Secondary | ICD-10-CM | POA: Diagnosis not present

## 2015-08-08 DIAGNOSIS — K7460 Unspecified cirrhosis of liver: Secondary | ICD-10-CM | POA: Diagnosis not present

## 2015-08-08 DIAGNOSIS — Z8546 Personal history of malignant neoplasm of prostate: Secondary | ICD-10-CM | POA: Diagnosis not present

## 2015-08-08 DIAGNOSIS — Y838 Other surgical procedures as the cause of abnormal reaction of the patient, or of later complication, without mention of misadventure at the time of the procedure: Secondary | ICD-10-CM | POA: Diagnosis not present

## 2015-08-08 DIAGNOSIS — E291 Testicular hypofunction: Secondary | ICD-10-CM | POA: Diagnosis not present

## 2015-08-08 NOTE — ED Provider Notes (Signed)
CSN: [id number]     Arrival date & time 08/08/15  2320 History   By signing my name below, I, [name], attest that this documentation has been prepared under the direction and in the presence of Justin Greek, MD.  Electronically Signed: Forrestine Cook, ED Scribe. 08/08/2015. 11:47 PM.   Chief Complaint  Patient presents with  . Post-op Problem   The history is provided by the patient. No language interpreter was used.    HPI Comments: [name] is a 55 y.o. male with a PMHx of prostate CA and thyroid disease who presents to the Emergency Department here for a post-op problem this evening. Pt states he recently underwent prostate surgery 2 weeks ago and emergency surgery yesterday to repair a "leak" to wound site. However, pt states area continues to leak clear fluid this evening. [name] also reports ongoing, constant pain to wound site-abdomen. Discomfort is exacerbated with certain positioning and pressure to area. Prescribed pain medication attempted prior to arrival without any improvement. No recent fever or chills. He denies any prior paracentesis procedures. Pt with known allergy to Motrin.  PCP: No primary care provider on file.    Past Medical History  Diagnosis Date  . Cirrhosis of liver ([place])   . Prostate CA ([place])   . [facility])     s/p left orchiectomy and xrt in 2010  . Back pain   . Dysuria   . Elevated PSA   . ED (erectile dysfunction)   . Hypogonadism, testicular   . Premature ejaculation   . Anxiety   . Alcoholism ([place])   . Depression   . Thyroid disease     hyperthyroidism  . Neck fracture ([place])   . Stenosis, cervical spine   . Pneumonia     hx of pneumonia x 2   . Hypothyroidism   . Seizure ([place])     pt states last seizure was many years ago and stopped dilantin in 2015  . Perioperative dehiscence of abdominal wound with evisceration 08/07/2015   Past Surgical History  Procedure Laterality Date  . Prostate surgery    .  Appendectomy    . Left orchiectomy    . Back surgery    . Dental surgery    . Leg surgery    . Neck surgery    . Robot assisted laparoscopic radical prostatectomy N/A 07/30/2015    Procedure: XI ROBOTIC ASSISTED LAPAROSCOPIC RADICAL PROSTATECTOMY LEVEL 2;  Surgeon: Raynelle Bring, MD;  Location: [facility] ORS;  Service: Urology;  Laterality: N/A;  . Lymphadenectomy Bilateral 07/30/2015    Procedure: BILATERAL PELVIC LYMPHADENECTOMY;  Surgeon: Raynelle Bring, MD;  Location: [facility] ORS;  Service: Urology;  Laterality: Bilateral;  . Wound exploration N/A 08/07/2015    Procedure: EXPLORATORY LAPAROTOMY WITH WOUND CLOSURE;  Surgeon: Raynelle Bring, MD;  Location: [facility] ORS;  Service: Urology;  Laterality: N/A;   Family History  Problem Relation Age of Onset  . Cancer Paternal Uncle     leukemia  . Cancer Father     testicular (per [facility] notes)   Social History  Substance Use Topics  . Smoking status: Current Every Day Smoker -- 1.50 packs/day for 36 years    Types: Cigarettes  . Smokeless tobacco: Never Used  . Alcohol Use: Yes     Comment: quit 2012     Review of Systems  Constitutional: Negative for fever and chills.  Respiratory: Negative for shortness of breath.   Cardiovascular: Negative for chest pain.  Gastrointestinal: Positive for abdominal pain. Negative for nausea and vomiting.  Skin: Positive for wound.  Neurological: Negative for headaches.  Psychiatric/Behavioral: Negative for confusion.  All other systems reviewed and are negative.     Allergies  Motrin  Home Medications   Prior to Admission medications   Medication Sig Start Date End Date Taking? Authorizing Provider  ARIPiprazole (ABILIFY) 2 MG tablet Take 2 mg by mouth at bedtime.   Yes Historical Provider, MD  citalopram (CELEXA) 40 MG tablet Take 40 mg by mouth at bedtime.    Yes Historical Provider, MD  furosemide (LASIX) 40 MG tablet Take 40 mg by mouth daily.   Yes Historical Provider, MD  gabapentin (NEURONTIN) 300  MG capsule Take 300 mg by mouth 3 (three) times daily as needed ("Leg spasms.").    Yes Historical Provider, MD  lactulose (CHRONULAC) 10 GM/15ML solution Take 20 g by mouth 3 (three) times daily as needed (constipation.).    Yes Historical Provider, MD  levothyroxine (SYNTHROID, LEVOTHROID) 112 MCG tablet Take 112 mcg by mouth every morning.   Yes Historical Provider, MD  LORazepam (ATIVAN) 2 MG tablet Take 2 mg by mouth 3 (three) times daily.   Yes Historical Provider, MD  oxyCODONE (ROXICODONE) 5 MG immediate release tablet Take 1 tablet (5 mg total) by mouth every 4 (four) hours as needed for severe pain. 08/07/15  Yes Raynelle Bring, MD  potassium chloride (MICRO-K) 10 MEQ CR capsule Take 10 mEq by mouth every morning.   Yes Historical Provider, MD  rifaximin (XIFAXAN) 550 MG TABS tablet Take 550 mg by mouth 2 (two) times daily.   Yes Historical Provider, MD  spironolactone (ALDACTONE) 50 MG tablet Take 50 mg by mouth 2 (two) times daily.   Yes Historical Provider, MD  HYDROcodone-acetaminophen (NORCO/VICODIN) 5-325 MG tablet Take 1 tablet by mouth every 6 (six) hours as needed for moderate pain or severe pain. 08/09/15   Christell Faith, MD  oxyCODONE (ROXICODONE) 5 MG immediate release tablet Take 1 tablet (5 mg total) by mouth every 4 (four) hours as needed for severe pain. May take 2 tablets every 6 hours for pain Patient not taking: Reported on 08/07/2015 07/30/15   Debbrah Alar, PA-C  sulfamethoxazole-trimethoprim (BACTRIM DS,SEPTRA DS) 800-160 MG tablet Take 1 tablet by mouth 2 (two) times daily. Start the day prior to foley removal appointment 07/30/15   Debbrah Alar, PA-C   Triage Vitals: BP 144/67 mmHg  Pulse 57  Temp(Src) 98.1 F (36.7 C) (Oral)  Resp 16  Ht 5\' 11"  (1.803 m)  Wt 224 lb 13.9 oz (102 kg)  BMI 31.38 kg/m2  SpO2 100%   Physical Exam  Constitutional: He is oriented to person, place, and time. He appears well-developed and well-nourished. No distress.  HENT:  Head:  Normocephalic and atraumatic.  Right Ear: Hearing normal.  Left Ear: Hearing normal.  Nose: Nose normal.  Mouth/Throat: Oropharynx is clear and moist and mucous membranes are normal.  Eyes: Conjunctivae and EOM are normal. Pupils are equal, round, and reactive to light.  Neck: Normal range of motion. Neck supple.  Cardiovascular: Normal rate, regular rhythm, S1 normal, S2 normal and normal heart sounds.  Exam reveals no gallop and no friction rub.   No murmur heard. Pulmonary/Chest: Effort normal and breath sounds normal. No respiratory distress. He exhibits no tenderness.  Abdominal: Soft. Normal appearance and bowel sounds are normal. He exhibits distension. There is no hepatosplenomegaly. There is tenderness. There is no rebound, no guarding, no  tenderness at McBurney's point and negative Murphy's sign. No hernia.  Diffusely tender abdomen  Midline incision superior to umbilicus with clear fluid leaking   Musculoskeletal: Normal range of motion.  Neurological: He is alert and oriented to person, place, and time. He has normal strength. No cranial nerve deficit or sensory deficit. Coordination normal. GCS eye subscore is 4. GCS verbal subscore is 5. GCS motor subscore is 6.  Skin: Skin is warm, dry and intact. No rash noted. No cyanosis.  Psychiatric: He has a normal mood and affect. His speech is normal and behavior is normal. Thought content normal.  Nursing note and vitals reviewed.   ED Course  Procedures (including critical care time)  DIAGNOSTIC STUDIES: Oxygen Saturation is 98% on RA, Normal by my interpretation.    COORDINATION OF CARE: 11:45 PM- Will order blood work, urinalysis, and imaging. Will give Dilaudid and Zofran. Discussed treatment plan with pt at bedside and pt agreed to plan.     Labs Review Labs Reviewed  CBC WITH DIFFERENTIAL/PLATELET - Abnormal; Notable for the following:    WBC 15.3 (*)    RBC 3.59 (*)    Hemoglobin 11.5 (*)    HCT 34.0 (*)    Neutro  Abs 12.8 (*)    Monocytes Absolute 1.2 (*)    All other components within normal limits  COMPREHENSIVE METABOLIC PANEL - Abnormal; Notable for the following:    Sodium 133 (*)    Chloride 100 (*)    Glucose, Bld 108 (*)    ALT 9 (*)    All other components within normal limits  URINALYSIS, ROUTINE W REFLEX MICROSCOPIC (NOT AT [facility]) - Abnormal; Notable for the following:    APPearance TURBID (*)    Hgb urine dipstick LARGE (*)    Protein, ur 30 (*)    Leukocytes, UA LARGE (*)    All other components within normal limits  URINE MICROSCOPIC-ADD ON - Abnormal; Notable for the following:    Squamous Epithelial / LPF 0-5 (*)    Bacteria, UA FEW (*)    All other components within normal limits  URINE CULTURE  AMMONIA  PROTIME-INR  LIPASE, BLOOD  I-STAT CG4 LACTIC ACID, ED    Imaging Review Ct Abdomen Pelvis W Contrast  08/09/2015  CLINICAL DATA:  Continued leakage of clear fluid from recent incision. Continued abdominal pain. No fever or chills. EXAM: CT ABDOMEN AND PELVIS WITH CONTRAST TECHNIQUE: Multidetector CT imaging of the abdomen and pelvis was performed using the standard protocol following bolus administration of intravenous contrast. CONTRAST:  136mL ISOVUE-300 IOPAMIDOL (ISOVUE-300) INJECTION 61% COMPARISON:  07/09/2015 FINDINGS: Lower chest: Mild ground-glass opacities in the central lung bases. This could be infectious or atelectatic. No suspicious nodules. No pleural effusions. Very small pericardial effusion. Hepatobiliary: Cirrhotic liver. No suspicious focal lesions. No significant interval change from 02/21/2014. Portal vein is patent. Gallbladder and bile ducts are unremarkable. Pancreas: Normal Spleen: Normal.  Incidental granuloma. Adrenals/Urinary Tract: The adrenals and kidneys are normal in appearance. There is no urinary calculus evident. There is no hydronephrosis or ureteral dilatation. Collecting systems and ureters appear unremarkable. Urinary bladder is partially  decompressed around a Foley catheter. Stomach/Bowel: The stomach, small bowel and colon are unremarkable. Appendix is absent. Vascular/Lymphatic: The abdominal aorta is normal in caliber. There is mild atherosclerotic calcification. There is no adenopathy in the abdomen or pelvis. Reproductive: Unremarkable Other: There is a small volume ascites. There is mild inflammation in the subcutaneous tissues at the umbilicus. There is  a small umbilical hernia defect at this level. Musculoskeletal: No significant skeletal lesion IMPRESSION: 1. Cirrhosis.  Small volume ascites. 2. Mild ground-glass opacities in both lung bases. This could be infectious or atelectatic. 3. Mild inflammation in the periumbilical subcutaneous tissues. Small umbilical hernia defect. The described clear fluid drainage at this site could represent leakage of ascites across the hernia defect. No evidence of communication with bowel or abscess. No hematoma. No discrete seroma. Electronically Signed   By: Andreas Newport M.D.   On: 08/09/2015 03:36   I have personally reviewed and evaluated these images and lab results as part of my medical decision-making.   EKG Interpretation None      MDM   Final diagnoses:  Dehiscence of surgical wound, initial encounter   Patient presents to the ER for evaluation of clear fluid leaking from surgical site. Patient had prostatectomy 2 weeks ago. He had a postoperative leak that was repaired 2 days ago and now is leaking again. CT scan does not show any significant intra-abdominal abnormality. Patient was administered Zosyn for possible intra-abdominal infection as well as for his urine and urology will admit patient to the hospital for further management.  I personally performed the services described in this documentation, which was scribed in my presence. The recorded information has been reviewed and is accurate.   Justin Greek, MD 08/09/15 [phone]

## 2015-08-08 NOTE — ED Notes (Signed)
Pt states that he had prostate surgery 2 weeks ago and an emergency surgery yesterday to repair a 'leaking' wound site but the area has continued to leak clear fluid today. Dr. Alinda Money performed surgery. Alert and oriented.

## 2015-08-09 ENCOUNTER — Encounter (HOSPITAL_COMMUNITY): Payer: Self-pay

## 2015-08-09 ENCOUNTER — Emergency Department (HOSPITAL_COMMUNITY): Payer: Medicaid Other

## 2015-08-09 DIAGNOSIS — T8131XA Disruption of external operation (surgical) wound, not elsewhere classified, initial encounter: Secondary | ICD-10-CM | POA: Diagnosis present

## 2015-08-09 LAB — CBC WITH DIFFERENTIAL/PLATELET
BASOS PCT: 0 %
Basophils Absolute: 0 10*3/uL (ref 0.0–0.1)
EOS ABS: 0.1 10*3/uL (ref 0.0–0.7)
EOS PCT: 0 %
HCT: 34 % — ABNORMAL LOW (ref 39.0–52.0)
Hemoglobin: 11.5 g/dL — ABNORMAL LOW (ref 13.0–17.0)
LYMPHS ABS: 1.2 10*3/uL (ref 0.7–4.0)
Lymphocytes Relative: 8 %
MCH: 32 pg (ref 26.0–34.0)
MCHC: 33.8 g/dL (ref 30.0–36.0)
MCV: 94.7 fL (ref 78.0–100.0)
MONOS PCT: 8 %
Monocytes Absolute: 1.2 10*3/uL — ABNORMAL HIGH (ref 0.1–1.0)
Neutro Abs: 12.8 10*3/uL — ABNORMAL HIGH (ref 1.7–7.7)
Neutrophils Relative %: 84 %
PLATELETS: 193 10*3/uL (ref 150–400)
RBC: 3.59 MIL/uL — ABNORMAL LOW (ref 4.22–5.81)
RDW: 14.7 % (ref 11.5–15.5)
WBC: 15.3 10*3/uL — ABNORMAL HIGH (ref 4.0–10.5)

## 2015-08-09 LAB — COMPREHENSIVE METABOLIC PANEL
ALT: 9 U/L — ABNORMAL LOW (ref 17–63)
ANION GAP: 7 (ref 5–15)
AST: 26 U/L (ref 15–41)
Albumin: 3.5 g/dL (ref 3.5–5.0)
Alkaline Phosphatase: 65 U/L (ref 38–126)
BUN: 15 mg/dL (ref 6–20)
CHLORIDE: 100 mmol/L — AB (ref 101–111)
CO2: 26 mmol/L (ref 22–32)
Calcium: 8.9 mg/dL (ref 8.9–10.3)
Creatinine, Ser: 1.2 mg/dL (ref 0.61–1.24)
GFR calc non Af Amer: >60 mL/min (ref >60)
Glucose, Bld: 108 mg/dL — ABNORMAL HIGH (ref 65–99)
POTASSIUM: 4.5 mmol/L (ref 3.5–5.1)
SODIUM: 133 mmol/L — AB (ref 135–145)
Total Bilirubin: 0.7 mg/dL (ref 0.3–1.2)
Total Protein: 7.4 g/dL (ref 6.5–8.1)

## 2015-08-09 LAB — URINALYSIS, ROUTINE W REFLEX MICROSCOPIC
Bilirubin Urine: NEGATIVE
Glucose, UA: NEGATIVE mg/dL
KETONES UR: NEGATIVE mg/dL
Nitrite: NEGATIVE
PROTEIN: 30 mg/dL — AB
Specific Gravity, Urine: 1.009 (ref 1.005–1.030)
pH: 6 (ref 5.0–8.0)

## 2015-08-09 LAB — LIPASE, BLOOD: LIPASE: 20 U/L (ref 11–51)

## 2015-08-09 LAB — I-STAT CG4 LACTIC ACID, ED: Lactic Acid, Venous: 1.5 mmol/L (ref 0.5–2.0)

## 2015-08-09 LAB — URINE MICROSCOPIC-ADD ON

## 2015-08-09 LAB — PROTIME-INR
INR: 0.95 (ref 0.00–1.49)
PROTHROMBIN TIME: 12.9 s (ref 11.6–15.2)

## 2015-08-09 LAB — AMMONIA: Ammonia: 18 umol/L (ref 9–35)

## 2015-08-09 MED ORDER — HYDROMORPHONE HCL 1 MG/ML IJ SOLN
1.0000 mg | Freq: Once | INTRAMUSCULAR | Status: AC
  Administered 2015-08-09: 1 mg via INTRAVENOUS
  Filled 2015-08-09: qty 1

## 2015-08-09 MED ORDER — SPIRONOLACTONE 25 MG PO TABS
50.0000 mg | ORAL_TABLET | Freq: Two times a day (BID) | ORAL | Status: DC
  Administered 2015-08-09: 50 mg via ORAL
  Filled 2015-08-09: qty 2

## 2015-08-09 MED ORDER — IOPAMIDOL (ISOVUE-300) INJECTION 61%
100.0000 mL | Freq: Once | INTRAVENOUS | Status: AC | PRN
  Administered 2015-08-09: 100 mL via INTRAVENOUS

## 2015-08-09 MED ORDER — CITALOPRAM HYDROBROMIDE 20 MG PO TABS: 40.0000 mg | ORAL_TABLET | Freq: Every day | ORAL | Status: DC

## 2015-08-09 MED ORDER — LEVOTHYROXINE SODIUM 112 MCG PO TABS
112.0000 ug | ORAL_TABLET | Freq: Every day | ORAL | Status: DC
  Administered 2015-08-09: 112 ug via ORAL
  Filled 2015-08-09: qty 1

## 2015-08-09 MED ORDER — HYDROCODONE-ACETAMINOPHEN 5-325 MG PO TABS: 1.0000 | ORAL_TABLET | Freq: Four times a day (QID) | ORAL | Status: DC | PRN

## 2015-08-09 MED ORDER — FUROSEMIDE 40 MG PO TABS
40.0000 mg | ORAL_TABLET | Freq: Every day | ORAL | Status: DC
  Administered 2015-08-09: 40 mg via ORAL
  Filled 2015-08-09: qty 1

## 2015-08-09 MED ORDER — HYDROCODONE-ACETAMINOPHEN 5-325 MG PO TABS
1.0000 | ORAL_TABLET | ORAL | Status: DC | PRN
Start: 2015-08-09 — End: 2015-08-09
  Administered 2015-08-09: 2 via ORAL
  Filled 2015-08-09: qty 2

## 2015-08-09 MED ORDER — DEXTROSE-NACL 5-0.45 % IV SOLN
INTRAVENOUS | Status: DC
  Administered 2015-08-09: 08:00:00 via INTRAVENOUS

## 2015-08-09 MED ORDER — HYDROMORPHONE HCL 1 MG/ML IJ SOLN
1.0000 mg | INTRAMUSCULAR | Status: DC | PRN
  Administered 2015-08-09: 1 mg via INTRAVENOUS
  Filled 2015-08-09: qty 1

## 2015-08-09 MED ORDER — SENNOSIDES-DOCUSATE SODIUM 8.6-50 MG PO TABS
2.0000 | ORAL_TABLET | Freq: Two times a day (BID) | ORAL | Status: DC
  Administered 2015-08-09: 2 via ORAL
  Filled 2015-08-09: qty 2

## 2015-08-09 MED ORDER — PIPERACILLIN-TAZOBACTAM 3.375 G IVPB
3.3750 g | Freq: Once | INTRAVENOUS | Status: AC
  Administered 2015-08-09: 3.375 g via INTRAVENOUS
  Filled 2015-08-09: qty 50

## 2015-08-09 MED ORDER — LORAZEPAM 1 MG PO TABS
2.0000 mg | ORAL_TABLET | Freq: Three times a day (TID) | ORAL | Status: DC
  Administered 2015-08-09: 2 mg via ORAL
  Filled 2015-08-09: qty 2

## 2015-08-09 MED ORDER — ONDANSETRON HCL 4 MG/2ML IJ SOLN
4.0000 mg | Freq: Once | INTRAMUSCULAR | Status: AC
  Administered 2015-08-09: 4 mg via INTRAVENOUS
  Filled 2015-08-09: qty 2

## 2015-08-09 MED ORDER — SODIUM CHLORIDE 0.9 % IV SOLN
INTRAVENOUS | Status: DC
  Administered 2015-08-09: 05:00:00 via INTRAVENOUS

## 2015-08-09 MED ORDER — GABAPENTIN 300 MG PO CAPS: 300.0000 mg | ORAL_CAPSULE | Freq: Three times a day (TID) | ORAL | Status: DC | PRN

## 2015-08-09 MED ORDER — ONDANSETRON HCL 4 MG/2ML IJ SOLN
4.0000 mg | Freq: Three times a day (TID) | INTRAMUSCULAR | Status: DC | PRN
  Administered 2015-08-09: 4 mg via INTRAVENOUS
  Filled 2015-08-09: qty 2

## 2015-08-09 NOTE — Discharge Summary (Signed)
Date of admission: 08/08/2015  Date of discharge: 08/09/2015  Admission diagnosis: Leakage from surgical wound  Discharge diagnosis: same  Secondary diagnoses: none  History and Physical: For full details, please see admission history and physical. Briefly, [name] is a 55 y.o. year old patient post op day 2 from take back for dehiscence of periumbilical wound after RALP.   Hospital Course: The patient was evaluated in the ED and admitted for observation. The patient's wound care was addressed and a plan was formulated for ostomy appliance and abdominal binder to encourage healing and keep clothes from getting soiled. Surgery was discussed but the benefits are unclear and the patient would like to avoid surgery if possible. The patient was discharged with ostomy supplies, abdominal binder, foley in place with plan to follow up in 1 week for foley removal and re-evaluation.   Laboratory values:  Recent Labs  08/07/15 1155 08/09/15 0037  HGB 10.9* 11.5*  HCT 32.1* 34.0*    Recent Labs  08/07/15 1155 08/09/15 0037  CREATININE 1.21 1.20    Disposition: Home  Discharge instruction: The patient was instructed to be ambulatory but told to refrain from heavy lifting, strenuous activity, or driving.   Discharge medications:    Medication List    TAKE these medications        ARIPiprazole 2 MG tablet  Commonly known as:  ABILIFY  Take 2 mg by mouth at bedtime.     citalopram 40 MG tablet  Commonly known as:  CELEXA  Take 40 mg by mouth at bedtime.     furosemide 40 MG tablet  Commonly known as:  LASIX  Take 40 mg by mouth daily.     gabapentin 300 MG capsule  Commonly known as:  NEURONTIN  Take 300 mg by mouth 3 (three) times daily as needed ("Leg spasms.").     HYDROcodone-acetaminophen 5-325 MG tablet  Commonly known as:  NORCO/VICODIN  Take 1 tablet by mouth every 6 (six) hours as needed for moderate pain or severe pain.     lactulose 10 GM/15ML solution   Commonly known as:  CHRONULAC  Take 20 g by mouth 3 (three) times daily as needed (constipation.).     levothyroxine 112 MCG tablet  Commonly known as:  SYNTHROID, LEVOTHROID  Take 112 mcg by mouth every morning.     LORazepam 2 MG tablet  Commonly known as:  ATIVAN  Take 2 mg by mouth 3 (three) times daily.     oxyCODONE 5 MG immediate release tablet  Commonly known as:  ROXICODONE  Take 1 tablet (5 mg total) by mouth every 4 (four) hours as needed for severe pain. May take 2 tablets every 6 hours for pain     oxyCODONE 5 MG immediate release tablet  Commonly known as:  ROXICODONE  Take 1 tablet (5 mg total) by mouth every 4 (four) hours as needed for severe pain.     potassium chloride 10 MEQ CR capsule  Commonly known as:  MICRO-K  Take 10 mEq by mouth every morning.     rifaximin 550 MG Tabs tablet  Commonly known as:  XIFAXAN  Take 550 mg by mouth 2 (two) times daily.     spironolactone 50 MG tablet  Commonly known as:  ALDACTONE  Take 50 mg by mouth 2 (two) times daily.     sulfamethoxazole-trimethoprim 800-160 MG tablet  Commonly known as:  BACTRIM DS,SEPTRA DS  Take 1 tablet by mouth 2 (two) times daily.  Start the day prior to foley removal appointment        Followup:  As scheduled

## 2015-08-09 NOTE — H&P (Signed)
H&P  Chief Complaint: drainage from surgical wound.  History of Present Illness: [name] is a 55 y.o. year old with h/o prostate cancer s/p RALP and now POD#2 from take back for dehiscence of periumbilical wound where the specimen was removed. The patient did well for about 24 hours but then started to have some leakage from the incision similar to prior. The patient came to the ED to be evaluated. The patient had no fevers at home or in the hospital. The patient had a CT scan that did not show any significant pathology. The patient has a low threshold for pain at baseline and continuously asks for pain medications, this is at baseline and unchanged. The patient reports that while he is having drainage it is significantly decreased from before his take back procedure.    Past Medical History  Diagnosis Date  . Cirrhosis of liver ([place])   . Prostate CA ([place])   . [facility])     s/p left orchiectomy and xrt in 2010  . Back pain   . Dysuria   . Elevated PSA   . ED (erectile dysfunction)   . Hypogonadism, testicular   . Premature ejaculation   . Anxiety   . Alcoholism ([place])   . Depression   . Thyroid disease     hyperthyroidism  . Neck fracture ([place])   . Stenosis, cervical spine   . Pneumonia     hx of pneumonia x 2   . Hypothyroidism   . Seizure ([place])     pt states last seizure was many years ago and stopped dilantin in 2015  . Perioperative dehiscence of abdominal wound with evisceration 08/07/2015    Past Surgical History  Procedure Laterality Date  . Prostate surgery    . Appendectomy    . Left orchiectomy    . Back surgery    . Dental surgery    . Leg surgery    . Neck surgery    . Robot assisted laparoscopic radical prostatectomy N/A 07/30/2015    Procedure: XI ROBOTIC ASSISTED LAPAROSCOPIC RADICAL PROSTATECTOMY LEVEL 2;  Surgeon: Raynelle Bring, MD;  Location: [facility];  Service: Urology;  Laterality: N/A;  . Lymphadenectomy Bilateral 07/30/2015    Procedure:  BILATERAL PELVIC LYMPHADENECTOMY;  Surgeon: Raynelle Bring, MD;  Location: [facility];  Service: Urology;  Laterality: Bilateral;  . Wound exploration N/A 08/07/2015    Procedure: EXPLORATORY LAPAROTOMY WITH WOUND CLOSURE;  Surgeon: Raynelle Bring, MD;  Location: [facility];  Service: Urology;  Laterality: N/A;    Home Medications:    Medication List    TAKE these medications        ARIPiprazole 2 MG tablet  Commonly known as:  ABILIFY  Take 2 mg by mouth at bedtime.     citalopram 40 MG tablet  Commonly known as:  CELEXA  Take 40 mg by mouth at bedtime.     furosemide 40 MG tablet  Commonly known as:  LASIX  Take 40 mg by mouth daily.     gabapentin 300 MG capsule  Commonly known as:  NEURONTIN  Take 300 mg by mouth 3 (three) times daily as needed ("Leg spasms.").     HYDROcodone-acetaminophen 5-325 MG tablet  Commonly known as:  NORCO/VICODIN  Take 1 tablet by mouth every 6 (six) hours as needed for moderate pain or severe pain.     lactulose 10 GM/15ML solution  Commonly known as:  CHRONULAC  Take 20 g by mouth 3 (three) times  daily as needed (constipation.).     levothyroxine 112 MCG tablet  Commonly known as:  SYNTHROID, LEVOTHROID  Take 112 mcg by mouth every morning.     LORazepam 2 MG tablet  Commonly known as:  ATIVAN  Take 2 mg by mouth 3 (three) times daily.     oxyCODONE 5 MG immediate release tablet  Commonly known as:  ROXICODONE  Take 1 tablet (5 mg total) by mouth every 4 (four) hours as needed for severe pain. May take 2 tablets every 6 hours for pain     oxyCODONE 5 MG immediate release tablet  Commonly known as:  ROXICODONE  Take 1 tablet (5 mg total) by mouth every 4 (four) hours as needed for severe pain.     potassium chloride 10 MEQ CR capsule  Commonly known as:  MICRO-K  Take 10 mEq by mouth every morning.     rifaximin 550 MG Tabs tablet  Commonly known as:  XIFAXAN  Take 550 mg by mouth 2 (two) times daily.     spironolactone 50 MG tablet   Commonly known as:  ALDACTONE  Take 50 mg by mouth 2 (two) times daily.     sulfamethoxazole-trimethoprim 800-160 MG tablet  Commonly known as:  BACTRIM DS,SEPTRA DS  Take 1 tablet by mouth 2 (two) times daily. Start the day prior to foley removal appointment        Allergies:  Allergies  Allergen Reactions  . Motrin [Ibuprofen]     Avoids due to liver cirrhosis.    Family History  Problem Relation Age of Onset  . Cancer Paternal Uncle     leukemia  . Cancer Father     testicular (per [facility] notes)    Social History:  reports that he has been smoking Cigarettes.  He has a 54 pack-year smoking history. He has never used smokeless tobacco. He reports that he drinks alcohol. He reports that he does not use illicit drugs.  ROS: A complete review of systems was performed.  All systems are negative except for pertinent findings as noted.  Physical Exam:  Vital signs in last 24 hours: Temp:  [98.1 F (36.7 C)-98.7 F (37.1 C)] 98.1 F (36.7 C) (04/13 0517) Pulse Rate:  [51-71] 57 (04/13 0517) Resp:  [16] 16 (04/13 0452) BP: (144-178)/(62-82) 144/67 mmHg (04/13 0517) SpO2:  [98 %-100 %] 100 % (04/13 0517) Weight:  [102 kg (224 lb 13.9 oz)] 102 kg (224 lb 13.9 oz) (04/13 0517) Constitutional: Well nourishedandwell developed. No acute distress.  ENT:. The ears and nose are normal in appearance.  Neck: The appearance of the neck is normalandno neck mass is present.  Pulmonary: No respiratory distressandnormal respiratory rhythm and effort.  Cardiovascular: Heart rate and rhythm are normal. No peripheral edema.  Abdomen: right flank, right lower quadrant incision site(s) healing well and periumbilical wound(s) with fluctuance. The abdomen is obese, butnot distended. No masses are palpated. The abdomen is not firm,not rigidandno rebound. No CVA tenderness. Bowel sounds are normal. No hernias are palpable. Serous fluid noted to be draining from the periumbilical  surgical incision. Skin incision is intact. Genitourinary: Examination of the penis demonstrates an indwelling catheter with some dependent swelling, butno discharge,no masses,no lesionsanda normal meatus. The scrotum is without lesions.  Lymphatics: The femoral and inguinal nodes are not enlarged or tender.  Skin: Normal skin turgor,no visible rashandno visible skin lesions.  Neuro/Psych:. Mood and affect are appropriate.   Laboratory Data:   Recent Labs  08/07/15 1155  08/09/15 0037  WBC 10.5 15.3*  HGB 10.9* 11.5*  HCT 32.1* 34.0*  PLT 186 193     Recent Labs  08/07/15 1155 08/09/15 0037  NA 138 133*  K 4.0 4.5  CL 103 100*  GLUCOSE 101* 108*  BUN 15 15  CALCIUM 8.7* 8.9  CREATININE 1.21 1.20     Results for orders placed or performed during the hospital encounter of 08/08/15 (from the past 24 hour(s))  Urinalysis, Routine w reflex microscopic (not at [facility])     Status: Abnormal   Collection Time: 08/09/15 12:01 AM  Result Value Ref Range   Color, Urine YELLOW YELLOW   APPearance TURBID (A) CLEAR   Specific Gravity, Urine 1.009 1.005 - 1.030   pH 6.0 5.0 - 8.0   Glucose, UA NEGATIVE NEGATIVE mg/dL   Hgb urine dipstick LARGE (A) NEGATIVE   Bilirubin Urine NEGATIVE NEGATIVE   Ketones, ur NEGATIVE NEGATIVE mg/dL   Protein, ur 30 (A) NEGATIVE mg/dL   Nitrite NEGATIVE NEGATIVE   Leukocytes, UA LARGE (A) NEGATIVE  Urine microscopic-add on     Status: Abnormal   Collection Time: 08/09/15 12:01 AM  Result Value Ref Range   Squamous Epithelial / LPF 0-5 (A) NONE SEEN   WBC, UA TOO NUMEROUS TO COUNT 0 - 5 WBC/hpf   RBC / HPF 6-30 0 - 5 RBC/hpf   Bacteria, UA FEW (A) NONE SEEN  CBC with Differential/Platelet     Status: Abnormal   Collection Time: 08/09/15 12:37 AM  Result Value Ref Range   WBC 15.3 (H) 4.0 - 10.5 K/uL   RBC 3.59 (L) 4.22 - 5.81 MIL/uL   Hemoglobin 11.5 (L) 13.0 - 17.0 g/dL   HCT 34.0 (L) 39.0 - 52.0 %   MCV 94.7 78.0 - 100.0 fL   MCH 32.0  26.0 - 34.0 pg   MCHC 33.8 30.0 - 36.0 g/dL   RDW 14.7 11.5 - 15.5 %   Platelets 193 150 - 400 K/uL   Neutrophils Relative % 84 %   Neutro Abs 12.8 (H) 1.7 - 7.7 K/uL   Lymphocytes Relative 8 %   Lymphs Abs 1.2 0.7 - 4.0 K/uL   Monocytes Relative 8 %   Monocytes Absolute 1.2 (H) 0.1 - 1.0 K/uL   Eosinophils Relative 0 %   Eosinophils Absolute 0.1 0.0 - 0.7 K/uL   Basophils Relative 0 %   Basophils Absolute 0.0 0.0 - 0.1 K/uL  Comprehensive metabolic panel     Status: Abnormal   Collection Time: 08/09/15 12:37 AM  Result Value Ref Range   Sodium 133 (L) 135 - 145 mmol/L   Potassium 4.5 3.5 - 5.1 mmol/L   Chloride 100 (L) 101 - 111 mmol/L   CO2 26 22 - 32 mmol/L   Glucose, Bld 108 (H) 65 - 99 mg/dL   BUN 15 6 - 20 mg/dL   Creatinine, Ser 1.20 0.61 - 1.24 mg/dL   Calcium 8.9 8.9 - 10.3 mg/dL   Total Protein 7.4 6.5 - 8.1 g/dL   Albumin 3.5 3.5 - 5.0 g/dL   AST 26 15 - 41 U/L   ALT 9 (L) 17 - 63 U/L   Alkaline Phosphatase 65 38 - 126 U/L   Total Bilirubin 0.7 0.3 - 1.2 mg/dL   GFR calc non Af Amer >60 >60 mL/min   GFR calc Af Amer >60 >60 mL/min   Anion gap 7 5 - 15  Protime-INR     Status: None  Collection Time: 08/09/15 12:37 AM  Result Value Ref Range   Prothrombin Time 12.9 11.6 - 15.2 seconds   INR 0.95 0.00 - 1.49  Lipase, blood     Status: None   Collection Time: 08/09/15 12:37 AM  Result Value Ref Range   Lipase 20 11 - 51 U/L  I-Stat CG4 Lactic Acid, ED     Status: None   Collection Time: 08/09/15  1:17 AM  Result Value Ref Range   Lactic Acid, Venous 1.50 0.5 - 2.0 mmol/L  Ammonia     Status: None   Collection Time: 08/09/15  1:19 AM  Result Value Ref Range   Ammonia 18 9 - 35 umol/L   No results found for this or any previous visit (from the past 240 hour(s)).  Renal Function:  Recent Labs  08/07/15 1155 08/09/15 0037  CREATININE 1.21 1.20   Estimated Creatinine Clearance: 87.6 mL/min (by C-G formula based on Cr of 1.2).  Radiologic Imaging: Ct  Abdomen Pelvis W Contrast  08/09/2015  CLINICAL DATA:  Continued leakage of clear fluid from recent incision. Continued abdominal pain. No fever or chills. EXAM: CT ABDOMEN AND PELVIS WITH CONTRAST TECHNIQUE: Multidetector CT imaging of the abdomen and pelvis was performed using the standard protocol following bolus administration of intravenous contrast. CONTRAST:  140mL ISOVUE-300 IOPAMIDOL (ISOVUE-300) INJECTION 61% COMPARISON:  07/09/2015 FINDINGS: Lower chest: Mild ground-glass opacities in the central lung bases. This could be infectious or atelectatic. No suspicious nodules. No pleural effusions. Very small pericardial effusion. Hepatobiliary: Cirrhotic liver. No suspicious focal lesions. No significant interval change from 02/21/2014. Portal vein is patent. Gallbladder and bile ducts are unremarkable. Pancreas: Normal Spleen: Normal.  Incidental granuloma. Adrenals/Urinary Tract: The adrenals and kidneys are normal in appearance. There is no urinary calculus evident. There is no hydronephrosis or ureteral dilatation. Collecting systems and ureters appear unremarkable. Urinary bladder is partially decompressed around a Foley catheter. Stomach/Bowel: The stomach, small bowel and colon are unremarkable. Appendix is absent. Vascular/Lymphatic: The abdominal aorta is normal in caliber. There is mild atherosclerotic calcification. There is no adenopathy in the abdomen or pelvis. Reproductive: Unremarkable Other: There is a small volume ascites. There is mild inflammation in the subcutaneous tissues at the umbilicus. There is a small umbilical hernia defect at this level. Musculoskeletal: No significant skeletal lesion IMPRESSION: 1. Cirrhosis.  Small volume ascites. 2. Mild ground-glass opacities in both lung bases. This could be infectious or atelectatic. 3. Mild inflammation in the periumbilical subcutaneous tissues. Small umbilical hernia defect. The described clear fluid drainage at this site could represent  leakage of ascites across the hernia defect. No evidence of communication with bowel or abscess. No hematoma. No discrete seroma. Electronically Signed   By: Andreas Newport M.D.   On: 08/09/2015 03:36    Impression/Assessment:  55 y.o. male s/p RALP on 07/30/15 and take back 08/07/15 for dehiscence of his midline wound. The patient continues to have leakage from the wound but this has significantly decreased. There is no evidence of infection.   Plan:  - Ostomy appliance for leakage - abdominal binder for home - No evidence of active infection - f/u as scheduled.  Christell Faith 08/09/2015, 8:24 AM

## 2015-08-09 NOTE — Progress Notes (Signed)
Patient discharged via wheelchair at 1140 in stable condition.  Educated pt regarding foley care, wound care and new medications.  Prescription given.  Leg bag, ostomy bags (4) given.  Abdominal binder applied.  Discharge instructions given.  Teach back completed.

## 2015-08-11 LAB — URINE CULTURE

## 2015-10-27 ENCOUNTER — Encounter (HOSPITAL_COMMUNITY): Payer: Self-pay | Admitting: Emergency Medicine

## 2015-10-27 ENCOUNTER — Emergency Department (HOSPITAL_BASED_OUTPATIENT_CLINIC_OR_DEPARTMENT_OTHER): Payer: Medicaid Other

## 2015-10-27 ENCOUNTER — Inpatient Hospital Stay (HOSPITAL_COMMUNITY)
Admission: EM | Admit: 2015-10-27 | Discharge: 2015-10-29 | DRG: 603 | Disposition: A | Discharge status: Home / Self Care | Payer: Medicaid Other | Attending: Internal Medicine | Admitting: Internal Medicine

## 2015-10-27 ENCOUNTER — Emergency Department (HOSPITAL_COMMUNITY): Payer: Medicaid Other

## 2015-10-27 DIAGNOSIS — I878 Other specified disorders of veins: Secondary | ICD-10-CM | POA: Diagnosis present

## 2015-10-27 DIAGNOSIS — F419 Anxiety disorder, unspecified: Secondary | ICD-10-CM | POA: Diagnosis present

## 2015-10-27 DIAGNOSIS — Z6831 Body mass index (BMI) 31.0-31.9, adult: Secondary | ICD-10-CM

## 2015-10-27 DIAGNOSIS — T8189XA Other complications of procedures, not elsewhere classified, initial encounter: Secondary | ICD-10-CM | POA: Diagnosis present

## 2015-10-27 DIAGNOSIS — Z885 Allergy status to narcotic agent status: Secondary | ICD-10-CM

## 2015-10-27 DIAGNOSIS — E871 Hypo-osmolality and hyponatremia: Secondary | ICD-10-CM | POA: Diagnosis present

## 2015-10-27 DIAGNOSIS — G8929 Other chronic pain: Secondary | ICD-10-CM | POA: Diagnosis present

## 2015-10-27 DIAGNOSIS — L02419 Cutaneous abscess of limb, unspecified: Secondary | ICD-10-CM | POA: Diagnosis present

## 2015-10-27 DIAGNOSIS — T8131XA Disruption of external operation (surgical) wound, not elsewhere classified, initial encounter: Secondary | ICD-10-CM | POA: Diagnosis present

## 2015-10-27 DIAGNOSIS — Z9079 Acquired absence of other genital organ(s): Secondary | ICD-10-CM

## 2015-10-27 DIAGNOSIS — F17210 Nicotine dependence, cigarettes, uncomplicated: Secondary | ICD-10-CM | POA: Diagnosis present

## 2015-10-27 DIAGNOSIS — Z87440 Personal history of urinary (tract) infections: Secondary | ICD-10-CM

## 2015-10-27 DIAGNOSIS — E669 Obesity, unspecified: Secondary | ICD-10-CM | POA: Diagnosis present

## 2015-10-27 DIAGNOSIS — Z923 Personal history of irradiation: Secondary | ICD-10-CM

## 2015-10-27 DIAGNOSIS — M79609 Pain in unspecified limb: Secondary | ICD-10-CM | POA: Diagnosis not present

## 2015-10-27 DIAGNOSIS — K7460 Unspecified cirrhosis of liver: Secondary | ICD-10-CM | POA: Diagnosis present

## 2015-10-27 DIAGNOSIS — R52 Pain, unspecified: Secondary | ICD-10-CM

## 2015-10-27 DIAGNOSIS — K429 Umbilical hernia without obstruction or gangrene: Secondary | ICD-10-CM | POA: Diagnosis present

## 2015-10-27 DIAGNOSIS — N390 Urinary tract infection, site not specified: Secondary | ICD-10-CM | POA: Diagnosis not present

## 2015-10-27 DIAGNOSIS — Z720 Tobacco use: Secondary | ICD-10-CM | POA: Diagnosis present

## 2015-10-27 DIAGNOSIS — L03115 Cellulitis of right lower limb: Secondary | ICD-10-CM | POA: Diagnosis not present

## 2015-10-27 DIAGNOSIS — L03119 Cellulitis of unspecified part of limb: Secondary | ICD-10-CM

## 2015-10-27 DIAGNOSIS — Z79899 Other long term (current) drug therapy: Secondary | ICD-10-CM

## 2015-10-27 DIAGNOSIS — I898 Other specified noninfective disorders of lymphatic vessels and lymph nodes: Secondary | ICD-10-CM | POA: Diagnosis present

## 2015-10-27 DIAGNOSIS — Z8547 Personal history of malignant neoplasm of testis: Secondary | ICD-10-CM

## 2015-10-27 DIAGNOSIS — M79604 Pain in right leg: Secondary | ICD-10-CM

## 2015-10-27 DIAGNOSIS — M7989 Other specified soft tissue disorders: Secondary | ICD-10-CM

## 2015-10-27 DIAGNOSIS — S81801A Unspecified open wound, right lower leg, initial encounter: Secondary | ICD-10-CM | POA: Diagnosis present

## 2015-10-27 DIAGNOSIS — C6292 Malignant neoplasm of left testis, unspecified whether descended or undescended: Secondary | ICD-10-CM | POA: Diagnosis present

## 2015-10-27 DIAGNOSIS — C61 Malignant neoplasm of prostate: Secondary | ICD-10-CM | POA: Diagnosis present

## 2015-10-27 DIAGNOSIS — L0390 Cellulitis, unspecified: Secondary | ICD-10-CM | POA: Diagnosis present

## 2015-10-27 DIAGNOSIS — M6208 Separation of muscle (nontraumatic), other site: Secondary | ICD-10-CM | POA: Diagnosis present

## 2015-10-27 DIAGNOSIS — Z806 Family history of leukemia: Secondary | ICD-10-CM

## 2015-10-27 DIAGNOSIS — B9620 Unspecified Escherichia coli [E. coli] as the cause of diseases classified elsewhere: Secondary | ICD-10-CM | POA: Diagnosis not present

## 2015-10-27 DIAGNOSIS — Z886 Allergy status to analgesic agent status: Secondary | ICD-10-CM

## 2015-10-27 DIAGNOSIS — Z8043 Family history of malignant neoplasm of testis: Secondary | ICD-10-CM

## 2015-10-27 DIAGNOSIS — K432 Incisional hernia without obstruction or gangrene: Secondary | ICD-10-CM | POA: Diagnosis present

## 2015-10-27 DIAGNOSIS — F1011 Alcohol abuse, in remission: Secondary | ICD-10-CM | POA: Diagnosis present

## 2015-10-27 HISTORY — DX: Urinary tract infection, site not specified: N39.0

## 2015-10-27 HISTORY — DX: Unspecified Escherichia coli (E. coli) as the cause of diseases classified elsewhere: B96.20

## 2015-10-27 HISTORY — DX: Acute upper respiratory infection, unspecified: J06.9

## 2015-10-27 LAB — CBC WITH DIFFERENTIAL/PLATELET
BASOS ABS: 0 10*3/uL (ref 0.0–0.1)
Basophils Relative: 0 %
EOS PCT: 1 %
Eosinophils Absolute: 0.1 10*3/uL (ref 0.0–0.7)
HEMATOCRIT: 32.2 % — AB (ref 39.0–52.0)
Hemoglobin: 10.8 g/dL — ABNORMAL LOW (ref 13.0–17.0)
LYMPHS PCT: 5 %
Lymphs Abs: 0.5 10*3/uL — ABNORMAL LOW (ref 0.7–4.0)
MCH: 30.5 pg (ref 26.0–34.0)
MCHC: 33.5 g/dL (ref 30.0–36.0)
MCV: 91 fL (ref 78.0–100.0)
MONO ABS: 0.6 10*3/uL (ref 0.1–1.0)
MONOS PCT: 7 %
NEUTROS ABS: 7.6 10*3/uL (ref 1.7–7.7)
Neutrophils Relative %: 87 %
PLATELETS: 239 10*3/uL (ref 150–400)
RBC: 3.54 MIL/uL — ABNORMAL LOW (ref 4.22–5.81)
RDW: 14.1 % (ref 11.5–15.5)
WBC: 8.8 10*3/uL (ref 4.0–10.5)

## 2015-10-27 LAB — I-STAT CHEM 8, ED
BUN: 4 mg/dL — AB (ref 6–20)
CALCIUM ION: 1.07 mmol/L — AB (ref 1.13–1.30)
CHLORIDE: 96 mmol/L — AB (ref 101–111)
Creatinine, Ser: 0.8 mg/dL (ref 0.61–1.24)
Glucose, Bld: 106 mg/dL — ABNORMAL HIGH (ref 65–99)
HEMATOCRIT: 35 % — AB (ref 39.0–52.0)
Hemoglobin: 11.9 g/dL — ABNORMAL LOW (ref 13.0–17.0)
POTASSIUM: 3.5 mmol/L (ref 3.5–5.1)
SODIUM: 132 mmol/L — AB (ref 135–145)
TCO2: 24 mmol/L (ref 0–100)

## 2015-10-27 LAB — COMPREHENSIVE METABOLIC PANEL
ALT: 19 U/L (ref 17–63)
ANION GAP: 8 (ref 5–15)
AST: 23 U/L (ref 15–41)
Albumin: 3.6 g/dL (ref 3.5–5.0)
Alkaline Phosphatase: 152 U/L — ABNORMAL HIGH (ref 38–126)
BILIRUBIN TOTAL: 0.9 mg/dL (ref 0.3–1.2)
BUN: 6 mg/dL (ref 6–20)
CHLORIDE: 98 mmol/L — AB (ref 101–111)
CO2: 24 mmol/L (ref 22–32)
Calcium: 8.8 mg/dL — ABNORMAL LOW (ref 8.9–10.3)
Creatinine, Ser: 0.75 mg/dL (ref 0.61–1.24)
Glucose, Bld: 107 mg/dL — ABNORMAL HIGH (ref 65–99)
POTASSIUM: 3.5 mmol/L (ref 3.5–5.1)
Sodium: 130 mmol/L — ABNORMAL LOW (ref 135–145)
TOTAL PROTEIN: 7.7 g/dL (ref 6.5–8.1)

## 2015-10-27 LAB — I-STAT CG4 LACTIC ACID, ED
LACTIC ACID, VENOUS: 0.83 mmol/L (ref 0.5–1.9)
LACTIC ACID, VENOUS: 0.68 mmol/L (ref 0.5–1.9)

## 2015-10-27 MED ORDER — SODIUM CHLORIDE 0.9 % IV BOLUS (SEPSIS)
1000.0000 mL | Freq: Once | INTRAVENOUS | Status: AC
  Administered 2015-10-27: 1000 mL via INTRAVENOUS

## 2015-10-27 MED ORDER — ONDANSETRON HCL 4 MG/2ML IJ SOLN
4.0000 mg | Freq: Once | INTRAMUSCULAR | Status: AC
  Administered 2015-10-27: 4 mg via INTRAVENOUS
  Filled 2015-10-27: qty 2

## 2015-10-27 MED ORDER — NICOTINE 21 MG/24HR TD PT24
21.0000 mg | MEDICATED_PATCH | Freq: Every day | TRANSDERMAL | Status: DC
Start: 2015-10-27 — End: 2015-10-29
  Administered 2015-10-27 – 2015-10-29 (×3): 21 mg via TRANSDERMAL
  Filled 2015-10-27 (×3): qty 1

## 2015-10-27 MED ORDER — MORPHINE SULFATE (PF) 2 MG/ML IV SOLN
2.0000 mg | INTRAVENOUS | Status: DC | PRN
  Administered 2015-10-27 – 2015-10-29 (×12): 2 mg via INTRAVENOUS
  Filled 2015-10-27 (×13): qty 1

## 2015-10-27 MED ORDER — MORPHINE SULFATE (PF) 4 MG/ML IV SOLN
8.0000 mg | Freq: Once | INTRAVENOUS | Status: AC
  Administered 2015-10-27: 8 mg via INTRAVENOUS
  Filled 2015-10-27: qty 2

## 2015-10-27 MED ORDER — IOPAMIDOL (ISOVUE-300) INJECTION 61%
100.0000 mL | Freq: Once | INTRAVENOUS | Status: AC | PRN
Start: 2015-10-27 — End: 2015-10-27
  Administered 2015-10-27: 100 mL via INTRAVENOUS

## 2015-10-27 MED ORDER — CLINDAMYCIN PHOSPHATE 600 MG/50ML IV SOLN
600.0000 mg | Freq: Three times a day (TID) | INTRAVENOUS | Status: DC
  Administered 2015-10-27 – 2015-10-28 (×3): 600 mg via INTRAVENOUS
  Filled 2015-10-27 (×3): qty 50

## 2015-10-27 MED ORDER — LEVOTHYROXINE SODIUM 112 MCG PO TABS
112.0000 ug | ORAL_TABLET | Freq: Every day | ORAL | Status: DC
  Administered 2015-10-28 – 2015-10-29 (×2): 112 ug via ORAL
  Filled 2015-10-27 (×2): qty 1

## 2015-10-27 MED ORDER — ONDANSETRON HCL 4 MG/2ML IJ SOLN: 4.0000 mg | Freq: Four times a day (QID) | INTRAMUSCULAR | Status: DC | PRN

## 2015-10-27 MED ORDER — ACETAMINOPHEN 500 MG PO TABS
500.0000 mg | ORAL_TABLET | Freq: Four times a day (QID) | ORAL | Status: DC | PRN
  Administered 2015-10-27 – 2015-10-28 (×2): 500 mg via ORAL
  Filled 2015-10-27 (×2): qty 1

## 2015-10-27 MED ORDER — FAMOTIDINE IN NACL 20-0.9 MG/50ML-% IV SOLN
20.0000 mg | Freq: Two times a day (BID) | INTRAVENOUS | Status: DC
  Administered 2015-10-27 – 2015-10-28 (×3): 20 mg via INTRAVENOUS
  Filled 2015-10-27 (×4): qty 50

## 2015-10-27 NOTE — ED Notes (Signed)
LInda Hollings(sister) - [phone]

## 2015-10-27 NOTE — ED Notes (Signed)
Nausea subsided, abdominal pain subsided, leg pain 7/10 still.

## 2015-10-27 NOTE — H&P (Signed)
History and Physical  [name] [id number] DOB: 11/07/1960 DOA: 10/27/2015  PCP:  No primary care provider on file.   Chief Complaint:  RLE pain  History of Present Illness:  Patient is a 55 yo male with history of prostate ca s/p prostatectomy with abdominal surgery complicated by abdominal wall hernia who is here with cc of RLE pain/edema that started couple of days ago. He also continues to have abdominal pain at hernia site above the umbilicus with dry heaves but not vomiting or diarrhea. No fever but he had chills. No other complaints.   Review of Systems:  CONSTITUTIONAL:     No night sweats.  No fatigue.  No fever. +chills. Eyes:                            No visual changes.  No eye pain.  No eye discharge.   ENT:                              No epistaxis.  No sinus pain.  No sore throat.   No congestion. RESPIRATORY:           No cough.  No wheeze.  No hemoptysis.  No dyspnea CARDIOVASCULAR   :  No chest pains.  No palpitations. GASTROINTESTINAL:  +abdominal pain.  +nausea. No vomiting.  No diarrhea. No constipation.  No hematemesis.  No hematochezia.  No melena. GENITOURINARY:      No urgency.  No frequency.  No dysuria.  No hematuria.  No   obstructive symptoms.  No discharge.  No pain.  MUSCULOSKELETAL:  +musculoskeletal pain.  No joint swelling.  No arthritis. NEUROLOGICAL:        No confusion.  No weakness. No headache. No seizure. PSYCHIATRIC:             No depression. No anxiety. No suicidal ideation. SKIN:                             No rashes.  +lesions.  No wounds. ENDOCRINE:                No weight loss.  No polydipsia.  No polyuria.  No polyphagia. HEMATOLOGIC:           No purpura.  No petechiae.  No bleeding.  ALLERGIC                 : No pruritus.  No angioedema Other:  Past Medical and Surgical History:   Past Medical History  Diagnosis Date  . Cirrhosis of liver ([place])   . Prostate CA ([place])   . [facility])     s/p left orchiectomy  and xrt in 2010  . Back pain   . Dysuria   . Elevated PSA   . ED (erectile dysfunction)   . Hypogonadism, testicular   . Premature ejaculation   . Anxiety   . Alcoholism ([place])   . Depression   . Thyroid disease     hyperthyroidism  . Neck fracture ([place])   . Stenosis, cervical spine   . Pneumonia     hx of pneumonia x 2   . Hypothyroidism   . Seizure ([place])     pt states last seizure was many years ago and stopped dilantin in 2015  . Perioperative dehiscence of abdominal wound with evisceration  08/07/2015   Past Surgical History  Procedure Laterality Date  . Prostate surgery    . Appendectomy    . Left orchiectomy    . Back surgery    . Dental surgery    . Leg surgery    . Neck surgery    . Robot assisted laparoscopic radical prostatectomy N/A 07/30/2015    Procedure: XI ROBOTIC ASSISTED LAPAROSCOPIC RADICAL PROSTATECTOMY LEVEL 2;  Surgeon: Raynelle Bring, MD;  [facility];  Service: Urology;  Laterality: N/A;  . Lymphadenectomy Bilateral 07/30/2015    Procedure: BILATERAL PELVIC LYMPHADENECTOMY;  Surgeon: Raynelle Bring, MD;  [facility];  Service: Urology;  Laterality: Bilateral;  . Wound exploration N/A 08/07/2015    Procedure: EXPLORATORY LAPAROTOMY WITH WOUND CLOSURE;  Surgeon: Raynelle Bring, MD;  Location[facility];  Service: Urology;  Laterality: N/A;    Social History:   reports that he has been smoking Cigarettes.  He has a 54 pack-year smoking history. He has never used smokeless tobacco. He reports that he drinks alcohol. He reports that he does not use illicit drugs.    Allergies  Allergen Reactions  . Hydrocodone Nausea And Vomiting  . Motrin [Ibuprofen]     Avoids due to liver cirrhosis.    Family History  Problem Relation Age of Onset  . Cancer Paternal Uncle     leukemia  . Cancer Father     testicular (per [facility] notes)      Prior to Admission medications   Medication Sig Start Date End Date Taking? Authorizing Provider  furosemide (LASIX)  40 MG tablet Take 40 mg by mouth daily.   Yes Historical Provider, MD  levothyroxine (SYNTHROID, LEVOTHROID) 112 MCG tablet Take 112 mcg by mouth every morning.   Yes Historical Provider, MD  potassium chloride (MICRO-K) 10 MEQ CR capsule Take 10 mEq by mouth every morning.   Yes Historical Provider, MD  spironolactone (ALDACTONE) 50 MG tablet Take 50 mg by mouth 2 (two) times daily.   Yes Historical Provider, MD  HYDROcodone-acetaminophen (NORCO/VICODIN) 5-325 MG tablet Take 1 tablet by mouth every 6 (six) hours as needed for moderate pain or severe pain. Patient not taking: Reported on 10/27/2015 08/09/15   Christell Faith, MD  oxyCODONE (ROXICODONE) 5 MG immediate release tablet Take 1 tablet (5 mg total) by mouth every 4 (four) hours as needed for severe pain. May take 2 tablets every 6 hours for pain Patient not taking: Reported on 08/07/2015 07/30/15   Debbrah Alar, PA-C  oxyCODONE (ROXICODONE) 5 MG immediate release tablet Take 1 tablet (5 mg total) by mouth every 4 (four) hours as needed for severe pain. Patient not taking: Reported on 10/27/2015 08/07/15   Raynelle Bring, MD  sulfamethoxazole-trimethoprim (BACTRIM DS,SEPTRA DS) 800-160 MG tablet Take 1 tablet by mouth 2 (two) times daily. Start the day prior to foley removal appointment Patient not taking: Reported on 10/27/2015 07/30/15   Debbrah Alar, PA-C    Physical Exam: BP 141/60 mmHg  Pulse 75  Temp(Src) 99.1 F (37.3 C) (Oral)  Resp 18  SpO2 100%  GENERAL :   Alert and cooperative, and appears to be in no acute distress. HEAD:           normocephalic. EYES:            PERRL, EOMI.  vision is grossly intact. EARS:           hearing grossly intact. NOSE:           No  nasal discharge. THROAT:     Oral cavity and pharynx normal.   NECK:          supple, non-tender.  CARDIAC:    Normal S1 and S2. No gallop. No murmurs.  Vascular:     RLL edema LUNGS:       Clear to auscultation  ABDOMEN: Positive bowel sounds. Guarding, tender to  palpation , supraumbilical abdominal wall hernia, warm to touch, no rebound.       MSK:           No joint erythema or tenderness.  EXT           : No significant deformity or joint abnormality. Neuro        : Alert, oriented to person, place, and time.                      CN II-XII intact.                        SKIN:           RLE edema with warmness and pain to palpation  PSYCH:       No hallucination. Patient is not suicidal.          Labs on Admission:  Reviewed.   Radiological Exams on Admission: Ct Abdomen Pelvis W Contrast  10/27/2015  CLINICAL DATA:  Abdominal pain and right leg swelling EXAM: CT ABDOMEN AND PELVIS WITH CONTRAST TECHNIQUE: Multidetector CT imaging of the abdomen and pelvis was performed using the standard protocol following bolus administration of intravenous contrast. CONTRAST:  184mL ISOVUE-300 IOPAMIDOL (ISOVUE-300) INJECTION 61% COMPARISON:  08/09/2015 FINDINGS: Lower chest:  No acute findings. Hepatobiliary: Nodularity is noted consistent with underlying cirrhotic change. No definitive mass lesion is noted. The gallbladder is within normal limits. Pancreas: No mass, inflammatory changes, or other significant abnormality. Spleen: Single calcified granuloma is noted within. Adrenals/Urinary Tract: No masses identified. No evidence of hydronephrosis. Bladder is well distended. Stomach/Bowel: The appendix is not well visualized. Large and small bowel as well as stomach are within normal limits. Vascular/Lymphatic: Aortoiliac calcifications are identified. No aneurysmal dilatation is seen Reproductive: Status post prostatectomy Other: In the right lateral pelvic wall there is a bilobed fluid collection. The larger component measures 5.3 cm in greatest dimension. The smaller component measures 3.4 cm in greatest dimension. This has increased from a maximum dimension of 4.5 cm on the prior exam it is consistent with a developing lymphocele. The previously seen ascites has  decreased in the interval. Small lymphocele is also noted on the left lateral pelvic wall adjacent to the external iliac artery best seen on image number 60. This is stable in appearance from the prior exam. An anterior abdominal wall hernia is noted just above the umbilicus. This is increased in size in contains only omental fat. Musculoskeletal: Postsurgical changes are noted in the lumbar spine. IMPRESSION: Enlarging anterior abdominal wall hernia just above the umbilicus. Decreasing ascites. Small fluid collections adjacent to the external iliac arteries bilaterally consistent with postoperative lymphoceles. The 1 on the right has increased in size when compared with the prior exam. This may be causing some compression upon the external iliac vein leading to the lower extremity edema. Cirrhotic change of the liver stable from the previous exam. Electronically Signed   By: Inez Catalina M.D.   On: 10/27/2015 14:04     Assessment/Plan  Cellulitis:  Predisposing factor:  edema.  Non  purulent cellulitis : empiric coverage for beta-hemolytic strep and MSSA/MRSA. Started on clindamycin  Duration of therapy 5-14 days.   Abdominal pain: Likely due to hernia CT abdomen as above; small fluid collection unlikely septic Gen surgery consulted in the ER Morphine prn, zofran prn  Hx of prostate cancer: S/p prostatectomy   Input & Output:  NA Lines & Tubes: PIV DVT prophylaxis: [place] enoxaparin  GI prophylaxis: Pepcid  Consultants: Gen surgery  Code Status: Full Family Communication: At bedside  Disposition Plan: Admit    Gennaro Africa M.D [facility]

## 2015-10-27 NOTE — ED Notes (Signed)
Pt c/o right leg swelling and pain. Pt states swelling started around his ankle and has been spreading to his knee for the past 3 days. Pt denies fall or injury. Pt states he has been nauseas for about a week and having "muscle spasms in his stomach." Pt currently has an umbilical hernia that needs surgery but is waiting til July 12th to get cleared for surgery after recent surgery for prostate cancer.

## 2015-10-27 NOTE — ED Notes (Signed)
RN will get 2nd set of blood cultures with start of IV

## 2015-10-27 NOTE — ED Provider Notes (Signed)
CSN: [id number]     Arrival date & time 10/27/15  1121 History   First MD Initiated Contact with Patient 10/27/15 1212     Chief Complaint  Patient presents with  . Leg Pain  . Nausea     (Consider location/radiation/quality/duration/timing/severity/associated sxs/prior Treatment) Patient is a 55 y.o. male presenting with leg pain. The history is provided by the patient.  Leg Pain Location:  Leg Time since incident:  2 days Injury: no   Leg location:  R leg Pain details:    Quality:  Aching   Radiates to:  Does not radiate   Severity:  Moderate   Onset quality:  Sudden   Duration:  2 days   Timing:  Constant   Progression:  Worsening Chronicity:  New Dislocation: no   Relieved by:  Nothing Worsened by:  Nothing tried Ineffective treatments:  None tried Associated symptoms: no fever    55 yo M With a chief complaint of right lower externally pain. This been going on since yesterday. Patient feeling nauseated having subjective fevers and chills. Patient has a history of multiple surgeries done to that leg after he was cutting a traumatic event. He denies that it is chronically more swollen than baseline.  Past Medical History  Diagnosis Date  . Cirrhosis of liver ([place])   . Prostate CA ([place])   . [facility])     s/p left orchiectomy and xrt in 2010  . Back pain   . Dysuria   . Elevated PSA   . ED (erectile dysfunction)   . Hypogonadism, testicular   . Premature ejaculation   . Anxiety   . Alcoholism ([place])   . Depression   . Thyroid disease     hyperthyroidism  . Neck fracture ([place])   . Stenosis, cervical spine   . Pneumonia     hx of pneumonia x 2   . Hypothyroidism   . Seizure ([place])     pt states last seizure was many years ago and stopped dilantin in 2015  . Perioperative dehiscence of abdominal wound with evisceration 08/07/2015   Past Surgical History  Procedure Laterality Date  . Prostate surgery    . Appendectomy    . Left orchiectomy    . Back  surgery    . Dental surgery    . Leg surgery    . Neck surgery    . Robot assisted laparoscopic radical prostatectomy N/A 07/30/2015    Procedure: XI ROBOTIC ASSISTED LAPAROSCOPIC RADICAL PROSTATECTOMY LEVEL 2;  Surgeon: Raynelle Bring, MD;  Location: [facility];  Service: Urology;  Laterality: N/A;  . Lymphadenectomy Bilateral 07/30/2015    Procedure: BILATERAL PELVIC LYMPHADENECTOMY;  Surgeon: Raynelle Bring, MD;  Location: [facility];  Service: Urology;  Laterality: Bilateral;  . Wound exploration N/A 08/07/2015    Procedure: EXPLORATORY LAPAROTOMY WITH WOUND CLOSURE;  Surgeon: Raynelle Bring, MD;  Location: [facility];  Service: Urology;  Laterality: N/A;   Family History  Problem Relation Age of Onset  . Cancer Paternal Uncle     leukemia  . Cancer Father     testicular (per [facility] notes)   Social History  Substance Use Topics  . Smoking status: Current Every Day Smoker -- 1.50 packs/day for 36 years    Types: Cigarettes  . Smokeless tobacco: Never Used  . Alcohol Use: Yes     Comment: quit 2012     Review of Systems  Constitutional: Negative for fever and chills.  HENT: Negative for congestion and  facial swelling.   Eyes: Negative for discharge and visual disturbance.  Respiratory: Negative for shortness of breath.   Cardiovascular: Negative for chest pain and palpitations.  Gastrointestinal: Negative for vomiting, abdominal pain and diarrhea.  Musculoskeletal: Negative for myalgias and arthralgias.  Skin: Negative for color change and rash.  Neurological: Negative for tremors, syncope and headaches.  Psychiatric/Behavioral: Negative for confusion and dysphoric mood.      Allergies  Hydrocodone and Motrin  Home Medications   Prior to Admission medications   Medication Sig Start Date End Date Taking? Authorizing Provider  furosemide (LASIX) 40 MG tablet Take 40 mg by mouth daily.   Yes Historical Provider, MD  levothyroxine (SYNTHROID, LEVOTHROID) 112 MCG tablet Take 112 mcg by  mouth every morning.   Yes Historical Provider, MD  potassium chloride (MICRO-K) 10 MEQ CR capsule Take 10 mEq by mouth every morning.   Yes Historical Provider, MD  spironolactone (ALDACTONE) 50 MG tablet Take 50 mg by mouth 2 (two) times daily.   Yes Historical Provider, MD  HYDROcodone-acetaminophen (NORCO/VICODIN) 5-325 MG tablet Take 1 tablet by mouth every 6 (six) hours as needed for moderate pain or severe pain. Patient not taking: Reported on 10/27/2015 08/09/15   Christell Faith, MD  oxyCODONE (ROXICODONE) 5 MG immediate release tablet Take 1 tablet (5 mg total) by mouth every 4 (four) hours as needed for severe pain. May take 2 tablets every 6 hours for pain Patient not taking: Reported on 08/07/2015 07/30/15   Debbrah Alar, PA-C  oxyCODONE (ROXICODONE) 5 MG immediate release tablet Take 1 tablet (5 mg total) by mouth every 4 (four) hours as needed for severe pain. Patient not taking: Reported on 10/27/2015 08/07/15   Raynelle Bring, MD  sulfamethoxazole-trimethoprim (BACTRIM DS,SEPTRA DS) 800-160 MG tablet Take 1 tablet by mouth 2 (two) times daily. Start the day prior to foley removal appointment Patient not taking: Reported on 10/27/2015 07/30/15   Debbrah Alar, PA-C   BP 155/63 mmHg  Pulse 74  Temp(Src) 99.1 F (37.3 C) (Oral)  Resp 18  SpO2 98% Physical Exam  Constitutional: He is oriented to person, place, and time. He appears well-developed and well-nourished.  HENT:  Head: Normocephalic and atraumatic.  Eyes: EOM are normal. Pupils are equal, round, and reactive to light.  Neck: Normal range of motion. Neck supple. No JVD present.  Cardiovascular: Normal rate and regular rhythm.  Exam reveals no gallop and no friction rub.   No murmur heard. Pulmonary/Chest: No respiratory distress. He has no wheezes.  Abdominal: He exhibits no distension. There is no rebound and no guarding.  Musculoskeletal: Normal range of motion.  Neurological: He is alert and oriented to person, place, and time.   Skin: No rash noted. No pallor.  Psychiatric: He has a normal mood and affect. His behavior is normal.  Nursing note and vitals reviewed.   ED Course  Procedures (including critical care time) Labs Review Labs Reviewed  CBC WITH DIFFERENTIAL/PLATELET - Abnormal; Notable for the following:    RBC 3.54 (*)    Hemoglobin 10.8 (*)    HCT 32.2 (*)    Lymphs Abs 0.5 (*)    All other components within normal limits  COMPREHENSIVE METABOLIC PANEL - Abnormal; Notable for the following:    Sodium 130 (*)    Chloride 98 (*)    Glucose, Bld 107 (*)    Calcium 8.8 (*)    Alkaline Phosphatase 152 (*)    All other components within normal limits  I-STAT CHEM 8, ED - Abnormal; Notable for the following:    Sodium 132 (*)    Chloride 96 (*)    BUN 4 (*)    Glucose, Bld 106 (*)    Calcium, Ion 1.07 (*)    Hemoglobin 11.9 (*)    HCT 35.0 (*)    All other components within normal limits  CULTURE, BLOOD (ROUTINE X 2)  CULTURE, BLOOD (ROUTINE X 2)  I-STAT CG4 LACTIC ACID, ED  I-STAT CG4 LACTIC ACID, ED    Imaging Review Ct Abdomen Pelvis W Contrast  10/27/2015  CLINICAL DATA:  Abdominal pain and right leg swelling EXAM: CT ABDOMEN AND PELVIS WITH CONTRAST TECHNIQUE: Multidetector CT imaging of the abdomen and pelvis was performed using the standard protocol following bolus administration of intravenous contrast. CONTRAST:  179mL ISOVUE-300 IOPAMIDOL (ISOVUE-300) INJECTION 61% COMPARISON:  08/09/2015 FINDINGS: Lower chest:  No acute findings. Hepatobiliary: Nodularity is noted consistent with underlying cirrhotic change. No definitive mass lesion is noted. The gallbladder is within normal limits. Pancreas: No mass, inflammatory changes, or other significant abnormality. Spleen: Single calcified granuloma is noted within. Adrenals/Urinary Tract: No masses identified. No evidence of hydronephrosis. Bladder is well distended. Stomach/Bowel: The appendix is not well visualized. Large and small bowel as  well as stomach are within normal limits. Vascular/Lymphatic: Aortoiliac calcifications are identified. No aneurysmal dilatation is seen Reproductive: Status post prostatectomy Other: In the right lateral pelvic wall there is a bilobed fluid collection. The larger component measures 5.3 cm in greatest dimension. The smaller component measures 3.4 cm in greatest dimension. This has increased from a maximum dimension of 4.5 cm on the prior exam it is consistent with a developing lymphocele. The previously seen ascites has decreased in the interval. Small lymphocele is also noted on the left lateral pelvic wall adjacent to the external iliac artery best seen on image number 60. This is stable in appearance from the prior exam. An anterior abdominal wall hernia is noted just above the umbilicus. This is increased in size in contains only omental fat. Musculoskeletal: Postsurgical changes are noted in the lumbar spine. IMPRESSION: Enlarging anterior abdominal wall hernia just above the umbilicus. Decreasing ascites. Small fluid collections adjacent to the external iliac arteries bilaterally consistent with postoperative lymphoceles. The 1 on the right has increased in size when compared with the prior exam. This may be causing some compression upon the external iliac vein leading to the lower extremity edema. Cirrhotic change of the liver stable from the previous exam. Electronically Signed   By: Inez Catalina M.D.   On: 10/27/2015 14:04   I have personally reviewed and evaluated these images and lab results as part of my medical decision-making.   EKG Interpretation None      MDM   Final diagnoses:  Pain of right lower extremity  Cellulitis and abscess of leg    55 yo M with a chief complaint of right lower extremity pain. Patient has a calculated history with prostate cancer is also complaining of abdominal pain. Will obtain a CT scan of the abdomen and pelvis contrast. Obtain a DVT study of the right  lower extremity. Clinically appears that he has cellulitis to the right lower extremity. Will treat him with clindamycin. Blood cultures. Admit.   CT scan with enlarged lymphocele that may be compressing the external iliacs. Discussed with general surgery, Dr. Marcello Moores. She feels nothing to do from their standpoint.  Will admit for likely cellultis.   The patients results and plan  were reviewed and discussed.   Any x-rays performed were independently reviewed by myself.   Differential diagnosis were considered with the presenting HPI.  Medications  sodium chloride 0.9 % bolus 1,000 mL (1,000 mLs Intravenous New Bag/Given 10/27/15 1304)  ondansetron (ZOFRAN) injection 4 mg (4 mg Intravenous Given 10/27/15 1323)  morphine 4 MG/ML injection 8 mg (8 mg Intravenous Given 10/27/15 1323)  iopamidol (ISOVUE-300) 61 % injection 100 mL (100 mLs Intravenous Contrast Given 10/27/15 1340)  morphine 4 MG/ML injection 8 mg (8 mg Intravenous Given 10/27/15 1407)  ondansetron (ZOFRAN) injection 4 mg (4 mg Intravenous Given 10/27/15 1407)    Filed Vitals:   10/27/15 1330 10/27/15 1400 10/27/15 1409 10/27/15 1442  BP: 140/58 153/64  155/63  Pulse: 72 70  74  Temp:   99.1 F (37.3 C)   TempSrc:   Oral   Resp:    18  SpO2: 100% 99%  98%    Final diagnoses:  Pain of right lower extremity  Cellulitis and abscess of leg         Deno Etienne, DO 10/27/15 1512

## 2015-10-27 NOTE — ED Notes (Signed)
Patient reports he had surgery in April for prostate cancer and has had multiple surgeries since then in his abdominal area.  He started with pain in this area a week and a half ago.  Right leg started swelling along with redness two days ago.  He is having pain in that leg as well he rates as a 7/10.

## 2015-10-27 NOTE — Progress Notes (Signed)
VASCULAR LAB PRELIMINARY  PRELIMINARY  PRELIMINARY  PRELIMINARY  Right lower extremity venous duplex completed.    Preliminary report:  Right:  No obvious evidence of DVT, superficial thrombosis, or Baker's cyst.  Peroneal vein not adequately visualized.  Lyell Clugston, RVT 10/27/2015, 1:18 PM

## 2015-10-28 ENCOUNTER — Encounter (HOSPITAL_COMMUNITY): Payer: Self-pay | Admitting: Surgery

## 2015-10-28 ENCOUNTER — Observation Stay (HOSPITAL_COMMUNITY): Payer: Medicaid Other

## 2015-10-28 DIAGNOSIS — L0390 Cellulitis, unspecified: Secondary | ICD-10-CM | POA: Diagnosis present

## 2015-10-28 DIAGNOSIS — L02419 Cutaneous abscess of limb, unspecified: Secondary | ICD-10-CM | POA: Diagnosis present

## 2015-10-28 DIAGNOSIS — G8929 Other chronic pain: Secondary | ICD-10-CM | POA: Diagnosis present

## 2015-10-28 DIAGNOSIS — K432 Incisional hernia without obstruction or gangrene: Secondary | ICD-10-CM | POA: Diagnosis present

## 2015-10-28 DIAGNOSIS — Z9079 Acquired absence of other genital organ(s): Secondary | ICD-10-CM | POA: Diagnosis not present

## 2015-10-28 DIAGNOSIS — Z886 Allergy status to analgesic agent status: Secondary | ICD-10-CM | POA: Diagnosis not present

## 2015-10-28 DIAGNOSIS — L03119 Cellulitis of unspecified part of limb: Secondary | ICD-10-CM

## 2015-10-28 DIAGNOSIS — F17210 Nicotine dependence, cigarettes, uncomplicated: Secondary | ICD-10-CM | POA: Diagnosis present

## 2015-10-28 DIAGNOSIS — Z87440 Personal history of urinary (tract) infections: Secondary | ICD-10-CM | POA: Diagnosis not present

## 2015-10-28 DIAGNOSIS — L03115 Cellulitis of right lower limb: Secondary | ICD-10-CM | POA: Diagnosis present

## 2015-10-28 DIAGNOSIS — N390 Urinary tract infection, site not specified: Secondary | ICD-10-CM

## 2015-10-28 DIAGNOSIS — E669 Obesity, unspecified: Secondary | ICD-10-CM | POA: Diagnosis present

## 2015-10-28 DIAGNOSIS — C61 Malignant neoplasm of prostate: Secondary | ICD-10-CM | POA: Diagnosis present

## 2015-10-28 DIAGNOSIS — Z8043 Family history of malignant neoplasm of testis: Secondary | ICD-10-CM | POA: Diagnosis not present

## 2015-10-28 DIAGNOSIS — B9620 Unspecified Escherichia coli [E. coli] as the cause of diseases classified elsewhere: Secondary | ICD-10-CM | POA: Diagnosis present

## 2015-10-28 DIAGNOSIS — K7460 Unspecified cirrhosis of liver: Secondary | ICD-10-CM | POA: Diagnosis present

## 2015-10-28 DIAGNOSIS — C6292 Malignant neoplasm of left testis, unspecified whether descended or undescended: Secondary | ICD-10-CM | POA: Diagnosis present

## 2015-10-28 DIAGNOSIS — S81801A Unspecified open wound, right lower leg, initial encounter: Secondary | ICD-10-CM | POA: Diagnosis present

## 2015-10-28 DIAGNOSIS — M79604 Pain in right leg: Secondary | ICD-10-CM | POA: Diagnosis present

## 2015-10-28 DIAGNOSIS — Z79899 Other long term (current) drug therapy: Secondary | ICD-10-CM | POA: Diagnosis not present

## 2015-10-28 DIAGNOSIS — I898 Other specified noninfective disorders of lymphatic vessels and lymph nodes: Secondary | ICD-10-CM | POA: Diagnosis present

## 2015-10-28 DIAGNOSIS — Z6831 Body mass index (BMI) 31.0-31.9, adult: Secondary | ICD-10-CM | POA: Diagnosis not present

## 2015-10-28 DIAGNOSIS — Z720 Tobacco use: Secondary | ICD-10-CM | POA: Diagnosis present

## 2015-10-28 DIAGNOSIS — T8189XA Other complications of procedures, not elsewhere classified, initial encounter: Secondary | ICD-10-CM

## 2015-10-28 DIAGNOSIS — E871 Hypo-osmolality and hyponatremia: Secondary | ICD-10-CM | POA: Diagnosis present

## 2015-10-28 DIAGNOSIS — Z885 Allergy status to narcotic agent status: Secondary | ICD-10-CM | POA: Diagnosis not present

## 2015-10-28 DIAGNOSIS — F419 Anxiety disorder, unspecified: Secondary | ICD-10-CM | POA: Diagnosis present

## 2015-10-28 DIAGNOSIS — I878 Other specified disorders of veins: Secondary | ICD-10-CM | POA: Diagnosis present

## 2015-10-28 DIAGNOSIS — Z8547 Personal history of malignant neoplasm of testis: Secondary | ICD-10-CM | POA: Diagnosis not present

## 2015-10-28 DIAGNOSIS — Z806 Family history of leukemia: Secondary | ICD-10-CM | POA: Diagnosis not present

## 2015-10-28 DIAGNOSIS — M6208 Separation of muscle (nontraumatic), other site: Secondary | ICD-10-CM | POA: Diagnosis present

## 2015-10-28 DIAGNOSIS — Z923 Personal history of irradiation: Secondary | ICD-10-CM | POA: Diagnosis not present

## 2015-10-28 DIAGNOSIS — K429 Umbilical hernia without obstruction or gangrene: Secondary | ICD-10-CM | POA: Diagnosis present

## 2015-10-28 HISTORY — DX: Urinary tract infection, site not specified: B96.20

## 2015-10-28 HISTORY — DX: Urinary tract infection, site not specified: N39.0

## 2015-10-28 HISTORY — DX: Other complications of procedures, not elsewhere classified, initial encounter: T81.89XA

## 2015-10-28 LAB — BASIC METABOLIC PANEL
ANION GAP: 8 (ref 5–15)
BUN: 9 mg/dL (ref 6–20)
CALCIUM: 8.1 mg/dL — AB (ref 8.9–10.3)
CO2: 24 mmol/L (ref 22–32)
CREATININE: 1.11 mg/dL (ref 0.61–1.24)
Chloride: 100 mmol/L — ABNORMAL LOW (ref 101–111)
GFR calc non Af Amer: >60 mL/min (ref >60)
Glucose, Bld: 173 mg/dL — ABNORMAL HIGH (ref 65–99)
Potassium: 3.4 mmol/L — ABNORMAL LOW (ref 3.5–5.1)
SODIUM: 132 mmol/L — AB (ref 135–145)

## 2015-10-28 LAB — PSA: PSA: 0.13 ng/mL (ref 0.00–4.00)

## 2015-10-28 LAB — CBC
HCT: 28.5 % — ABNORMAL LOW (ref 39.0–52.0)
HEMOGLOBIN: 9.7 g/dL — AB (ref 13.0–17.0)
MCH: 31.2 pg (ref 26.0–34.0)
MCHC: 34 g/dL (ref 30.0–36.0)
MCV: 91.6 fL (ref 78.0–100.0)
PLATELETS: 201 10*3/uL (ref 150–400)
RBC: 3.11 MIL/uL — AB (ref 4.22–5.81)
RDW: 14.5 % (ref 11.5–15.5)
WBC: 6.4 10*3/uL (ref 4.0–10.5)

## 2015-10-28 MED ORDER — POTASSIUM CHLORIDE CRYS ER 20 MEQ PO TBCR
20.0000 meq | EXTENDED_RELEASE_TABLET | Freq: Once | ORAL | Status: AC
  Administered 2015-10-28: 20 meq via ORAL
  Filled 2015-10-28: qty 1

## 2015-10-28 MED ORDER — LORAZEPAM 0.5 MG PO TABS
0.5000 mg | ORAL_TABLET | Freq: Once | ORAL | Status: AC
  Administered 2015-10-28: 0.5 mg via ORAL
  Filled 2015-10-28: qty 1

## 2015-10-28 MED ORDER — HEPARIN SODIUM (PORCINE) 5000 UNIT/ML IJ SOLN
5000.0000 [IU] | Freq: Three times a day (TID) | INTRAMUSCULAR | Status: DC
  Administered 2015-10-28 (×2): 5000 [IU] via SUBCUTANEOUS
  Filled 2015-10-28 (×2): qty 1

## 2015-10-28 MED ORDER — SODIUM CHLORIDE 0.9 % IV SOLN
3.0000 g | Freq: Four times a day (QID) | INTRAVENOUS | Status: DC
  Administered 2015-10-28 – 2015-10-29 (×4): 3 g via INTRAVENOUS
  Filled 2015-10-28 (×5): qty 3

## 2015-10-28 NOTE — Progress Notes (Signed)
Patient given smoking cessation education hand out per MD's orders

## 2015-10-28 NOTE — Progress Notes (Addendum)
PROGRESS NOTE    [name]  [id number] DOB: 1960/05/11 DOA: 10/27/2015 PCP: No primary care provider on file.   Brief Narrative: Patient is a 55 yo male with history of prostate ca s/p prostatectomy with abdominal surgery complicated by abdominal wall hernia who is here with cc of RLE pain/edema that started couple of days ago. He also continues to have abdominal pain at hernia site above the umbilicus with dry heaves but not vomiting or diarrhea. No fever but he had chills. No other complaints.    Assessment & Plan:   Cellulitis and abscess of leg   Hepatic cirrhosis (HCC)   Prostate cancer ([place])   Cellulitis  1-Right Lower extremity cellulitis;  Edema better per patient.  Doppler negative for DVT.  CT with lymphocele, might be compressing external iliac vein.  Still with fever will add Unasyn, discontinue clindamycin.  Check x ray   2-Abdominal pain, umbilical hernia CT with lymphocele.  Still with fevers will add Unasyn.  Surgery consulted.   3-Hyponatremia; follow, monitor. Might be related to cirrhosis.   4-Hepatic Cirrhosis;  No evidence of encephalopathy   5-recent prostatectomy;  Will inform urology of admission.    DVT prophylaxis:  Code Status: full code.  Family Communication: significant other at bedside.  Disposition Plan: home in 3 to 4 days    Consultants:   Surgery   Procedures:   Doppler negative for DVT   Antimicrobials:  Clindamycin    Subjective: He is feeling better, abdominal pain improved, umbilical hernia  Going down.  Swelling Lower extremity decreasing.   Objective: Filed Vitals:   10/27/15 1828 10/27/15 2106 10/28/15 0545 10/28/15 0756  BP:  112/43 131/70   Pulse:  69 69   Temp: 102.7 F (39.3 C) 101.9 F (38.8 C) 101.8 F (38.8 C) 100 F (37.8 C)  TempSrc: Oral Oral Oral Oral  Resp:  16 16   Height:      Weight:      SpO2:  97% 100%     Intake/Output Summary (Last 24 hours) at 10/28/15 0839 Last data  filed at 10/27/15 1700  Gross per 24 hour  Intake    240 ml  Output      0 ml  Net    240 ml   Filed Weights   10/27/15 1516  Weight: 101.152 kg (223 lb)    Examination:  General exam: Appears calm and comfortable  Respiratory system: Clear to auscultation. Respiratory effort normal. Cardiovascular system: S1 & S2 heard, RRR. No JVD, murmurs, rubs, gallops or clicks. No pedal edema. Gastrointestinal system: Abdomen is distended, soft and nontender. Umbilical hernia reducible.  Central nervous system: Alert and oriented. No focal neurological deficits. Extremities: Symmetric 5 x 5 power. Skin:right Lower extremity with edema, less redness.   Psychiatry: Judgement and insight appear normal. Mood & affect appropriate.     Data Reviewed: I have personally reviewed following labs and imaging studies  CBC:  Recent Labs Lab 10/27/15 1250 10/27/15 1303 10/28/15 0810  WBC 8.8  --  6.4  NEUTROABS 7.6  --   --   HGB 10.8* 11.9* 9.7*  HCT 32.2* 35.0* 28.5*  MCV 91.0  --  91.6  PLT 239  --  123456   Basic Metabolic Panel:  Recent Labs Lab 10/27/15 1250 10/27/15 1303  NA 130* 132*  K 3.5 3.5  CL 98* 96*  CO2 24  --   GLUCOSE 107* 106*  BUN 6 4*  CREATININE 0.75 0.80  CALCIUM 8.8*  --    GFR: Estimated Creatinine Clearance: 130.9 mL/min (by C-G formula based on Cr of 0.8). Liver Function Tests:  Recent Labs Lab 10/27/15 1250  AST 23  ALT 19  ALKPHOS 152*  BILITOT 0.9  PROT 7.7  ALBUMIN 3.6   No results for input(s): LIPASE, AMYLASE in the last 168 hours. No results for input(s): AMMONIA in the last 168 hours. Coagulation Profile: No results for input(s): INR, PROTIME in the last 168 hours. Cardiac Enzymes: No results for input(s): CKTOTAL, CKMB, CKMBINDEX, TROPONINI in the last 168 hours. BNP (last 3 results) No results for input(s): PROBNP in the last 8760 hours. HbA1C: No results for input(s): HGBA1C in the last 72 hours. CBG: No results for input(s):  GLUCAP in the last 168 hours. Lipid Profile: No results for input(s): CHOL, HDL, LDLCALC, TRIG, CHOLHDL, LDLDIRECT in the last 72 hours. Thyroid Function Tests: No results for input(s): TSH, T4TOTAL, FREET4, T3FREE, THYROIDAB in the last 72 hours. Anemia Panel: No results for input(s): VITAMINB12, FOLATE, FERRITIN, TIBC, IRON, RETICCTPCT in the last 72 hours. Sepsis Labs:  Recent Labs Lab 10/27/15 1303 10/27/15 1636  LATICACIDVEN 0.83 0.68    Recent Results (from the past 240 hour(s))  Blood culture (routine x 2)     Status: None (Preliminary result)   Collection Time: 10/27/15 12:50 PM  Result Value Ref Range Status   Specimen Description BLOOD LEFT ARM  Final   Special Requests   Final    BOTTLES DRAWN AEROBIC AND ANAEROBIC 10CC Performed at [facility]    Culture PENDING  Incomplete   Report Status PENDING  Incomplete  Blood culture (routine x 2)     Status: None (Preliminary result)   Collection Time: 10/27/15  1:05 PM  Result Value Ref Range Status   Specimen Description BLOOD RIGHT ARM RIGHT ARM  5 ML IN [facility] BOTTLE  Final   Special Requests   Final    BOTTLES DRAWN AEROBIC AND ANAEROBIC 5CC Performed at [facility]    Culture PENDING  Incomplete   Report Status PENDING  Incomplete         Radiology Studies: Ct Abdomen Pelvis W Contrast  10/27/2015  CLINICAL DATA:  Abdominal pain and right leg swelling EXAM: CT ABDOMEN AND PELVIS WITH CONTRAST TECHNIQUE: Multidetector CT imaging of the abdomen and pelvis was performed using the standard protocol following bolus administration of intravenous contrast. CONTRAST:  144mL ISOVUE-300 IOPAMIDOL (ISOVUE-300) INJECTION 61% COMPARISON:  08/09/2015 FINDINGS: Lower chest:  No acute findings. Hepatobiliary: Nodularity is noted consistent with underlying cirrhotic change. No definitive mass lesion is noted. The gallbladder is within normal limits. Pancreas: No mass, inflammatory changes, or other significant  abnormality. Spleen: Single calcified granuloma is noted within. Adrenals/Urinary Tract: No masses identified. No evidence of hydronephrosis. Bladder is well distended. Stomach/Bowel: The appendix is not well visualized. Large and small bowel as well as stomach are within normal limits. Vascular/Lymphatic: Aortoiliac calcifications are identified. No aneurysmal dilatation is seen Reproductive: Status post prostatectomy Other: In the right lateral pelvic wall there is a bilobed fluid collection. The larger component measures 5.3 cm in greatest dimension. The smaller component measures 3.4 cm in greatest dimension. This has increased from a maximum dimension of 4.5 cm on the prior exam it is consistent with a developing lymphocele. The previously seen ascites has decreased in the interval. Small lymphocele is also noted on the left lateral pelvic wall adjacent to the external iliac artery best seen on  image number 60. This is stable in appearance from the prior exam. An anterior abdominal wall hernia is noted just above the umbilicus. This is increased in size in contains only omental fat. Musculoskeletal: Postsurgical changes are noted in the lumbar spine. IMPRESSION: Enlarging anterior abdominal wall hernia just above the umbilicus. Decreasing ascites. Small fluid collections adjacent to the external iliac arteries bilaterally consistent with postoperative lymphoceles. The 1 on the right has increased in size when compared with the prior exam. This may be causing some compression upon the external iliac vein leading to the lower extremity edema. Cirrhotic change of the liver stable from the previous exam. Electronically Signed   By: Inez Catalina M.D.   On: 10/27/2015 14:04        Scheduled Meds: . clindamycin (CLEOCIN) IV  600 mg Intravenous Q8H  . famotidine (PEPCID) IV  20 mg Intravenous Q12H  . levothyroxine  112 mcg Oral QAC breakfast  . nicotine  21 mg Transdermal Daily   Continuous Infusions:        Time spent: 35 minutes.     Elmarie Shiley, MD [facility] Pager [phone]  If 7PM-7AM, please contact night-coverage [email or web address] Password [email or web address] 10/28/2015, 8:39 AM

## 2015-10-28 NOTE — Consult Note (Signed)
[place]  [address] [place], [place] [address] Phone: [phone] FAX: [phone]     [name]  [birth year] [id number]  CARE TEAM:  PCP: No primary care provider on file.  Outpatient Care Team: No care team member to display  Inpatient Treatment Team: Treatment Team: Attending Provider: Elmarie Shiley, MD; Registered Nurse: Lupita Shutter., RN; Rounding Team: Fatima Blank, MD; Registered Nurse: Lottie Dawson, RN; Consulting Physician: Nolon Nations, MD  This patient is a 55 y.o.male who presents today for surgical evaluation at the request of Dr Niel Hummer.   Reason for evaluation: Abdominal pain, hernia, fever  Pleasant obese smoking male with history of cirrhosis.  Followed by Dr. Kathi Der kind with [facility] gastroenterology.  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  Diagnosed with urinary tract infection and found to have prostate cancer.  Medically cleared.  Cleared by gastrology/hepatology.  Underwent robotic prostatectomy in April 2017.  This was complicated by periumbilical incisional dehiscence requiring reclosure with Novafil sutures.  Eventually recovered.  Has had some iliac fluid collections suspicious for lymphoceles postoperatively.  Patient also has a chronic right calf ulcer/scab from a fall and scratch many years ago that has intermittently drained in the past few years.  Patient had worsening swelling of his right lower extremity for several days.  Worsen.  Also was concerned about swelling at his supraumbilical incision.  Concern for hernia recurrence.  Surgical consultation requested.  Patient notes he had some crampy abdominal pain yesterday but feels better.  No nausea or vomiting.  No diarrhea.  No active drainage from his incision.  He continues to smoke.  He still has an swelling in his right lower extremity.  Ruling out deep venous thrombosis.  My surgical partner  called when the patient was in the emergency room yesterday.  Did not have any perforation or obstruction in abdomen.  Hernia noted but reducible.  There is concern sounded like lower extremity cellulitis.  Concern for pelvic fluid collections most likely lymphoceles.  Recommendation for vascular consultation if needed.  No mention made of the patient recently having prostate surgery and lymphatic node dissection.  Did not feel benefit to general surgical consultation.   Given the patient's complexity, Gen. surgery consultation asked by medicine service.  Urology consultation made as well.   Past Medical History  Diagnosis Date  . Cirrhosis of liver ([place])   . Prostate CA ([place])   . Seminoma [facility])     s/p left orchiectomy and xrt in 2010  . Back pain   . Dysuria   . Elevated PSA   . ED (erectile dysfunction)   . Hypogonadism, testicular   . Premature ejaculation   . Anxiety   . Alcoholism ([place])   . Depression   . Thyroid disease     hyperthyroidism  . Neck fracture ([place])   . Stenosis, cervical spine   . Pneumonia     hx of pneumonia x 2   . Hypothyroidism   . Seizure ([place])     pt states last seizure was many years ago and stopped dilantin in 2015  . Perioperative dehiscence of abdominal wound with evisceration 08/07/2015    Past Surgical History  Procedure Laterality Date  . Prostate surgery    . Appendectomy    . Left orchiectomy    . Back surgery    . Dental surgery    . Leg surgery    .  Neck surgery    . Robot assisted laparoscopic radical prostatectomy N/A 07/30/2015    Procedure: XI ROBOTIC ASSISTED LAPAROSCOPIC RADICAL PROSTATECTOMY LEVEL 2;  Surgeon: Raynelle Bring, MD;  Location: [facility];  Service: Urology;  Laterality: N/A;  . Lymphadenectomy Bilateral 07/30/2015    Procedure: BILATERAL PELVIC LYMPHADENECTOMY;  Surgeon: Raynelle Bring, MD;  Location: [facility];  Service: Urology;  Laterality: Bilateral;  . Wound exploration N/A 08/07/2015    Procedure: EXPLORATORY LAPAROTOMY  WITH WOUND CLOSURE;  Surgeon: Raynelle Bring, MD;  Location: [facility];  Service: Urology;  Laterality: N/A;    Social History   Social History  . Marital Status: Single    Spouse Name: N/A  . Number of Children: N/A  . Years of Education: N/A   Occupational History  . Not on file.   Social History Main Topics  . Smoking status: Current Every Day Smoker -- 1.50 packs/day for 36 years    Types: Cigarettes  . Smokeless tobacco: Never Used  . Alcohol Use: Yes     Comment: quit 2012   . Drug Use: No  . Sexual Activity: Yes   Other Topics Concern  . Not on file   Social History Narrative    Family History  Problem Relation Age of Onset  . Cancer Paternal Uncle     leukemia  . Cancer Father     testicular (per [facility] notes)    Current Facility-Administered Medications  Medication Dose Route Frequency Provider Last Rate Last Dose  . acetaminophen (TYLENOL) tablet 500 mg  500 mg Oral Q6H PRN Gennaro Africa, MD   500 mg at 10/27/15 1922  . clindamycin (CLEOCIN) IVPB 600 mg  600 mg Intravenous Q8H Gennaro Africa, MD 100 mL/hr at 10/28/15 0804 600 mg at 10/28/15 0804  . famotidine (PEPCID) IVPB 20 mg premix  20 mg Intravenous Q12H Gennaro Africa, MD   20 mg at 10/27/15 1709  . levothyroxine (SYNTHROID, LEVOTHROID) tablet 112 mcg  112 mcg Oral QAC breakfast Gennaro Africa, MD   112 mcg at 10/28/15 0804  . morphine 2 MG/ML injection 2 mg  2 mg Intravenous Q3H PRN Gennaro Africa, MD   2 mg at 10/28/15 8937  . nicotine (NICODERM CQ - dosed in mg/24 hours) patch 21 mg  21 mg Transdermal Daily Gennaro Africa, MD   21 mg at 10/27/15 1959  . ondansetron (ZOFRAN) injection 4 mg  4 mg Intravenous Q6H PRN Gennaro Africa, MD         Allergies  Allergen Reactions  . Hydrocodone Nausea And Vomiting  . Motrin [Ibuprofen]     Avoids due to liver cirrhosis.    ROS: Constitutional:  No fevers, chills, sweats.  Weight stable Eyes:  No vision changes, No discharge HENT:  No sore throats, nasal drainage Lymph:  No neck swelling, No bruising easily Pulmonary:  No cough, productive sputum CV: No orthopnea, PND  Patient walks 10 minutes.  No exertional chest/neck/shoulder/arm pain. GI: No personal nor family history of GI/colon cancer, inflammatory bowel disease, irritable bowel syndrome, allergy such as Celiac Sprue, dietary/dairy problems, colitis, ulcers nor gastritis.  No recent sick contacts/gastroenteritis.  No travel outside the country.  No changes in diet. Renal: No UTIs, No hematuria Genital:  No drainage, bleeding, masses Musculoskeletal: No severe joint pain.  Good ROM major joints Skin:  Chronic scab right posterior calf with swelling and intermittent drainage.  No active drainage now.  No other sores or lesions.  No rashes Heme/Lymph:  No easy bleeding.  No swollen lymph nodes Neuro: No focal weakness/numbness.  No seizures Psych: No suicidal ideation.  No hallucinations  BP 131/70 mmHg  Pulse 69  Temp(Src) 100 F (37.8 C) (Oral)  Resp 16  Ht 5' 11" (1.803 m)  Wt 101.152 kg (223 lb)  BMI 31.12 kg/m2  SpO2 100%  Physical Exam: General: Pt awake/alert/oriented x4 in no major acute distress Eyes: PERRL, normal EOM. Sclera nonicteric Neuro: CN II-XII intact w/o focal sensory/motor deficits. Lymph: No head/neck/groin lymphadenopathy Psych:  No delerium/psychosis/paranoia HENT: Normocephalic, Mucus membranes moist.  No thrush Neck: Supple, No tracheal deviation Chest: No pain.  Good respiratory excursion. CV:  Pulses intact.  Regular rhythm Abdomen: Soft, obese.  Nondistended.  Nontender.  5 x 5 cm supraumbilical mass easily reducible to 3 cm supraumbilical ventral hernia.  Moderate diastases recti.  No leak of ascites.  Rest of the port site incision seen closed.  No incarcerated hernias. Normal external genitalia except for left testicle absent.  No inguinal hernias. Ext:  SCDs BLE.  No significant edema.  No cyanosis Skin: No petechiae / purpurea.  No major sores Musculoskeletal:  No severe joint pain.  Good ROM major joints.  Major edema right lower extremity down to the foot.Marland Kitchen  Posterior calf irregular s purplr car with no active drainage.  Mild redness but no strong evidence of cellulitis to this examiner.   Results:   Labs: Results for orders placed or performed during the hospital encounter of 10/27/15 (from the past 48 hour(s))  CBC with Differential     Status: Abnormal   Collection Time: 10/27/15 12:50 PM  Result Value Ref Range   WBC 8.8 4.0 - 10.5 K/uL   RBC 3.54 (L) 4.22 - 5.81 MIL/uL   Hemoglobin 10.8 (L) 13.0 - 17.0 g/dL   HCT 32.2 (L) 39.0 - 52.0 %   MCV 91.0 78.0 - 100.0 fL   MCH 30.5 26.0 - 34.0 pg   MCHC 33.5 30.0 - 36.0 g/dL   RDW 14.1 11.5 - 15.5 %   Platelets 239 150 - 400 K/uL   Neutrophils Relative % 87 %   Neutro Abs 7.6 1.7 - 7.7 K/uL   Lymphocytes Relative 5 %   Lymphs Abs 0.5 (L) 0.7 - 4.0 K/uL   Monocytes Relative 7 %   Monocytes Absolute 0.6 0.1 - 1.0 K/uL   Eosinophils Relative 1 %   Eosinophils Absolute 0.1 0.0 - 0.7 K/uL   Basophils Relative 0 %   Basophils Absolute 0.0 0.0 - 0.1 K/uL  Comprehensive metabolic panel     Status: Abnormal   Collection Time: 10/27/15 12:50 PM  Result Value Ref Range   Sodium 130 (L) 135 - 145 mmol/L   Potassium 3.5 3.5 - 5.1 mmol/L   Chloride 98 (L) 101 - 111 mmol/L   CO2 24 22 - 32 mmol/L   Glucose, Bld 107 (H) 65 - 99 mg/dL   BUN 6 6 - 20 mg/dL   Creatinine, Ser 0.75 0.61 - 1.24 mg/dL   Calcium 8.8 (L) 8.9 - 10.3 mg/dL   Total Protein 7.7 6.5 - 8.1 g/dL   Albumin 3.6 3.5 - 5.0 g/dL   AST 23 15 - 41 U/L   ALT 19 17 - 63 U/L   Alkaline Phosphatase 152 (H) 38 - 126 U/L   Total Bilirubin 0.9 0.3 - 1.2 mg/dL   GFR calc non Af Amer >60 >60 mL/min   GFR calc Af Amer >60 >60 mL/min    Comment: (NOTE)  The eGFR has been calculated using the CKD EPI equation. This calculation has not been validated in all clinical situations. eGFR's persistently <60 mL/min signify possible Chronic  Kidney Disease.    Anion gap 8 5 - 15  Blood culture (routine x 2)     Status: None (Preliminary result)   Collection Time: 10/27/15 12:50 PM  Result Value Ref Range   Specimen Description BLOOD LEFT ARM    Special Requests      BOTTLES DRAWN AEROBIC AND ANAEROBIC 10CC Performed at [facility]    Culture PENDING    Report Status PENDING   I-Stat Chem 8, ED     Status: Abnormal   Collection Time: 10/27/15  1:03 PM  Result Value Ref Range   Sodium 132 (L) 135 - 145 mmol/L   Potassium 3.5 3.5 - 5.1 mmol/L   Chloride 96 (L) 101 - 111 mmol/L   BUN 4 (L) 6 - 20 mg/dL   Creatinine, Ser 0.80 0.61 - 1.24 mg/dL   Glucose, Bld 106 (H) 65 - 99 mg/dL   Calcium, Ion 1.07 (L) 1.13 - 1.30 mmol/L   TCO2 24 0 - 100 mmol/L   Hemoglobin 11.9 (L) 13.0 - 17.0 g/dL   HCT 35.0 (L) 39.0 - 52.0 %  I-Stat CG4 Lactic Acid, ED     Status: None   Collection Time: 10/27/15  1:03 PM  Result Value Ref Range   Lactic Acid, Venous 0.83 0.5 - 1.9 mmol/L  Blood culture (routine x 2)     Status: None (Preliminary result)   Collection Time: 10/27/15  1:05 PM  Result Value Ref Range   Specimen Description BLOOD RIGHT ARM RIGHT ARM  5 ML IN [facility] BOTTLE    Special Requests      BOTTLES DRAWN AEROBIC AND ANAEROBIC 5CC Performed at [facility]    Culture PENDING    Report Status PENDING   I-Stat CG4 Lactic Acid, ED     Status: None   Collection Time: 10/27/15  4:36 PM  Result Value Ref Range   Lactic Acid, Venous 0.68 0.5 - 1.9 mmol/L  CBC     Status: Abnormal   Collection Time: 10/28/15  8:10 AM  Result Value Ref Range   WBC 6.4 4.0 - 10.5 K/uL   RBC 3.11 (L) 4.22 - 5.81 MIL/uL   Hemoglobin 9.7 (L) 13.0 - 17.0 g/dL   HCT 28.5 (L) 39.0 - 52.0 %   MCV 91.6 78.0 - 100.0 fL   MCH 31.2 26.0 - 34.0 pg   MCHC 34.0 30.0 - 36.0 g/dL   RDW 14.5 11.5 - 15.5 %   Platelets 201 150 - 400 K/uL    Imaging / Studies: Ct Abdomen Pelvis W Contrast  10/27/2015  CLINICAL DATA:  Abdominal pain and right  leg swelling EXAM: CT ABDOMEN AND PELVIS WITH CONTRAST TECHNIQUE: Multidetector CT imaging of the abdomen and pelvis was performed using the standard protocol following bolus administration of intravenous contrast. CONTRAST:  125m ISOVUE-300 IOPAMIDOL (ISOVUE-300) INJECTION 61% COMPARISON:  08/09/2015 FINDINGS: Lower chest:  No acute findings. Hepatobiliary: Nodularity is noted consistent with underlying cirrhotic change. No definitive mass lesion is noted. The gallbladder is within normal limits. Pancreas: No mass, inflammatory changes, or other significant abnormality. Spleen: Single calcified granuloma is noted within. Adrenals/Urinary Tract: No masses identified. No evidence of hydronephrosis. Bladder is well distended. Stomach/Bowel: The appendix is not well visualized. Large and small bowel as well as stomach are within normal limits. Vascular/Lymphatic:  Aortoiliac calcifications are identified. No aneurysmal dilatation is seen Reproductive: Status post prostatectomy Other: In the right lateral pelvic wall there is a bilobed fluid collection. The larger component measures 5.3 cm in greatest dimension. The smaller component measures 3.4 cm in greatest dimension. This has increased from a maximum dimension of 4.5 cm on the prior exam it is consistent with a developing lymphocele. The previously seen ascites has decreased in the interval. Small lymphocele is also noted on the left lateral pelvic wall adjacent to the external iliac artery best seen on image number 60. This is stable in appearance from the prior exam. An anterior abdominal wall hernia is noted just above the umbilicus. This is increased in size in contains only omental fat. Musculoskeletal: Postsurgical changes are noted in the lumbar spine. IMPRESSION: Enlarging anterior abdominal wall hernia just above the umbilicus. Decreasing ascites. Small fluid collections adjacent to the external iliac arteries bilaterally consistent with postoperative  lymphoceles. The 1 on the right has increased in size when compared with the prior exam. This may be causing some compression upon the external iliac vein leading to the lower extremity edema. Cirrhotic change of the liver stable from the previous exam. Electronically Signed   By: Inez Catalina M.D.   On: 10/27/2015 14:04    Medications / Allergies: per chart  Antibiotics: Anti-infectives    Start     Dose/Rate Route Frequency Ordered Stop   10/27/15 1615  clindamycin (CLEOCIN) IVPB 600 mg     600 mg 100 mL/hr over 30 Minutes Intravenous Every 8 hours 10/27/15 1539        Assessment  [name]  55 y.o. 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  Right lower extremity edema and iliac fluid collections most likely related to chronic calf wound, retroperitoneal radiation, bilateral lymph node dissection.  Plan:  Standard of care would be repair of his recurrent incisional hernia at some point.  However he is very high risk for recurrence given his many risk factors (cirrhosis, smoking, obesity, diastases recti).  Therefore, will require underlay repair with mesh.  Most likely laparoscopically.   He is not incarcerated.  This is not an emergency.  With a concern of active infection, poor idea to try and reattempt hernia repair this admission.  He has failed 2 primary repairs, so I do not see any advantage to proceeding now.  Evaluate cirrhosis.  Try and minimize ascites.  Defer to medicine and gastroenterology.  Do not know how compliant he is been on his spironolactone and diuretics.  Make sure that that is under as good control as possible.  He is abstinent on alcohol now, so that is a good start.   Check liver function tests, pro time, ammonia.  Make sure his cirrhosis MELD or Child's score has not worsened  after all his recent events.  Certainly does not look encephalopathic to me.  Chronic right lower extremity edema most likely from poor lymphatic return due to prior radiation therapy for seminoma and need for lymph node dissection for prostate CA.  Vulnerable for cellulitis/lymphangitis with chronic posterior calf wound.  Defer to urology.  May benefit from vascular consultation but I suspect it is going to be nonoperative management.  Elevation.  Diuretics.  Chronic leg compression.  Do not  know if there is any benefit to draining of these iliac fluid collections, but I would hesitate to proceeding without their input.  Hold off on any surgery in the evidence of cellulitis/lymphangitis.  Quit smoking.  I strongly noted to him that his failure rate with hernia surgery would be very high if he continues to smoke.  Would hold off on any hernia repair if he continues to smoke.  He has many risk factors.  This is one that he can help eliminate.  I will not repair his hernia until he quits smoking > 3 weeks.  -VTE prophylaxis- SCDs, etc  -mobilize as tolerated to help recovery  The patient is stable.  There is no evidence of peritonitis, acute abdomen, nor shock.  There is no strong evidence of failure of improvement nor decline with current non-operative management.  There is no need for surgery at the present moment.  We will be available as needed.  Should the patient quit smoking, get his cirrhosis under control, and is still interested in hernia surgery; can be set up to consider elective repair with office visit in the next few weeks   [name], M.D., F.A.C.S. Gastrointestinal and Minimally Invasive Surgery [facility] [address] [place], [place] [address] [phone] Main / Paging   10/28/2015  Note: Portions of this report may have been transcribed using voice recognition software. Every effort was made to ensure accuracy; however,  inadvertent computerized transcription errors may be present.   Any transcriptional errors that result from this process are unintentional.

## 2015-10-28 NOTE — Consult Note (Signed)
Urology Consult  CC: Referring physician: Dr. Rudolpho Sevin Reason for referral: Abdominal pain, pelvic lymphocele and RLE edema  Impression/Assessment: 1. History of radical retropubic prostatectomy - He was due for follow-up with Dr. Alinda Money later this month for a follow-up PSA.  2. Supraumbilical midline abdominal wall hernia - this appears to have developed slowly and was somewhat tender yesterday indicating that it is symptomatic intermittently and would likely benefit from repair. He is not having any pain in that area currently and the hernia contains omentum only with no signs of incarceration/strangulation at this time.  3. Bilateral lymphoceles - he has a small lymphocele on the left-hand side that is unchanged over the past 2-1/2 months but his right pelvic lymphocele appears to have increased in size slightly. While this certainly could be causing some compression of the venous drainage from his right lower extremity it appears, from his history, that his right lower extremity edema acutely worsened 24 hours ago. While drainage of this lymphocele is considered the fact that it is bilobar would indicate that there may not be communication between the 2 areas of fluid accumulation. This might make drainage a little bit more difficult and also raises the question as to whether drainage could potentially prolong the lymphocele by removing the tamponade effect that it is causing. It has been present for some time and I will therefore discuss this further with Dr. Alinda Money and allow him to make a final determination as to how he would like to manage this.   Plan:  1. Treat right lower extremity cellulitis. 2. Obtain PSA. 3. No immediate indication for intervention regarding his umbilical hernia or lymphoceles. 4. Will follow and assist.    History of Present Illness: [name] is a 55 year old male who underwent a laparoscopic radical retropubic prostatectomy in 4/17. He then developed some  drainage from his incision just above the umbilicus and eventually underwent exploration and closure of a small fascial defect. Continued to have some drainage which was managed conservatively with a binder and a collection device and eventually the drainage stopped. He was scheduled to follow-up with Dr. Alinda Money later this month for recheck of his PSA and determination of further therapy of his umbilical hernia.  He was admitted to the hospital with an infection in the calf of his right lower extremity. He said several years ago he fell and lacerated the area which was closed primarily but he said he has always had some itching and firmness in this area. He developed cellulitis and has been admitted for that but also indicates that yesterday he had marked swelling of his right lower extremity all the way up to his hip region. This was not associated with any pain. In addition he noted some tenderness in the area of his supraumbilical hernia and he said at that time he found it somewhat difficult to reduce but the pain has now resolved in that area.  A CT scan has been obtained which has revealed a small lymphocele involving the location where he underwent his pelvic lymphadenectomy on the left-hand side which really has not changed from previous imaging in 4/17. At that time he did have a bilobar lymphocele in the location of his right pelvic lymphadenectomy above and below the external iliac artery that by CT scan now has increased in size slightly. His CT scan also reveals his supraumbilical hernia which contains omentum but no bowel.  Past Medical History  Diagnosis Date  . Cirrhosis of liver ([place])   .  Prostate CA ([place])   . [facility])     s/p left orchiectomy and xrt in 2010  . Back pain   . Dysuria   . Elevated PSA   . ED (erectile dysfunction)   . Hypogonadism, testicular   . Premature ejaculation   . Anxiety   . Alcoholism ([place])   . Depression   . Thyroid disease     hyperthyroidism   . Neck fracture ([place])   . Stenosis, cervical spine   . Pneumonia     hx of pneumonia x 2   . Hypothyroidism   . Seizure ([place])     pt states last seizure was many years ago and stopped dilantin in 2015  . Perioperative dehiscence of abdominal wound with evisceration 08/07/2015  . URI 03/01/2010    Qualifier: Diagnosis of  By: Amil Amen MD, Benjamine Mola     Past Surgical History  Procedure Laterality Date  . Prostate surgery    . Appendectomy    . Orchiectomy Left 2010  . Back surgery    . Dental surgery    . Leg surgery    . Neck surgery    . Robot assisted laparoscopic radical prostatectomy N/A 07/30/2015    Procedure: XI ROBOTIC ASSISTED LAPAROSCOPIC RADICAL PROSTATECTOMY LEVEL 2;  Surgeon: Raynelle Bring, MD;  Location: [facility];  Service: Urology;  Laterality: N/A;  . Lymphadenectomy Bilateral 07/30/2015    Procedure: BILATERAL PELVIC LYMPHADENECTOMY;  Surgeon: Raynelle Bring, MD;  Location: [facility];  Service: Urology;  Laterality: Bilateral;  . Wound exploration N/A 08/07/2015    Procedure: EXPLORATORY LAPAROTOMY WITH WOUND CLOSURE;  Surgeon: Raynelle Bring, MD;  Location: [facility];  Service: Urology;  Laterality: N/A;    Medications:  Prior to Admission:  Prescriptions prior to admission  Medication Sig Dispense Refill Last Dose  . furosemide (LASIX) 40 MG tablet Take 40 mg by mouth daily.   2 months  . levothyroxine (SYNTHROID, LEVOTHROID) 112 MCG tablet Take 112 mcg by mouth every morning.   10/27/2015 at Unknown time  . potassium chloride (MICRO-K) 10 MEQ CR capsule Take 10 mEq by mouth every morning.   10/26/2015 at Unknown time  . spironolactone (ALDACTONE) 50 MG tablet Take 50 mg by mouth 2 (two) times daily.   2 months at Unknown time  . HYDROcodone-acetaminophen (NORCO/VICODIN) 5-325 MG tablet Take 1 tablet by mouth every 6 (six) hours as needed for moderate pain or severe pain. (Patient not taking: Reported on 10/27/2015) 15 tablet 0 Not Taking at Unknown time  . oxyCODONE (ROXICODONE) 5  MG immediate release tablet Take 1 tablet (5 mg total) by mouth every 4 (four) hours as needed for severe pain. May take 2 tablets every 6 hours for pain (Patient not taking: Reported on 08/07/2015) 30 tablet 0 Completed Course at Unknown time  . oxyCODONE (ROXICODONE) 5 MG immediate release tablet Take 1 tablet (5 mg total) by mouth every 4 (four) hours as needed for severe pain. (Patient not taking: Reported on 10/27/2015) 20 tablet 0 Completed Course at Unknown time  . sulfamethoxazole-trimethoprim (BACTRIM DS,SEPTRA DS) 800-160 MG tablet Take 1 tablet by mouth 2 (two) times daily. Start the day prior to foley removal appointment (Patient not taking: Reported on 10/27/2015) 6 tablet 0 Not Taking at Unknown time   Scheduled: . ampicillin-sulbactam (UNASYN) IV  3 g Intravenous Q6H  . famotidine (PEPCID) IV  20 mg Intravenous Q12H  . levothyroxine  112 mcg Oral QAC breakfast  . nicotine  21 mg Transdermal  Daily   Continuous:   Allergies:  Allergies  Allergen Reactions  . Hydrocodone Nausea And Vomiting  . Motrin [Ibuprofen]     Avoids due to liver cirrhosis.    Family History  Problem Relation Age of Onset  . Cancer Paternal Uncle     leukemia  . Cancer Father     testicular (per [facility] notes)    Social History:  reports that he has been smoking Cigarettes.  He has a 54 pack-year smoking history. He has never used smokeless tobacco. He reports that he drinks alcohol. He reports that he does not use illicit drugs.  Review of Systems (10 point): Pertinent items are noted in HPI. A comprehensive review of systems was negative except as noted above.  Physical Exam:  Vital signs in last 24 hours: Temp:  [98.8 F (37.1 C)-102.7 F (39.3 C)] 100 F (37.8 C) (07/02 0756) Pulse Rate:  [69-83] 69 (07/02 0545) Resp:  [16-18] 16 (07/02 0545) BP: (112-165)/(43-82) 131/70 mmHg (07/02 0545) SpO2:  [96 %-100 %] 100 % (07/02 0545) Weight:  [101.152 kg (223 lb)] 101.152 kg (223 lb) (07/01  1516) General appearance: alert and appears stated age Head: Normocephalic, without obvious abnormality, atraumatic Eyes: conjunctivae/corneas clear. EOM's intact.  Oropharynx: moist mucous membranes Neck: supple, symmetrical, trachea midline Resp: normal respiratory effort Cardio: regular rate and rhythm Back: symmetric, no curvature. ROM normal. No CVA tenderness. GI: soft, non-tender; bowel sounds normal; no masses,  no organomegaly. The laparoscopic port sites are all healed. There is a slight bulge above the umbilicus in the midline that is nontender and fat can be felt and is reducible. Male genitalia: penis: normal male phallus with no lesions or discharge.Testes: bilaterally descended with no masses or tenderness. no hernias Extremities: extremities normal, there appears to be a small scab on the right lower extremity anteriorly and also an area on the posterior aspect of the right lower extremity in the center of the calf that does have some mild erythema surrounding it. There does not appear to be any discharge. He does have 1+ pitting edema of the right lower extremity but does not have significant pitting edema above the knee on that side. There is no tenderness. Skin: Skin color normal. No visible rashes or lesions Neurologic: Grossly normal  Laboratory Data:   Recent Labs  10/27/15 1250 10/27/15 1303 10/28/15 0810  WBC 8.8  --  6.4  HGB 10.8* 11.9* 9.7*  HCT 32.2* 35.0* 28.5*   BMET  Recent Labs  10/27/15 1250 10/27/15 1303 10/28/15 0810  NA 130* 132* 132*  K 3.5 3.5 3.4*  CL 98* 96* 100*  CO2 24  --  24  GLUCOSE 107* 106* 173*  BUN 6 4* 9  CREATININE 0.75 0.80 1.11  CALCIUM 8.8*  --  8.1*   No results for input(s): LABPT, INR in the last 72 hours. No results for input(s): LABURIN in the last 72 hours. Results for orders placed or performed during the hospital encounter of 10/27/15  Blood culture (routine x 2)     Status: None (Preliminary result)    Collection Time: 10/27/15 12:50 PM  Result Value Ref Range Status   Specimen Description BLOOD LEFT ARM  Final   Special Requests   Final    BOTTLES DRAWN AEROBIC AND ANAEROBIC 10CC Performed at [facility]    Culture PENDING  Incomplete   Report Status PENDING  Incomplete  Blood culture (routine x 2)     Status: None (Preliminary  result)   Collection Time: 10/27/15  1:05 PM  Result Value Ref Range Status   Specimen Description BLOOD RIGHT ARM RIGHT ARM  5 ML IN [facility] BOTTLE  Final   Special Requests   Final    BOTTLES DRAWN AEROBIC AND ANAEROBIC 5CC Performed at [facility]    Culture PENDING  Incomplete   Report Status PENDING  Incomplete   Creatinine:  Recent Labs  10/27/15 1250 10/27/15 1303 10/28/15 0810  CREATININE 0.75 0.80 1.11    Imaging: Dg Tibia/fibula Right  10/28/2015  CLINICAL DATA:  Right leg swelling and pain for 3 days. No known injury. EXAM: RIGHT TIBIA AND FIBULA - 2 VIEW COMPARISON:  10/28/2013 FINDINGS: There is no evidence of fracture or other focal bone lesions. Diffuse edema is seen throughout the subcutaneous tissues of the right leg. No evidence of soft tissue gas or radiopaque foreign body. Peripheral vascular calcification noted. IMPRESSION: Diffuse subcutaneous soft tissue edema or cellulitis. No osseous abnormality identified. Electronically Signed   By: Earle Gell M.D.   On: 10/28/2015 10:21   Ct Abdomen Pelvis W Contrast  10/27/2015  CLINICAL DATA:  Abdominal pain and right leg swelling EXAM: CT ABDOMEN AND PELVIS WITH CONTRAST TECHNIQUE: Multidetector CT imaging of the abdomen and pelvis was performed using the standard protocol following bolus administration of intravenous contrast. CONTRAST:  193mL ISOVUE-300 IOPAMIDOL (ISOVUE-300) INJECTION 61% COMPARISON:  08/09/2015 FINDINGS: Lower chest:  No acute findings. Hepatobiliary: Nodularity is noted consistent with underlying cirrhotic change. No definitive mass lesion is noted. The  gallbladder is within normal limits. Pancreas: No mass, inflammatory changes, or other significant abnormality. Spleen: Single calcified granuloma is noted within. Adrenals/Urinary Tract: No masses identified. No evidence of hydronephrosis. Bladder is well distended. Stomach/Bowel: The appendix is not well visualized. Large and small bowel as well as stomach are within normal limits. Vascular/Lymphatic: Aortoiliac calcifications are identified. No aneurysmal dilatation is seen Reproductive: Status post prostatectomy Other: In the right lateral pelvic wall there is a bilobed fluid collection. The larger component measures 5.3 cm in greatest dimension. The smaller component measures 3.4 cm in greatest dimension. This has increased from a maximum dimension of 4.5 cm on the prior exam it is consistent with a developing lymphocele. The previously seen ascites has decreased in the interval. Small lymphocele is also noted on the left lateral pelvic wall adjacent to the external iliac artery best seen on image number 60. This is stable in appearance from the prior exam. An anterior abdominal wall hernia is noted just above the umbilicus. This is increased in size in contains only omental fat. Musculoskeletal: Postsurgical changes are noted in the lumbar spine. IMPRESSION: Enlarging anterior abdominal wall hernia just above the umbilicus. Decreasing ascites. Small fluid collections adjacent to the external iliac arteries bilaterally consistent with postoperative lymphoceles. The 1 on the right has increased in size when compared with the prior exam. This may be causing some compression upon the external iliac vein leading to the lower extremity edema. Cirrhotic change of the liver stable from the previous exam. Electronically Signed   By: Inez Catalina M.D.   On: 10/27/2015 14:04   CT scan images as above and from 4/17 were independently reviewed.    Derico Mitton C 10/28/2015, 10:24 AM

## 2015-10-28 NOTE — Progress Notes (Signed)
Pharmacy Antibiotic Note  [name] is a 55 y.o. male admitted on 10/27/2015 with cellulitis and & IAI.  Patient was originally started on Clindamycin for non-purulent cellulitis.  No allergies noted.  He is still spiking fevers today, surgery consulted for possible intra-abdominal infection.  Antibiotic coverage being broadened to Unasyn.  Pharmacy has been consulted for dosing.  10/28/2015:  Febrile (Tm 102.53F)  NCrCl ~ 42ml/min  No leukocytosis (WBC 6.4)  Plan: Unasyn 3gm IV q6h Discontinue Clindamycin as discussed with Dr Tyrell Antonio (MRSA unlikely pathogen in non-purulent cellulitis) Monitor renal function and cx data   Height: 5\' 11"  (180.3 cm) Weight: 223 lb (101.152 kg) IBW/kg (Calculated) : 75.3  Temp (24hrs), Avg:100.7 F (38.2 C), Min:98.8 F (37.1 C), Max:102.7 F (39.3 C)   Recent Labs Lab 10/27/15 1250 10/27/15 1303 10/27/15 1636 10/28/15 0810  WBC 8.8  --   --  6.4  CREATININE 0.75 0.80  --   --   LATICACIDVEN  --  0.83 0.68  --     Estimated Creatinine Clearance: 130.9 mL/min (by C-G formula based on Cr of 0.8).    Allergies  Allergen Reactions  . Hydrocodone Nausea And Vomiting  . Motrin [Ibuprofen]     Avoids due to liver cirrhosis.    Antimicrobials this admission: 7/2 Unasyn>> 7/1 Clindamycin>>7/2  Dose adjustments this admission:  Microbiology results: 7/1 BCx: IP  Thank you for allowing pharmacy to be a part of this patient's care.  Netta Cedars, PharmD, BCPS Pager: [phone] 10/28/2015 9:00 AM

## 2015-10-29 DIAGNOSIS — K432 Incisional hernia without obstruction or gangrene: Secondary | ICD-10-CM

## 2015-10-29 DIAGNOSIS — L03119 Cellulitis of unspecified part of limb: Secondary | ICD-10-CM

## 2015-10-29 DIAGNOSIS — I898 Other specified noninfective disorders of lymphatic vessels and lymph nodes: Secondary | ICD-10-CM

## 2015-10-29 DIAGNOSIS — L02419 Cutaneous abscess of limb, unspecified: Secondary | ICD-10-CM

## 2015-10-29 DIAGNOSIS — K7460 Unspecified cirrhosis of liver: Secondary | ICD-10-CM

## 2015-10-29 DIAGNOSIS — Z720 Tobacco use: Secondary | ICD-10-CM

## 2015-10-29 LAB — HEPATIC FUNCTION PANEL
ALT: 38 U/L (ref 17–63)
AST: 60 U/L — ABNORMAL HIGH (ref 15–41)
Albumin: 2.8 g/dL — ABNORMAL LOW (ref 3.5–5.0)
Alkaline Phosphatase: 226 U/L — ABNORMAL HIGH (ref 38–126)
BILIRUBIN INDIRECT: 0.4 mg/dL (ref 0.3–0.9)
Bilirubin, Direct: 0.3 mg/dL (ref 0.1–0.5)
TOTAL PROTEIN: 6.8 g/dL (ref 6.5–8.1)
Total Bilirubin: 0.7 mg/dL (ref 0.3–1.2)

## 2015-10-29 LAB — BASIC METABOLIC PANEL
ANION GAP: 6 (ref 5–15)
BUN: 11 mg/dL (ref 6–20)
CO2: 27 mmol/L (ref 22–32)
Calcium: 8.4 mg/dL — ABNORMAL LOW (ref 8.9–10.3)
Chloride: 103 mmol/L (ref 101–111)
Creatinine, Ser: 1.05 mg/dL (ref 0.61–1.24)
Glucose, Bld: 122 mg/dL — ABNORMAL HIGH (ref 65–99)
POTASSIUM: 3.5 mmol/L (ref 3.5–5.1)
SODIUM: 136 mmol/L (ref 135–145)

## 2015-10-29 LAB — CBC
HCT: 29.6 % — ABNORMAL LOW (ref 39.0–52.0)
Hemoglobin: 9.8 g/dL — ABNORMAL LOW (ref 13.0–17.0)
MCH: 30.9 pg (ref 26.0–34.0)
MCHC: 33.1 g/dL (ref 30.0–36.0)
MCV: 93.4 fL (ref 78.0–100.0)
PLATELETS: 191 10*3/uL (ref 150–400)
RBC: 3.17 MIL/uL — AB (ref 4.22–5.81)
RDW: 14.5 % (ref 11.5–15.5)
WBC: 5.6 10*3/uL (ref 4.0–10.5)

## 2015-10-29 LAB — PROTIME-INR
INR: 1.31 (ref 0.00–1.49)
Prothrombin Time: 15.9 seconds — ABNORMAL HIGH (ref 11.6–15.2)

## 2015-10-29 LAB — AMMONIA: AMMONIA: 13 umol/L (ref 9–35)

## 2015-10-29 MED ORDER — AMOXICILLIN-POT CLAVULANATE 875-125 MG PO TABS: 1.0000 | ORAL_TABLET | Freq: Two times a day (BID) | ORAL | Status: DC

## 2015-10-29 MED ORDER — ONDANSETRON HCL 4 MG PO TABS: 4.0000 mg | ORAL_TABLET | Freq: Three times a day (TID) | ORAL | Status: DC | PRN

## 2015-10-29 MED ORDER — FAMOTIDINE 20 MG PO TABS: 20.0000 mg | ORAL_TABLET | Freq: Two times a day (BID) | ORAL | Status: DC

## 2015-10-29 MED ORDER — NICOTINE 21 MG/24HR TD PT24: 21.0000 mg | MEDICATED_PATCH | Freq: Every day | TRANSDERMAL | Status: DC

## 2015-10-29 NOTE — Progress Notes (Signed)
Patient ID: [name], male   DOB: [birth year], 55 y.o.   MRN: [id number]                                                                PROGRESS NOTE                                                                                                                                                                                                             Patient Demographics:    [name], is a 55 y.o. male, DOB - 19-Sep-1960, [id number]  Admit date - 10/27/2015   Admitting Physician Gennaro Africa, MD  Outpatient Primary MD for the patient is No primary care provider on file.  LOS - 3  Outpatient Specialists:  [facility]  Chief Complaint  Patient presents with  . Leg Pain  . Nausea       Brief Narrative   Patient is a 55 yo male with history of prostate ca s/p prostatectomy with abdominal surgery complicated by abdominal wall hernia who is here with cc of RLE pain/edema that started couple of days ago. He also continues to have abdominal pain at hernia site above the umbilicus with dry heaves but not vomiting or diarrhea. No fever but he had chills. No other complaints.    Subjective:    [name] today feeling better. Right distal lower ext still slightly swollen.  Hernia still present. but pt denies fever, chills, headache, abd pain, nausea, diarrhea, brbpr, black stool.    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 coli UTI (urinary tract  infection)   Cellulitis   1-Right Lower extremity cellulitis;  Edema better per patient.  Doppler negative for DVT.  CT with lymphocele,  might be compressing external iliac vein.  Afebrile on unasyn  2-Abdominal pain, umbilical hernia Appreciate surgery input.  Pt can follow up as outpatient.   3-Hyponatremia; follow, resolved.  Probably related to cirrhosis.    4-Hepatic Cirrhosis;  No evidence of encephalopathy   5-recent prostatectomy;  CT with lymphocele,  Will await urology input regarding lymphocele     Code Status : FULL CODE  Family Communication  :   Disposition Plan  : home, awaiting urology input on lymphocele,  If no procedure planned then will discharge home on augmentin for cellulitis  Barriers For Discharge :   Consults  :  Surgery, urology  Procedures  :   DVT Prophylaxis  :  Heparin - SCDs   Lab Results  Component Value Date   PLT 191 10/29/2015    Antibiotics  :  Unasyn  Anti-infectives    Start     Dose/Rate Route Frequency Ordered Stop   10/29/15 0000  amoxicillin-clavulanate (AUGMENTIN) 875-125 MG tablet     1 tablet Oral 2 times daily 10/29/15 0754     10/28/15 1000  Ampicillin-Sulbactam (UNASYN) 3 g in sodium chloride 0.9 % 100 mL IVPB     3 g 100 mL/hr over 60 Minutes Intravenous Every 6 hours 10/28/15 0918     10/27/15 1615  clindamycin (CLEOCIN) IVPB 600 mg  Status:  Discontinued     600 mg 100 mL/hr over 30 Minutes Intravenous Every 8 hours 10/27/15 1539 10/28/15 0920        Objective:   Filed Vitals:   10/28/15 1030 10/28/15 1506 10/28/15 2039 10/29/15 0436  BP:  116/73 114/61 123/69  Pulse:  64 57 54  Temp: 98.9 F (37.2 C) 97.9 F (36.6 C) 99.8 F (37.7 C) 98.6 F (37 C)  TempSrc: Oral Oral Oral Oral  Resp:  18 16 16   Height:      Weight:      SpO2:  98% 99% 98%    Wt Readings from Last 3 Encounters:  10/27/15 101.152 kg (223 lb)  08/09/15 102 kg (224 lb 13.9 oz)  08/07/15 103.329 kg (227 lb 12.8 oz)      Intake/Output Summary (Last 24 hours) at 10/29/15 0758 Last data filed at 10/28/15 1821  Gross per 24 hour  Intake    480 ml  Output      0 ml  Net    480 ml     Physical Exam  Awake Alert, Oriented X 3, No new F.N deficits, Normal affect [place].AT,PERRAL Supple Neck,No JVD, No cervical lymphadenopathy appriciated.  Symmetrical Chest wall movement, Good air movement bilaterally, CTAB RRR,No Gallops,Rubs or new Murmurs, No Parasternal Heave +ve B.Sounds, Abd Soft, slightly distended, + umbilical hernia 4cm just above the umbilicus.  No tenderness, No organomegaly appriciated, No rebound - guarding or rigidity. No Cyanosis, Clubbing. No new Rash or bruise.  Slight edema 1+ of the right distal lower ext  Slight healed skin ulcer on the back of the right distal lower ext  0.8cm round.     Data Review:    CBC  Recent Labs Lab 10/27/15 1250 10/27/15 1303 10/28/15 0810 10/29/15 0421  WBC 8.8  --  6.4 5.6  HGB 10.8* 11.9* 9.7* 9.8*  HCT 32.2* 35.0* 28.5* 29.6*  PLT 239  --  201 191  MCV 91.0  --  91.6 93.4  MCH 30.5  --  31.2 30.9  MCHC 33.5  --  34.0 33.1  RDW 14.1  --  14.5 14.5  LYMPHSABS 0.5*  --   --   --   MONOABS 0.6  --   --   --   EOSABS 0.1  --   --   --   BASOSABS 0.0  --   --   --     Chemistries   Recent Labs Lab 10/27/15 1250 10/27/15 1303 10/28/15 0810 10/29/15 0421  NA 130* 132* 132* 136  K 3.5 3.5 3.4* 3.5  CL 98* 96* 100* 103  CO2 24  --  24 27  GLUCOSE 107* 106* 173* 122*  BUN 6 4* 9 11  CREATININE 0.75 0.80 1.11 1.05  CALCIUM 8.8*  --  8.1* 8.4*  AST 23  --   --  60*  ALT 19  --   --  38  ALKPHOS 152*  --   --  226*  BILITOT 0.9  --   --  0.7   ------------------------------------------------------------------------------------------------------------------ No results for input(s): CHOL, HDL, LDLCALC, TRIG, CHOLHDL, LDLDIRECT in the last 72 hours.  Lab Results  Component Value Date   HGBA1C  04/13/2010    5.2 (NOTE)                                                                        According to the ADA Clinical Practice Recommendations for 2011, when HbA1c is used as a screening test:   >=6.5%   Diagnostic of Diabetes Mellitus           (if abnormal result  is confirmed)  5.7-6.4%   Increased risk of developing Diabetes Mellitus  References:Diagnosis and Classification of Diabetes Mellitus,Diabetes Care,2011,34(Suppl 1):S62-S69 and Standards of Medical Care in         Diabetes - 2011,Diabetes [id number]  (Suppl 1):S11-S61.   ------------------------------------------------------------------------------------------------------------------ No results for input(s): TSH, T4TOTAL, T3FREE, THYROIDAB in the last 72 hours.  Invalid input(s): FREET3 ------------------------------------------------------------------------------------------------------------------ No results for input(s): VITAMINB12, FOLATE, FERRITIN, TIBC, IRON, RETICCTPCT in the last 72 hours.  Coagulation profile  Recent Labs Lab 10/29/15 0421  INR 1.31    No results for input(s): DDIMER in the last 72 hours.  Cardiac Enzymes No results for input(s): CKMB, TROPONINI, MYOGLOBIN in the last 168 hours.  Invalid input(s): CK ------------------------------------------------------------------------------------------------------------------ No results found for: BNP  Inpatient Medications  Scheduled Meds: . ampicillin-sulbactam (UNASYN) IV  3 g Intravenous Q6H  . famotidine (PEPCID) IV  20 mg Intravenous Q12H  . heparin subcutaneous  5,000 Units Subcutaneous Q8H  . levothyroxine  112 mcg Oral QAC breakfast  . nicotine  21 mg Transdermal Daily   Continuous Infusions:  PRN Meds:.acetaminophen, morphine injection, ondansetron (ZOFRAN) IV  Micro Results Recent Results (from the past 240 hour(s))  Blood culture (routine x 2)     Status: None (Preliminary result)   Collection Time: 10/27/15 12:50 PM  Result Value Ref Range Status   Specimen  Description BLOOD LEFT ARM  Final   Special Requests BOTTLES DRAWN AEROBIC AND ANAEROBIC 10CC  Final   Culture   Final    NO GROWTH 1 DAY Performed at [facility]    Report Status PENDING  Incomplete  Blood culture (routine x 2)     Status: None (Preliminary result)   Collection Time: 10/27/15  1:05 PM  Result Value Ref Range Status   Specimen Description BLOOD RIGHT ARM RIGHT ARM  5 ML IN [facility] BOTTLE  Final   Special Requests BOTTLES DRAWN AEROBIC AND ANAEROBIC 5CC  Final   Culture   Final    NO GROWTH 1 DAY Performed at [facility]    Report Status PENDING  Incomplete    Radiology Reports Dg Tibia/fibula Right  10/28/2015  CLINICAL DATA:  Right leg swelling and pain for 3 days. No known injury. EXAM: RIGHT TIBIA AND FIBULA - 2 VIEW COMPARISON:  10/28/2013 FINDINGS: There is no evidence of fracture or other focal bone lesions. Diffuse edema is seen throughout the subcutaneous tissues of the right leg. No evidence of soft tissue gas or radiopaque foreign body. Peripheral vascular calcification noted. IMPRESSION: Diffuse subcutaneous soft tissue edema or cellulitis. No osseous abnormality identified. Electronically Signed   By: Earle Gell M.D.   On: 10/28/2015 10:21   Ct Abdomen Pelvis W Contrast  10/27/2015  CLINICAL DATA:  Abdominal pain and right leg swelling EXAM: CT ABDOMEN AND PELVIS WITH CONTRAST TECHNIQUE: Multidetector CT imaging of the abdomen and pelvis was performed using the standard protocol following bolus administration of intravenous contrast. CONTRAST:  187mL ISOVUE-300 IOPAMIDOL (ISOVUE-300) INJECTION 61% COMPARISON:  08/09/2015 FINDINGS: Lower chest:  No acute findings. Hepatobiliary: Nodularity is noted consistent with underlying cirrhotic change. No definitive mass lesion is noted. The gallbladder is within normal limits. Pancreas: No mass, inflammatory changes, or other significant abnormality. Spleen: Single calcified granuloma is noted within.  Adrenals/Urinary Tract: No masses identified. No evidence of hydronephrosis. Bladder is well distended. Stomach/Bowel: The appendix is not well visualized. Large and small bowel as well as stomach are within normal limits. Vascular/Lymphatic: Aortoiliac calcifications are identified. No aneurysmal dilatation is seen Reproductive: Status post prostatectomy Other: In the right lateral pelvic wall there is a bilobed fluid collection. The larger component measures 5.3 cm in greatest dimension. The smaller component measures 3.4 cm in greatest dimension. This has increased from a maximum dimension of 4.5 cm on the prior exam it is consistent with a developing lymphocele. The previously seen ascites has decreased in the interval. Small lymphocele is also noted on the left lateral pelvic wall adjacent to the external iliac artery best seen on image number 60. This is stable in appearance from the prior exam. An anterior abdominal wall hernia is noted just above the umbilicus. This is increased in size in contains only omental fat. Musculoskeletal: Postsurgical changes are noted in the lumbar spine. IMPRESSION: Enlarging anterior abdominal wall hernia just above the umbilicus. Decreasing ascites. Small fluid collections adjacent to the external iliac arteries bilaterally consistent with postoperative lymphoceles. The 1 on the right has increased in size when compared with the prior exam. This may be causing some compression upon the external iliac vein leading to the lower extremity edema. Cirrhotic change of the liver stable from the previous exam. Electronically Signed   By: Inez Catalina M.D.   On: 10/27/2015 14:04    Time Spent in minutes  30   Jani Gravel M.D on 10/29/2015 at 7:58 AM  Between 7am to 7pm - Pager - [phone]  After 7pm go to [email or web address] - password [facility]  [facility] -  Office  [phone]

## 2015-10-29 NOTE — Discharge Summary (Signed)
[name], is a 55 y.o. male  DOB 10-Sep-1960  MRN [id number].  Admission date:  10/27/2015  Admitting Physician  Gennaro Africa, MD  Discharge Date:  10/29/2015   Primary MD  No primary care provider on file.  Pt states that he has a PCP in [place], [place] and will follow up with him.   Recommendations for primary care physician for things to follow:   Cellulitis Augmentin 875mg  po bid x 10 days  Lymphocele Prostate cancer  Please follow up with Dr. Raynelle Bring in 11/07/2015 as he is scheduled  Umbilical hernia Please make an appointment to follow up with Dr. Michael Boston in 2-4 weeks,   Tobacco use Pt counselled on smoking cessation esp as hernia repair will not heal well w tobacco use  Anemia Please repeat cbc in 2-4 weeks  Hx of Cirrhosis:  Avoid alcohol  Chronic RLE pain Pt requesting medication but is getting pain medication from his pcp already and will get  Admission Diagnosis  Cellulitis and abscess of leg [L02.419, L03.119] Pain of right lower extremity [M79.604]   Discharge Diagnosis  Cellulitis and abscess of leg [L02.419, L03.119] Pain of right lower extremity [M79.604]    Principal Problem:   Cellulitis and abscess of leg Active Problems:   ABUSE, ALCOHOL, IN REMISSION   Hepatic cirrhosis ([place])   Malignant neoplasm of prostate s/p robotic prostatectomy 07/30/2015   Dehiscence of surgical wound s/p re-repair 08/07/2015   Anxiety   Seminoma of left testis s/p orchiectomy 2010   Recurrent ventral incisional hernia   Lymphocele, left iliac, 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 coli UTI (urinary tract infection)   Cellulitis      Past Medical History  Diagnosis Date  . Cirrhosis of liver ([place])   . Prostate CA ([place])   . Seminoma  [facility])     s/p left orchiectomy and xrt in 2010  . Back pain   . Dysuria   . Elevated PSA   . ED (erectile dysfunction)   . Hypogonadism, testicular   . Premature ejaculation   . Anxiety   . Alcoholism ([place])   . Depression   . Thyroid disease     hyperthyroidism  . Neck fracture ([place])   . Stenosis, cervical spine   . Pneumonia     hx of pneumonia x 2   . Hypothyroidism   . Seizure ([place])     pt states last seizure was many years ago and stopped dilantin in 2015  . Perioperative dehiscence of abdominal wound with evisceration 08/07/2015  . URI 03/01/2010    Qualifier: Diagnosis of  By: Amil Amen MD, Benjamine Mola    . E. coli UTI (urinary tract infection) 10/28/2015    Past Surgical History  Procedure Laterality Date  . Prostate surgery    . Appendectomy    . Orchiectomy  Left 2010  . Back surgery    . Dental surgery    . Leg surgery    . Neck surgery    . Robot assisted laparoscopic radical prostatectomy N/A 07/30/2015    Procedure: XI ROBOTIC ASSISTED LAPAROSCOPIC RADICAL PROSTATECTOMY LEVEL 2;  Surgeon: Raynelle Bring, MD;  Location: [facility];  Service: Urology;  Laterality: N/A;  . Lymphadenectomy Bilateral 07/30/2015    Procedure: BILATERAL PELVIC LYMPHADENECTOMY;  Surgeon: Raynelle Bring, MD;  Location: [facility];  Service: Urology;  Laterality: Bilateral;  . Wound exploration N/A 08/07/2015    Procedure: EXPLORATORY LAPAROTOMY WITH WOUND CLOSURE;  Surgeon: Raynelle Bring, MD;  Location: [facility];  Service: Urology;  Laterality: N/A;       HPI  from the history and physical done on the day of admission:    55 yo male with history of prostate ca s/p prostatectomy with abdominal surgery complicated by abdominal wall hernia who is here with cc of RLE pain/edema that started couple of days ago. He also continues to have abdominal pain at hernia site above the umbilicus with dry heaves but not vomiting or diarrhea. No fever but he had chills. No other complaints.      Hospital Course:      RLE ultrasound was negative for DVT on 10/27/2015.  Pt was started on clindamycin for celluiltis and later transitioned to augmentin due to persistent fever.  Pt has been afebrile for the past 24 hours, and doing well.  Wbc 5.6  (10/29/2015).  Pt seen by surgery who did not think that he would be a candidate for immediate surgical correction of hernia due to cellulitis, and smoking.  Pt can follow up as an outpatient.  In terms of Lymphocele seen on CT scan urology didn't think that this required emergent intervention.  Pt can follow up with Dr. Raynelle Bring regarding this as outpatient per urology recommendations.    Follow UP  Follow-up Information    Follow up with GROSS,STEVEN C., MD. Schedule an appointment as soon as possible for a visit in 2 weeks.   Specialty:  General Surgery   Contact information:   [address] [place] [address] [phone]       Follow up with Dutch Gray, MD. Schedule an appointment as soon as possible for a visit in 2 weeks.   Specialty:  Urology   Contact information:   [place] [address] [phone]        Consults obtained - surgery, urology  Discharge Condition: stable  Diet and Activity recommendation: See Discharge Instructions below  Discharge Instructions    [place] PCP [place]   Discharge Medications       Medication List    STOP taking these medications        HYDROcodone-acetaminophen 5-325 MG tablet  Commonly known as:  NORCO/VICODIN     oxyCODONE 5 MG immediate release tablet  Commonly known as:  ROXICODONE     potassium chloride 10 MEQ CR capsule  Commonly known as:  MICRO-K     sulfamethoxazole-trimethoprim 800-160 MG tablet  Commonly known as:  BACTRIM DS,SEPTRA DS      TAKE these medications        amoxicillin-clavulanate 875-125 MG tablet  Commonly known as:  AUGMENTIN  Take 1 tablet by mouth 2 (two) times daily.     famotidine  20 MG tablet  Commonly known as:  PEPCID  Take 1 tablet (20  mg total) by mouth 2 (two) times daily.     furosemide 40 MG tablet  Commonly known as:  LASIX  Take 40 mg by mouth daily.     levothyroxine 112 MCG tablet  Commonly known as:  SYNTHROID, LEVOTHROID  Take 112 mcg by mouth every morning.     nicotine 21 mg/24hr patch  Commonly known as:  NICODERM CQ - dosed in mg/24 hours  Place 1 patch (21 mg total) onto the skin daily.     ondansetron 4 MG tablet  Commonly known as:  ZOFRAN  Take 1 tablet (4 mg total) by mouth every 8 (eight) hours as needed for nausea or vomiting.     spironolactone 50 MG tablet  Commonly known as:  ALDACTONE  Take 50 mg by mouth 2 (two) times daily.        Major procedures and Radiology Reports - PLEASE review detailed and final reports for all details, in brief -      Dg Tibia/fibula Right  10/28/2015  CLINICAL DATA:  Right leg swelling and pain for 3 days. No known injury. EXAM: RIGHT TIBIA AND FIBULA - 2 VIEW COMPARISON:  10/28/2013 FINDINGS: There is no evidence of fracture or other focal bone lesions. Diffuse edema is seen throughout the subcutaneous tissues of the right leg. No evidence of soft tissue gas or radiopaque foreign body. Peripheral vascular calcification noted. IMPRESSION: Diffuse subcutaneous soft tissue edema or cellulitis. No osseous abnormality identified. Electronically Signed   By: Earle Gell M.D.   On: 10/28/2015 10:21   Ct Abdomen Pelvis W Contrast  10/27/2015  CLINICAL DATA:  Abdominal pain and right leg swelling EXAM: CT ABDOMEN AND PELVIS WITH CONTRAST TECHNIQUE: Multidetector CT imaging of the abdomen and pelvis was performed using the standard protocol following bolus administration of intravenous contrast. CONTRAST:  172mL ISOVUE-300 IOPAMIDOL (ISOVUE-300) INJECTION 61% COMPARISON:  08/09/2015 FINDINGS: Lower chest:  No acute findings. Hepatobiliary: Nodularity is noted consistent with underlying cirrhotic change. No  definitive mass lesion is noted. The gallbladder is within normal limits. Pancreas: No mass, inflammatory changes, or other significant abnormality. Spleen: Single calcified granuloma is noted within. Adrenals/Urinary Tract: No masses identified. No evidence of hydronephrosis. Bladder is well distended. Stomach/Bowel: The appendix is not well visualized. Large and small bowel as well as stomach are within normal limits. Vascular/Lymphatic: Aortoiliac calcifications are identified. No aneurysmal dilatation is seen Reproductive: Status post prostatectomy Other: In the right lateral pelvic wall there is a bilobed fluid collection. The larger component measures 5.3 cm in greatest dimension. The smaller component measures 3.4 cm in greatest dimension. This has increased from a maximum dimension of 4.5 cm on the prior exam it is consistent with a developing lymphocele. The previously seen ascites has decreased in the interval. Small lymphocele is also noted on the left lateral pelvic wall adjacent to the external iliac artery best seen on image number 60. This is stable in appearance from the prior exam. An anterior abdominal wall hernia is noted just above the umbilicus. This is increased in size in contains only omental fat. Musculoskeletal: Postsurgical changes are noted in the lumbar spine. IMPRESSION: Enlarging anterior abdominal wall hernia just above the umbilicus. Decreasing ascites. Small fluid collections adjacent to the external iliac arteries bilaterally consistent with postoperative lymphoceles. The 1 on the right has increased in size when compared with the prior exam. This may be causing some compression upon the external iliac vein leading to the lower extremity edema. Cirrhotic change of the liver  stable from the previous exam. Electronically Signed   By: Inez Catalina M.D.   On: 10/27/2015 14:04    Micro Results     Recent Results (from the past 240 hour(s))  Blood culture (routine x 2)      Status: None (Preliminary result)   Collection Time: 10/27/15 12:50 PM  Result Value Ref Range Status   Specimen Description BLOOD LEFT ARM  Final   Special Requests BOTTLES DRAWN AEROBIC AND ANAEROBIC 10CC  Final   Culture   Final    NO GROWTH 1 DAY Performed at [facility]    Report Status PENDING  Incomplete  Blood culture (routine x 2)     Status: None (Preliminary result)   Collection Time: 10/27/15  1:05 PM  Result Value Ref Range Status   Specimen Description BLOOD RIGHT ARM RIGHT ARM  5 ML IN [facility] BOTTLE  Final   Special Requests BOTTLES DRAWN AEROBIC AND ANAEROBIC 5CC  Final   Culture   Final    NO GROWTH 1 DAY Performed at [facility]    Report Status PENDING  Incomplete       Today   Subjective    [name] today has no headache,no chest abdominal pain,no new weakness tingling or numbness, feels much better wants to go home today.   Objective   Blood pressure 123/69, pulse 54, temperature 98.6 F (37 C), temperature source Oral, resp. rate 16, height 5\' 11"  (1.803 m), weight 101.152 kg (223 lb), SpO2 98 %.   Intake/Output Summary (Last 24 hours) at 10/29/15 0757 Last data filed at 10/28/15 1821  Gross per 24 hour  Intake    480 ml  Output      0 ml  Net    480 ml    Exam Awake Alert, Oriented x 3, No new F.N deficits, Normal affect [place].AT,PERRAL Supple Neck,No JVD, No cervical lymphadenopathy appriciated.  Symmetrical Chest wall movement, Good air movement bilaterally, CTAB RRR,No Gallops,Rubs or new Murmurs, No Parasternal Heave +ve B.Sounds, Abd Soft, Non tender, No organomegaly appriciated, No rebound -guarding or rigidity. No Cyanosis, Clubbing or edema, No new Rash or bruise   Data Review   CBC w Diff: Lab Results  Component Value Date   WBC 5.6 10/29/2015   WBC 8.0 10/28/2013   HGB 9.8* 10/29/2015   HGB 14.6 10/28/2013   HCT 29.6* 10/29/2015   HCT 41.4 10/28/2013   PLT 191 10/29/2015   PLT 148* 10/28/2013    LYMPHOPCT 5 10/27/2015   LYMPHOPCT 18.2 05/12/2012   MONOPCT 7 10/27/2015   MONOPCT 9.0 05/12/2012   EOSPCT 1 10/27/2015   EOSPCT 3.7 05/12/2012   BASOPCT 0 10/27/2015   BASOPCT 0.8 05/12/2012    CMP: Lab Results  Component Value Date   NA 136 10/29/2015   NA 131* 10/28/2013   K 3.5 10/29/2015   K 3.8 10/28/2013   CL 103 10/29/2015   CL 98 10/28/2013   CO2 27 10/29/2015   CO2 27 10/28/2013   BUN 11 10/29/2015   BUN 7 10/28/2013   CREATININE 1.05 10/29/2015   CREATININE 1.28 10/28/2013   PROT 6.8 10/29/2015   PROT 7.7 10/28/2013   ALBUMIN 2.8* 10/29/2015   ALBUMIN 3.5 10/28/2013   BILITOT 0.7 10/29/2015   BILITOT 0.5 10/28/2013   ALKPHOS 226* 10/29/2015   ALKPHOS 121* 10/28/2013   AST 60* 10/29/2015   AST 23 10/28/2013   ALT 38 10/29/2015   ALT 22 10/28/2013  .  Total Time in preparing paper work, data evaluation and todays exam - 16 minutes  Jani Gravel M.D on 10/29/2015 at 7:57 AM  [facility]   Office  [phone]

## 2015-10-29 NOTE — Progress Notes (Signed)
Subjective: Waiting on discharge.  i have follow up information for Dr. Johney Maine in the AVS.    Objective: Vital signs in last 24 hours: Temp:  [97.9 F (36.6 C)-99.8 F (37.7 C)] 98.6 F (37 C) (07/03 0436) Pulse Rate:  [54-64] 54 (07/03 0436) Resp:  [16-18] 16 (07/03 0436) BP: (114-123)/(61-73) 123/69 mmHg (07/03 0436) SpO2:  [98 %-99 %] 98 % (07/03 0436) Last BM Date: 10/27/15 TM 101.9yesterday afebrile today ALK phos 226, up some from 10/27/15 K+ replaced 3.5 Na back to normal.   Intake/Output from previous day: 07/02 0701 - 07/03 0700 In: 480 [P.O.:480] Out: -  Intake/Output this shift:    General appearance: alert, cooperative and no distress GI: nontender supra umbilicalhernia.    Lab Results:   Recent Labs  10/28/15 0810 10/29/15 0421  WBC 6.4 5.6  HGB 9.7* 9.8*  HCT 28.5* 29.6*  PLT 201 191    BMET  Recent Labs  10/28/15 0810 10/29/15 0421  NA 132* 136  K 3.4* 3.5  CL 100* 103  CO2 24 27  GLUCOSE 173* 122*  BUN 9 11  CREATININE 1.11 1.05  CALCIUM 8.1* 8.4*   PT/INR  Recent Labs  10/29/15 0421  LABPROT 15.9*  INR 1.31     Recent Labs Lab 10/27/15 1250 10/29/15 0421  AST 23 60*  ALT 19 38  ALKPHOS 152* 226*  BILITOT 0.9 0.7  PROT 7.7 6.8  ALBUMIN 3.6 2.8*     Lipase     Component Value Date/Time   LIPASE 20 08/09/2015 0037     Studies/Results: Dg Tibia/fibula Right  10/28/2015  CLINICAL DATA:  Right leg swelling and pain for 3 days. No known injury. EXAM: RIGHT TIBIA AND FIBULA - 2 VIEW COMPARISON:  10/28/2013 FINDINGS: There is no evidence of fracture or other focal bone lesions. Diffuse edema is seen throughout the subcutaneous tissues of the right leg. No evidence of soft tissue gas or radiopaque foreign body. Peripheral vascular calcification noted. IMPRESSION: Diffuse subcutaneous soft tissue edema or cellulitis. No osseous abnormality identified. Electronically Signed   By: Earle Gell M.D.   On: 10/28/2015 10:21   Ct  Abdomen Pelvis W Contrast  10/27/2015  CLINICAL DATA:  Abdominal pain and right leg swelling EXAM: CT ABDOMEN AND PELVIS WITH CONTRAST TECHNIQUE: Multidetector CT imaging of the abdomen and pelvis was performed using the standard protocol following bolus administration of intravenous contrast. CONTRAST:  135m ISOVUE-300 IOPAMIDOL (ISOVUE-300) INJECTION 61% COMPARISON:  08/09/2015 FINDINGS: Lower chest:  No acute findings. Hepatobiliary: Nodularity is noted consistent with underlying cirrhotic change. No definitive mass lesion is noted. The gallbladder is within normal limits. Pancreas: No mass, inflammatory changes, or other significant abnormality. Spleen: Single calcified granuloma is noted within. Adrenals/Urinary Tract: No masses identified. No evidence of hydronephrosis. Bladder is well distended. Stomach/Bowel: The appendix is not well visualized. Large and small bowel as well as stomach are within normal limits. Vascular/Lymphatic: Aortoiliac calcifications are identified. No aneurysmal dilatation is seen Reproductive: Status post prostatectomy Other: In the right lateral pelvic wall there is a bilobed fluid collection. The larger component measures 5.3 cm in greatest dimension. The smaller component measures 3.4 cm in greatest dimension. This has increased from a maximum dimension of 4.5 cm on the prior exam it is consistent with a developing lymphocele. The previously seen ascites has decreased in the interval. Small lymphocele is also noted on the left lateral pelvic wall adjacent to the external iliac artery best seen on image number 60.  This is stable in appearance from the prior exam. An anterior abdominal wall hernia is noted just above the umbilicus. This is increased in size in contains only omental fat. Musculoskeletal: Postsurgical changes are noted in the lumbar spine. IMPRESSION: Enlarging anterior abdominal wall hernia just above the umbilicus. Decreasing ascites. Small fluid collections  adjacent to the external iliac arteries bilaterally consistent with postoperative lymphoceles. The 1 on the right has increased in size when compared with the prior exam. This may be causing some compression upon the external iliac vein leading to the lower extremity edema. Cirrhotic change of the liver stable from the previous exam. Electronically Signed   By: Inez Catalina M.D.   On: 10/27/2015 14:04    Medications: . ampicillin-sulbactam (UNASYN) IV  3 g Intravenous Q6H  . famotidine (PEPCID) IV  20 mg Intravenous Q12H  . heparin subcutaneous  5,000 Units Subcutaneous Q8H  . levothyroxine  112 mcg Oral QAC breakfast  . nicotine  21 mg Transdermal Daily    Assessment/Plan Incisional hernia  ROBOTIC ASSISTED LAPAROSCOPIC RADICAL PROSTATECTOMY LEVEL 2; BILATERAL PELVIC LYMPHADENECTOMY, 07/30/15, Dr. Raynelle Bring S/p EXPLORATORY LAPAROTOMY WITH WOUND CLOSURE 08/07/15 Dr. Raynelle Bring Diastases recti Body mass index is 31 Tobacco use Cirrhosis Prostate Cancer Lower leg cellulitis Chronic venous stasis FEN: cardiac diet DVT:  Heparin ID: Unasyn/clindamycin day 2 competed starting day 3 Unasyn   Plan:  Stop smoking, no ETOH, medically stable and then follow up for hernia repair in the future.     LOS: 1 day    [name] 10/29/2015 [phone]

## 2015-10-29 NOTE — Progress Notes (Signed)
Discharge instructions gone over with pt and prescrpitions given to pt. Niece in to take pt home. Pt ambulated out.

## 2015-10-29 NOTE — Discharge Instructions (Signed)
Please follow up with Urology on 11/07/2015 as you are scheduled.  Please follow up with Dr. Johney Maine at [facility] in 2-4 weeks,  Please stop smoking

## 2015-11-01 LAB — CULTURE, BLOOD (ROUTINE X 2)
Culture: NO GROWTH
Culture: NO GROWTH

## 2015-11-12 ENCOUNTER — Other Ambulatory Visit (HOSPITAL_COMMUNITY): Payer: Self-pay | Admitting: Urology

## 2015-11-12 DIAGNOSIS — I898 Other specified noninfective disorders of lymphatic vessels and lymph nodes: Secondary | ICD-10-CM

## 2015-11-26 ENCOUNTER — Other Ambulatory Visit: Payer: Self-pay | Admitting: Radiology

## 2015-11-26 ENCOUNTER — Ambulatory Visit: Payer: Self-pay | Admitting: Surgery

## 2015-11-26 NOTE — H&P (Signed)
[name] 11/26/2015 9:23 AM Location: [facility] Patient #: [id number] DOB: 05/05/1960 Single / Language: [name] / Race: White Male  History of Present Illness Justin Hector MD; 11/26/2015 10:06 AM) The patient is a 55 year old male who presents with an incisional hernia. Note for "Incisional hernia": Patient returns with concerns of recurrent incisional hernia.  Obese smoking cirrhotic male. Underwent robotic prostatectomy in April for cancer. The left wound breakdown and dehiscence. Underwent urgent re-repair. Developed wound breakdown with open wound. Eventually was closing down. Surgical consultation requested. I discussed with him in the hospital about quitting smoking and trying to control cirrhosis to minimize chance of recurrence.  He comes today with his sister. He denies much pain. He's cut down on his smoking from 2 packs a day to less than a half pack a day. Trying these patches. Ling. There was some concern on CT scan of some chronic fluid collections near the right iliac lymphadenectomy site. Concerned that this may be explaining why he has some right lower extremity swelling. His sister talks about he is getting some drainage procedure tomorrow at the hospital. I guess this is interventional radiology trying to aspirate or drain and chronic lymphocytic fluid collection?.  He's hoping to have surgery done to fix the hernia as it is getting larger and somewhat bothersome.   Other Problems [name], CMA; 11/26/2015 9:24 AM) Seizure Disorder Thyroid Disease Umbilical Hernia Repair  Past Surgical History [name], CMA; 11/26/2015 9:24 AM) Foot Surgery Left. Spinal Surgery - Lower Back Spinal Surgery - Neck TURP  Diagnostic Studies History [name], CMA; 11/26/2015 9:24 AM) Colonoscopy 1-5 years ago  Allergies [name], CMA; 11/26/2015 9:25 AM) Hydrocodone-Acetaminophen *ANALGESICS - OPIOID* Ibuprofen *ANALGESICS -  ANTI-INFLAMMATORY*  Medication History ([name], CMA; 11/26/2015 9:26 AM) Levothyroxine Sodium (112MCG Tablet, Oral) Active. Furosemide (40MG  Tablet, Oral) Active. Spironolactone (50MG  Tablet, Oral) Active. Famotidine (20MG  Tablet, Oral) Active. Sildenafil Citrate (20MG  Tablet, Oral) Active. Citalopram Hydrobromide (20MG  Tablet, Oral) Active. LORazepam (1MG  Tablet, Oral) Active. Omeprazole (40MG  Capsule DR, Oral) Active. Medications Reconciled  Social History [name], CMA; 11/26/2015 9:24 AM) Alcohol use Moderate alcohol use. No caffeine use Tobacco use Current every day smoker.  Family History [name], [place]; 11/26/2015 9:24 AM) Alcohol Abuse Father. Diabetes Mellitus Father. Heart Disease Father.     Review of Systems ([name]; 11/26/2015 9:24 AM) Cardiovascular Present- Leg Cramps. Not Present- Chest Pain, Difficulty Breathing Lying Down, Palpitations, Rapid Heart Rate, Shortness of Breath and Swelling of Extremities. Gastrointestinal Present- Abdominal Pain. Not Present- Bloating, Bloody Stool, Change in Bowel Habits, Chronic diarrhea, Constipation, Difficulty Swallowing, Excessive gas, Gets full quickly at meals, Hemorrhoids, Indigestion, Nausea, Rectal Pain and Vomiting. Musculoskeletal Present- Swelling of Extremities. Not Present- Back Pain, Joint Pain, Joint Stiffness, Muscle Pain and Muscle Weakness. Psychiatric Present- Anxiety. Not Present- Bipolar, Change in Sleep Pattern, Depression, Fearful and Frequent crying. Endocrine Not Present- Cold Intolerance, Excessive Hunger, Hair Changes, Heat Intolerance, Hot flashes and New Diabetes. Hematology Present- Easy Bruising. Not Present- Blood Thinners, Excessive bleeding, Gland problems, HIV and Persistent Infections.  Vitals ([name] CMA; 11/26/2015 9:24 AM) 11/26/2015 9:24 AM Weight: 216 lb Height: 71in Body Surface Area: 2.18 m Body Mass Index: 30.13 kg/m  Temp.:  51F(Temporal)  Pulse: 79 (Regular)  BP: 126/82 (Sitting, Left Arm, Standard)      Physical Exam Justin Hector MD; 11/26/2015 10:01 AM)  General Mental Status-Alert. General Appearance-Not in acute distress, Not Sickly. Orientation-Oriented X3. Hydration-Well hydrated.  Voice-Normal. Note: Calm. Relaxed. In no acute distress. Mild smell of tobacco.  Integumentary Global Assessment Upon inspection and palpation of skin surfaces of the - Axillae: non-tender, no inflammation or ulceration, no drainage. and Distribution of scalp and body hair is normal. General Characteristics Temperature - normal warmth is noted.  Head and Neck Head-normocephalic, atraumatic with no lesions or palpable masses. Face Global Assessment - atraumatic, no absence of expression. Neck Global Assessment - no abnormal movements, no bruit auscultated on the right, no bruit auscultated on the left, no decreased range of motion, non-tender. Trachea-midline. Thyroid Gland Characteristics - non-tender.  Eye Eyeball - Left-Extraocular movements intact, No Nystagmus. Eyeball - Right-Extraocular movements intact, No Nystagmus. Cornea - Left-No Hazy. Cornea - Right-No Hazy. Sclera/Conjunctiva - Left-No scleral icterus, No Discharge. Sclera/Conjunctiva - Right-No scleral icterus, No Discharge. Pupil - Left-Direct reaction to light normal. Pupil - Right-Direct reaction to light normal.  ENMT Ears Pinna - Left - no drainage observed, no generalized tenderness observed. Right - no drainage observed, no generalized tenderness observed. Nose and Sinuses External Inspection of the Nose - no destructive lesion observed. Inspection of the nares - Left - quiet respiration. Right - quiet respiration. Mouth and Throat Lips - Upper Lip - no fissures observed, no pallor noted. Lower Lip - no fissures observed, no pallor noted. Nasopharynx - no discharge present. Oral  Cavity/Oropharynx - Tongue - no dryness observed. Oral Mucosa - no cyanosis observed. Hypopharynx - no evidence of airway distress observed.  Chest and Lung Exam Inspection Movements - Normal and Symmetrical. Accessory muscles - No use of accessory muscles in breathing. Palpation Palpation of the chest reveals - Non-tender. Auscultation Breath sounds - Normal and Clear.  Cardiovascular Auscultation Rhythm - Regular. Murmurs & Other Heart Sounds - Auscultation of the heart reveals - No Murmurs and No Systolic Clicks.  Abdomen Inspection Inspection of the abdomen reveals - No Visible peristalsis and No Abnormal pulsations. Umbilicus - No Bleeding, No Urine drainage. Palpation/Percussion Palpation and Percussion of the abdomen reveal - Soft, Non Tender, No Rebound tenderness, No Rigidity (guarding) and No Cutaneous hyperesthesia. Note: Daily soft and obese. Moderate diastases recti. Supraumbilical hernia within diastases. Reducible.  Male Genitourinary Sexual Maturity Tanner 5 - Adult hair pattern and Adult penile size and shape.  Peripheral Vascular Upper Extremity Inspection - Left - No Cyanotic nailbeds, Not Ischemic. Right - No Cyanotic nailbeds, Not Ischemic.  Neurologic Neurologic evaluation reveals -normal attention span and ability to concentrate, able to name objects and repeat phrases. Appropriate fund of knowledge , normal sensation and normal coordination. Mental Status Affect - not angry, not paranoid. Cranial Nerves-Normal Bilaterally. Gait-Normal.  Neuropsychiatric Mental status exam performed with findings of-able to articulate well with normal speech/language, rate, volume and coherence, thought content normal with ability to perform basic computations and apply abstract reasoning and no evidence of hallucinations, delusions, obsessions or homicidal/suicidal ideation.  Musculoskeletal Global Assessment Spine, Ribs and Pelvis - no instability,  subluxation or laxity. Right Upper Extremity - no instability, subluxation or laxity. Note: Right greater than left lower extremity edema  Lymphatic Head & Neck  General Head & Neck Lymphatics: Bilateral - Description - No Localized lymphadenopathy. Axillary  General Axillary Region: Bilateral - Description - No Localized lymphadenopathy. Femoral & Inguinal  Generalized Femoral & Inguinal Lymphatics: Left - Description - No Localized lymphadenopathy. Right - Description - No Localized lymphadenopathy.    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  Cirrhosis appears to be stable. Tolerated the 2 prior operations and was seen by gastroenterology last fall without any major issues. No change in LFTs. Okay to proceed with surgery from their standpoint  There is concern of a chronic right pelvic fluid collection that seems to be decreasing venous return at giving him chronic right lower extremity swelling. The patient and his sister note he is due for some drainage procedure tomorrow. I do not know that paracentesis or other plan to place a drain to deal with a possible chronic lymph leak/seroma. Once that has resolved and there are no more drains, would like urology to make sure he is okay to proceed with surgery.  Once he quit smoking, plan surgical repair. We do underlay repair with mesh since he has failed 2 primary repairs. I don't get a strong sense that he has significant ascites, nor any infection. I again stressed to him the importance of quitting smoking to minimize recurrence and other complications.  PREOP - [place] - ENCOUNTER FOR PREOPERATIVE EXAMINATION FOR GENERAL SURGICAL PROCEDURE (Z01.818)  Current Plans You are being scheduled for surgery - Our schedulers will call you.  You  should hear from our office's scheduling department within 5 working days about the location, date, and time of surgery. We try to make accommodations for patient's preferences in scheduling surgery, but sometimes the OR schedule or the surgeon's schedule prevents [name] from making those accommodations.  If you have not heard from our office [phone]) in 5 working days, call the office and ask for your surgeon's nurse.  If you have other questions about your diagnosis, plan, or surgery, call the office and ask for your surgeon's nurse.  Written instructions provided The anatomy & physiology of the abdominal wall was discussed. The pathophysiology of hernias was discussed. Natural history risks without surgery including progeressive enlargement, pain, incarceration, & strangulation was discussed. Contributors to complications such as smoking, obesity, diabetes, prior surgery, etc were discussed.  I feel the risks of no intervention will lead to serious problems that outweigh the operative risks; therefore, I recommended surgery to reduce and repair the hernia. I explained laparoscopic techniques with possible need for an open approach. I noted the probable use of mesh to patch and/or buttress the hernia repair  Risks such as bleeding, infection, abscess, need for further treatment, heart attack, death, and other risks were discussed. I noted a good likelihood this will help address the problem. Goals of post-operative recovery were discussed as well. Possibility that this will not correct all symptoms was explained. I stressed the importance of low-impact activity, aggressive pain control, avoiding constipation, & not pushing through pain to minimize risk of post-operative chronic pain or injury. Possibility of reherniation especially with smoking, obesity, diabetes, immunosuppression, and other health conditions was discussed. We will work to minimize complications.  An educational handout further  explaining the pathology & treatment options was given as well. Questions were answered. The patient expresses understanding & wishes to proceed with surgery.  Pt Education - [facility] Hernia Post-Op HCI (Daysha Ashmore): discussed with patient and provided information. Pt Education - [facility] Pain Control (Burl Tauzin) Pt Education - Pamphlet Given - Laparoscopic Hernia Repair: discussed with patient and provided information. TOBACCO ABUSE (Z72.0)  Current Plans Pt Education - [facility] STOP SMOKING! DIASTASIS RECTI (M62.08)  Current Plans Pt Education - [facility] Diastasis Recti: discussed with patient and provided information.  Remo Lipps  C. Johney Maine, M.D., F.A.C.S. Gastrointestinal and Minimally Invasive Surgery [facility] [address], [place], [place] [address] [phone] Main / Paging

## 2015-11-27 ENCOUNTER — Ambulatory Visit (HOSPITAL_COMMUNITY)
Admission: RE | Admit: 2015-11-27 | Discharge: 2015-11-27 | Disposition: A | Discharge status: Home / Self Care | Payer: Medicaid Other | Source: Ambulatory Visit | Attending: Urology | Admitting: Urology

## 2015-11-27 ENCOUNTER — Encounter (HOSPITAL_COMMUNITY): Payer: Self-pay

## 2015-11-27 DIAGNOSIS — I898 Other specified noninfective disorders of lymphatic vessels and lymph nodes: Secondary | ICD-10-CM

## 2015-11-27 LAB — CBC
HCT: 37.5 % — ABNORMAL LOW (ref 39.0–52.0)
HEMOGLOBIN: 12.3 g/dL — AB (ref 13.0–17.0)
MCH: 30 pg (ref 26.0–34.0)
MCHC: 32.8 g/dL (ref 30.0–36.0)
MCV: 91.5 fL (ref 78.0–100.0)
Platelets: 173 10*3/uL (ref 150–400)
RBC: 4.1 MIL/uL — AB (ref 4.22–5.81)
RDW: 15.4 % (ref 11.5–15.5)
WBC: 7.9 10*3/uL (ref 4.0–10.5)

## 2015-11-27 LAB — PROTIME-INR
INR: 0.99
PROTHROMBIN TIME: 13.1 s (ref 11.4–15.2)

## 2015-11-27 LAB — APTT: APTT: 33 s (ref 24–36)

## 2015-11-27 MED ORDER — SODIUM CHLORIDE 0.9 % IV SOLN
INTRAVENOUS | Status: DC
  Administered 2015-11-27: 10:00:00 via INTRAVENOUS

## 2015-11-27 MED ORDER — FENTANYL CITRATE (PF) 100 MCG/2ML IJ SOLN
INTRAMUSCULAR | Status: AC
  Filled 2015-11-27: qty 4

## 2015-11-27 MED ORDER — MIDAZOLAM HCL 2 MG/2ML IJ SOLN
INTRAMUSCULAR | Status: AC
  Filled 2015-11-27: qty 6

## 2015-11-27 MED ORDER — IOPAMIDOL (ISOVUE-300) INJECTION 61%
75.0000 mL | Freq: Once | INTRAVENOUS | Status: AC | PRN
  Administered 2015-11-27: 75 mL via INTRAVENOUS

## 2015-11-27 NOTE — Sedation Documentation (Signed)
Procedure canceled by Dr. Anselm Pancoast. Pt taken to Short Stay for discharge.

## 2015-11-27 NOTE — Progress Notes (Signed)
Patient procedure canceled, no sedation given.  PIV removed, and pt discharged home.

## 2015-11-27 NOTE — Consult Note (Signed)
Chief Complaint: Patient was seen in consultation today for image guided drainage of large right pelvic lymphocele  Referring Physician(s): [place]  Supervising Physician: Markus Daft  Patient Status: Outpatient  History of Present Illness: [name] is a 55 y.o. male with history of tobacco abuse, cirrhosis and prostate cancer ,status post robotic prostatectomy/pelvic lymphadenectomy in April of this year. There was breakdown and dehiscence of the wound post procedure and patient underwent repair. He subsequently developed a wound breakdown with another primary repair and now has recurrent incisional hernia. Since that time he has developed persistent right lower extremity swelling/discomfort. CT of the abdomen and pelvis on 7/1 has revealed an enlarging anterior abdominal wall hernia above the umbilicus, decrease in ascites as well as small fluid collections adjacent to the external iliac arteries bilaterally consistent with postoperative lymphoceles. The lymphocele on the right has increased in size compared to prior exam in April. There is concern that the lymphoceles may be causing some compression upon the external iliac vein leading to the lower extremity edema. He presents today for image guided drainage of the largest right pelvic lymphocele.  Past Medical History:  Diagnosis Date  . Alcoholism ([place])   . Anxiety   . Back pain   . Cirrhosis of liver ([place])   . Depression   . Dysuria   . E. coli UTI (urinary tract infection) 10/28/2015  . ED (erectile dysfunction)   . Elevated PSA   . Hypogonadism, testicular   . Hypothyroidism   . Neck fracture ([place])   . Perioperative dehiscence of abdominal wound with evisceration 08/07/2015  . Pneumonia    hx of pneumonia x 2   . Premature ejaculation   . Prostate CA ([place])   . Seizure ([place])    pt states last seizure was many years ago and stopped dilantin in 2015  . [facility])    s/p left orchiectomy and xrt in 2010  .  Stenosis, cervical spine   . Thyroid disease    hyperthyroidism  . URI 03/01/2010   Qualifier: Diagnosis of  By: Amil Amen MD, Benjamine Mola      Past Surgical History:  Procedure Laterality Date  . APPENDECTOMY    . BACK SURGERY    . DENTAL SURGERY    . LEG SURGERY    . LYMPHADENECTOMY Bilateral 07/30/2015   Procedure: BILATERAL PELVIC LYMPHADENECTOMY;  Surgeon: Raynelle Bring, MD;  Location: [facility];  Service: Urology;  Laterality: Bilateral;  . NECK SURGERY    . ORCHIECTOMY Left 2010  . PROSTATE SURGERY    . ROBOT ASSISTED LAPAROSCOPIC RADICAL PROSTATECTOMY N/A 07/30/2015   Procedure: XI ROBOTIC ASSISTED LAPAROSCOPIC RADICAL PROSTATECTOMY LEVEL 2;  Surgeon: Raynelle Bring, MD;  Location: [facility];  Service: Urology;  Laterality: N/A;  . WOUND EXPLORATION N/A 08/07/2015   Procedure: EXPLORATORY LAPAROTOMY WITH WOUND CLOSURE;  Surgeon: Raynelle Bring, MD;  Location: [facility];  Service: Urology;  Laterality: N/A;    Allergies: Hydrocodone and Motrin [ibuprofen]  Medications: Prior to Admission medications   Medication Sig Start Date End Date Taking? Authorizing Provider  amoxicillin-clavulanate (AUGMENTIN) 875-125 MG tablet Take 1 tablet by mouth 2 (two) times daily. 10/29/15  Yes Jani Gravel, MD  furosemide (LASIX) 40 MG tablet Take 40 mg by mouth daily.   Yes Historical Provider, MD  levothyroxine (SYNTHROID, LEVOTHROID) 112 MCG tablet Take 112 mcg by mouth every morning.   Yes Historical Provider, MD  nicotine (NICODERM CQ - DOSED IN MG/24 HOURS) 21 mg/24hr patch  Place 1 patch (21 mg total) onto the skin daily. 10/29/15  Yes Jani Gravel, MD  potassium chloride (K-DUR) 10 MEQ tablet Take 10 mEq by mouth daily.   Yes Historical Provider, MD  rifaximin (XIFAXAN) 550 MG TABS tablet Take 550 mg by mouth 2 (two) times daily.   Yes Historical Provider, MD  spironolactone (ALDACTONE) 50 MG tablet Take 50 mg by mouth 2 (two) times daily.   Yes Historical Provider, MD  famotidine (PEPCID) 20 MG tablet Take 1  tablet (20 mg total) by mouth 2 (two) times daily. 10/29/15   Jani Gravel, MD  ondansetron (ZOFRAN) 4 MG tablet Take 1 tablet (4 mg total) by mouth every 8 (eight) hours as needed for nausea or vomiting. 10/29/15   Jani Gravel, MD     Family History  Problem Relation Age of Onset  . Cancer Father     testicular (per [facility] notes)  . Cancer Paternal Uncle     leukemia    Social History   Social History  . Marital status: Single    Spouse name: N/A  . Number of children: N/A  . Years of education: N/A   Social History Main Topics  . Smoking status: Current Every Day Smoker    Packs/day: 1.50    Years: 36.00    Types: Cigarettes  . Smokeless tobacco: Never Used  . Alcohol use Yes     Comment: quit 2012   . Drug use: No  . Sexual activity: Yes   Other Topics Concern  . None   Social History Narrative  . None      Review of Systems see above; currently denies fever, headache, chest pain, dyspnea, cough, abdominal/back pain, nausea, vomiting or abnormal bleeding.  Vital Signs: BP 129/73 (BP Location: Right Arm)   Pulse 71   Temp 98.7 F (37.1 C) (Oral)   Resp 18   Wt 215 lb (97.5 kg)   SpO2 99%   BMI 29.99 kg/m   Physical Exam patient awake, alert. Chest with distant breath sounds bilaterally. Heart with regular rate and rhythm. Abdomen obese, slightly distended, supra umbilical hernia within diastasis recti, NT; slight right greater than left lower extremity edema  Mallampati Score:     Imaging: No results found.  Labs:  CBC:  Recent Labs  10/27/15 1250 10/27/15 1303 10/28/15 0810 10/29/15 0421 11/27/15 0918  WBC 8.8  --  6.4 5.6 7.9  HGB 10.8* 11.9* 9.7* 9.8* 12.3*  HCT 32.2* 35.0* 28.5* 29.6* 37.5*  PLT 239  --  201 191 173    COAGS:  Recent Labs  07/30/15 0945 08/09/15 0037 10/29/15 0421  INR 1.04 0.95 1.31    BMP:  Recent Labs  08/09/15 0037 10/27/15 1250 10/27/15 1303 10/28/15 0810 10/29/15 0421  NA 133* 130* 132* 132* 136    K 4.5 3.5 3.5 3.4* 3.5  CL 100* 98* 96* 100* 103  CO2 26 24  --  24 27  GLUCOSE 108* 107* 106* 173* 122*  BUN 15 6 4* 9 11  CALCIUM 8.9 8.8*  --  8.1* 8.4*  CREATININE 1.20 0.75 0.80 1.11 1.05  GFRNONAA >60 >60  --  >60 >60  GFRAA >60 >60  --  >60 >60    LIVER FUNCTION TESTS:  Recent Labs  08/09/15 0037 10/27/15 1250 10/29/15 0421  BILITOT 0.7 0.9 0.7  AST 26 23 60*  ALT 9* 19 38  ALKPHOS 65 152* 226*  PROT 7.4 7.7 6.8  ALBUMIN 3.5 3.6  2.8*    TUMOR MARKERS: No results for input(s): AFPTM, CEA, CA199, CHROMGRNA in the last 8760 hours.  Assessment and Plan: 55 y.o. male with history of tobacco abuse, cirrhosis and prostate cancer ,status post robotic prostatectomy/pelvic lymphadenectomy in April of this year. There was breakdown and dehiscence of the wound post procedure and patient underwent repair. He subsequently developed a wound breakdown with another primary repair and now has recurrent incisional hernia. Since that time he has developed persistent right lower extremity swelling/discomfort. CT of the abdomen and pelvis on 7/1 has revealed an enlarging anterior abdominal wall hernia above the umbilicus, decrease in ascites as well as small fluid collections adjacent to the external iliac arteries bilaterally consistent with postoperative lymphoceles. The lymphocele on the right has increased in size compared to prior exam in April. There is concern that the lymphoceles may be causing some compression upon the external iliac vein leading to the lower extremity edema. He presents today for image guided drainage of the largest right pelvic lymphocele. Details/risks of procedure, including but not limited to, internal bleeding, infection, injury to adjacent structures, discussed with patient and family with their understanding and consent.     Thank you for this interesting consult.  I greatly enjoyed meeting SOSA PACIFICO and look forward to participating in their care.  A  copy of this report was sent to the requesting provider on this date.  Electronically Signed: D. Rowe Robert 11/27/2015, 9:35 AM   I spent a total of 25 minutes    in face to face in clinical consultation, greater than 50% of which was counseling/coordinating care for image guided drainage of right pelvic lymphocele

## 2015-11-27 NOTE — Discharge Instructions (Signed)
Percutaneous Drain An abscess is a collection of infected fluid inside the body. Your health care provider may decide to remove or drain the  fluid from the area by placing a thin needle into the abscess. Usually, a small tube is left in place to drain the fluid. The fluid may take a few days to drain. LET [facility] CARE PROVIDER KNOW ABOUT:  Any allergies you have.  All medicines you are taking, including vitamins, herbs, eye drops, creams, and over-the-counter medicines. This includes steroid medicines by mouth or cream.  Previous problems you or members of your family have had with the use of anesthetics.  Any blood disorders you have.  Previous surgeries you have had.  Possibility of pregnancy, if this applies.  Medical conditions you have.  Any history of smoking. RISKS AND COMPLICATIONS Generally, this is a safe procedure. However, problems can occur and include:   Infection.  Allergic reaction to materials used (such as contrast dye).  Damage to a nearby organ or tissue.  Bleeding.  Blockage of a tube placed to drain the abscess, requiring placement of a new drainage tube.  A need to repeat the procedure.  Failure of the procedure to adequately drain the abscess, requiring an open surgical procedure to do so. BEFORE THE PROCEDURE   Ask your health care provider about:  Changing or stopping your regular medicines. This is especially important if you are taking diabetes medicines or blood thinners.  Taking medicines such as aspirin and ibuprofen. These medicines can thin your blood. Do not take these medicines before your procedure if your health care provider asks you not to.  Your health care provider may do some blood or urine tests. These will help your health care provider learn how well your kidneys and liver are working and how well your blood clots.  Do not eat or drink anything after midnight on the night before the procedure or as directed by your health  care provider.  Make arrangements for someone to drive you home after the procedure.  PROCEDURE   An IV tube will be placed in your arm. Medicine will be able to flow directly into your body through this tube.  You will lie on an X-ray table.  Your heart rate, blood pressure, and breathing will be monitored.   Your oxygen level will also be watched during the procedure. Supplemental oxygen may be given if necessary.  The skin around the area where the drainage tube (catheter) will be placed will be cleaned and numbed.  A small cut (incision) will then be made to insert the drainage tube. The drainage tube will be inserted using X-ray or CT scan to help direct where it should be placed.  The drainage tube will be guided into the abscess to drain the infected fluid.  The drainage tube may stay in place and be connected to a bag outside your body. It will stay until the fluid has stopped draining and the infection is gone. AFTER THE PROCEDURE  You will be taken to a recovery area where you will stay until the medicines have worn off.  You will stay in bed for several hours.  Your progress will be monitored.   Your blood pressure and pulse will be checked often.   The area of the incision will be checked often.  You may have some pain or feel sick. Tell your health care provider.  As you begin to feel better, you may be given ice, fluids, and food.  When you can walk, drink, eat, and use the bathroom, you may be able to go home.   This information is not intended to replace advice given to you by your health care provider. Make sure you discuss any questions you have with your health care provider.   Document Released: 08/29/2013 Document Reviewed: 08/29/2013 Elsevier Interactive Patient Education 2016 [employer] Percutaneous Drain, Care After Refer to this sheet in the next few weeks. These instructions provide you with information on caring for yourself after  your procedure. Your health care provider may also give you more specific instructions. Your treatment has been planned according to current medical practices, but problems sometimes occur. Call your health care provider if you have any problems or questions after your procedure. WHAT TO EXPECT AFTER THE PROCEDURE After your procedure, it is typical to have the following:   A small amount of discomfort in the area where the drainage tube was placed.  A small amount of bruising around the area where the drainage tube was placed.  Sleepiness and fatigue for the rest of the day from the medicines used. HOME CARE INSTRUCTIONS  Rest at home for 1-2 days following your procedure or as directed by your health care provider.  If you go home right after the procedure, plan to have someone with you for 24 hours.  Do not take a bathor shower for 24 hours after your procedure.  Take medicines only as directed by your health care provider. Ask your health care provider when you can resume taking any normal medicines.  Change bandages (dressings) as directed.   You may be told to record the amount of drainage from the bag every time you empty it. Follow your health care provider's directions for emptying the bag. Write down the amount of drainage, the date, and the time you emptied it.  Call your health care provider when the drain is putting out less than 10 mL of drainage per day for 2-3 days in a row or as directed by your health care provider.  Follow your health care provider's instructions for cleaning the drainage tube. You may need to clean the tube every day so that it does not clog. SEEK MEDICAL CARE IF:  You have increased bleeding (more than a small spot) from the site where the drainage tube was placed.  You have redness, swelling, or increasing pain around the site where the drainage tube was placed.  You notice a discharge or bad smell coming from the site where the drainage tube  was placed.  You have a fever or chills.  You have pain that is not helped by medicine.  SEEK IMMEDIATE MEDICAL CARE IF:  There is leakage around the drainage tube.  The drainage tube pulls out.  You suddenly stop having drainage from the tube.  You suddenly have blood in the drainage fluid.  You become dizzy or faint.  You develop a rash.   You have nausea or vomiting.  You have difficulty breathing, feel short of breath, or feel faint.   You develop chest pain.  You have problems with your speech or vision.  You have trouble balancing or moving your arms or legs.   This information is not intended to replace advice given to you by your health care provider. Make sure you discuss any questions you have with your health care provider.   Document Released: 08/29/2013 Document Revised: 01/31/2014 Document Reviewed: 08/29/2013 Elsevier Interactive Patient Education 2016 [place]. Moderate Conscious Sedation,  Adult Sedation is the use of medicines to promote relaxation and relieve discomfort and anxiety. Moderate conscious sedation is a type of sedation. Under moderate conscious sedation you are less alert than normal but are still able to respond to instructions or stimulation. Moderate conscious sedation is used during short medical and dental procedures. It is milder than deep sedation or general anesthesia and allows you to return to your regular activities sooner. LET [facility] CARE PROVIDER KNOW ABOUT:   Any allergies you have.  All medicines you are taking, including vitamins, herbs, eye drops, creams, and over-the-counter medicines.  Use of steroids (by mouth or creams).  Previous problems you or members of your family have had with the use of anesthetics.  Any blood disorders you have.  Previous surgeries you have had.  Medical conditions you have.  Possibility of pregnancy, if this applies.  Use of cigarettes, alcohol, or illegal drugs. RISKS AND  COMPLICATIONS Generally, this is a safe procedure. However, as with any procedure, problems can occur. Possible problems include:  Oversedation.  Trouble breathing on your own. You may need to have a breathing tube until you are awake and breathing on your own.  Allergic reaction to any of the medicines used for the procedure. BEFORE THE PROCEDURE  You may have blood tests done. These tests can help show how well your kidneys and liver are working. They can also show how well your blood clots.  A physical exam will be done.  Only take medicines as directed by your health care provider. You may need to stop taking medicines (such as blood thinners, aspirin, or nonsteroidal anti-inflammatory drugs) before the procedure.   Do not eat or drink at least 6 hours before the procedure or as directed by your health care provider.  Arrange for a responsible adult, family member, or friend to take you home after the procedure. He or she should stay with you for at least 24 hours after the procedure, until the medicine has worn off. PROCEDURE   An intravenous (IV) catheter will be inserted into one of your veins. Medicine will be able to flow directly into your body through this catheter. You may be given medicine through this tube to help prevent pain and help you relax.  The medical or dental procedure will be done. AFTER THE PROCEDURE  You will stay in a recovery area until the medicine has worn off. Your blood pressure and pulse will be checked.   Depending on the procedure you had, you may be allowed to go home when you can tolerate liquids and your pain is under control.   This information is not intended to replace advice given to you by your health care provider. Make sure you discuss any questions you have with your health care provider.   Document Released: 01/07/2001 Document Revised: 05/05/2014 Document Reviewed: 12/20/2012 Elsevier Interactive Patient Education [employer].

## 2015-11-28 ENCOUNTER — Other Ambulatory Visit (HOSPITAL_COMMUNITY): Payer: Self-pay | Admitting: Urology

## 2015-11-28 DIAGNOSIS — I898 Other specified noninfective disorders of lymphatic vessels and lymph nodes: Secondary | ICD-10-CM

## 2015-11-29 ENCOUNTER — Other Ambulatory Visit (HOSPITAL_COMMUNITY): Payer: Self-pay | Admitting: Urology

## 2015-11-29 DIAGNOSIS — C61 Malignant neoplasm of prostate: Secondary | ICD-10-CM

## 2015-12-05 ENCOUNTER — Encounter: Payer: Self-pay | Admitting: Medical Oncology

## 2015-12-05 NOTE — Progress Notes (Signed)
I called pt to introduce myself as the Prostate Nurse Navigator and the Coordinator of the Prostate [place].  1. I confirmed with the patient he is aware of his referral to the clinic 12/18/15 arriving at 12:15pm. I asked him to have lunch before his arrival due to the length of the clinic.  2. I discussed the format of the clinic and the physicians he will be seeing that day.  3. I discussed where the clinic is located and how to contact me.  4. I confirmed his address and informed him I would be mailing a packet of information and forms to be completed. I asked him to bring them with him the day of his appointment.   He voiced understanding of the above. I asked him to call me if he has any questions or concerns regarding his appointments or the forms he needs to complete.  He states that he is having a PSA drawn next week and he is to pick up a "kit". He is scheduled for a CT next week and he will need to pick up his contrast. I informed him to go to radiology to pick it up. He voiced understanding.

## 2015-12-11 ENCOUNTER — Ambulatory Visit (HOSPITAL_COMMUNITY): Payer: Medicaid Other

## 2015-12-14 ENCOUNTER — Encounter: Payer: Self-pay | Admitting: Radiation Oncology

## 2015-12-14 NOTE — Progress Notes (Signed)
GU Location of Tumor / Histology: prostatic adenocarcinoma  If Prostate Cancer, Gleason Score is (4 + 3) and PSA is (11.2) pre treatment     Past/Anticipated interventions by urology, if any: radical prostatectomy complicated by wound dehiscence and lymphocele  Past/Anticipated interventions by medical oncology, if any: no  Weight changes, if any: no  Bowel/Bladder complaints, if any: nocturia, ED, has regained full continence following prostatectomy   Nausea/Vomiting, if any: no  Pain issues, if any:  no  SAFETY ISSUES:  Prior radiation? Yes for testicular cancer  Pacemaker/ICD? no  Possible current pregnancy? no  Is the patient on methotrexate? no  Current Complaints / other details:  55 year old male. Single. NKDA.

## 2015-12-17 ENCOUNTER — Telehealth: Payer: Self-pay | Admitting: Medical Oncology

## 2015-12-17 NOTE — Progress Notes (Signed)
Oncology Nurse Navigator Documentation  Oncology Nurse Navigator Flowsheets 12/05/2015 12/17/2015  Navigator Location [facility] -  Navigator Encounter Type Introductory phone call Telephone  Telephone - Outgoing Call;Appt Confirmation/Clarification- Pt confirmed appointment. I reminded him to bring his completed medical forms and to have lunch before his arrival.  Abnormal Finding Date 10/28/2015 -  Confirmed Diagnosis Date 05/16/2015 -  Surgery Date 07/30/2015 -  Treatment Phase Pre-Tx/Tx Discussion -  Barriers/Navigation Needs Education No barriers at this time  Education Understanding Cancer/ Treatment Options -  Interventions Education Method -  Education Method Teach-back;Verbal -  Support Groups/Services Friends and Family -  Acuity Level 2 -  Acuity Level 2 Initial guidance, education and coordination as needed;Educational needs -  Time Spent with Patient 15 15

## 2015-12-18 ENCOUNTER — Ambulatory Visit (HOSPITAL_BASED_OUTPATIENT_CLINIC_OR_DEPARTMENT_OTHER): Payer: Medicaid Other | Admitting: Oncology

## 2015-12-18 ENCOUNTER — Ambulatory Visit
Admission: RE | Admit: 2015-12-18 | Discharge: 2015-12-18 | Disposition: A | Discharge status: Home / Self Care | Payer: Medicaid Other | Source: Ambulatory Visit | Attending: Radiation Oncology | Admitting: Radiation Oncology

## 2015-12-18 ENCOUNTER — Encounter: Payer: Self-pay | Admitting: Radiation Oncology

## 2015-12-18 ENCOUNTER — Encounter: Payer: Self-pay | Admitting: General Practice

## 2015-12-18 ENCOUNTER — Encounter: Payer: Self-pay | Admitting: Medical Oncology

## 2015-12-18 VITALS — BP 138/70 | HR 61 | Temp 98.0°F | Resp 18 | Ht 70.0 in | Wt 222.8 lb

## 2015-12-18 DIAGNOSIS — F329 Major depressive disorder, single episode, unspecified: Secondary | ICD-10-CM | POA: Diagnosis not present

## 2015-12-18 DIAGNOSIS — C61 Malignant neoplasm of prostate: Secondary | ICD-10-CM | POA: Insufficient documentation

## 2015-12-18 DIAGNOSIS — I898 Other specified noninfective disorders of lymphatic vessels and lymph nodes: Secondary | ICD-10-CM | POA: Insufficient documentation

## 2015-12-18 DIAGNOSIS — N529 Male erectile dysfunction, unspecified: Secondary | ICD-10-CM | POA: Insufficient documentation

## 2015-12-18 DIAGNOSIS — M549 Dorsalgia, unspecified: Secondary | ICD-10-CM | POA: Diagnosis not present

## 2015-12-18 DIAGNOSIS — K7460 Unspecified cirrhosis of liver: Secondary | ICD-10-CM

## 2015-12-18 DIAGNOSIS — E0590 Thyrotoxicosis, unspecified without thyrotoxic crisis or storm: Secondary | ICD-10-CM | POA: Diagnosis not present

## 2015-12-18 DIAGNOSIS — F419 Anxiety disorder, unspecified: Secondary | ICD-10-CM | POA: Insufficient documentation

## 2015-12-18 DIAGNOSIS — E039 Hypothyroidism, unspecified: Secondary | ICD-10-CM | POA: Diagnosis not present

## 2015-12-18 DIAGNOSIS — F1020 Alcohol dependence, uncomplicated: Secondary | ICD-10-CM | POA: Diagnosis not present

## 2015-12-18 DIAGNOSIS — R609 Edema, unspecified: Secondary | ICD-10-CM | POA: Diagnosis not present

## 2015-12-18 HISTORY — DX: Other specified noninfective disorders of lymphatic vessels and lymph nodes: I89.8

## 2015-12-18 HISTORY — DX: Personal history of irradiation: Z92.3

## 2015-12-18 NOTE — Addendum Note (Signed)
Encounter addended by: Raynelle Bring, MD on: 12/18/2015  3:15 PM<BR>    Actions taken: Sign clinical note

## 2015-12-18 NOTE — Consult Note (Signed)
[facility]     12/18/2015   --------------------------------------------------------------------------------   [name]  MRN: [id number]  PRIMARY CARE:  [facility]  DOB: 07/01/1960, 55 year old Male  REFERRING:    SSN: [id number]  PROVIDER:  Raynelle Bring, M.D.    LOCATION:  [facility] - [phone]   --------------------------------------------------------------------------------   CC/HPI: CC: Prostate Cancer   Location of consult: [facility]   [name] is a 55 year old gentleman who was noted to have a PSA of 11.2 prompting a TRUS biopsy on 05/16/15 demonstrating 7 out of 12 biopsy cores positive for Gleason 4+5=9 adenocarcinoma of the prostate. He elected primary surgical therapy and underwent a a UNS RAL radical prostatectomy and BPLND on 07/30/15 for high risk prostate cancer. His postoperative course was complicated by wound dehiscence (which was repaired) followed by development of an incisional hernia. He also developed right lower extremity edema in early July. He was admitted to the hospital and was evaluated for a possible DVT which was negative. It was felt that his edema was likely due to a compressive pelvic lymphocele. He had his PSA checked while hospitalized and it was found to have 0.13. He was scheduled for percutaneous drainage but upon presenting to IR, it was noted that his edema had resolved spontaneously without intervention and his procedure was cancelled. His PSA was rechecked on 12/06/15 and remained elevated at 0.12.   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   His medical history is also significant for a history of alcohol induced liver failure. He has been sober for 4 years. He is followed by Dr. Clarene Essex. He has undergone a prior appendectomy.    Since his last visit, he has suffered an severe laceration of his left lower extremity requiring stitches. Fortunately, he did not suffer any severe neurologic or vascular impairment. He continues to await a surgery date for repair of his incisional hernia by Dr. Johney Maine.     ALLERGIES: No Allergies    MEDICATIONS: Sildenafil 20 mg tablet 1-5 tablet PO PRN as directed  Citalopram Hydrobromide 40 MG Oral Tablet Oral  Furosemide 40 MG Oral Tablet Oral  Levothyroxine Sodium 112 MCG Oral Tablet Oral  LORazepam TABS Oral  Potassium Chloride Crys ER 10 MEQ Oral Tablet Extended Release Oral  Spironolactone 50 MG Oral Tablet Oral     GU PSH: Simple Scrotal Orchiectomy - 2010 Suspend Testis - 2010      PSH Notes: Secondary Closure Of Dehiscence, Neck Surgery, Back Surgery, Arthrodesis Cervical, Leg Repair, Dental Surgery, Appendectomy, Surgery Testis Fixation Of Contralateral Testis Right, Orchiectomy Left   NON-GU PSH: Appendectomy - 2010 Late Closure Wound - 08/15/2015    GU PMH: Prostate Cancer, Prostate cancer - 08/07/2015 ED, arterial insufficiency, Erectile dysfunction due to arterial insufficiency - 08/03/2015 Malig Neo Descend Left Testis, Seminoma of descended left testis - 07/18/2015 Testicular hypofunction, Hypogonadism, testicular - 07/18/2015 Personal history of malignant neoplasm of testis, History of malignant neoplasm of testis - 06/29/2015      PMH Notes:   1) Prostate cancer: He is s/p a UNS RAL radical prostatectomy and BPLND on 07/30/15. His postoperative  course was complicated by wound dehiscence (which was repaired) followed by development of an incisional hernia. He also developed right lower extremity edema likely due to a compressive pelvic lymphocele  which resolved spontaneously without intervention.   Diagnosis: pT3a N0 Mx, Gleason 4+3=7 (tertiary pattern 5) adenocarcinoma with a focal positive margin  Pretreatment PSA: 11.2  Pretreatment SHIM: 20 (He has a history of erectile dysfunction, premature ejaculation, and testosterone deficiency (now off therapy)).   2) History of prostatitis: He did have a history of recurrent bacterial prostatitis prior to his prostatectomy.   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   ** His medical history is also significant for a history of alcohol induced liver failure. He has been sober for 4 years. He is followed by Dr. Clarene Essex.    NON-GU PMH: Incisional hernia without obstruction or gangrene, Ventral incisional hernia without obstruction or gangrene - 09/27/2015 Disruption of external operation (surgical) wound, not elsewhere classified, initial encounter, Postoperative wound dehiscence - 08/07/2015 Fracture of neck, unspecified, initial encounter, Neck fracture - 03/20/2015 Anxiety disorder, unspecified, Anxiety - 2014 Personal history of other mental and behavioral disorders, History of depression - 2014 Spinal stenosis, cervical region, Cervical Spine Stenosis - 2014 Unspecified cirrhosis of liver, Cirrhosis - 2014 Alcohol abuse, uncomplicated Thyrotoxicosis, unspecified without thyrotoxic crisis or storm    FAMILY HISTORY: Alcoholism - Father Blood In Urine - Sister Death In The Family Father - Father, Runs In Family Diabetes - Father Heart Disease - Father testicular cancer - Father   SOCIAL HISTORY: Marital Status: Single     Notes: Current every day smoker, Marital History - Single, Occupation:, Alcohol Use, Tobacco Use   REVIEW OF SYSTEMS:    GU Review Male:   Patient denies frequent urination, hard to postpone urination, burning/ pain with urination, get up at night to urinate, leakage of  urine, stream starts and stops, trouble starting your streams, and have to strain to urinate .  Gastrointestinal (Upper):   Patient denies nausea and vomiting.  Gastrointestinal (Lower):   Patient denies diarrhea and constipation.  Constitutional:   Patient denies fever, night sweats, weight loss, and fatigue.  Skin:   Patient denies skin rash/ lesion and itching.  Eyes:   Patient denies blurred vision and double vision.  Ears/ Nose/ Throat:   Patient denies sore throat and sinus problems.  Hematologic/Lymphatic:   Patient denies swollen glands and easy bruising.  Cardiovascular:   Patient denies leg swelling and chest pains.  Respiratory:   Patient denies cough and shortness of breath.  Endocrine:   Patient denies excessive thirst.  Musculoskeletal:   Patient denies back pain and joint pain.  Neurological:   Patient denies headaches and dizziness.  Psychologic:   Patient denies depression and anxiety.   VITAL SIGNS: None   MULTI-SYSTEM PHYSICAL EXAMINATION:    Constitutional: Well-nourished. No physical deformities. Normally developed. Good grooming.  Musculoskeletal: Normal gait and station of head and neck. He has a large 10 cm laceration behind his left knee which has been stiched and repaired. There is expected erythema. He is on antibiotics.     PAST DATA REVIEWED:  Source Of History:  Patient  Lab Test Review:   PSA  Records Review:   Pathology Reports  X-Ray Review: C.T. Abdomen/Pelvis: Reviewed Films.     12/06/15 04/12/15 03/21/15 02/07/14  PSA  Total PSA 0.12  11.20  9.63  3.66  Free PSA  0.48     % Free PSA  4       11/26/10 01/07/10 06/22/09  Hormones  Testosterone, Total 654.13  368.0  362.0     PROCEDURES: None   ASSESSMENT:      ICD-10 Details  1 GU:   Prostate Cancer - C61   2   ED, arterial insufficiency - N52.01    PLAN:           Document Letter(s):  Created for Patient: Clinical Summary         Notes:   1. Prostate cancer: We reviewed his  recent PSA which is stable. He continues to have excellent continence. I have strongly recommended proceeding with radiation therapy. Dr. Tammi Klippel confirmed that he can receive radiation to the pelvis based on his treatment course for his seminoma in the past. He would like to get over his hernia repair first and this is probably fine considering the stability of his PSA. I will arrange f/u with me in about 6 months to recheck his PSA after completion of radiation.   2. Erectile dysfunction: We reviewed options. He is having a decent but not ideal response to sildenafil. We reviewed other options including VED and ICI. He has previously tried ICI with success but found it too expensive. Currently, he wishes to continue current treatment. I am optimistic that he will see improvement over time although he understands the negative impact that radiation could have on his recovery.    cc: Dr. Tyler Pita  Dr. Zola Button  Dr. Neysa Bonito     E & M CODE: I spent at least 25 minutes face to face with the patient, more than 50% of that time was spent on counseling and/or coordinating care.

## 2015-12-18 NOTE — Progress Notes (Signed)
                               Care Plan Summary  Name: [name] DOB: Dec 31, 1960   Your Medical Team:   Urologist -  Dr. Raynelle Bring, [name]  Radiation Oncologist - Dr. Tyler Pita, [facility]   Medical Oncologist - Dr. Zola Button, [place]  Recommendations: 1) Hernia repair- Dr. Johney Cook  2) Androgen Deprivation 3) Radiation treatments  * These recommendations are based on information available as of today's consult.      Recommendations may change depending on the results of further tests or exams.  Next Steps: 1) Justin Rue, RN-navigator will contact Dr. Clyda Cook office for appointment to repair hernia 2) Dr. Johny Cook office will set up CT simulation-  Approximally 2 weeks post surgery 3) Dr. Johny Cook office will schedule radiation treatments  When appointments need to be scheduled, you will be contacted by [name] and/or [name].  Questions?  Please do not hesitate to call Justin Rue, RN, BSN, OCN at [phone] any questions or concerns.  Shirlean Mylar is your Oncology Nurse Navigator and is available to assist you while you're receiving your medical care at [facility].

## 2015-12-18 NOTE — Progress Notes (Signed)
Radiation Oncology         [phone] ________________________________ Follow Up Consultation, Multidisciplinary Prostate Clinic  Name: [name] MRN: [id number]  Date: 12/18/2015  DOB: 31-May-1960  CC:No primary care provider on file.  Justin Clines, MD   REFERRING PHYSICIAN: [name], MD  DIAGNOSIS: 55 y.o. 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 Prostate cancer ([place]) [facility] referral to Social Work  2. Malignant neoplasm of prostate s/p robotic prostatectomy 07/30/2015 Cecil-Bishop is a 55 y.o. gentleman who has been followed by [name] for many years. In 2010 the patient was taken urgently to the operating room for was felt to initially be a testicular torsion however, the patient was diagnosed with a left stage IIB seminoma. Due to the emergent nature of his operation, this is performed with a trans-scrotal approach. Staging workup demonstrated two enlarged periaortic lymph nodes measuring 2.2 and 1.6 cm. He was referred to [name] for radiotherapy. He was treated between 12/06/08 and 01/03/2009 with 25.5 Gy in 15 fractions to the left hemipelvis and periaortic lymph nodes with a periaortic boost of 35.5 Gy over 20 fractions. The patient has been followed on an annual basis by urology since his diagnosis and has been without evidence of disease. His PSA has been followed as well, and in October 2015 was 3.66, in November 2016, it was 9.63. This was rechecked in December 2016, after a course of antibiotic therapy, and was persistently elevated at 11.2.   On 05/16/2015, the patient underwent biopsy revealing adenocarcinoma in 7/12 biopsy sites. Gleason score was 4+5 in 2 of the sites. His prostate measured 14.9 mL, and did not have any palpable nodules.   He met with [name] on 05/28/15 to discuss the options for  radiotherapy. He decided on radical robotic prostatectomy which was performed on 07/30/15. This revealed prostatic adenocarcinoma (pT3a, pN0) Gleason's score 4+3=7 with with focal left extracapsular extension, extensive right extracapsular extension, and focal right apex margin involvement. 11 left pelvic lymph nodes and 4 right pelvic lymph nodes were benign.  Due to the extracapsular extension, margin involvement, and the patient's detectable PSA value, [name] recommended adjuvant radiation therapy. The patient presents today in multidisciplinary clinic to further discuss his options of treatment.  PREVIOUS RADIATION THERAPY: Yes, to treat Stage IIb seminoma in 2010, 25.5 Gy with boost to 45.5 Gy ([name]). Transscrotal orchectomy also performed.              PAST MEDICAL HISTORY:  Past Medical History:  Diagnosis Date  . Alcoholism ([place])   . Anxiety   . Back pain   . Cancer ([place])    testicular  . Cirrhosis of liver ([place])   . Depression   . Dysuria   . E. coli UTI (urinary tract infection) 10/28/2015  . ED (erectile dysfunction)   . Elevated PSA   . History of radiation therapy   . Hypogonadism, testicular   . Hypothyroidism   . Lymphocele   . Neck fracture ([place])   . Perioperative dehiscence of abdominal wound with evisceration 08/07/2015  . Pneumonia    hx of pneumonia x 2   . Premature ejaculation   . Prostate CA ([place])   . Seizure ([place])    pt states last seizure was many years ago and stopped dilantin in 2015  .  Seminoma [facility])    s/p left orchiectomy and xrt in 2010  . Stenosis, cervical spine   . Thyroid disease    hyperthyroidism  . URI 03/01/2010   Qualifier: Diagnosis of  By: [name] SURGICAL HISTORY: Past Surgical History:  Procedure Laterality Date  . APPENDECTOMY    . BACK SURGERY    . DENTAL SURGERY    . LEG SURGERY    . LYMPHADENECTOMY Bilateral 07/30/2015   Procedure: BILATERAL PELVIC LYMPHADENECTOMY;  Surgeon: Justin Bring, MD;  Location: [facility];  Service: Urology;  Laterality: Bilateral;  . NECK SURGERY    . ORCHIECTOMY Left 2010  . PROSTATE SURGERY    . ROBOT ASSISTED LAPAROSCOPIC RADICAL PROSTATECTOMY N/A 07/30/2015   Procedure: XI ROBOTIC ASSISTED LAPAROSCOPIC RADICAL PROSTATECTOMY LEVEL 2;  Surgeon: Justin Bring, MD;  Location: [facility];  Service: Urology;  Laterality: N/A;  . WOUND EXPLORATION N/A 08/07/2015   Procedure: EXPLORATORY LAPAROTOMY WITH WOUND CLOSURE;  Surgeon: Justin Bring, MD;  Location: [facility];  Service: Urology;  Laterality: N/A;    FAMILY HISTORY:  Family History  Problem Relation Age of Onset  . Cancer Father     testicular (per [facility])  . Cancer Paternal Uncle     leukemia    SOCIAL HISTORY:  reports that he has been smoking Cigarettes.  He has a 54.00 pack-year smoking history. He has never used smokeless tobacco. He reports that he drinks alcohol. He reports that he does not use drugs. He is on disability due to his cervical spine abnormalities, and lives with his girlfriend and [name]. His sister accompanies him for today's visit, and she has a healthcare power of attorney.  ALLERGIES: Hydrocodone and Motrin [ibuprofen]  MEDICATIONS:  Current Outpatient Prescriptions  Medication Sig Dispense Refill  . cephALEXin (KEFLEX) 500 MG capsule Take 500 mg by mouth daily.    . furosemide (LASIX) 40 MG tablet Take 40 mg by mouth daily.    Marland Kitchen levothyroxine (SYNTHROID, LEVOTHROID) 112 MCG tablet Take 112 mcg by mouth every morning.    . potassium chloride (K-DUR) 10 MEQ tablet Take 10 mEq by mouth daily.    . rifaximin (XIFAXAN) 550 MG TABS tablet Take 550 mg by mouth 2 (two) times daily.    Marland Kitchen amoxicillin-clavulanate (AUGMENTIN) 875-125 MG tablet Take 1 tablet by mouth 2 (two) times daily. (Patient not taking: Reported on 12/18/2015) 20 tablet 0  . famotidine (PEPCID) 20 MG tablet Take 1 tablet (20 mg total) by mouth 2 (two) times daily. (Patient not taking: Reported  on 12/18/2015) 60 tablet 0  . lactulose (CHRONULAC) 10 GM/15ML solution 30 mLs as needed. Take three times daily.    Marland Kitchen LORazepam (ATIVAN) 1 MG tablet Take by mouth.    . nicotine (NICODERM CQ - DOSED IN MG/24 HOURS) 21 mg/24hr patch Place 1 patch (21 mg total) onto the skin daily. (Patient not taking: Reported on 12/18/2015) 28 patch 0  . ondansetron (ZOFRAN) 4 MG tablet Take 1 tablet (4 mg total) by mouth every 8 (eight) hours as needed for nausea or vomiting. (Patient not taking: Reported on 12/18/2015) 20 tablet 0  . spironolactone (ALDACTONE) 50 MG tablet Take 50 mg by mouth 2 (two) times daily.     No current facility-administered medications for this encounter.     REVIEW OF SYSTEMS:  On review of systems, the patient reports that he is doing well overall. He denies any chest pain, shortness of  breath, cough, fevers, chills, night sweats, unintended weight changes. He denies any bowel or bladder disturbances, and denies abdominal pain, nausea or vomiting. He has regained continence. He does have trouble with erectile dysfunction and has tried multiple modalities of therapy. He denies any new musculoskeletal or joint aches or pains. A complete review of systems is obtained and is otherwise negative.     PHYSICAL EXAM:     height is _0  (1.778 m) and weight is 222 lb 12.8 oz (101.1 kg). His oral temperature is 98 F (36.7 C). His blood pressure is 138/70 and his pulse is 61. His respiration is 18.   IPSS score 4 In general this is a well appearing Caucasian male in no acute distress. He's alert and oriented x4 and appropriate throughout the examination. Cardiopulmonary assessment is negative for acute distress and he exhibits normal effort. The patient's left leg is notable for stiches and recent laceration which is healing well with no sign of infection. Abdominal exam notable for a reducible umbilical hernia at his port site.  KPS = 90  100 - Normal; no complaints; no evidence of  disease. 90   - Able to carry on normal activity; minor signs or symptoms of disease. 80   - Normal activity with effort; some signs or symptoms of disease. 72   - Cares for self; unable to carry on normal activity or to do active work. 60   - Requires occasional assistance, but is able to care for most of his personal needs. 50   - Requires considerable assistance and frequent medical care. 26   - Disabled; requires special care and assistance. 36   - Severely disabled; hospital admission is indicated although death not imminent. 54   - Very sick; hospital admission necessary; active supportive treatment necessary. 10   - Moribund; fatal processes progressing rapidly. 0     - Dead  Karnofsky DA, Abelmann Guttenberg, Craver LS and [facility] [phone]) The use of the nitrogen mustards in the palliative treatment of carcinoma: with particular reference to bronchogenic carcinoma Cancer 1 634-56   LABORATORY DATA:  Lab Results  Component Value Date   WBC 7.9 11/27/2015   HGB 12.3 (L) 11/27/2015   HCT 37.5 (L) 11/27/2015   MCV 91.5 11/27/2015   PLT 173 11/27/2015   Lab Results  Component Value Date   NA 136 10/29/2015   K 3.5 10/29/2015   CL 103 10/29/2015   CO2 27 10/29/2015   Lab Results  Component Value Date   ALT 38 10/29/2015   AST 60 (H) 10/29/2015   ALKPHOS 226 (H) 10/29/2015   BILITOT 0.7 10/29/2015     RADIOGRAPHY: Ct Pelvis W Contrast  Result Date: 11/28/2015 CLINICAL DATA:  55 year old with history of prostate cancer and status post robotic prostatectomy and pelvic lymphadenectomy. The patient has complained of right leg swelling with postoperative lymphoceles around the right iliac vessels. Request for CT-guided aspiration/ drainage of these lymphoceles. EXAM: CT PELVIS WITH CONTRAST TECHNIQUE: Multidetector CT imaging of the pelvis was performed using the standard protocol following the bolus administration of intravenous contrast. CONTRAST:  63m ISOVUE-300 IOPAMIDOL  (ISOVUE-300) INJECTION 61% COMPARISON:  10/27/2015 FINDINGS: Planning CT images suggest that the fluid collections or lymphoceles in the right hemipelvis have markedly decreased in size. Therefore, images were obtained with IV contrast. Previously, there were two adjacent fluid collections around the right iliac vessels. Both of these collections have decreased in size. The more cephalad collection on sequence 3, image  22 measures up to 1.8 cm in maximum diameter and previously measured 3.4 cm. The more caudal collection on sequence 2, image 26 now measures 3.1 x 1.6 cm and previously measured 5.3 x 3.8 cm. There continues to be some stranding throughout this right pelvic perivascular area. Index lymph node adjacent to the right iliac artery on sequence 3, image 19 measures 0.8 cm in the short axis and previously measured 1.1 cm in the short axis. Lymph node anterior to the distal right external iliac artery measures 0.9 cm in short axis, previously measured 1.2 cm in short axis. In addition, multiple lymph nodes in the right inguinal region have also decreased in size. Left inguinal lymph node on sequence 3, image 36 measures 1.1 cm in short axis and minimally changed. There continues to be a small fluid collection just lateral to the left external iliac artery on sequence 3, image 22. This fluid collection adjacent to the left external iliac artery may also represent a small lymphocele and measures 2.5 x 1.6 cm and previously measured 3.1 x 2.1 cm. Small fluid collection posterior to the left iliac vessels on sequence 2, image 24 measures 2.1 x 1.1 cm and previously measured 2.0 x 1.3 cm. Iliac arteries are heavily calcified without an aneurysm. No significant compression on the left iliac veins. Difficult to exclude any compression on the right external iliac artery due to insufficient contrast within the venous system on this exam. Small amount of fluid within the urinary bladder with diffuse bladder wall  thickening. Changes compatible with history of prostatectomy. Bowel structures are unremarkable. No evidence for free fluid or free air. Again noted are laminectomy changes at L4. No acute bone abnormality. Patient has a ventral hernia which is only partially visualized but contains fat. IMPRESSION: The fluid collections/lymphoceles adjacent to the right iliac vessels have markedly decreased in size. In addition, the lymph nodes in the right pelvis and right inguinal region have slightly decreased in size. CT-guided aspiration of the pelvic fluid collections was not performed due to these imaging findings. Minimal change in the small fluid collections around the left iliac vessels. These may also represent small postoperative lymphoceles. Electronically Signed   By: Markus Daft M.D.   On: 11/28/2015 09:42      IMPRESSION: His TIc Stage, Gleason's Score, and PSA put him into the high risk group. The patient would be a good candidate for IMRT to the prostate fossa with hormone therapy considering his past 2010 radiation was of low dose and not specifically directed to the prostate.  PLAN: Today we reviewed the findings and workup thus far.  We discussed the natural history of prostate cancer.  We reviewed the implications of T-stage, Gleason's Score, and PSA on decision-making and outcomes in prostate cancer.  We discussed radiation treatment in the management of prostate cancer with regard to the logistics and delivery of external beam radiation treatment. The patient expressed interest in external beam radiotherapy. However, the patient will be referred to Dr. Johney Maine for an umbilical hernia repair. He will undergo CT simulation about 2 weeks after his hernia surgery.  The above documentation reflects my direct findings during this shared patient visit. Please see the separate note by [name] on this date for the remainder of the patient's plan of care.   Carola Rhine, PAC  This document serves as  a record of services personally performed by Shona Simpson, PA-C and Tyler Pita, MD. It was created on their behalf by Darcus Austin,  a trained medical scribe. The creation of this record is based on the scribe's personal observations and the providers' statements to them. This document has been checked and approved by the attending provider.

## 2015-12-18 NOTE — Progress Notes (Addendum)
Radiation Oncology         [phone] ________________________________ Follow Up Consultation  Name: ARCH METHOT MRN: [id number]  Date: 12/18/2015  DOB: 1961/04/23  CC:No primary care provider on file.  [name], MD   REFERRING PHYSICIAN: [name], MD  DIAGNOSIS: 55 y.o. 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 Malignant neoplasm of prostate s/p robotic prostatectomy 07/30/2015 [place] R Mayabb is a 55 y.o. gentleman who has been followed by Dr. Gaynelle Arabian for many years. In 2010 the patient was taken urgently to the operating room for was felt to initially be a testicular torsion however, the patient was diagnosed with a left stage IIB seminoma. Due to the emergent nature of his operation, this is performed with a trans-scrotal approach. Staging workup demonstrated two enlarged periaortic lymph nodes measuring 2.2 and 1.6 cm. He was referred to Dr.Moody for radiotherapy. He was treated between 12/06/08 and 01/03/2009 with 25.5 Gy in 15 fractions to the left hemipelvis and periaortic lymph nodes with a periaortic boost of 35.5 Gy over 20 fractions. The patient has been followed on an annual basis by urology since his diagnosis and has been without evidence of disease. His PSA has been followed as well, and in October 2015 was 3.66, in November 2016, it was 9.63. This was rechecked in December 2016 after a course of antibiotic therapy, and was persistently elevated at 11.2.   On 05/16/2015, the patient underwent biopsy revealing adenocarcinoma in 7/12 biopsy sites. Gleason score was 4+5 in 2 of the sites. His prostate measured 14.9 mL, and did not have any palpable nodules.   He met with Dr. Tammi Klippel on 05/28/15 to discuss the options for radiotherapy. He decided on radical robotic prostatectomy which was performed on 07/30/15. Final pathology revealed 4+3  adenocarcinoma with bilateral external capsular extension, and right apex margin was positive. His pelvic lymph nodes and seminal vesicles were negative for disease.   His postop course has been complicated by the development of a wound dehiscence, hernia, and lymphedema. He has otherwise regained continence. His PSA in July was 0.13, and last week was 0.12 in August 2017. He comes to discuss the rationale for radiotherapy to the prostatic fossa.   PREVIOUS RADIATION THERAPY: Yes, to treat Stage IIb seminoma in 2010, 25.5 Gy with boost to 45.5 Gy (Dr.Moody). Transscrotal orchectomy also performed.              PAST MEDICAL HISTORY:  Past Medical History:  Diagnosis Date  . Alcoholism ([place])   . Anxiety   . Back pain   . Cancer ([place])    testicular  . Cirrhosis of liver ([place])   . Depression   . Dysuria   . E. coli UTI (urinary tract infection) 10/28/2015  . ED (erectile dysfunction)   . Elevated PSA   . History of radiation therapy   . Hypogonadism, testicular   . Hypothyroidism   . Lymphocele   . Neck fracture ([place])   . Perioperative dehiscence of abdominal wound with evisceration 08/07/2015  . Pneumonia    hx of pneumonia x 2   . Premature ejaculation   . Prostate CA ([place])   . Seizure ([place])    pt states last seizure was many years ago and stopped dilantin in 2015  . [facility])    s/p left orchiectomy and xrt  in 2010  . Stenosis, cervical spine   . Thyroid disease    hyperthyroidism  . URI 03/01/2010   Qualifier: Diagnosis of  By: Amil Amen MD, Ervin Knack SURGICAL HISTORY: Past Surgical History:  Procedure Laterality Date  . APPENDECTOMY    . BACK SURGERY    . DENTAL SURGERY    . LEG SURGERY    . LYMPHADENECTOMY Bilateral 07/30/2015   Procedure: BILATERAL PELVIC LYMPHADENECTOMY;  Surgeon: Raynelle Bring, MD;  Location: [facility];  Service: Urology;  Laterality: Bilateral;  . NECK SURGERY    . ORCHIECTOMY Left 2010  . PROSTATE SURGERY    . ROBOT  ASSISTED LAPAROSCOPIC RADICAL PROSTATECTOMY N/A 07/30/2015   Procedure: XI ROBOTIC ASSISTED LAPAROSCOPIC RADICAL PROSTATECTOMY LEVEL 2;  Surgeon: Raynelle Bring, MD;  Location: [facility];  Service: Urology;  Laterality: N/A;  . WOUND EXPLORATION N/A 08/07/2015   Procedure: EXPLORATORY LAPAROTOMY WITH WOUND CLOSURE;  Surgeon: Raynelle Bring, MD;  Location: [facility];  Service: Urology;  Laterality: N/A;    FAMILY HISTORY:  Family History  Problem Relation Age of Onset  . Cancer Father     testicular (per [facility] notes)  . Cancer Paternal Uncle     leukemia    SOCIAL HISTORY:  reports that he has been smoking Cigarettes.  He has a 54.00 pack-year smoking history. He has never used smokeless tobacco. He reports that he drinks alcohol. He reports that he does not use drugs. He is on disability due to his cervical spine abnormalities, and lives with his girlfriend in [place]. His sister accompanies him for today's visit, and she is his healthcare power of attorney.  ALLERGIES: Hydrocodone and Motrin [ibuprofen]  MEDICATIONS:  Current Outpatient Prescriptions  Medication Sig Dispense Refill  . amoxicillin-clavulanate (AUGMENTIN) 875-125 MG tablet Take 1 tablet by mouth 2 (two) times daily. 20 tablet 0  . famotidine (PEPCID) 20 MG tablet Take 1 tablet (20 mg total) by mouth 2 (two) times daily. 60 tablet 0  . furosemide (LASIX) 40 MG tablet Take 40 mg by mouth daily.    Marland Kitchen lactulose (CHRONULAC) 10 GM/15ML solution 30 mLs as needed. Take three times daily.    Marland Kitchen levothyroxine (SYNTHROID, LEVOTHROID) 112 MCG tablet Take 112 mcg by mouth every morning.    Marland Kitchen LORazepam (ATIVAN) 1 MG tablet Take by mouth.    . nicotine (NICODERM CQ - DOSED IN MG/24 HOURS) 21 mg/24hr patch Place 1 patch (21 mg total) onto the skin daily. 28 patch 0  . ondansetron (ZOFRAN) 4 MG tablet Take 1 tablet (4 mg total) by mouth every 8 (eight) hours as needed for nausea or vomiting. 20 tablet 0  . potassium chloride (K-DUR) 10  MEQ tablet Take 10 mEq by mouth daily.    . rifaximin (XIFAXAN) 550 MG TABS tablet Take 550 mg by mouth 2 (two) times daily.    Marland Kitchen spironolactone (ALDACTONE) 50 MG tablet Take 50 mg by mouth 2 (two) times daily.     No current facility-administered medications for this encounter.     REVIEW OF SYSTEMS:  On review of systems the patient reports that his main concern is pain just above his penis. He states that this pain is sharp and shooting and radiates into the left aspect of his scrotum for his testicle has since been removed. He states that this isn't going on for at least a week, and he is currently on antibiotic therapy for urinary tract infection with Dr. Gaynelle Arabian. He  continues to experience episodes of hematuria and dysuria and states that his urine is a slight tinged color due to his antibiotic therapy. He reports that he has had trouble with erectile dysfunction for many years and has tried multiple therapies including Cialis, Viagra which have not been beneficial. He is also used penile injections in the past. He states that he is unable to have or maintain an erection. He reports normal bowel function and denies any abdominal pain, spell early satiety nausea or vomiting. He is not experiencing fevers, chills, unintended weight changes. A complete review of systems is obtained and is otherwise negative.    PHYSICAL EXAM:     vitals were not taken for this visit.  Pain scale 10/10   IPSS score 4 In general this is a well-appearing Caucasian male in no acute distress. He is alert and oriented times for appropriate throughout the examination. HEENT reveals normocephalic atraumatic, EOMs are intact. Lymphatic review does not reveal any palpable adenopathy of the cervical, supraclavicular axillary or inguinal chains. Cardiovascular exam reveals a regular rate and rhythm no clicks rubs or murmurs auscultated. Chest is clear to auscultation bilaterally. Abdomen is intact bowel sounds in all  quadrants and is soft, non tender, non distended without any palpable hepatosplenomegaly or fascial defects. Lower extremities are negative for pitting edema, deep calf tenderness, cyanosis or clubbing. Clubbing is however noted of his digits bilaterally.  KPS = 90  100 - Normal; no complaints; no evidence of disease. 90   - Able to carry on normal activity; minor signs or symptoms of disease. 80   - Normal activity with effort; some signs or symptoms of disease. 63   - Cares for self; unable to carry on normal activity or to do active work. 60   - Requires occasional assistance, but is able to care for most of his personal needs. 50   - Requires considerable assistance and frequent medical care. 63   - Disabled; requires special care and assistance. 43   - Severely disabled; hospital admission is indicated although death not imminent. 51   - Very sick; hospital admission necessary; active supportive treatment necessary. 10   - Moribund; fatal processes progressing rapidly. 0     - Dead  Karnofsky DA, Abelmann [place], Craver LS and Burchenal K [facility] [phone]) The use of the nitrogen mustards in the palliative treatment of carcinoma: with particular reference to bronchogenic carcinoma Cancer 1 [id number]   LABORATORY DATA:  Lab Results  Component Value Date   WBC 7.9 11/27/2015   HGB 12.3 (L) 11/27/2015   HCT 37.5 (L) 11/27/2015   MCV 91.5 11/27/2015   PLT 173 11/27/2015   Lab Results  Component Value Date   NA 136 10/29/2015   K 3.5 10/29/2015   CL 103 10/29/2015   CO2 27 10/29/2015   Lab Results  Component Value Date   ALT 38 10/29/2015   AST 60 (H) 10/29/2015   ALKPHOS 226 (H) 10/29/2015   BILITOT 0.7 10/29/2015     RADIOGRAPHY: Ct Pelvis W Contrast  Result Date: 11/28/2015 CLINICAL DATA:  55 year old with history of prostate cancer and status post robotic prostatectomy and pelvic lymphadenectomy. The patient has complained of right leg swelling with postoperative lymphoceles around the  right iliac vessels. Request for CT-guided aspiration/ drainage of these lymphoceles. EXAM: CT PELVIS WITH CONTRAST TECHNIQUE: Multidetector CT imaging of the pelvis was performed using the standard protocol following the bolus administration of intravenous contrast. CONTRAST:  83m ISOVUE-300 IOPAMIDOL (ISOVUE-300) INJECTION  61% COMPARISON:  10/27/2015 FINDINGS: Planning CT images suggest that the fluid collections or lymphoceles in the right hemipelvis have markedly decreased in size. Therefore, images were obtained with IV contrast. Previously, there were two adjacent fluid collections around the right iliac vessels. Both of these collections have decreased in size. The more cephalad collection on sequence 3, image 22 measures up to 1.8 cm in maximum diameter and previously measured 3.4 cm. The more caudal collection on sequence 2, image 26 now measures 3.1 x 1.6 cm and previously measured 5.3 x 3.8 cm. There continues to be some stranding throughout this right pelvic perivascular area. Index lymph node adjacent to the right iliac artery on sequence 3, image 19 measures 0.8 cm in the short axis and previously measured 1.1 cm in the short axis. Lymph node anterior to the distal right external iliac artery measures 0.9 cm in short axis, previously measured 1.2 cm in short axis. In addition, multiple lymph nodes in the right inguinal region have also decreased in size. Left inguinal lymph node on sequence 3, image 36 measures 1.1 cm in short axis and minimally changed. There continues to be a small fluid collection just lateral to the left external iliac artery on sequence 3, image 22. This fluid collection adjacent to the left external iliac artery may also represent a small lymphocele and measures 2.5 x 1.6 cm and previously measured 3.1 x 2.1 cm. Small fluid collection posterior to the left iliac vessels on sequence 2, image 24 measures 2.1 x 1.1 cm and previously measured 2.0 x 1.3 cm. Iliac arteries are heavily  calcified without an aneurysm. No significant compression on the left iliac veins. Difficult to exclude any compression on the right external iliac artery due to insufficient contrast within the venous system on this exam. Small amount of fluid within the urinary bladder with diffuse bladder wall thickening. Changes compatible with history of prostatectomy. Bowel structures are unremarkable. No evidence for free fluid or free air. Again noted are laminectomy changes at L4. No acute bone abnormality. Patient has a ventral hernia which is only partially visualized but contains fat. IMPRESSION: The fluid collections/lymphoceles adjacent to the right iliac vessels have markedly decreased in size. In addition, the lymph nodes in the right pelvis and right inguinal region have slightly decreased in size. CT-guided aspiration of the pelvic fluid collections was not performed due to these imaging findings. Minimal change in the small fluid collections around the left iliac vessels. These may also represent small postoperative lymphoceles. Electronically Signed   By: Markus Daft M.D.   On: 11/28/2015 09:42      IMPRESSION: His TIc Stage, Gleason's Score, and PSA put him into the high risk group.  The patient would be a good candidate for IMRT to the prostate with hormone therapy considering past 2010 radiation was of low dose and not specifically directed to the prostate.   PLAN:Today we reviewed the findings and workup thus far.  We discussed the natural history of prostate cancer.  We reviewed the implications of T-stage, Gleason's Score, and PSA on decision-making and outcomes in prostate cancer.  We discussed radiation treatment in the management of prostate cancer with regard to the logistics and delivery of external beam radiation treatment as well as the logistics and delivery of prostate brachytherapy.  He seems to be best suited for external radiation therapy rather than radiotherapy due to his low prosthetic  volume, and we would recommend that he consider androgen deprivation therapy if he elects  to undergo external beam radiation therapy. Dr. Tammi Klippel discusses that he would recommend 2 months of ADT followed by placement of gold fiducial markers and subsequent simulation and treatment again if the patient elects to go this route. We compared and contrasted each of these approaches and also compared these against prostatectomy.  The patient expressed interest in external beam radiotherapy, but we would recommend that he also  meet with Dr. Alinda Money next week to determine if surgical intervention is an option.  Dr. Tammi Klippel discusses that this may not be indicated as the patient has previously undergone pelvic radiation therapy, and has a history of cirrhotic changes of the liver. Dr. Tammi Klippel has filled out a patient counseling form for him with relevant treatment diagrams and we retained a copy for our records.   Also of note the patient's sister states that he is due for colonoscopy, and we would recommend that he undergo this prior to radiotherapy. The patient and his sister states agreement and understanding. In addition, given the patient's persistent pain in the pelvic area, he will follow-up with Dr. Gaynelle Arabian for further recommendations of care.    The above documentation reflects my direct findings during this shared patient visit. Please see the separate note by Dr. Tammi Klippel on this date for the remainder of the patient's plan of care.     Carola Rhine, PAC

## 2015-12-18 NOTE — Progress Notes (Signed)
Reason for Referral: Prostate cancer.   HPI: 55 year old gentleman with prostate cancer diagnosed in April 2017. At that time his PSA was up to 11 and he had a Gleason score 4+5 = 9 on a biopsy. He underwent a radical prostatectomy on 07/30/2015. His final pathology showed acid adenocarcinoma with a Gleason score 4+3 = 7 with a right and left extracapsular extension. Focal right apex margins were involved as well as negative lymph gland involvement. He had a total of 15 lymph node sampled and none were involved with malignancy. His right and left seminal vesicles were free of tumor. Patient had a complicated postoperative course and developed right lower extremity edema in July 2017. His workup was negative for DVT but a CT scan of the abdomen showed hydroceles which subsequently decreased in size on subsequent imaging studying in 11/27/2015. He continued to have persistent incisional hernia after one dehiscence postoperatively. Patient was seen today in the prostate cancer multidisciplinary clinic for discussion for adjuvant therapy. His most recent PSA was down to 0.13 in August 2017. Clinically he reports his lower extremity edema have improved although he did suffer a left leg laceration from an accident. Otherwise he has no complaints at this time. He reports some erectile dysfunction as well as incontinence which is improving.  He does not report any headaches, blurry vision, syncope or seizures. He does not report any fevers chills or sweats. He does not report any cough, wheezing or hemoptysis. Is not reporting nausea, vomiting or abdominal pain. He does not report any frequency urgency or hesitancy. Remaining review of system is unremarkable.   Past Medical History:  Diagnosis Date  . Alcoholism ([place])   . Anxiety   . Back pain   . Cancer ([place])    testicular  . Cirrhosis of liver ([place])   . Depression   . Dysuria   . E. coli UTI (urinary tract infection) 10/28/2015  . ED (erectile  dysfunction)   . Elevated PSA   . History of radiation therapy   . Hypogonadism, testicular   . Hypothyroidism   . Lymphocele   . Neck fracture ([place])   . Perioperative dehiscence of abdominal wound with evisceration 08/07/2015  . Pneumonia    hx of pneumonia x 2   . Premature ejaculation   . Prostate CA ([place])   . Seizure ([place])    pt states last seizure was many years ago and stopped dilantin in 2015  . Semin[facility])    s/p left orchiectomy and xrt in 2010  . Stenosis, cervical spine   . Thyroid disease    hyperthyroidism  . URI 03/01/2010   Qualifier: Diagnosis of  By: Amil Amen MD, Benjamine Mola    :  Past Surgical History:  Procedure Laterality Date  . APPENDECTOMY    . BACK SURGERY    . DENTAL SURGERY    . LEG SURGERY    . LYMPHADENECTOMY Bilateral 07/30/2015   Procedure: BILATERAL PELVIC LYMPHADENECTOMY;  Surgeon: Raynelle Bring, MD;  Location: [facility];  Service: Urology;  Laterality: Bilateral;  . NECK SURGERY    . ORCHIECTOMY Left 2010  . PROSTATE SURGERY    . ROBOT ASSISTED LAPAROSCOPIC RADICAL PROSTATECTOMY N/A 07/30/2015   Procedure: XI ROBOTIC ASSISTED LAPAROSCOPIC RADICAL PROSTATECTOMY LEVEL 2;  Surgeon: Raynelle Bring, MD;  Location[facility];  Service: Urology;  Laterality: N/A;  . WOUND EXPLORATION N/A 08/07/2015   Procedure: EXPLORATORY LAPAROTOMY WITH WOUND CLOSURE;  Surgeon: Raynelle Bring, MD;  [facility];  Service: Urology;  Laterality: N/A;  :   Current Outpatient Prescriptions:  .  amoxicillin-clavulanate (AUGMENTIN) 875-125 MG tablet, Take 1 tablet by mouth 2 (two) times daily. (Patient not taking: Reported on 12/18/2015), Disp: 20 tablet, Rfl: 0 .  cephALEXin (KEFLEX) 500 MG capsule, Take 500 mg by mouth daily., Disp: , Rfl:  .  famotidine (PEPCID) 20 MG tablet, Take 1 tablet (20 mg total) by mouth 2 (two) times daily. (Patient not taking: Reported on 12/18/2015), Disp: 60 tablet, Rfl: 0 .  furosemide (LASIX) 40 MG tablet, Take 40 mg by mouth daily., Disp: ,  Rfl:  .  lactulose (CHRONULAC) 10 GM/15ML solution, 30 mLs as needed. Take three times daily., Disp: , Rfl:  .  levothyroxine (SYNTHROID, LEVOTHROID) 112 MCG tablet, Take 112 mcg by mouth every morning., Disp: , Rfl:  .  LORazepam (ATIVAN) 1 MG tablet, Take by mouth., Disp: , Rfl:  .  nicotine (NICODERM CQ - DOSED IN MG/24 HOURS) 21 mg/24hr patch, Place 1 patch (21 mg total) onto the skin daily. (Patient not taking: Reported on 12/18/2015), Disp: 28 patch, Rfl: 0 .  ondansetron (ZOFRAN) 4 MG tablet, Take 1 tablet (4 mg total) by mouth every 8 (eight) hours as needed for nausea or vomiting. (Patient not taking: Reported on 12/18/2015), Disp: 20 tablet, Rfl: 0 .  potassium chloride (K-DUR) 10 MEQ tablet, Take 10 mEq by mouth daily., Disp: , Rfl:  .  rifaximin (XIFAXAN) 550 MG TABS tablet, Take 550 mg by mouth 2 (two) times daily., Disp: , Rfl:  .  spironolactone (ALDACTONE) 50 MG tablet, Take 50 mg by mouth 2 (two) times daily., Disp: , Rfl: :  Allergies  Allergen Reactions  . Hydrocodone Nausea And Vomiting  . Motrin [Ibuprofen]     Avoids due to liver cirrhosis.  :  Family History  Problem Relation Age of Onset  . Cancer Father     testicular (per [facility] notes)  . Cancer Paternal Uncle     leukemia  :  Social History   Social History  . Marital status: Single    Spouse name: N/A  . Number of children: N/A  . Years of education: N/A   Occupational History  . Not on file.   Social History Main Topics  . Smoking status: Current Every Day Smoker    Packs/day: 1.50    Years: 36.00    Types: Cigarettes  . Smokeless tobacco: Never Used  . Alcohol use Yes     Comment: quit 2012   . Drug use: No  . Sexual activity: Yes   Other Topics Concern  . Not on file   Social History Narrative  . No narrative on file  :  Pertinent items are noted in HPI.  Exam: ECOG 1 General appearance: alert and cooperative appeared without distress. Head: Normocephalic, without obvious  abnormality Throat: No oral thrush noted.  Neck: no adenopathy Back: negative Resp: clear to auscultation bilaterally Chest wall: no tenderness Cardio: regular rate and rhythm, S1, S2 normal, no murmur, click, rub or gallop GI: soft, non-tender; bowel sounds normal; no masses,  no organomegaly Extremities: Trace lower extremity edema noted. Laceration noted on the lateral aspect of his left calf. Mild erythema noted around his sutures. Pulses: 2+ and symmetric Skin: Skin color, texture, turgor normal. No rashes or lesions  CBC    Component Value Date/Time   WBC 7.9 11/27/2015 0918   RBC 4.10 (L) 11/27/2015 0918   HGB 12.3 (L) 11/27/2015 0918   HGB 14.6  10/28/2013 0806   HCT 37.5 (L) 11/27/2015 0918   HCT 41.4 10/28/2013 0806   PLT 173 11/27/2015 0918   PLT 148 (L) 10/28/2013 0806   MCV 91.5 11/27/2015 0918   MCV 99 10/28/2013 0806   MCH 30.0 11/27/2015 0918   MCHC 32.8 11/27/2015 0918   RDW 15.4 11/27/2015 0918   RDW 14.0 10/28/2013 0806   LYMPHSABS 0.5 (L) 10/27/2015 1250   LYMPHSABS 1.6 05/12/2012 1147   MONOABS 0.6 10/27/2015 1250   MONOABS 0.8 05/12/2012 1147   EOSABS 0.1 10/27/2015 1250   EOSABS 0.3 05/12/2012 1147   BASOSABS 0.0 10/27/2015 1250   BASOSABS 0.1 05/12/2012 1147     Ct Pelvis W Contrast  Result Date: 11/28/2015 CLINICAL DATA:  55 year old with history of prostate cancer and status post robotic prostatectomy and pelvic lymphadenectomy. The patient has complained of right leg swelling with postoperative lymphoceles around the right iliac vessels. Request for CT-guided aspiration/ drainage of these lymphoceles. EXAM: CT PELVIS WITH CONTRAST TECHNIQUE: Multidetector CT imaging of the pelvis was performed using the standard protocol following the bolus administration of intravenous contrast. CONTRAST:  59mL ISOVUE-300 IOPAMIDOL (ISOVUE-300) INJECTION 61% COMPARISON:  10/27/2015 FINDINGS: Planning CT images suggest that the fluid collections or lymphoceles in the  right hemipelvis have markedly decreased in size. Therefore, images were obtained with IV contrast. Previously, there were two adjacent fluid collections around the right iliac vessels. Both of these collections have decreased in size. The more cephalad collection on sequence 3, image 22 measures up to 1.8 cm in maximum diameter and previously measured 3.4 cm. The more caudal collection on sequence 2, image 26 now measures 3.1 x 1.6 cm and previously measured 5.3 x 3.8 cm. There continues to be some stranding throughout this right pelvic perivascular area. Index lymph node adjacent to the right iliac artery on sequence 3, image 19 measures 0.8 cm in the short axis and previously measured 1.1 cm in the short axis. Lymph node anterior to the distal right external iliac artery measures 0.9 cm in short axis, previously measured 1.2 cm in short axis. In addition, multiple lymph nodes in the right inguinal region have also decreased in size. Left inguinal lymph node on sequence 3, image 36 measures 1.1 cm in short axis and minimally changed. There continues to be a small fluid collection just lateral to the left external iliac artery on sequence 3, image 22. This fluid collection adjacent to the left external iliac artery may also represent a small lymphocele and measures 2.5 x 1.6 cm and previously measured 3.1 x 2.1 cm. Small fluid collection posterior to the left iliac vessels on sequence 2, image 24 measures 2.1 x 1.1 cm and previously measured 2.0 x 1.3 cm. Iliac arteries are heavily calcified without an aneurysm. No significant compression on the left iliac veins. Difficult to exclude any compression on the right external iliac artery due to insufficient contrast within the venous system on this exam. Small amount of fluid within the urinary bladder with diffuse bladder wall thickening. Changes compatible with history of prostatectomy. Bowel structures are unremarkable. No evidence for free fluid or free air. Again  noted are laminectomy changes at L4. No acute bone abnormality. Patient has a ventral hernia which is only partially visualized but contains fat. IMPRESSION: The fluid collections/lymphoceles adjacent to the right iliac vessels have markedly decreased in size. In addition, the lymph nodes in the right pelvis and right inguinal region have slightly decreased in size. CT-guided aspiration of the pelvic  fluid collections was not performed due to these imaging findings. Minimal change in the small fluid collections around the left iliac vessels. These may also represent small postoperative lymphoceles. Electronically Signed   By: Markus Daft M.D.   On: 11/28/2015 09:42    Assessment and Plan:   55 year old gentleman with the following issues:  1. Prostate cancer diagnosed in April 2017. He initially had a Gleason score 4+5 = 9 and subsequently underwent a prostatectomy with a preoperative PSA of 11. His pathology revealed a prostate adenocarcinoma with a Gleason score 4+3 = 7 with right and left extracapsular extension and focal margins involved. The final pathological staging was T3a N0. Imaging studies failed to show any evidence of metastatic disease.  The natural course of this disease was discussed today as well as the treatment options. Imaging studies as well as pathology results were discussed with radiology and pathology respectively. This treatment option at this time include observation and surveillance versus additional radiation therapy given his positive margins. The rationale for adding 6 months of ADT therapy given his high-risk disease were reviewed. I explained to him that these are recommendations according to express in this field and in compliance with oncology pathways.  After discussing the risks and benefits of androgen deprivation therapy he declined that option. He is open to have radiation therapy for adjuvant/salvage purposes.  2. 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 His prostatic fossa is not involved and the previous fields should be candidate to receive additional therapy. He has no evidence of recurrent disease at this time.  3. Lower extremity edema: Appears to be improving at this time and his fluid collection/lymphoceles around the right iliac vein appears to be improving based on the last CT scan reviewed on 11/27/2015.  4. Cirrhosis of the liver: He has no stigmata as of decompensated liver disease at this time. And has not complication for his cancer treatment at this time.

## 2015-12-19 NOTE — Progress Notes (Signed)
[place] Psychosocial Distress Screening Spiritual Care  Met with Clelia Schaumann, and sister [name] in [facility] to introduce [place] team/resources, reviewing distress screen per protocol.  The patient scored a 5 on the Psychosocial Distress Thermometer which indicates moderate distress. Also assessed for distress and other psychosocial needs.   ONCBCN DISTRESS SCREENING 12/18/2015  Screening Type Initial Screening  Distress experienced in past week (1-10) 5  Emotional problem type Nervousness/Anxiety  Information Concerns Type   Physical Problem type Pain;Nausea/vomiting;Sleep/insomnia;Tingling hands/feet;Sexual problems;Swollen arms/legs  Physician notified of physical symptoms Yes  Referral to clinical psychology   Referral to clinical social work Yes   Per pt, he is not particularly worried about the outcome of this dx/tx, but rather by the fatigue of "everything that could go wrong, has gone wrong." Per pt, he is past ready to resume some sense of normalcy and independence after a series of health problems (neck surgery, back surgery, hx testicular ca, recent significant leg injury, etc).  He also notes that he finds reasons to laugh every day and has good support from his older sister and girlfriend.  Encouraged [place] resources for caregivers as well as pt.  Follow up needed: No.  Mr Gondek reports no other needs/interests at this time and is aware of ongoing Support Team availability.  Please also page if needs arise/circumstances change.  Thank you.  [place], [place], [facility] Pager [phone] Voicemail [phone]

## 2015-12-20 ENCOUNTER — Emergency Department (HOSPITAL_COMMUNITY)
Admission: EM | Admit: 2015-12-20 | Discharge: 2015-12-20 | Disposition: A | Discharge status: Home / Self Care | Payer: Medicaid Other | Attending: Emergency Medicine | Admitting: Emergency Medicine

## 2015-12-20 ENCOUNTER — Encounter (HOSPITAL_COMMUNITY): Payer: Self-pay | Admitting: Emergency Medicine

## 2015-12-20 DIAGNOSIS — Z79899 Other long term (current) drug therapy: Secondary | ICD-10-CM | POA: Diagnosis not present

## 2015-12-20 DIAGNOSIS — L03116 Cellulitis of left lower limb: Secondary | ICD-10-CM

## 2015-12-20 DIAGNOSIS — Z4802 Encounter for removal of sutures: Secondary | ICD-10-CM

## 2015-12-20 DIAGNOSIS — Z8547 Personal history of malignant neoplasm of testis: Secondary | ICD-10-CM | POA: Insufficient documentation

## 2015-12-20 DIAGNOSIS — R509 Fever, unspecified: Secondary | ICD-10-CM | POA: Diagnosis present

## 2015-12-20 DIAGNOSIS — F17210 Nicotine dependence, cigarettes, uncomplicated: Secondary | ICD-10-CM | POA: Insufficient documentation

## 2015-12-20 DIAGNOSIS — E039 Hypothyroidism, unspecified: Secondary | ICD-10-CM | POA: Insufficient documentation

## 2015-12-20 LAB — CBC WITH DIFFERENTIAL/PLATELET
Basophils Absolute: 0 K/uL (ref 0.0–0.1)
Basophils Relative: 1 %
Eosinophils Absolute: 0.2 K/uL (ref 0.0–0.7)
Eosinophils Relative: 4 %
HCT: 40.2 % (ref 39.0–52.0)
Hemoglobin: 13.1 g/dL (ref 13.0–17.0)
Lymphocytes Relative: 17 %
Lymphs Abs: 1 K/uL (ref 0.7–4.0)
MCH: 30.8 pg (ref 26.0–34.0)
MCHC: 32.6 g/dL (ref 30.0–36.0)
MCV: 94.4 fL (ref 78.0–100.0)
Monocytes Absolute: 0.4 K/uL (ref 0.1–1.0)
Monocytes Relative: 7 %
Neutro Abs: 4.1 K/uL (ref 1.7–7.7)
Neutrophils Relative %: 71 %
Platelets: 162 K/uL (ref 150–400)
RBC: 4.26 MIL/uL (ref 4.22–5.81)
RDW: 16.3 % — ABNORMAL HIGH (ref 11.5–15.5)
WBC: 5.7 K/uL (ref 4.0–10.5)

## 2015-12-20 LAB — I-STAT CHEM 8, ED
BUN: 21 mg/dL — ABNORMAL HIGH (ref 6–20)
Calcium, Ion: 1.17 mmol/L (ref 1.13–1.30)
Chloride: 105 mmol/L (ref 101–111)
Creatinine, Ser: 1 mg/dL (ref 0.61–1.24)
Glucose, Bld: 111 mg/dL — ABNORMAL HIGH (ref 65–99)
HCT: 41 % (ref 39.0–52.0)
Hemoglobin: 13.9 g/dL (ref 13.0–17.0)
Potassium: 3.9 mmol/L (ref 3.5–5.1)
Sodium: 142 mmol/L (ref 135–145)
TCO2: 25 mmol/L (ref 0–100)

## 2015-12-20 LAB — I-STAT CREATININE, ED: Creatinine, Ser: 1 mg/dL (ref 0.61–1.24)

## 2015-12-20 MED ORDER — OXYCODONE-ACETAMINOPHEN 5-325 MG PO TABS
1.0000 | ORAL_TABLET | Freq: Once | ORAL | Status: AC
  Administered 2015-12-20: 1 via ORAL
  Filled 2015-12-20: qty 1

## 2015-12-20 MED ORDER — SULFAMETHOXAZOLE-TRIMETHOPRIM 800-160 MG PO TABS: 1.0000 | ORAL_TABLET | Freq: Two times a day (BID) | ORAL | 0 refills | Status: AC

## 2015-12-20 MED ORDER — OXYCODONE-ACETAMINOPHEN 5-325 MG PO TABS: 2.0000 | ORAL_TABLET | ORAL | 0 refills | Status: DC | PRN

## 2015-12-20 NOTE — ED Provider Notes (Signed)
[facility] DEPT Provider Note   CSN: [id number] Arrival date & time: 12/20/15  0932     History   Chief Complaint Chief Complaint  Patient presents with  . Leg Pain  . Fever  . Suture / Staple Removal    possible. states they were placed 12/05/2015    HPI [name] is a 55 y.o. male with a past medical history of EtOH abuse, cirrhosis, prostate cancer, seizures who presents to the ED today complaining of left leg pain. Patient states that he had a deep laceration to his left lower extremity that occurred on August 9. He was seen at [facility] ED for this and was given multiple sutures. Patient states she was also placed on Keflex which she is still currently taking. He states since then the area around the sutures has become very red and very tender causing his whole leg to hurt. Patient states he cannot sleep at night due to the pain despite taking oxycodone. He also reports fevers at home up to 100F. Patient states that he is supposed to start radiation therapy for prostate cancer soon. He had a radical prostatectomy in April 2017.  HPI  Past Medical History:  Diagnosis Date  . Alcoholism ([place])   . Anxiety   . Back pain   . Cancer ([place])    testicular  . Cirrhosis of liver ([place])   . Depression   . Dysuria   . E. coli UTI (urinary tract infection) 10/28/2015  . ED (erectile dysfunction)   . Elevated PSA   . History of radiation therapy   . Hypogonadism, testicular   . Hypothyroidism   . Lymphocele   . Neck fracture ([place])   . Perioperative dehiscence of abdominal wound with evisceration 08/07/2015  . Pneumonia    hx of pneumonia x 2   . Premature ejaculation   . Prostate CA ([place])   . Seizure ([place])    pt states last seizure was many years ago and stopped dilantin in 2015  . [facility])    s/p left orchiectomy and xrt in 2010  . Stenosis, cervical spine   . Thyroid disease    hyperthyroidism  . URI 03/01/2010   Qualifier: Diagnosis of  By: Amil Amen MD,  [facility]      Patient Active Problem List   Diagnosis Date Noted  . Cellulitis and abscess of leg 10/28/2015  . Seminoma of left testis s/p orchiectomy 2010 10/28/2015  . Recurrent ventral incisional hernia 10/28/2015  . 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 Tobacco abuse 10/28/2015  . Diastasis recti 10/28/2015  . Leg wound, right, chronic 10/28/2015  . Obesity (BMI 30-39.9) 10/28/2015  . History of radiation therapy to retroperitoneum 10/28/2015  . h/o E. coli UTI (urinary tract infection) 10/28/2015  . Cellulitis 10/28/2015  . Dehiscence of surgical wound s/p re-repair 08/07/2015 08/09/2015  . Malignant neoplasm of prostate s/p robotic prostatectomy 07/30/2015 05/29/2015  . Anxiety 01/18/2013  . [place] DISEASE, LUMBAR SPINE 02/17/2010  . 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 DEPRESSION 10/31/2009  . ANEMIA 08/06/2009  . 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 HYPOTHYROIDISM, POSTABLATION 09/13/2008  . ABUSE, ALCOHOL, IN REMISSION 12/17/2006  . Hepatic cirrhosis ([place]) 12/17/2006  . SEIZURE DISORDER 12/17/2006  . HX, PNEUMONIA 12/17/2006    Past Surgical History:  Procedure Laterality Date  . APPENDECTOMY    . BACK SURGERY    . DENTAL SURGERY    . LEG  SURGERY    . LYMPHADENECTOMY Bilateral 07/30/2015   Procedure: BILATERAL PELVIC LYMPHADENECTOMY;  Surgeon: Raynelle Bring, MD;  [facility];  Service: Urology;  Laterality: Bilateral;  . NECK SURGERY    . ORCHIECTOMY Left 2010  . PROSTATE SURGERY    . ROBOT ASSISTED LAPAROSCOPIC RADICAL PROSTATECTOMY N/A 07/30/2015   Procedure: XI ROBOTIC ASSISTED LAPAROSCOPIC RADICAL PROSTATECTOMY LEVEL 2;  Surgeon: Raynelle Bring, MD;  [facility];  Service: Urology;  Laterality: N/A;  . WOUND EXPLORATION N/A 08/07/2015   Procedure: EXPLORATORY LAPAROTOMY WITH WOUND CLOSURE;  Surgeon: Raynelle Bring, MD;  [facility];  Service: Urology;  Laterality:  N/A;       Home Medications    Prior to Admission medications   Medication Sig Start Date End Date Taking? Authorizing Provider  cephALEXin (KEFLEX) 500 MG capsule Take 500 mg by mouth 2 (two) times daily. Started 08/09 for unknown day's supply 12/05/15  Yes Historical Provider, MD  furosemide (LASIX) 40 MG tablet Take 40 mg by mouth daily.   Yes Historical Provider, MD  levothyroxine (SYNTHROID, LEVOTHROID) 112 MCG tablet Take 112 mcg by mouth every morning.   Yes Historical Provider, MD  potassium chloride (K-DUR) 10 MEQ tablet Take 10 mEq by mouth daily.   Yes Historical Provider, MD  rifaximin (XIFAXAN) 550 MG TABS tablet Take 550 mg by mouth 2 (two) times daily.   Yes Historical Provider, MD  spironolactone (ALDACTONE) 50 MG tablet Take 50 mg by mouth 2 (two) times daily.   Yes Historical Provider, MD  amoxicillin-clavulanate (AUGMENTIN) 875-125 MG tablet Take 1 tablet by mouth 2 (two) times daily. Patient not taking: Reported on 12/18/2015 10/29/15   Jani Gravel, MD  famotidine (PEPCID) 20 MG tablet Take 1 tablet (20 mg total) by mouth 2 (two) times daily. Patient not taking: Reported on 12/18/2015 10/29/15   Jani Gravel, MD  nicotine (NICODERM CQ - DOSED IN MG/24 HOURS) 21 mg/24hr patch Place 1 patch (21 mg total) onto the skin daily. Patient not taking: Reported on 12/18/2015 10/29/15   Jani Gravel, MD  ondansetron (ZOFRAN) 4 MG tablet Take 1 tablet (4 mg total) by mouth every 8 (eight) hours as needed for nausea or vomiting. Patient not taking: Reported on 12/18/2015 10/29/15   Jani Gravel, MD    Family History Family History  Problem Relation Age of Onset  . Cancer Father     testicular (per [facility] notes)  . Cancer Paternal Uncle     leukemia    Social History Social History  Substance Use Topics  . Smoking status: Current Every Day Smoker    Packs/day: 1.00    Years: 36.00    Types: Cigarettes  . Smokeless tobacco: Never Used  . Alcohol use No     Comment: quit 2012       Allergies   Hydrocodone and Motrin [ibuprofen]   Review of Systems Review of Systems  All other systems reviewed and are negative.    Physical Exam Updated Vital Signs BP 193/83 (BP Location: Right Arm)   Pulse 75   Temp 98 F (36.7 C) (Oral)   Resp 17   SpO2 100%   Physical Exam  Constitutional: He is oriented to person, place, and time. He appears well-developed and well-nourished. No distress.  HENT:  Head: Normocephalic and atraumatic.  Mouth/Throat: No oropharyngeal exudate.  Eyes: Conjunctivae and EOM are normal. Pupils are equal, round, and reactive to light. Right eye exhibits no discharge. Left eye exhibits no discharge. No  scleral icterus.  Cardiovascular: Normal rate, regular rhythm, normal heart sounds and intact distal pulses.  Exam reveals no gallop and no friction rub.   No murmur heard. Pulmonary/Chest: Effort normal and breath sounds normal. No respiratory distress. He has no wheezes. He has no rales. He exhibits no tenderness.  Abdominal: Soft. He exhibits no distension. There is no tenderness. There is no guarding.  Musculoskeletal: Normal range of motion. He exhibits no edema.  Neurological: He is alert and oriented to person, place, and time.  Skin: Skin is warm and dry. No rash noted. He is not diaphoretic. No erythema. No pallor.  Large, 6cm wound on posterior LLE just inferior to left popliteal fossa. Sutures intact. Mild surrounding cellulitis with significant TTP. Serosanguinous fluid drainage from wound.  Psychiatric: He has a normal mood and affect. His behavior is normal.  Nursing note and vitals reviewed.    ED Treatments / Results  Labs (all labs ordered are listed, but only abnormal results are displayed) Labs Reviewed  CBC WITH DIFFERENTIAL/PLATELET - Abnormal; Notable for the following:       Result Value   RDW 16.3 (*)    All other components within normal limits  I-STAT CHEM 8, ED - Abnormal; Notable for the following:    BUN  21 (*)    Glucose, Bld 111 (*)    All other components within normal limits  I-STAT CREATININE, ED    EKG  EKG Interpretation None       Radiology No results found.  Procedures Procedures (including critical care time)  SUTURE REMOVAL Performed by: Carlos Levering  Consent: Verbal consent obtained. Patient identity confirmed: provided demographic data Time out: Immediately prior to procedure a "time out" was called to verify the correct patient, procedure, equipment, support staff and site/side marked as required.  Location details: LLE  Wound Appearance: mild surrounding cellulitis  Sutures/Staples Removed: 1 running stitch  Facility: sutures placed at outside facility Patient tolerance: Patient tolerated the procedure well with no immediate complications.     Medications Ordered in ED Medications  oxyCODONE-acetaminophen (PERCOCET/ROXICET) 5-325 MG per tablet 1 tablet (1 tablet Oral Given 12/20/15 1029)     Initial Impression / Assessment and Plan / ED Course  I have reviewed the triage vital signs and the nursing notes.  Pertinent labs & imaging results that were available during my care of the patient were reviewed by me and considered in my medical decision making (see chart for details).  Clinical Course    55 y.o M with pmhx of prostate Ca presents to the ED c/o wound infection and suture removal. Pt reports deep laceration to LLE that occurred 2 weeks ago. Sutures were placed at outside hospital. Pt has been taking Keflex but states that the wound appears infected and is still causing him significant pain. On exam, pt is non-toxic and non-septic appearing. Afebrile. Wound does have some surrounding cellulitis and is TTP. No leukocytosis. NO sign of systemic infection. Sutures removed in ED, pt tolerated well. Pain managed. Will place pt on bactrim. Recommend follow up with PCP in 3 days for a wound re-check. Return precautions outlined in patient  discharge instructions.   Case discussed with Dr. Oleta Mouse who agrees with treatment plan.  Final Clinical Impressions(s) / ED Diagnoses   Final diagnoses:  Cellulitis of left lower extremity  Visit for suture removal    New Prescriptions New Prescriptions   No medications on file     Carlos Levering, PA-C 12/23/15  2127    Forde Dandy, MD 12/24/15 [phone]

## 2015-12-20 NOTE — ED Triage Notes (Signed)
Pt reports that he is currently on antibiotic Keflex for the laceration and is due to have radiation beginning in Oct.
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
## 2015-12-20 NOTE — Discharge Instructions (Signed)
Take antibiotics as prescribed. Apply antibiotic ointment to wound daily and keep clean and dry. Follow up with your primary care provider next week for a wound re-check. Return to the ED if you experience severe worsening of your symptoms, increased redness or swelling around wound, fevers or chills.

## 2015-12-20 NOTE — ED Notes (Signed)
Verbalized understanding discharge instructions, prescriptions, and follow-up. In no acute distress.   

## 2015-12-20 NOTE — ED Notes (Signed)
PA at bedside.

## 2015-12-21 ENCOUNTER — Encounter: Payer: Self-pay | Admitting: Medical Oncology

## 2015-12-23 NOTE — Progress Notes (Signed)
  Radiation Oncology         [phone]) [phone] ________________________________  Name: [name] MRN: [id number]  Date: 12/24/2015  DOB: [birth year]  SIMULATION AND TREATMENT PLANNING NOTE  Rescheduled, patient likely to pursue umbilical hernia repair first.

## 2015-12-24 ENCOUNTER — Telehealth: Payer: Self-pay | Admitting: Medical Oncology

## 2015-12-24 ENCOUNTER — Encounter: Payer: Self-pay | Admitting: Medical Oncology

## 2015-12-24 ENCOUNTER — Ambulatory Visit
Admission: RE | Admit: 2015-12-24 | Discharge: 2015-12-24 | Disposition: A | Discharge status: Home / Self Care | Payer: Medicaid Other | Source: Ambulatory Visit | Attending: Radiation Oncology | Admitting: Radiation Oncology

## 2015-12-24 DIAGNOSIS — C61 Malignant neoplasm of prostate: Secondary | ICD-10-CM

## 2015-12-24 NOTE — Telephone Encounter (Signed)
Spoke with Lisa-sister and she states that Dr. Johney Maine' office did call her this afternoon. The nurse stated that Dr. Johney Maine had dictated in his noted that pt had to stop smoking. She states that patient told Dr. Johney Maine at his last office visit he had not intentions of quitting. Dr. Johney Maine then said he would get surgery arranged. Dr. Johney Maine is in surgery today but the office will contact them this evening or tomorrow with an answer about surgery. [name] will contact me when she hears from them.
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
## 2015-12-24 NOTE — Progress Notes (Signed)
12/21/15- I left a message with surgery scheduling at Dr. Johney Maine' office. Pt needs a hernia repair before he can begin radiation. I left a message 12/18/15. I made contact with patient and he has not been contacted as of today regarding an appointment.

## 2015-12-24 NOTE — Telephone Encounter (Signed)
12/18/15- I called Dr. Johney Maine' office and left a message with surgery scheduling to inquire about hernia repair. Per patient and his sister they were informed at his last visit they would be contacted with a date once insurance approval was granted. It was about 2 months ago. I requested a return call.

## 2015-12-24 NOTE — Telephone Encounter (Signed)
Spoke with surgery scheduling in  Dr. Johney Maine' office. She asked if patient has stopped smoking because Dr. Johney Maine refused to do his surgery unless he quits. I informed her that as of clinic 12/18/15 he was smoking. I was informed Dr. Johney Maine is waiting for Dr. Lynne Logan office to return a release form since patient is post prostatectomy  08/07/15. I explained that patient and his sister state they have been waiting 2 months regarding a date. I explained that he needs radiation and we can not begin until he gets the hernia repaired. A new release form will be faxed to Dr. Alinda Money and they will contact the patient.

## 2015-12-24 NOTE — Telephone Encounter (Signed)
Spoke with [name] (sister) regarding CT simulation appointment this morning. She states that patient did not come because he was under the impression he needs to get his hernia repaired. I told her she is correct and Dr. Tammi Klippel would like to move the CT simulation out 6 weeks. I asked if they have heard from Dr. Johney Maine' office and she states no. I informed her that I have not either and will follow up with them today. She is aware radiation will call with new CT sim appointment.

## 2015-12-28 ENCOUNTER — Encounter: Payer: Self-pay | Admitting: Medical Oncology

## 2016-01-01 NOTE — Progress Notes (Signed)
I spoke with Lisa-sister to inform her that Dr. Johney Maine contacted Dr. Tammi Klippel and feels it is safe to move forward with radiation. Once he has completed the radiation he will reconsider repairing the hernia. I informed her that CT simulation will contact her with an earlier CT appointment. She voiced understanding.

## 2016-01-03 ENCOUNTER — Telehealth: Payer: Self-pay | Admitting: Medical Oncology

## 2016-01-03 ENCOUNTER — Encounter: Payer: Self-pay | Admitting: Medical Oncology

## 2016-01-03 NOTE — Telephone Encounter (Signed)
I spoke with Mr. Bergschneider and he states that he has not heard from Dr. Johney Maine' office. I informed him that I spoke with Jenell Milliner regarding changing surgeons.We discussed why Dr. Johney Maine is concerning about his smoking and the healing process. He states he has a better understanding of his concern, but not sure of quitting. I also informed him that I spoke with Shona Simpson, PA with Dr. Tammi Klippel and she feels he needs to move forward with radiation while we working on getting him a Museum/gallery conservator.He has an appointment tomorrow at 1 pm for CT simulation. He states that he will keep this appointment

## 2016-01-03 NOTE — Telephone Encounter (Signed)
Alisha with Dr. Johney Maine called stating that Dr. Johney Maine and Dr. Tammi Klippel have been in contact by office e-mail. She is not sure where we stand. I explained to her that per Dr. Tammi Klippel we are moving forward with radiation and once it is completed Dr. Johney Maine will reconsider repairing the hernia.He is scheduled for CT simulation tomorrow. I informed her that I have a voicemail from patient and his friend Otila Kluver stating he would like to get the hernia repaired then start radiation. He is requesting to see a different surgeon because he does not feel he and Dr. Johney Maine see eye to eye. Lars Mage states she will speak with Dr. Johney Maine and she will call the patient. I informed her that we are encouraging the patient to keep his appointment tomorrow and move forward with radiation. She will keep me posted.

## 2016-01-04 ENCOUNTER — Ambulatory Visit: Payer: Medicaid Other | Admitting: Radiation Oncology

## 2016-01-04 ENCOUNTER — Telehealth: Payer: Self-pay | Admitting: Medical Oncology

## 2016-01-04 NOTE — Telephone Encounter (Signed)
Received a voicemail from [name]. [name] to inform us that Dr. Johney Cook  is scheduling [name] for hernia repair. She has contacted the patient with this information. I will notify Dr. Tammi Klippel.

## 2016-01-10 ENCOUNTER — Telehealth: Payer: Self-pay | Admitting: Medical Oncology

## 2016-01-10 NOTE — Telephone Encounter (Signed)
Lisa-sister called to inform us that Dr. Johney Maine will do the hernia repair on patient 01/25/16. He will return 10/24 for post-op visit. I will inform Dr. Tammi Klippel of the dates.

## 2016-01-14 ENCOUNTER — Emergency Department (HOSPITAL_COMMUNITY): Payer: Medicaid Other

## 2016-01-14 ENCOUNTER — Encounter (HOSPITAL_COMMUNITY): Payer: Self-pay

## 2016-01-14 ENCOUNTER — Emergency Department (HOSPITAL_COMMUNITY)
Admission: EM | Admit: 2016-01-14 | Discharge: 2016-01-14 | Disposition: A | Discharge status: Home / Self Care | Payer: Medicaid Other | Attending: Emergency Medicine | Admitting: Emergency Medicine

## 2016-01-14 DIAGNOSIS — Z79899 Other long term (current) drug therapy: Secondary | ICD-10-CM | POA: Diagnosis not present

## 2016-01-14 DIAGNOSIS — Y999 Unspecified external cause status: Secondary | ICD-10-CM | POA: Diagnosis not present

## 2016-01-14 DIAGNOSIS — Z8547 Personal history of malignant neoplasm of testis: Secondary | ICD-10-CM | POA: Insufficient documentation

## 2016-01-14 DIAGNOSIS — Z8546 Personal history of malignant neoplasm of prostate: Secondary | ICD-10-CM | POA: Diagnosis not present

## 2016-01-14 DIAGNOSIS — Y92009 Unspecified place in unspecified non-institutional (private) residence as the place of occurrence of the external cause: Secondary | ICD-10-CM | POA: Diagnosis not present

## 2016-01-14 DIAGNOSIS — F17210 Nicotine dependence, cigarettes, uncomplicated: Secondary | ICD-10-CM | POA: Insufficient documentation

## 2016-01-14 DIAGNOSIS — Y939 Activity, unspecified: Secondary | ICD-10-CM | POA: Diagnosis not present

## 2016-01-14 DIAGNOSIS — E039 Hypothyroidism, unspecified: Secondary | ICD-10-CM | POA: Insufficient documentation

## 2016-01-14 DIAGNOSIS — S0990XA Unspecified injury of head, initial encounter: Secondary | ICD-10-CM | POA: Diagnosis not present

## 2016-01-14 MED ORDER — ONDANSETRON 4 MG PO TBDP: 4.0000 mg | ORAL_TABLET | Freq: Three times a day (TID) | ORAL | 0 refills | Status: DC | PRN

## 2016-01-14 MED ORDER — OXYCODONE-ACETAMINOPHEN 5-325 MG PO TABS: 1.0000 | ORAL_TABLET | Freq: Four times a day (QID) | ORAL | 0 refills | Status: DC | PRN

## 2016-01-14 MED ORDER — OXYCODONE-ACETAMINOPHEN 5-325 MG PO TABS
2.0000 | ORAL_TABLET | Freq: Once | ORAL | Status: AC
Start: 2016-01-14 — End: 2016-01-14
  Administered 2016-01-14: 2 via ORAL
  Filled 2016-01-14: qty 2

## 2016-01-14 MED ORDER — ONDANSETRON 4 MG PO TBDP
4.0000 mg | ORAL_TABLET | Freq: Once | ORAL | Status: AC
  Administered 2016-01-14: 4 mg via ORAL
  Filled 2016-01-14: qty 1

## 2016-01-14 MED ORDER — MORPHINE SULFATE (PF) 4 MG/ML IV SOLN
6.0000 mg | Freq: Once | INTRAVENOUS | Status: AC
  Administered 2016-01-14: 6 mg via INTRAMUSCULAR
  Filled 2016-01-14: qty 2

## 2016-01-14 NOTE — Discharge Instructions (Signed)
To find a primary care or specialty doctor please call [phone] or [phone] to access "[facility] Find a Doctor Service." ° °You may also go on the [facility] website at [email or web address] ° °There are also multiple [facility] and [facility] practices throughout the [place] that are frequently accepting new patients. You may find a clinic that is close to your home and contact them. ° °[facility] -  °[address] °[place] [address] °[phone] ° °[facility] in [place] (also locations in [place] and [place]) -  °[address] °[place] [address] °[phone] ° °[facility] -  °[address] ° [place] [address] °[phone] ° ° °
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
## 2016-01-16 NOTE — Patient Instructions (Addendum)
RAYDIN SIMARD  01/16/2016   Your procedure is scheduled on: 01/25/2016    Report to [facility]  Entrance take [facility]  elevators to 3rd floor to  [place] at     1200noon  Call this number if you have problems the morning of surgery [phone]   Remember: ONLY 1 PERSON MAY GO WITH YOU TO SHORT STAY TO GET  READY MORNING OF YOUR SURGERY.  Do not eat food after midnite.  May have clear liquids from 12 midnite until 0730am then nothing by mouth.       Take these medicines the morning of surgery with A SIP OF WATER: Synthroid, Oxycodone if needed                                 You may not have any metal on your body including hair pins and              piercings  Do not wear jewelry, , lotions, powders or perfumes, deodorant                       Men may shave face and neck.   Do not bring valuables to the hospital. [place].  Contacts, dentures or bridgework may not be worn into surgery.  Leave suitcase in the car. After surgery it may be brought to your room.        Special Instructions: N/A              Please read over the following fact sheets you were given: _____________________________________________________________________             [facility] - Preparing for Surgery Before surgery, you can play an important role.  Because skin is not sterile, your skin needs to be as free of germs as possible.  You can reduce the number of germs on your skin by washing with CHG (chlorahexidine gluconate) soap before surgery.  CHG is an antiseptic cleaner which kills germs and bonds with the skin to continue killing germs even after washing. Please DO NOT use if you have an allergy to CHG or antibacterial soaps.  If your skin becomes reddened/irritated stop using the CHG and inform your nurse when you arrive at Short Stay. Do not shave (including legs and underarms) for at least 48 hours prior to  the first CHG shower.  You may shave your face/neck. Please follow these instructions carefully:  1.  Shower with CHG Soap the night before surgery and the  morning of Surgery.  2.  If you choose to wash your hair, wash your hair first as usual with your  normal  shampoo.  3.  After you shampoo, rinse your hair and body thoroughly to remove the  shampoo.                           4.  Use CHG as you would any other liquid soap.  You can apply chg directly  to the skin and wash                       Gently with a scrungie  or clean washcloth.  5.  Apply the CHG Soap to your body ONLY FROM THE NECK DOWN.   Do not use on face/ open                           Wound or open sores. Avoid contact with eyes, ears mouth and genitals (private parts).                       Wash face,  Genitals (private parts) with your normal soap.             6.  Wash thoroughly, paying special attention to the area where your surgery  will be performed.  7.  Thoroughly rinse your body with warm water from the neck down.  8.  DO NOT shower/wash with your normal soap after using and rinsing off  the CHG Soap.                9.  Pat yourself dry with a clean towel.            10.  Wear clean pajamas.            11.  Place clean sheets on your bed the night of your first shower and do not  sleep with pets. Day of Surgery : Do not apply any lotions/deodorants the morning of surgery.  Please wear clean clothes to the hospital/surgery center.  FAILURE TO FOLLOW THESE INSTRUCTIONS MAY RESULT IN THE CANCELLATION OF YOUR SURGERY PATIENT SIGNATURE_________________________________  NURSE SIGNATURE__________________________________  ________________________________________________________________________    CLEAR LIQUID DIET   Foods Allowed                                                                     Foods Excluded  Coffee and tea, regular and decaf                             liquids that you cannot  Plain Jell-O in  any flavor                                             see through such as: Fruit ices (not with fruit pulp)                                     milk, soups, orange juice  Iced Popsicles                                    All solid food Carbonated beverages, regular and diet                                    Cranberry, grape and apple juices Sports drinks like Gatorade Lightly seasoned clear broth or consume(fat free) Sugar, honey syrup  Sample Menu Breakfast  Lunch                                     Supper Cranberry juice                    Beef broth                            Chicken broth Jell-O                                     Grape juice                           Apple juice Coffee or tea                        Jell-O                                      Popsicle                                                Coffee or tea                        Coffee or tea  _____________________________________________________________________

## 2016-01-18 ENCOUNTER — Encounter (HOSPITAL_COMMUNITY): Payer: Self-pay

## 2016-01-18 ENCOUNTER — Encounter (HOSPITAL_COMMUNITY)
Admission: RE | Admit: 2016-01-18 | Discharge: 2016-01-18 | Disposition: A | Discharge status: Home / Self Care | Payer: Medicaid Other | Source: Ambulatory Visit | Attending: Surgery | Admitting: Surgery

## 2016-01-18 ENCOUNTER — Emergency Department (HOSPITAL_COMMUNITY)
Admission: EM | Admit: 2016-01-18 | Discharge: 2016-01-18 | Discharge status: Against Medical Advice | Payer: Medicaid Other | Attending: Emergency Medicine | Admitting: Emergency Medicine

## 2016-01-18 DIAGNOSIS — S1093XA Contusion of unspecified part of neck, initial encounter: Secondary | ICD-10-CM | POA: Diagnosis not present

## 2016-01-18 DIAGNOSIS — E039 Hypothyroidism, unspecified: Secondary | ICD-10-CM | POA: Insufficient documentation

## 2016-01-18 DIAGNOSIS — K469 Unspecified abdominal hernia without obstruction or gangrene: Secondary | ICD-10-CM | POA: Diagnosis not present

## 2016-01-18 DIAGNOSIS — Z8546 Personal history of malignant neoplasm of prostate: Secondary | ICD-10-CM | POA: Diagnosis not present

## 2016-01-18 DIAGNOSIS — S00432A Contusion of left ear, initial encounter: Secondary | ICD-10-CM | POA: Diagnosis not present

## 2016-01-18 DIAGNOSIS — Z01812 Encounter for preprocedural laboratory examination: Secondary | ICD-10-CM | POA: Diagnosis not present

## 2016-01-18 DIAGNOSIS — R519 Headache, unspecified: Secondary | ICD-10-CM

## 2016-01-18 DIAGNOSIS — F17210 Nicotine dependence, cigarettes, uncomplicated: Secondary | ICD-10-CM | POA: Insufficient documentation

## 2016-01-18 DIAGNOSIS — Y999 Unspecified external cause status: Secondary | ICD-10-CM | POA: Insufficient documentation

## 2016-01-18 DIAGNOSIS — S0003XA Contusion of scalp, initial encounter: Secondary | ICD-10-CM | POA: Insufficient documentation

## 2016-01-18 DIAGNOSIS — Y929 Unspecified place or not applicable: Secondary | ICD-10-CM | POA: Insufficient documentation

## 2016-01-18 DIAGNOSIS — Y939 Activity, unspecified: Secondary | ICD-10-CM | POA: Diagnosis not present

## 2016-01-18 DIAGNOSIS — R51 Headache: Secondary | ICD-10-CM

## 2016-01-18 DIAGNOSIS — Z8547 Personal history of malignant neoplasm of testis: Secondary | ICD-10-CM | POA: Diagnosis not present

## 2016-01-18 DIAGNOSIS — S199XXA Unspecified injury of neck, initial encounter: Secondary | ICD-10-CM | POA: Diagnosis present

## 2016-01-18 HISTORY — DX: Unspecified open wound, left lower leg, initial encounter: S81.802A

## 2016-01-18 LAB — COMPREHENSIVE METABOLIC PANEL
ALK PHOS: 64 U/L (ref 38–126)
ALT: 13 U/L — AB (ref 17–63)
AST: 22 U/L (ref 15–41)
Albumin: 4.1 g/dL (ref 3.5–5.0)
Anion gap: 8 (ref 5–15)
BILIRUBIN TOTAL: 0.4 mg/dL (ref 0.3–1.2)
BUN: 13 mg/dL (ref 6–20)
CALCIUM: 9.4 mg/dL (ref 8.9–10.3)
CHLORIDE: 104 mmol/L (ref 101–111)
CO2: 26 mmol/L (ref 22–32)
CREATININE: 1.12 mg/dL (ref 0.61–1.24)
GFR calc Af Amer: >60 mL/min (ref >60)
Glucose, Bld: 107 mg/dL — ABNORMAL HIGH (ref 65–99)
Potassium: 4.7 mmol/L (ref 3.5–5.1)
Sodium: 138 mmol/L (ref 135–145)
Total Protein: 7.5 g/dL (ref 6.5–8.1)

## 2016-01-18 LAB — CBC
HEMATOCRIT: 37.2 % — AB (ref 39.0–52.0)
HEMOGLOBIN: 12.7 g/dL — AB (ref 13.0–17.0)
MCH: 31.9 pg (ref 26.0–34.0)
MCHC: 34.1 g/dL (ref 30.0–36.0)
MCV: 93.5 fL (ref 78.0–100.0)
Platelets: 161 10*3/uL (ref 150–400)
RBC: 3.98 MIL/uL — AB (ref 4.22–5.81)
RDW: 16.1 % — ABNORMAL HIGH (ref 11.5–15.5)
WBC: 6.3 10*3/uL (ref 4.0–10.5)
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
## 2016-01-18 NOTE — Progress Notes (Signed)
Patient to see Dr Watt Climes today for clearance for surgery.

## 2016-01-18 NOTE — ED Notes (Signed)
Pt leaving AMA, Provider aware.

## 2016-01-18 NOTE — Progress Notes (Signed)
EKG-07/24/15- EPIC  2V CXR- 03/2015- EPIC

## 2016-01-18 NOTE — ED Notes (Signed)
Pt reports that he has to be at an appointment with a gastroenterologist by 11:30am today and is stating he needs to leave, provider notified and at bedside.
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
## 2016-01-25 ENCOUNTER — Ambulatory Visit (HOSPITAL_COMMUNITY): Admission: RE | Admit: 2016-01-25 | Payer: Medicaid Other | Source: Ambulatory Visit | Admitting: Surgery

## 2016-01-25 ENCOUNTER — Encounter (HOSPITAL_COMMUNITY): Admission: RE | Payer: Self-pay | Source: Ambulatory Visit

## 2016-01-25 ENCOUNTER — Telehealth: Payer: Self-pay | Admitting: Medical Oncology

## 2016-01-25 SURGERY — REPAIR, HERNIA, VENTRAL, LAPAROSCOPIC
Anesthesia: General
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
## 2016-02-07 ENCOUNTER — Ambulatory Visit: Payer: Medicaid Other | Admitting: Radiation Oncology

## 2016-02-14 ENCOUNTER — Ambulatory Visit: Payer: Medicaid Other | Admitting: Radiation Oncology

## 2016-02-15 ENCOUNTER — Encounter: Payer: Self-pay | Admitting: Medical Oncology

## 2016-02-15 ENCOUNTER — Telehealth: Payer: Self-pay | Admitting: Medical Oncology

## 2016-02-15 NOTE — Progress Notes (Signed)
Notified Dr. Tammi Klippel of new hernia repair date. CT simulation will notify patient of new date and time.

## 2016-02-15 NOTE — Telephone Encounter (Signed)
Lisa-sister called to inform me Mr.Colello's hernia surgery has been rescheduled to 02/29/16. I will notify Dr. Tammi Klippel and his office will call with new CT simulation date. She voiced understanding.

## 2016-02-26 ENCOUNTER — Encounter (HOSPITAL_COMMUNITY): Payer: Self-pay

## 2016-02-26 ENCOUNTER — Encounter (HOSPITAL_COMMUNITY)
Admission: RE | Admit: 2016-02-26 | Discharge: 2016-02-26 | Disposition: A | Discharge status: Home / Self Care | Payer: Medicaid Other | Source: Ambulatory Visit | Attending: Surgery | Admitting: Surgery

## 2016-02-26 ENCOUNTER — Ambulatory Visit: Payer: Self-pay | Admitting: Surgery

## 2016-02-26 DIAGNOSIS — Z01818 Encounter for other preprocedural examination: Secondary | ICD-10-CM | POA: Insufficient documentation

## 2016-02-26 DIAGNOSIS — K439 Ventral hernia without obstruction or gangrene: Secondary | ICD-10-CM | POA: Insufficient documentation

## 2016-02-26 HISTORY — DX: Frequency of micturition: R35.0

## 2016-02-26 LAB — CBC
HCT: 40.4 % (ref 39.0–52.0)
HEMOGLOBIN: 13.3 g/dL (ref 13.0–17.0)
MCH: 31.5 pg (ref 26.0–34.0)
MCHC: 32.9 g/dL (ref 30.0–36.0)
MCV: 95.7 fL (ref 78.0–100.0)
Platelets: 156 10*3/uL (ref 150–400)
RBC: 4.22 MIL/uL (ref 4.22–5.81)
RDW: 14.3 % (ref 11.5–15.5)
WBC: 6.7 10*3/uL (ref 4.0–10.5)

## 2016-02-26 LAB — COMPREHENSIVE METABOLIC PANEL
ALK PHOS: 66 U/L (ref 38–126)
ALT: 11 U/L — ABNORMAL LOW (ref 17–63)
ANION GAP: 8 (ref 5–15)
AST: 18 U/L (ref 15–41)
Albumin: 3.6 g/dL (ref 3.5–5.0)
BILIRUBIN TOTAL: 0.8 mg/dL (ref 0.3–1.2)
BUN: 11 mg/dL (ref 6–20)
CALCIUM: 9.1 mg/dL (ref 8.9–10.3)
CO2: 23 mmol/L (ref 22–32)
Chloride: 107 mmol/L (ref 101–111)
Creatinine, Ser: 1.2 mg/dL (ref 0.61–1.24)
GFR calc non Af Amer: >60 mL/min (ref >60)
Glucose, Bld: 77 mg/dL (ref 65–99)
Potassium: 3.9 mmol/L (ref 3.5–5.1)
SODIUM: 138 mmol/L (ref 135–145)
TOTAL PROTEIN: 6.9 g/dL (ref 6.5–8.1)

## 2016-02-26 NOTE — Pre-Procedure Instructions (Signed)
[name]  02/26/2016      [facility] - [place] - [address] [address] [place] [address] Phone: [phone] Fax: [phone]    Your procedure is scheduled on November 3rd, 2017.  Report to [facility] Admitting at 12:00 P.M.   Call this number if you have problems the morning of surgery:  [phone]   Remember:  Do not eat food or drink liquids after midnight.   Take these medicines the morning of surgery with A SIP OF WATER: Gabapentin (Neurontin), Levothyroxine (Synthroid).  Stop taking: Aspirin, NSAIDS, Aleve, Naproxen, Ibuprofen, Advil, Motrin, BC's, Goody's, Fish oil, all herbal medications, and all vitamins.     Do not wear jewelry.  Do not wear lotions, powders, or colognes, or deoderant.  Men may shave face and neck.  Do not bring valuables to the hospital.  [facility] is not responsible for any belongings or valuables.  Contacts, dentures or bridgework may not be worn into surgery.  Leave your suitcase in the car.  After surgery it may be brought to your room.  For patients admitted to the hospital, discharge time will be determined by your treatment team.  Patients discharged the day of surgery will not be allowed to drive home.   Special instructions:  Preparing for surgery.   [facility]- Preparing For Surgery  Before surgery, you can play an important role. Because skin is not sterile, your skin needs to be as free of germs as possible. You can reduce the number of germs on your skin by washing with CHG (chlorahexidine gluconate) Soap before surgery.  CHG is an antiseptic cleaner which kills germs and bonds with the skin to continue killing germs even after washing.  Please do not use if you have an allergy to CHG or antibacterial soaps. If your skin becomes reddened/irritated stop using the CHG.  Do not shave (including legs and underarms) for at least 48 hours prior to  first CHG shower. It is OK to shave your face.  Please follow these instructions carefully.   1. Shower the NIGHT BEFORE SURGERY and the MORNING OF SURGERY with CHG.   2. If you chose to wash your hair, wash your hair first as usual with your normal shampoo.  3. After you shampoo, rinse your hair and body thoroughly to remove the shampoo.  4. Use CHG as you would any other liquid soap. You can apply CHG directly to the skin and wash gently with a scrungie or a clean washcloth.   5. Apply the CHG Soap to your body ONLY FROM THE NECK DOWN.  Do not use on open wounds or open sores. Avoid contact with your eyes, ears, mouth and genitals (private parts). Wash genitals (private parts) with your normal soap.  6. Wash thoroughly, paying special attention to the area where your surgery will be performed.  7. Thoroughly rinse your body with warm water from the neck down.  8. DO NOT shower/wash with your normal soap after using and rinsing off the CHG Soap.  9. Pat yourself dry with a CLEAN TOWEL.   10. Wear CLEAN PAJAMAS   11. Place CLEAN SHEETS on your bed the night of your first shower and DO NOT SLEEP WITH PETS.  Day of Surgery: Do not apply any deodorants/lotions. Please wear clean clothes to the hospital/surgery center.    Please read over the following fact sheets that you were given.

## 2016-02-26 NOTE — Progress Notes (Signed)
PCP - [facility] Cardiologist - denies Urologist - Dr. Tammi Klippel Gastroenterologist - Dr. Watt Climes  EKG - 07/24/15 CXR - 04/11/15 Echo - 03/2010 Stress test - denies Cardiac Cath - denies  Patient denies chest pain and shortness of breath at PAT appointment.  Patient states that he is smoking approximately 1/2 pack of cigarettes a day and that he was smoking 2 packs per day.  Patient encouraged not to smoke 24 hours prior to surgery.   Patient given Boost Breeze to drink 2 hours prior to arrive (ERAS pathway).  Patient verbalized understanding.

## 2016-02-26 NOTE — H&P (Signed)
[name]  Location: [facility] Patient #: [id number] DOB: Aug 09, 1960 Single / Language: [name] / Race: White Male   History of Present Illness  The patient is a 55 year old male who presents with an incisional hernia. Note for "Incisional hernia": Patient returns with concerns of recurrent incisional hernia.  Obese smoking cirrhotic male. Underwent robotic prostatectomy in April for cancer. The left wound breakdown and dehiscence. Underwent urgent re-repair. Developed wound breakdown with open wound. Eventually was closing down. Surgical consultation requested. I discussed with him in the hospital about quitting smoking and trying to control cirrhosis to minimize chance of recurrence.  He comes today with his sister. He denies much pain. He's cut down on his smoking from 2 packs a day to less than a half pack a day. Trying these patches. Ling. There was some concern on CT scan of some chronic fluid collections near the right iliac lymphadenectomy site. Concerned that this may be explaining why he has some right lower extremity swelling. His sister talks about he is getting some drainage procedure tomorrow at the hospital. I guess this is interventional radiology trying to aspirate or drain and chronic lymphocytic fluid collection?.  He's hoping to have surgery done to fix the hernia as it is getting larger and somewhat bothersome.   Other Problems Marjean Donna, CMA; 11/26/2015 9:24 AM) Seizure Disorder Thyroid Disease Umbilical Hernia Repair  Past Surgical History Marjean Donna, CMA; 11/26/2015 9:24 AM) Foot Surgery Left. Spinal Surgery - Lower Back Spinal Surgery - Neck TURP  Diagnostic Studies History Marjean Donna, CMA; 11/26/2015 9:24 AM) Colonoscopy 1-5 years ago  Allergies Davy Pique Bynum, CMA; 11/26/2015 9:25 AM) Hydrocodone-Acetaminophen *ANALGESICS - OPIOID* Ibuprofen *ANALGESICS - ANTI-INFLAMMATORY*  Medication History (Sonya Bynum,  CMA; 11/26/2015 9:26 AM) Levothyroxine Sodium (112MCG Tablet, Oral) Active. Furosemide (40MG  Tablet, Oral) Active. Spironolactone (50MG  Tablet, Oral) Active. Famotidine (20MG  Tablet, Oral) Active. Sildenafil Citrate (20MG  Tablet, Oral) Active. Citalopram Hydrobromide (20MG  Tablet, Oral) Active. LORazepam (1MG  Tablet, Oral) Active. Omeprazole (40MG  Capsule DR, Oral) Active. Medications Reconciled  Social History Marjean Donna, CMA; 11/26/2015 9:24 AM) Alcohol use Moderate alcohol use. No caffeine use Tobacco use Current every day smoker.  Family History Marjean Donna, [place]; 11/26/2015 9:24 AM) Alcohol Abuse Father. Diabetes Mellitus Father. Heart Disease Father.    Review of Systems ([place]; 11/26/2015 9:24 AM) Cardiovascular Present- Leg Cramps. Not Present- Chest Pain, Difficulty Breathing Lying Down, Palpitations, Rapid Heart Rate, Shortness of Breath and Swelling of Extremities. Gastrointestinal Present- Abdominal Pain. Not Present- Bloating, Bloody Stool, Change in Bowel Habits, Chronic diarrhea, Constipation, Difficulty Swallowing, Excessive gas, Gets full quickly at meals, Hemorrhoids, Indigestion, Nausea, Rectal Pain and Vomiting. Musculoskeletal Present- Swelling of Extremities. Not Present- Back Pain, Joint Pain, Joint Stiffness, Muscle Pain and Muscle Weakness. Psychiatric Present- Anxiety. Not Present- Bipolar, Change in Sleep Pattern, Depression, Fearful and Frequent crying. Endocrine Not Present- Cold Intolerance, Excessive Hunger, Hair Changes, Heat Intolerance, Hot flashes and New Diabetes. Hematology Present- Easy Bruising. Not Present- Blood Thinners, Excessive bleeding, Gland problems, HIV and Persistent Infections.  Vitals (Sonya Bynum CMA; 11/26/2015 9:24 AM) 11/26/2015 9:24 AM Weight: 216 lb Height: 71in Body Surface Area: 2.18 m Body Mass Index: 30.13 kg/m  Temp.: 61F(Temporal)  Pulse: 79 (Regular)  BP: 126/82 (Sitting, Left  Arm, Standard)   There were no vitals taken for this visit.    Physical Exam [name] MD; 11/26/2015 10:01 AM) General Mental Status-Alert. General Appearance-Not in acute distress, Not Sickly. Orientation-Oriented X3. Hydration-Well hydrated. Voice-Normal.  Note: Calm. Relaxed. In no acute distress. Mild smell of tobacco.   Integumentary Global Assessment Upon inspection and palpation of skin surfaces of the - Axillae: non-tender, no inflammation or ulceration, no drainage. and Distribution of scalp and body hair is normal. General Characteristics Temperature - normal warmth is noted.  Head and Neck Head-normocephalic, atraumatic with no lesions or palpable masses. Face Global Assessment - atraumatic, no absence of expression. Neck Global Assessment - no abnormal movements, no bruit auscultated on the right, no bruit auscultated on the left, no decreased range of motion, non-tender. Trachea-midline. Thyroid Gland Characteristics - non-tender.  Eye Eyeball - Left-Extraocular movements intact, No Nystagmus. Eyeball - Right-Extraocular movements intact, No Nystagmus. Cornea - Left-No Hazy. Cornea - Right-No Hazy. Sclera/Conjunctiva - Left-No scleral icterus, No Discharge. Sclera/Conjunctiva - Right-No scleral icterus, No Discharge. Pupil - Left-Direct reaction to light normal. Pupil - Right-Direct reaction to light normal.  ENMT Ears Pinna - Left - no drainage observed, no generalized tenderness observed. Right - no drainage observed, no generalized tenderness observed. Nose and Sinuses External Inspection of the Nose - no destructive lesion observed. Inspection of the nares - Left - quiet respiration. Right - quiet respiration. Mouth and Throat Lips - Upper Lip - no fissures observed, no pallor noted. Lower Lip - no fissures observed, no pallor noted. Nasopharynx - no discharge present. Oral Cavity/Oropharynx - Tongue - no dryness  observed. Oral Mucosa - no cyanosis observed. Hypopharynx - no evidence of airway distress observed.  Chest and Lung Exam Inspection Movements - Normal and Symmetrical. Accessory muscles - No use of accessory muscles in breathing. Palpation Palpation of the chest reveals - Non-tender. Auscultation Breath sounds - Normal and Clear.  Cardiovascular Auscultation Rhythm - Regular. Murmurs & Other Heart Sounds - Auscultation of the heart reveals - No Murmurs and No Systolic Clicks.  Abdomen Inspection Inspection of the abdomen reveals - No Visible peristalsis and No Abnormal pulsations. Umbilicus - No Bleeding, No Urine drainage. Palpation/Percussion Palpation and Percussion of the abdomen reveal - Soft, Non Tender, No Rebound tenderness, No Rigidity (guarding) and No Cutaneous hyperesthesia. Note: Daily soft and obese. Moderate diastases recti. Supraumbilical hernia within diastases. Reducible.   Male Genitourinary Sexual Maturity Tanner 5 - Adult hair pattern and Adult penile size and shape.  Peripheral Vascular Upper Extremity Inspection - Left - No Cyanotic nailbeds, Not Ischemic. Right - No Cyanotic nailbeds, Not Ischemic.  Neurologic Neurologic evaluation reveals -normal attention span and ability to concentrate, able to name objects and repeat phrases. Appropriate fund of knowledge , normal sensation and normal coordination. Mental Status Affect - not angry, not paranoid. Cranial Nerves-Normal Bilaterally. Gait-Normal.  Neuropsychiatric Mental status exam performed with findings of-able to articulate well with normal speech/language, rate, volume and coherence, thought content normal with ability to perform basic computations and apply abstract reasoning and no evidence of hallucinations, delusions, obsessions or homicidal/suicidal ideation.  Musculoskeletal Global Assessment Spine, Ribs and Pelvis - no instability, subluxation or laxity. Right Upper Extremity -  no instability, subluxation or laxity. Note: Right greater than left lower extremity edema   Lymphatic Head & Neck  General Head & Neck Lymphatics: Bilateral - Description - No Localized lymphadenopathy. Axillary  General Axillary Region: Bilateral - Description - No Localized lymphadenopathy. Femoral & Inguinal  Generalized Femoral & Inguinal Lymphatics: Left - Description - No Localized lymphadenopathy. Right - Description - No Localized lymphadenopathy.    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  Cirrhosis appears to be stable. Tolerated the 2 prior operations and was seen by gastroenterology last fall without any major issues. No change in LFTs. Okay to proceed with surgery from their standpoint  There is concern of a chronic right pelvic fluid collection that seems to be decreasing venous return at giving him chronic right lower extremity swelling. The patient and his sister note he is due for some drainage procedure tomorrow. I do not know that paracentesis or other plan to place a drain to deal with a possible chronic lymph leak/seroma. Once that has resolved and there are no more drains, would like urology to make sure he is okay to proceed with surgery.  Once he quit smoking, plan surgical repair. We do underlay repair with mesh since he has failed 2 primary repairs. I don't get a strong sense that he has significant ascites, nor any infection. I again stressed to him the importance of quitting smoking to minimize recurrence and other complications.  PREOP - [place] - ENCOUNTER FOR PREOPERATIVE EXAMINATION FOR GENERAL SURGICAL PROCEDURE (Z01.818) Current Plans You are being scheduled for surgery - Our schedulers will call you.  You should hear from our office's scheduling department within 5 working days about the  location, date, and time of surgery. We try to make accommodations for patient's preferences in scheduling surgery, but sometimes the OR schedule or the surgeon's schedule prevents [name] from making those accommodations.  If you have not heard from our office [phone]) in 5 working days, call the office and ask for your surgeon's nurse.  If you have other questions about your diagnosis, plan, or surgery, call the office and ask for your surgeon's nurse.  Written instructions provided The anatomy & physiology of the abdominal wall was discussed. The pathophysiology of hernias was discussed. Natural history risks without surgery including progeressive enlargement, pain, incarceration, & strangulation was discussed. Contributors to complications such as smoking, obesity, diabetes, prior surgery, etc were discussed.  I feel the risks of no intervention will lead to serious problems that outweigh the operative risks; therefore, I recommended surgery to reduce and repair the hernia. I explained laparoscopic techniques with possible need for an open approach. I noted the probable use of mesh to patch and/or buttress the hernia repair  Risks such as bleeding, infection, abscess, need for further treatment, heart attack, death, and other risks were discussed. I noted a good likelihood this will help address the problem. Goals of post-operative recovery were discussed as well. Possibility that this will not correct all symptoms was explained. I stressed the importance of low-impact activity, aggressive pain control, avoiding constipation, & not pushing through pain to minimize risk of post-operative chronic pain or injury. Possibility of reherniation especially with smoking, obesity, diabetes, immunosuppression, and other health conditions was discussed. We will work to minimize complications.  An educational handout further explaining the pathology & treatment options was given as well. Questions were  answered. The patient expresses understanding & wishes to proceed with surgery.  Pt Education - [facility] Hernia Post-Op HCI (Cathryn Gallery): discussed with patient and provided information. Pt Education - [facility] Pain Control (Marlys Stegmaier) Pt Education - Pamphlet Given - Laparoscopic Hernia Repair: discussed with patient and provided information. TOBACCO ABUSE (Z72.0) Current Plans Pt Education - [facility] STOP SMOKING! DIASTASIS RECTI (M62.08) Current Plans Pt Education - [facility] Diastasis Recti: discussed with patient and provided information.  I have re-reviewed the the patient's records, history, medications,  and allergies.  Patient's mental status cleared.  Cleared neurologically from traumatic event. The patient agrees to proceed.   [name], M.D., F.A.C.S. Gastrointestinal and Minimally Invasive Surgery [facility] [address], [place], [place] [address] [phone] Main / Paging

## 2016-02-27 HISTORY — PX: UMBILICAL HERNIA REPAIR: SHX196

## 2016-02-27 NOTE — Progress Notes (Signed)
Anesthesia Chart Review:  Pt is a 55 year old male scheduled for laparoscopic ventral wall hernia repair, insertion of mesh on 02/29/2016 with Michael Boston, MD.   PMH includes:  Liver cirrhosis, hypothyroidism, alcoholism, testicular cancer, prostate cancer. Current smoker. BMI 31. S/p exploratory laparotomy 08/07/15. S/p radical prostatectomy 07/30/15.   Medications include: lasix, levothyroxine, potassium, spironolactone  Preoperative labs reviewed.    CXR 04/11/15:  1. Borderline cardiomegaly. No evidence of overt congestive heart failure. 2. No focal pulmonary abnormality.  EKG 07/24/15: Sinus bradycardia (52 bpm). Left axis deviation. Anterior infarct, age undetermined  If no changes, I anticipate pt can proceed with surgery as scheduled.   Willeen Cass, FNP-BC [facility]/Anesthesiology Phone: [phone] 02/27/2016 3:04 PM

## 2016-02-29 ENCOUNTER — Encounter (HOSPITAL_COMMUNITY)
Admission: RE | Disposition: A | Discharge status: Home / Self Care | Payer: Self-pay | Source: Ambulatory Visit | Attending: Surgery

## 2016-02-29 ENCOUNTER — Inpatient Hospital Stay (HOSPITAL_COMMUNITY)
Admission: RE | Admit: 2016-02-29 | Discharge: 2016-03-03 | DRG: 355 | Disposition: A | Discharge status: Home / Self Care | Payer: Medicaid Other | Source: Ambulatory Visit | Attending: Surgery | Admitting: Surgery

## 2016-02-29 ENCOUNTER — Ambulatory Visit (HOSPITAL_COMMUNITY): Payer: Medicaid Other | Admitting: Emergency Medicine

## 2016-02-29 ENCOUNTER — Ambulatory Visit (HOSPITAL_COMMUNITY): Payer: Medicaid Other | Admitting: Certified Registered"

## 2016-02-29 ENCOUNTER — Encounter (HOSPITAL_COMMUNITY): Payer: Self-pay | Admitting: Urology

## 2016-02-29 DIAGNOSIS — Z811 Family history of alcohol abuse and dependence: Secondary | ICD-10-CM

## 2016-02-29 DIAGNOSIS — M6208 Separation of muscle (nontraumatic), other site: Secondary | ICD-10-CM | POA: Diagnosis present

## 2016-02-29 DIAGNOSIS — F17210 Nicotine dependence, cigarettes, uncomplicated: Secondary | ICD-10-CM | POA: Diagnosis present

## 2016-02-29 DIAGNOSIS — Z720 Tobacco use: Secondary | ICD-10-CM | POA: Diagnosis present

## 2016-02-29 DIAGNOSIS — Z6831 Body mass index (BMI) 31.0-31.9, adult: Secondary | ICD-10-CM

## 2016-02-29 DIAGNOSIS — K432 Incisional hernia without obstruction or gangrene: Principal | ICD-10-CM | POA: Diagnosis present

## 2016-02-29 DIAGNOSIS — F418 Other specified anxiety disorders: Secondary | ICD-10-CM | POA: Diagnosis present

## 2016-02-29 DIAGNOSIS — F419 Anxiety disorder, unspecified: Secondary | ICD-10-CM | POA: Diagnosis present

## 2016-02-29 DIAGNOSIS — Z833 Family history of diabetes mellitus: Secondary | ICD-10-CM

## 2016-02-29 DIAGNOSIS — Z8249 Family history of ischemic heart disease and other diseases of the circulatory system: Secondary | ICD-10-CM

## 2016-02-29 DIAGNOSIS — Z923 Personal history of irradiation: Secondary | ICD-10-CM

## 2016-02-29 DIAGNOSIS — E669 Obesity, unspecified: Secondary | ICD-10-CM | POA: Diagnosis present

## 2016-02-29 DIAGNOSIS — E876 Hypokalemia: Secondary | ICD-10-CM | POA: Diagnosis present

## 2016-02-29 DIAGNOSIS — E039 Hypothyroidism, unspecified: Secondary | ICD-10-CM | POA: Diagnosis present

## 2016-02-29 DIAGNOSIS — Z806 Family history of leukemia: Secondary | ICD-10-CM

## 2016-02-29 DIAGNOSIS — K7460 Unspecified cirrhosis of liver: Secondary | ICD-10-CM | POA: Diagnosis present

## 2016-02-29 DIAGNOSIS — Z8546 Personal history of malignant neoplasm of prostate: Secondary | ICD-10-CM

## 2016-02-29 DIAGNOSIS — G40909 Epilepsy, unspecified, not intractable, without status epilepticus: Secondary | ICD-10-CM | POA: Diagnosis present

## 2016-02-29 HISTORY — PX: LAPAROSCOPIC ASSISTED VENTRAL HERNIA REPAIR: SHX6312

## 2016-02-29 HISTORY — PX: INSERTION OF MESH: SHX5868

## 2016-02-29 HISTORY — DX: Other specified noninfective disorders of lymphatic vessels and lymph nodes: I89.8

## 2016-02-29 HISTORY — PX: VENTRAL HERNIA REPAIR: SHX424

## 2016-02-29 SURGERY — REPAIR, HERNIA, VENTRAL, LAPAROSCOPIC
Anesthesia: General | Site: Abdomen

## 2016-02-29 MED ORDER — OXYCODONE HCL 5 MG PO TABS: 5.0000 mg | ORAL_TABLET | ORAL | 0 refills | Status: DC | PRN

## 2016-02-29 MED ORDER — HYDROMORPHONE HCL 2 MG/ML IJ SOLN: 0.5000 mg | INTRAMUSCULAR | Status: DC | PRN

## 2016-02-29 MED ORDER — PROPOFOL 10 MG/ML IV BOLUS
INTRAVENOUS | Status: AC
  Filled 2016-02-29: qty 20

## 2016-02-29 MED ORDER — LEVOTHYROXINE SODIUM 112 MCG PO TABS
112.0000 ug | ORAL_TABLET | Freq: Every day | ORAL | Status: DC
  Administered 2016-03-01 – 2016-03-03 (×3): 112 ug via ORAL
  Filled 2016-02-29 (×3): qty 1

## 2016-02-29 MED ORDER — SPIRONOLACTONE 50 MG PO TABS
50.0000 mg | ORAL_TABLET | Freq: Two times a day (BID) | ORAL | Status: DC
  Administered 2016-02-29 – 2016-03-03 (×6): 50 mg via ORAL
  Filled 2016-02-29 (×6): qty 1

## 2016-02-29 MED ORDER — ONDANSETRON HCL 4 MG/2ML IJ SOLN
4.0000 mg | Freq: Four times a day (QID) | INTRAMUSCULAR | Status: DC | PRN
Start: 2016-02-29 — End: 2016-03-03

## 2016-02-29 MED ORDER — CHLORHEXIDINE GLUCONATE CLOTH 2 % EX PADS: 6.0000 | MEDICATED_PAD | Freq: Once | CUTANEOUS | Status: DC

## 2016-02-29 MED ORDER — MAGIC MOUTHWASH: 15.0000 mL | Freq: Four times a day (QID) | ORAL | Status: DC | PRN

## 2016-02-29 MED ORDER — METOPROLOL TARTRATE 5 MG/5ML IV SOLN: 5.0000 mg | Freq: Four times a day (QID) | INTRAVENOUS | Status: DC | PRN

## 2016-02-29 MED ORDER — HYDROCORTISONE 1 % EX CREA
1.0000 "application " | TOPICAL_CREAM | Freq: Four times a day (QID) | CUTANEOUS | Status: DC | PRN
  Filled 2016-02-29: qty 28

## 2016-02-29 MED ORDER — DIPHENHYDRAMINE HCL 12.5 MG/5ML PO ELIX: 12.5000 mg | ORAL_SOLUTION | Freq: Four times a day (QID) | ORAL | Status: DC | PRN

## 2016-02-29 MED ORDER — ONDANSETRON HCL 4 MG/2ML IJ SOLN
INTRAMUSCULAR | Status: AC
  Filled 2016-02-29: qty 2

## 2016-02-29 MED ORDER — SODIUM CHLORIDE 0.9 % IV SOLN
INTRAVENOUS | Status: DC | PRN
  Administered 2016-02-29: 80 mL

## 2016-02-29 MED ORDER — PHENYLEPHRINE HCL 10 MG/ML IJ SOLN
INTRAMUSCULAR | Status: DC | PRN
  Administered 2016-02-29 (×2): 120 ug via INTRAVENOUS
  Administered 2016-02-29: 80 ug via INTRAVENOUS

## 2016-02-29 MED ORDER — ONDANSETRON HCL 4 MG/2ML IJ SOLN
INTRAMUSCULAR | Status: DC | PRN
  Administered 2016-02-29: 4 mg via INTRAVENOUS

## 2016-02-29 MED ORDER — ENOXAPARIN SODIUM 40 MG/0.4ML ~~LOC~~ SOLN
40.0000 mg | SUBCUTANEOUS | Status: DC
  Administered 2016-03-01 – 2016-03-03 (×3): 40 mg via SUBCUTANEOUS
  Filled 2016-02-29 (×3): qty 0.4

## 2016-02-29 MED ORDER — LIDOCAINE 2% (20 MG/ML) 5 ML SYRINGE
INTRAMUSCULAR | Status: AC
  Filled 2016-02-29: qty 5

## 2016-02-29 MED ORDER — SODIUM CHLORIDE 0.9 % IV SOLN
8.0000 mg | Freq: Four times a day (QID) | INTRAVENOUS | Status: DC | PRN
  Filled 2016-02-29: qty 4

## 2016-02-29 MED ORDER — LACTATED RINGERS IV SOLN
INTRAVENOUS | Status: DC
  Administered 2016-02-29 – 2016-03-02 (×2): via INTRAVENOUS

## 2016-02-29 MED ORDER — ONDANSETRON HCL 4 MG/2ML IJ SOLN: 4.0000 mg | Freq: Four times a day (QID) | INTRAMUSCULAR | Status: DC | PRN

## 2016-02-29 MED ORDER — HYDROMORPHONE HCL 2 MG/ML IJ SOLN
0.5000 mg | INTRAMUSCULAR | Status: DC | PRN
  Administered 2016-02-29: 2 mg via INTRAVENOUS
  Administered 2016-03-01: 1 mg via INTRAVENOUS
  Administered 2016-03-01 – 2016-03-03 (×11): 2 mg via INTRAVENOUS
  Filled 2016-02-29 (×13): qty 1

## 2016-02-29 MED ORDER — BUPIVACAINE HCL (PF) 0.25 % IJ SOLN
INTRAMUSCULAR | Status: DC | PRN
  Administered 2016-02-29: 30 mL

## 2016-02-29 MED ORDER — METHOCARBAMOL 1000 MG/10ML IJ SOLN
1000.0000 mg | Freq: Four times a day (QID) | INTRAMUSCULAR | Status: DC | PRN
  Filled 2016-02-29: qty 10

## 2016-02-29 MED ORDER — FENTANYL CITRATE (PF) 100 MCG/2ML IJ SOLN: 25.0000 ug | INTRAMUSCULAR | Status: DC | PRN

## 2016-02-29 MED ORDER — POTASSIUM CHLORIDE CRYS ER 10 MEQ PO TBCR
10.0000 meq | EXTENDED_RELEASE_TABLET | Freq: Every day | ORAL | Status: DC
  Administered 2016-02-29 – 2016-03-03 (×4): 10 meq via ORAL
  Filled 2016-02-29 (×6): qty 1

## 2016-02-29 MED ORDER — LACTATED RINGERS IV BOLUS (SEPSIS): 1000.0000 mL | Freq: Three times a day (TID) | INTRAVENOUS | Status: AC | PRN

## 2016-02-29 MED ORDER — METHOCARBAMOL 500 MG PO TABS
500.0000 mg | ORAL_TABLET | Freq: Four times a day (QID) | ORAL | Status: DC | PRN
  Administered 2016-02-29: 500 mg via ORAL
  Filled 2016-02-29: qty 1

## 2016-02-29 MED ORDER — METHOCARBAMOL 750 MG PO TABS: 750.0000 mg | ORAL_TABLET | Freq: Four times a day (QID) | ORAL | 2 refills | Status: DC | PRN

## 2016-02-29 MED ORDER — ALUM & MAG HYDROXIDE-SIMETH 200-200-20 MG/5ML PO SUSP: 30.0000 mL | Freq: Four times a day (QID) | ORAL | Status: DC | PRN

## 2016-02-29 MED ORDER — NAPROXEN 250 MG PO TABS
500.0000 mg | ORAL_TABLET | Freq: Two times a day (BID) | ORAL | Status: DC | PRN
  Administered 2016-03-03: 500 mg via ORAL
  Filled 2016-02-29: qty 2

## 2016-02-29 MED ORDER — LACTATED RINGERS IV SOLN
INTRAVENOUS | Status: DC
  Administered 2016-02-29 (×2): via INTRAVENOUS

## 2016-02-29 MED ORDER — SUGAMMADEX SODIUM 200 MG/2ML IV SOLN
INTRAVENOUS | Status: AC
  Filled 2016-02-29: qty 2

## 2016-02-29 MED ORDER — METOCLOPRAMIDE HCL 5 MG/ML IJ SOLN: 10.0000 mg | Freq: Once | INTRAMUSCULAR | Status: DC | PRN

## 2016-02-29 MED ORDER — FENTANYL CITRATE (PF) 100 MCG/2ML IJ SOLN
INTRAMUSCULAR | Status: DC | PRN
  Administered 2016-02-29 (×5): 50 ug via INTRAVENOUS
  Administered 2016-02-29: 100 ug via INTRAVENOUS
  Administered 2016-02-29: 50 ug via INTRAVENOUS

## 2016-02-29 MED ORDER — ACETAMINOPHEN 500 MG PO TABS
500.0000 mg | ORAL_TABLET | Freq: Three times a day (TID) | ORAL | Status: DC
  Administered 2016-02-29 – 2016-03-03 (×7): 500 mg via ORAL
  Filled 2016-02-29 (×7): qty 1

## 2016-02-29 MED ORDER — DIPHENHYDRAMINE HCL 50 MG/ML IJ SOLN: 12.5000 mg | Freq: Four times a day (QID) | INTRAMUSCULAR | Status: DC | PRN

## 2016-02-29 MED ORDER — PHENOL 1.4 % MT LIQD: 2.0000 | OROMUCOSAL | Status: DC | PRN

## 2016-02-29 MED ORDER — GABAPENTIN 300 MG PO CAPS
300.0000 mg | ORAL_CAPSULE | ORAL | Status: AC
  Administered 2016-02-29: 300 mg via ORAL
  Filled 2016-02-29: qty 1

## 2016-02-29 MED ORDER — POLYETHYLENE GLYCOL 3350 17 G PO PACK
17.0000 g | PACK | Freq: Every day | ORAL | Status: DC
  Administered 2016-02-29: 17 g via ORAL
  Filled 2016-02-29 (×2): qty 1

## 2016-02-29 MED ORDER — ACETAMINOPHEN 325 MG PO TABS
325.0000 mg | ORAL_TABLET | Freq: Four times a day (QID) | ORAL | Status: DC | PRN
  Administered 2016-03-02: 650 mg via ORAL
  Filled 2016-02-29: qty 2

## 2016-02-29 MED ORDER — FUROSEMIDE 40 MG PO TABS
40.0000 mg | ORAL_TABLET | Freq: Every day | ORAL | Status: DC
  Administered 2016-02-29 – 2016-03-03 (×4): 40 mg via ORAL
  Filled 2016-02-29 (×4): qty 1

## 2016-02-29 MED ORDER — ACETAMINOPHEN 650 MG RE SUPP: 650.0000 mg | Freq: Four times a day (QID) | RECTAL | Status: DC | PRN

## 2016-02-29 MED ORDER — MIDAZOLAM HCL 5 MG/5ML IJ SOLN
INTRAMUSCULAR | Status: DC | PRN
  Administered 2016-02-29: 2 mg via INTRAVENOUS

## 2016-02-29 MED ORDER — BISACODYL 10 MG RE SUPP: 10.0000 mg | Freq: Two times a day (BID) | RECTAL | Status: DC | PRN

## 2016-02-29 MED ORDER — ROCURONIUM BROMIDE 100 MG/10ML IV SOLN
INTRAVENOUS | Status: DC | PRN
  Administered 2016-02-29: 20 mg via INTRAVENOUS
  Administered 2016-02-29: 50 mg via INTRAVENOUS

## 2016-02-29 MED ORDER — FENTANYL CITRATE (PF) 100 MCG/2ML IJ SOLN
INTRAMUSCULAR | Status: AC
  Filled 2016-02-29: qty 4

## 2016-02-29 MED ORDER — MIDAZOLAM HCL 2 MG/2ML IJ SOLN
INTRAMUSCULAR | Status: AC
  Filled 2016-02-29: qty 2

## 2016-02-29 MED ORDER — FENTANYL CITRATE (PF) 100 MCG/2ML IJ SOLN
INTRAMUSCULAR | Status: AC
  Filled 2016-02-29: qty 2

## 2016-02-29 MED ORDER — SIMETHICONE 80 MG PO CHEW: 40.0000 mg | CHEWABLE_TABLET | Freq: Four times a day (QID) | ORAL | Status: DC | PRN

## 2016-02-29 MED ORDER — HYDRALAZINE HCL 20 MG/ML IJ SOLN
10.0000 mg | INTRAMUSCULAR | Status: DC | PRN
Start: 2016-02-29 — End: 2016-03-03

## 2016-02-29 MED ORDER — ONDANSETRON 4 MG PO TBDP: 4.0000 mg | ORAL_TABLET | Freq: Four times a day (QID) | ORAL | Status: DC | PRN

## 2016-02-29 MED ORDER — 0.9 % SODIUM CHLORIDE (POUR BTL) OPTIME
TOPICAL | Status: DC | PRN
  Administered 2016-02-29: 1000 mL

## 2016-02-29 MED ORDER — CEFAZOLIN SODIUM-DEXTROSE 2-4 GM/100ML-% IV SOLN
2.0000 g | INTRAVENOUS | Status: AC
  Administered 2016-02-29: 2 g via INTRAVENOUS
  Filled 2016-02-29: qty 100

## 2016-02-29 MED ORDER — EPHEDRINE 5 MG/ML INJ
INTRAVENOUS | Status: AC
  Filled 2016-02-29: qty 10

## 2016-02-29 MED ORDER — GABAPENTIN 300 MG PO CAPS
300.0000 mg | ORAL_CAPSULE | Freq: Three times a day (TID) | ORAL | Status: DC
  Administered 2016-02-29 – 2016-03-03 (×8): 300 mg via ORAL
  Filled 2016-02-29 (×8): qty 1

## 2016-02-29 MED ORDER — MENTHOL 3 MG MT LOZG
1.0000 | LOZENGE | OROMUCOSAL | Status: DC | PRN
Start: 2016-02-29 — End: 2016-03-03

## 2016-02-29 MED ORDER — BUPIVACAINE HCL (PF) 0.25 % IJ SOLN
INTRAMUSCULAR | Status: AC
  Filled 2016-02-29: qty 90

## 2016-02-29 MED ORDER — SUGAMMADEX SODIUM 200 MG/2ML IV SOLN
INTRAVENOUS | Status: DC | PRN
  Administered 2016-02-29: 200 mg via INTRAVENOUS

## 2016-02-29 MED ORDER — SODIUM CHLORIDE 0.9% FLUSH
3.0000 mL | Freq: Two times a day (BID) | INTRAVENOUS | Status: DC
  Administered 2016-02-29: 3 mL via INTRAVENOUS

## 2016-02-29 MED ORDER — SODIUM CHLORIDE 0.9 % IR SOLN
Status: DC | PRN
  Administered 2016-02-29: 1000 mL

## 2016-02-29 MED ORDER — BLISTEX MEDICATED EX OINT
1.0000 "application " | TOPICAL_OINTMENT | Freq: Two times a day (BID) | CUTANEOUS | Status: DC
  Administered 2016-03-01 – 2016-03-02 (×5): 1 via TOPICAL
  Filled 2016-02-29: qty 6.3
  Filled 2016-02-29: qty 7

## 2016-02-29 MED ORDER — OXYCODONE HCL 5 MG PO TABS
5.0000 mg | ORAL_TABLET | ORAL | Status: DC | PRN
  Administered 2016-02-29 – 2016-03-02 (×8): 10 mg via ORAL
  Filled 2016-02-29 (×3): qty 2
  Filled 2016-02-29 (×2): qty 1
  Filled 2016-02-29 (×4): qty 2

## 2016-02-29 MED ORDER — PROPOFOL 10 MG/ML IV BOLUS
INTRAVENOUS | Status: DC | PRN
  Administered 2016-02-29: 200 mg via INTRAVENOUS

## 2016-02-29 MED ORDER — LIDOCAINE HCL (CARDIAC) 20 MG/ML IV SOLN
INTRAVENOUS | Status: DC | PRN
  Administered 2016-02-29: 60 mg via INTRAVENOUS

## 2016-02-29 MED ORDER — BUPIVACAINE LIPOSOME 1.3 % IJ SUSP
20.0000 mL | INTRAMUSCULAR | Status: DC
  Filled 2016-02-29 (×4): qty 20

## 2016-02-29 MED ORDER — PROCHLORPERAZINE EDISYLATE 5 MG/ML IJ SOLN
5.0000 mg | INTRAMUSCULAR | Status: DC | PRN
  Filled 2016-02-29: qty 2

## 2016-02-29 MED ORDER — SODIUM CHLORIDE 0.9% FLUSH: 3.0000 mL | INTRAVENOUS | Status: DC | PRN

## 2016-02-29 MED ORDER — CEFAZOLIN SODIUM-DEXTROSE 2-4 GM/100ML-% IV SOLN
2.0000 g | Freq: Three times a day (TID) | INTRAVENOUS | Status: AC
  Administered 2016-02-29 – 2016-03-01 (×2): 2 g via INTRAVENOUS
  Filled 2016-02-29 (×2): qty 100

## 2016-02-29 MED ORDER — SODIUM CHLORIDE 0.9 % IV SOLN: 250.0000 mL | INTRAVENOUS | Status: DC | PRN

## 2016-02-29 MED ORDER — MEPERIDINE HCL 25 MG/ML IJ SOLN: 6.2500 mg | INTRAMUSCULAR | Status: DC | PRN

## 2016-02-29 SURGICAL SUPPLY — 55 items
APPLICATOR COTTON TIP 6IN STRL (MISCELLANEOUS) IMPLANT
APPLIER CLIP LOGIC TI 5 (MISCELLANEOUS) IMPLANT
BINDER ABDOMINAL 12 ML 46-62 (SOFTGOODS) ×3 IMPLANT
BLADE SURG ROTATE 9660 (MISCELLANEOUS) IMPLANT
CANISTER SUCTION 2500CC (MISCELLANEOUS) ×3 IMPLANT
CHLORAPREP W/TINT 26ML (MISCELLANEOUS) ×3 IMPLANT
CLOSURE STERI-STRIP 1/2X4 (GAUZE/BANDAGES/DRESSINGS) ×1
CLOSURE WOUND 1/2 X4 (GAUZE/BANDAGES/DRESSINGS) ×3
CLSR STERI-STRIP ANTIMIC 1/2X4 (GAUZE/BANDAGES/DRESSINGS) ×2 IMPLANT
COVER SURGICAL LIGHT HANDLE (MISCELLANEOUS) ×3 IMPLANT
DEVICE SECURE STRAP 25 ABSORB (INSTRUMENTS) ×3 IMPLANT
DEVICE TROCAR PUNCTURE CLOSURE (ENDOMECHANICALS) ×3 IMPLANT
DRAPE WARM FLUID 44X44 (DRAPE) ×3 IMPLANT
DRSG TEGADERM 2-3/8X2-3/4 SM (GAUZE/BANDAGES/DRESSINGS) ×3 IMPLANT
DRSG TEGADERM 4X4.75 (GAUZE/BANDAGES/DRESSINGS) ×3 IMPLANT
ELECT REM PT RETURN 9FT ADLT (ELECTROSURGICAL) ×3
ELECTRODE REM PT RTRN 9FT ADLT (ELECTROSURGICAL) ×1 IMPLANT
GAUZE SPONGE 2X2 8PLY STRL LF (GAUZE/BANDAGES/DRESSINGS) ×1 IMPLANT
GLOVE BIOGEL PI IND STRL 8 (GLOVE) ×1 IMPLANT
GLOVE BIOGEL PI INDICATOR 8 (GLOVE) ×2
GLOVE ECLIPSE 8.0 STRL XLNG CF (GLOVE) ×3 IMPLANT
GOWN STRL REUS W/ TWL LRG LVL3 (GOWN DISPOSABLE) ×2 IMPLANT
GOWN STRL REUS W/ TWL XL LVL3 (GOWN DISPOSABLE) ×1 IMPLANT
GOWN STRL REUS W/TWL LRG LVL3 (GOWN DISPOSABLE) ×4
GOWN STRL REUS W/TWL XL LVL3 (GOWN DISPOSABLE) ×2
KIT BASIN OR (CUSTOM PROCEDURE TRAY) ×3 IMPLANT
KIT ROOM TURNOVER OR (KITS) ×3 IMPLANT
MARKER SKIN DUAL TIP RULER LAB (MISCELLANEOUS) ×3 IMPLANT
MESH VENTRALIGHT ST 8X10 (Mesh General) ×3 IMPLANT
NEEDLE 22X1 1/2 (OR ONLY) (NEEDLE) ×3 IMPLANT
NEEDLE SPNL 22GX3.5 QUINCKE BK (NEEDLE) ×3 IMPLANT
NS IRRIG 1000ML POUR BTL (IV SOLUTION) ×6 IMPLANT
PAD ARMBOARD 7.5X6 YLW CONV (MISCELLANEOUS) ×6 IMPLANT
PENCIL BUTTON HOLSTER BLD 10FT (ELECTRODE) ×3 IMPLANT
SCALPEL HARMONIC ACE (MISCELLANEOUS) IMPLANT
SCISSORS LAP 5X35 DISP (ENDOMECHANICALS) ×6 IMPLANT
SET IRRIG TUBING LAPAROSCOPIC (IRRIGATION / IRRIGATOR) ×3 IMPLANT
SLEEVE ENDOPATH XCEL 5M (ENDOMECHANICALS) ×6 IMPLANT
SPONGE GAUZE 2X2 STER 10/PKG (GAUZE/BANDAGES/DRESSINGS) ×2
STRIP CLOSURE SKIN 1/2X4 (GAUZE/BANDAGES/DRESSINGS) ×6 IMPLANT
SUT MNCRL AB 4-0 PS2 18 (SUTURE) ×6 IMPLANT
SUT PDS AB 1 CT  36 (SUTURE) ×10
SUT PDS AB 1 CT 36 (SUTURE) ×5 IMPLANT
SUT PROLENE 1 CT (SUTURE) ×33 IMPLANT
SUT VIC AB 2-0 SH 27 (SUTURE) ×4
SUT VIC AB 2-0 SH 27XBRD (SUTURE) ×2 IMPLANT
SUT VICRYL 0 UR6 27IN ABS (SUTURE) IMPLANT
TACKER 5MM HERNIA 3.5CML NAB (ENDOMECHANICALS) IMPLANT
TOWEL OR 17X24 6PK STRL BLUE (TOWEL DISPOSABLE) ×3 IMPLANT
TOWEL OR 17X26 10 PK STRL BLUE (TOWEL DISPOSABLE) ×3 IMPLANT
TRAY FOLEY CATH 16FR SILVER (SET/KITS/TRAYS/PACK) IMPLANT
TRAY LAPAROSCOPIC MC (CUSTOM PROCEDURE TRAY) ×3 IMPLANT
TROCAR XCEL NON-BLD 11X100MML (ENDOMECHANICALS) ×3 IMPLANT
TROCAR XCEL NON-BLD 5MMX100MML (ENDOMECHANICALS) ×3 IMPLANT
TUBING INSUFFLATION (TUBING) ×3 IMPLANT

## 2016-02-29 NOTE — Anesthesia Postprocedure Evaluation (Signed)
Anesthesia Post Note  Patient: [name]  Procedure(s) Performed: Procedure(s) (LRB): LAPAROSCOPIC VENTRAL WALL HERNIA REPAIR (N/A) INSERTION OF MESH (N/A)  Patient location during evaluation: PACU Anesthesia Type: General Level of consciousness: sedated and patient cooperative Pain management: pain level controlled Vital Signs Assessment: post-procedure vital signs reviewed and stable Respiratory status: spontaneous breathing Cardiovascular status: stable Anesthetic complications: no    Last Vitals:  Vitals:   02/29/16 1945 02/29/16 2057  BP: 130/73 (!) 143/92  Pulse: (!) 57 (!) 58  Resp: 13 16  Temp: 36.8 C 37 C    Last Pain:  Vitals:   02/29/16 2334  TempSrc:   PainSc: [place]

## 2016-02-29 NOTE — Anesthesia Procedure Notes (Signed)
Procedure Name: Intubation Date/Time: 02/29/2016 4:38 PM Performed by: Salli Quarry Xoie Kreuser Pre-anesthesia Checklist: Patient identified, Emergency Drugs available, Suction available and Patient being monitored Patient Re-evaluated:Patient Re-evaluated prior to inductionOxygen Delivery Method: Circle System Utilized Preoxygenation: Pre-oxygenation with 100% oxygen Intubation Type: IV induction Ventilation: Mask ventilation without difficulty Laryngoscope Size: Mac and 3 Grade View: Grade I Tube type: Oral Tube size: 7.5 mm Number of attempts: 1 Airway Equipment and Method: Stylet Placement Confirmation: ETT inserted through vocal cords under direct vision,  positive ETCO2 and breath sounds checked- equal and bilateral Secured at: 23 cm Tube secured with: Tape Dental Injury: Teeth and Oropharynx as per pre-operative assessment

## 2016-02-29 NOTE — Discharge Instructions (Signed)
HERNIA REPAIR: POST OP INSTRUCTIONS ° °###################################################################### ° °EAT °Gradually transition to a high fiber diet with a fiber supplement over the next few weeks after discharge.  Start with a pureed / full liquid diet (see below) ° °WALK °Walk an hour a day.  Control your pain to do that.   ° °CONTROL PAIN °Control pain so that you can walk, sleep, tolerate sneezing/coughing, go up/down stairs. ° °HAVE A BOWEL MOVEMENT DAILY °Keep your bowels regular to avoid problems.  OK to try a laxative to override constipation.  OK to use an antidairrheal to slow down diarrhea.  Call if not better after 2 tries ° °CALL IF YOU HAVE PROBLEMS/CONCERNS °Call if you are still struggling despite following these instructions. °Call if you have concerns not answered by these instructions ° °###################################################################### ° ° ° °1. DIET: Follow a light bland diet the first 24 hours after arrival home, such as soup, liquids, crackers, etc.  Be sure to include lots of fluids daily.  Avoid fast food or heavy meals as your are more likely to get nauseated.  Eat a low fat the next few days after surgery. °2. Take your usually prescribed home medications unless otherwise directed. °3. PAIN CONTROL: °a. Pain is best controlled by a usual combination of three different methods TOGETHER: °i. Ice/Heat °ii. Over the counter pain medication °iii. Prescription pain medication °b. Most patients will experience some swelling and bruising around the hernia(s) such as the bellybutton, groins, or old incisions.  Ice packs or heating pads (30-60 minutes up to 6 times a day) will help. Use ice for the first few days to help decrease swelling and bruising, then switch to heat to help relax tight/sore spots and speed recovery.  Some people prefer to use ice alone, heat alone, alternating between ice & heat.  Experiment to what works for you.  Swelling and bruising can take  several weeks to resolve.   °c. It is helpful to take an over-the-counter pain medication regularly for the first few weeks.  Choose one of the following that works best for you: °i. Naproxen (Aleve, etc)  Two 220mg tabs twice a day °ii. Ibuprofen (Advil, etc) Three 200mg tabs four times a day (every meal & bedtime) °iii. Acetaminophen (Tylenol, etc) 325-650mg four times a day (every meal & bedtime) °d. A  prescription for pain medication should be given to you upon discharge.  Take your pain medication as prescribed.  °i. If you are having problems/concerns with the prescription medicine (does not control pain, nausea, vomiting, rash, itching, etc), please call us [phone] to see if we need to switch you to a different pain medicine that will work better for you and/or control your side effect better. °ii. If you need a refill on your pain medication, please contact your pharmacy.  They will contact our office to request authorization. Prescriptions will not be filled after 5 pm or on week-ends. °4. Avoid getting constipated.  Between the surgery and the pain medications, it is common to experience some constipation.  Increasing fluid intake and taking a fiber supplement (such as Metamucil, Citrucel, FiberCon, MiraLax, etc) 1-2 times a day regularly will usually help prevent this problem from occurring.  A mild laxative (prune juice, Milk of Magnesia, MiraLax, etc) should be taken according to package directions if there are no bowel movements after 48 hours.   °5. Wash / shower every day.  You may shower over the dressings as they are waterproof.   °6. Remove   your waterproof bandages 5 days after surgery.  You may leave the incision open to air.  You may replace a dressing/Band-Aid to cover the incision for comfort if you wish.  Continue to shower over incision(s) after the dressing is off. ° ° ° °7. ACTIVITIES as tolerated:   °a. You may resume regular (light) daily activities beginning the next day--such  as daily self-care, walking, climbing stairs--gradually increasing activities as tolerated.  If you can walk 30 minutes without difficulty, it is safe to try more intense activity such as jogging, treadmill, bicycling, low-impact aerobics, swimming, etc. °b. Save the most intensive and strenuous activity for last such as sit-ups, heavy lifting, contact sports, etc  Refrain from any heavy lifting or straining until you are off narcotics for pain control.   °c. DO NOT PUSH THROUGH PAIN.  Let pain be your guide: If it hurts to do something, don't do it.  Pain is your body warning you to avoid that activity for another week until the pain goes down. °d. You may drive when you are no longer taking prescription pain medication, you can comfortably wear a seatbelt, and you can safely maneuver your car and apply brakes. °e. You may have sexual intercourse when it is comfortable.  °8. FOLLOW UP in our office °a. Please call [facility] at [phone] to set up an appointment to see your surgeon in the office for a follow-up appointment approximately 2-3 weeks after your surgery. °b. Make sure that you call for this appointment the day you arrive home to insure a convenient appointment time. °9.  IF YOU HAVE DISABILITY OR FAMILY LEAVE FORMS, BRING THEM TO THE OFFICE FOR PROCESSING.  DO NOT GIVE THEM TO YOUR DOCTOR. ° °WHEN TO CALL US [phone]: °1. Poor pain control °2. Reactions / problems with new medications (rash/itching, nausea, etc)  °3. Fever over 101.5 F (38.5 C) °4. Inability to urinate °5. Nausea and/or vomiting °6. Worsening swelling or bruising °7. Continued bleeding from incision. °8. Increased pain, redness, or drainage from the incision ° ° The clinic staff is available to answer your questions during regular business hours (8:30am-5pm).  Please don’t hesitate to call and ask to speak to one of our nurses for clinical concerns.  ° If you have a medical emergency, go to the nearest emergency room or call  911. ° A surgeon from [facility] is always on call at the hospitals in [place] ° °[facility] °[address], [address], [place], [place]  [address] ? ° [address] [address], ,    [address] °MAIN: [phone] ? TOLL FREE: [phone] ? FAX: [phone] °[email or web address] ° °STOP SMOKING! ° °We strongly recommend that you stop smoking.  Smoking increases the risk of surgery including infection in the form of an open wound, pus formation, abscess, hernia at an incision on the abdomen, etc.  You have an increased risk of other MAJOR complications such as stroke, heart attack, forming clots in the leg and/or lungs, and death.   ° °Smoking Cessation °Quitting smoking is important to your health and has many advantages. However, it is not always easy to quit since nicotine is a very addictive drug. Often times, people try 3 times or more before being able to quit. This document explains the best ways for you to prepare to quit smoking. Quitting takes hard work and a lot of effort, but you can do it. °ADVANTAGES OF QUITTING SMOKING °· You will live longer, feel   better, and live better. °· Your body will feel the impact of quitting smoking almost immediately. °· Within 20 minutes, blood pressure decreases. Your pulse returns to its normal level. °· After 8 hours, carbon monoxide levels in the blood return to normal. Your oxygen level increases. °· After 24 hours, the chance of having a heart attack starts to decrease. Your breath, hair, and body stop smelling like smoke. °· After 48 hours, damaged nerve endings begin to recover. Your sense of taste and smell improve. °· After 72 hours, the body is virtually free of nicotine. Your bronchial tubes relax and breathing becomes easier. °· After 2 to 12 weeks, lungs can hold more air. Exercise becomes easier and circulation improves. °· The risk of having a heart attack, stroke, cancer, or lung disease is greatly  reduced. °· After 1 year, the risk of coronary heart disease is cut in half. °· After 5 years, the risk of stroke falls to the same as a nonsmoker. °· After 10 years, the risk of lung cancer is cut in half and the risk of other cancers decreases significantly. °· After 15 years, the risk of coronary heart disease drops, usually to the level of a nonsmoker. °· If you are pregnant, quitting smoking will improve your chances of having a healthy baby. °· The people you live with, especially any children, will be healthier. °· You will have extra money to spend on things other than cigarettes. °QUESTIONS TO THINK ABOUT BEFORE ATTEMPTING TO QUIT °You may want to talk about your answers with your caregiver. °· Why do you want to quit? °· If you tried to quit in the past, what helped and what did not? °· What will be the most difficult situations for you after you quit? How will you plan to handle them? °· Who can help you through the tough times? Your family? Friends? A caregiver? °· What pleasures do you get from smoking? What ways can you still get pleasure if you quit? °Here are some questions to ask your caregiver: °· How can you help me to be successful at quitting? °· What medicine do you think would be best for me and how should I take it? °· What should I do if I need more help? °· What is smoking withdrawal like? How can I get information on withdrawal? °GET READY °· Set a quit date. °· Change your environment by getting rid of all cigarettes, ashtrays, matches, and lighters in your home, car, or work. Do not let people smoke in your home. °· Review your past attempts to quit. Think about what worked and what did not. °GET SUPPORT AND ENCOURAGEMENT °You have a better chance of being successful if you have help. You can get support in many ways. °· Tell your family, friends, and co-workers that you are going to quit and need their support. Ask them not to smoke around you. °· Get individual, group, or telephone  counseling and support. Programs are available at local hospitals and health centers. Call your local health department for information about programs in your area. °· Spiritual beliefs and practices may help some smokers quit. °· Download a "quit meter" on your computer to keep track of quit statistics, such as how long you have gone without smoking, cigarettes not smoked, and money saved. °· Get a self-help book about quitting smoking and staying off of tobacco. °LEARN NEW SKILLS AND BEHAVIORS °· Distract yourself from urges to smoke. Talk to someone, go for a walk,   or occupy your time with a task. °· Change your normal routine. Take a different route to work. Drink tea instead of coffee. Eat breakfast in a different place. °· Reduce your stress. Take a hot bath, exercise, or read a book. °· Plan something enjoyable to do every day. Reward yourself for not smoking. °· Explore interactive web-based programs that specialize in helping you quit. °GET MEDICINE AND USE IT CORRECTLY °Medicines can help you stop smoking and decrease the urge to smoke. Combining medicine with the above behavioral methods and support can greatly increase your chances of successfully quitting smoking. °· Nicotine replacement therapy helps deliver nicotine to your body without the negative effects and risks of smoking. Nicotine replacement therapy includes nicotine gum, lozenges, inhalers, nasal sprays, and skin patches. Some may be available over-the-counter and others require a prescription. °· Antidepressant medicine helps people abstain from smoking, but how this works is unknown. This medicine is available by prescription. °· Nicotinic receptor partial agonist medicine simulates the effect of nicotine in your brain. This medicine is available by prescription. °Ask your caregiver for advice about which medicines to use and how to use them based on your health history. Your caregiver will tell you what side effects to look out for if you  choose to be on a medicine or therapy. Carefully read the information on the package. Do not use any other product containing nicotine while using a nicotine replacement product.  °RELAPSE OR DIFFICULT SITUATIONS °Most relapses occur within the first 3 months after quitting. Do not be discouraged if you start smoking again. Remember, most people try several times before finally quitting. You may have symptoms of withdrawal because your body is used to nicotine. You may crave cigarettes, be irritable, feel very hungry, cough often, get headaches, or have difficulty concentrating. The withdrawal symptoms are only temporary. They are strongest when you first quit, but they will go away within 10 14 days. °To reduce the chances of relapse, try to: °· Avoid drinking alcohol. Drinking lowers your chances of successfully quitting. °· Reduce the amount of caffeine you consume. Once you quit smoking, the amount of caffeine in your body increases and can give you symptoms, such as a rapid heartbeat, sweating, and anxiety. °· Avoid smokers because they can make you want to smoke. °· Do not let weight gain distract you. Many smokers will gain weight when they quit, usually less than 10 pounds. Eat a healthy diet and stay active. You can always lose the weight gained after you quit. °· Find ways to improve your mood other than smoking. °FOR MORE INFORMATION  °www.smokefree.gov  ° ° °While it can be one of the most difficult things to do, the [place] community has programs to help you stop.  Consider talking with your primary care physician about options.  Also, Smoking Cessation classes are available through the [facility]: ° °The smoking cessation program is a proven-effective program from the American [employer] Association. The program is available for anyone 18 and older who currently smokes. The program lasts for 7 weeks and is 8 sessions. Each class will be approximately 1 1/2 hours. The program is every Tuesday.  All classes are  12-1:30pm and same location. ° °Event Location Information:  °Location: [facility] 2nd Floor Conference Room 2-037; located next to [facility] ° °Closest cross streets: [address] Entrance into the  Cancer Center is adjacent to the [facility]'s main entrance. The conference   room is located on the 2nd floor.  °Parking Instructions: Visitor parking is adjacent to [facility]'s main entrance and the Cancer Center ° ° ° A smoking cessation program is also offered through the [facility]. Register online at [email or web address] or call [phone] for more information.  ° Tobacco cessation counseling is available at [facility]. Call [phone] for a free appointment.  ° Tobacco cessation classes also are available through the [facility] in [place]. For information, call [phone].  ° The Patient Education Network features videos on tobacco cessation. Please consult your listings in the center of this book to find instructions on how to access this resource.  ° If you want more information, ask your nurse. ° ° ° °  ° °

## 2016-02-29 NOTE — Op Note (Signed)
02/29/2016  7:00 PM  PATIENT:  [name]  55 y.o. male  Patient Care Team: Pcp Not In System as PCP - General Raynelle Bring, MD as Consulting Physician (Urology) Clarene Essex, MD as Consulting Physician (Gastroenterology) Tyler Pita, MD as Consulting Physician (Radiation Oncology) Michael Boston, MD as Consulting Physician (General Surgery)  PRE-OPERATIVE DIAGNOSIS:   Incisional hernia (recurrent)  POST-OPERATIVE DIAGNOSIS:  Incisional hernia (recurrent)  PROCEDURE:   LAPAROSCOPIC VENTRAL WALL HERNIA REPAIR INSERTION OF MESH  SURGEON:  Surgeon(s): Michael Boston, MD  ASSISTANT: RN   ANESTHESIA:     General Local anesthesia field block: (0.25% bupivacaine &  liposomal  Bupivacaine [Experel])   EBL:  Total I/O In: 1000 [I.V.:1000] Out: 40 [Blood:40]  Delay start of Pharmacological VTE agent (>24hrs) due to surgical blood loss or risk of bleeding:  no  DRAINS: none   SPECIMEN:  No Specimen  DISPOSITION OF SPECIMEN:  N/A  COUNTS:  YES  PLAN OF CARE: Admit for overnight observation  PATIENT DISPOSITION:  PACU - hemodynamically stable.  INDICATION: Pleasant patient has developed a ventral wall abdominal hernia.   Recommendation was made for surgical repair:  The anatomy & physiology of the abdominal wall was discussed. The pathophysiology of hernias was discussed. Natural history risks without surgery including progeressive enlargement, pain, incarceration & strangulation was discussed. Contributors to complications such as smoking, obesity, diabetes, prior surgery, etc were discussed.  I feel the risks of no intervention will lead to serious problems that outweigh the operative risks; therefore, I recommended surgery to reduce and repair the hernia. I explained laparoscopic techniques with possible need for an open approach. I noted the probable use of mesh to patch and/or buttress the hernia repair  Risks such as bleeding, infection, abscess, need for further  treatment, heart attack, death, and other risks were discussed. I noted a good likelihood this will help address the problem. Goals of post-operative recovery were discussed as well. Possibility that this will not correct all symptoms was explained. I stressed the importance of low-impact activity, aggressive pain control, avoiding constipation, & not pushing through pain to minimize risk of post-operative chronic pain or injury. Possibility of reherniation especially with smoking, obesity, diabetes, immunosuppression, and other health conditions was discussed. We will work to minimize complications.  An educational handout further explaining the pathology & treatment options was given as well. Questions were answered. The patient expresses understanding & wishes to proceed with surgery.   OR FINDINGS: [id number] supraumbilical hernia with omentum.  Very thin abdominal wall.  Stretched out redundant skin   Type of repair - Laparoscopic underlay repair   Name of mesh -  Bard Ventralight dual sided (polypropylene / Seprafilm)  Size of mesh - Height 20 cm, Width 25 cm  Orientation:  Transverse   Mesh overlap - 7-10 cm  Placement of mesh - Intraperitoneal underlay repair   DESCRIPTION:   Informed consent was confirmed. The patient underwent general anaesthesia without difficulty. The patient was positioned appropriately. VTE prevention in place. The patient's abdomen was clipped, prepped, & draped in a sterile fashion. Surgical timeout confirmed our plan.  The patient was positioned in reverse Trendelenburg. Abdominal entry was gained using optical entry technique in the left upper abdomen. Entry was clean. I induced carbon dioxide insufflation. Camera inspection revealed no injury. Extra ports were carefully placed under direct laparoscopic visualization.   I could see the hernia in the central abdomen.   I did laparoscopic lysis of adhesions to reduce & remove the  greater omentum out of the hernia.   I freed off the falciform ligament off the anterior abdominal wall.  I primarily used and focused cautery scissors.    I made sure hemostasis was good.  I mapped out the region using a needle passer.   To ensure that I would have at least 5 cm radial coverage outside of the hernia defect given his obesity, cirrhosis, and smoking history,, I chose a 25x20 cm dual sided mesh.  I placed #1 Prolene stitches around its edge about every 5 cm = 14 total.  I rolled the mesh & placed into the peritoneal cavity through the 10 cm fascial defect.  I unrolled  the mesh and positioned it appropriately.  I secured the mesh to cover up the hernia defect using a laparoscopic suture passer to pass the tails of the Prolene through the abdominal wall & tagged them with clamps.  I started out in four corners to make sure I had the mesh centered under the hernia defect appropriately, and then proceeded to work in quadrants.  We evacuated CO2 & desufflated the abdomen.  I tied the fascial stitches down.  I freed the umbilical stalk off the fascia.  I freed excess hernia sac and trimmed that down.  Mobilize skin flaps.  I primarily closed the incisional hernia transversely with interrupted #1 PDS suture.  I reinsufflated the abdomen.  The mesh provided at least 7-10 cm circumferential coverage around the entire region of hernia defects.   I placed #1 Prolene sutures in an out the central part of the mesh to help tack the mesh centrally and also act as tears over the primary closure.  I did that 2.   I tacked the edges & central part of the mesh to the peritoneum/posterior rectus fascia with SecureStrap absorbable tacks.   A good field block of local anesthesia was used at fascial stitch sites & fascial closure areas.  Hemostasis was excellent.   Mesh laid well. Capnoperitoneum was evacuated. Ports were removed. The skin was closed with Monocryl at the port sites and Steri-Strips on the fascial stitch puncture sites.  Patient had very  stretched out redundant skin that I excised transversely and an inverse crescent.  Then closed the per umbilical wound using 2-0 Vicryl deep dermal sutures and 4 Monocryl subcuticular suture.  Sterile dressing applied.  Patient is being extubated to go to the recovery room.  I had discussed postop care with the patient and his mother in the holding area.   I am about to look for family again.  Instructions are written.  We will watch him at least overnight.  Hopefully home tomorrow if pain is adequately controlled.   [name], M.D., F.A.C.S. Gastrointestinal and Minimally Invasive Surgery [facility] [address] [place], [place] [id number] [phone] [address] / Paging

## 2016-02-29 NOTE — Anesthesia Preprocedure Evaluation (Signed)
Anesthesia Evaluation  Patient identified by MRN, date of birth, ID band Patient awake    Reviewed: Allergy & Precautions, NPO status , Patient's Chart, lab work & pertinent test results  Airway Mallampati: III       Dental  (+) Lower Dentures   Pulmonary pneumonia, resolved, Current Smoker,    Pulmonary exam normal breath sounds clear to auscultation       Cardiovascular negative cardio ROS Normal cardiovascular exam Rhythm:Regular Rate:Normal     Neuro/Psych Seizures -, Well Controlled,  PSYCHIATRIC DISORDERS Anxiety Depression  Neuromuscular disease    GI/Hepatic negative GI ROS, (+) Cirrhosis     substance abuse  alcohol use, 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    Lab Results  Component Value Date   WBC 6.7 02/26/2016   HGB 13.3 02/26/2016   HCT 40.4 02/26/2016   MCV 95.7 02/26/2016   PLT 156 02/26/2016     Chemistry      Component Value Date/Time   NA 138 02/26/2016 1336   NA 131 (L) 10/28/2013 0806   K 3.9 02/26/2016 1336   K 3.8 10/28/2013 0806   CL 107 02/26/2016 1336   CL 98 10/28/2013 0806   CO2 23 02/26/2016 1336   CO2 27 10/28/2013 0806   BUN 11 02/26/2016 1336   BUN 7 10/28/2013 0806   CREATININE 1.20 02/26/2016 1336   CREATININE 1.28 10/28/2013 0806      Component Value Date/Time   CALCIUM 9.1 02/26/2016 1336   CALCIUM 9.3 10/28/2013 0806   ALKPHOS 66 02/26/2016 1336   ALKPHOS 121 (H) 10/28/2013 0806   AST 18 02/26/2016 1336   AST 23 10/28/2013  0806   ALT 11 (L) 02/26/2016 1336   ALT 22 10/28/2013 0806   BILITOT 0.8 02/26/2016 1336   BILITOT 0.5 10/28/2013 0806     EKG: sinus bradycardia, left axis deviation, anterior infarct. Echo 2011: Left ventricle: The cavity size was normal. Wall thickness was  increased in a pattern of moderate LVH. There was moderate  concentric hypertrophy. Systolic function was normal. The  estimated ejection fraction was in the range of 55% to 60%. Left  ventricular diastolic function parameters were normal. - Mitral valve: Calcified annulus. - Left atrium: The atrium was mildly dilated. - Atrial septum: No defect or patent foramen ovale was identified Anesthesia Physical Anesthesia Plan  ASA: III  Anesthesia Plan: General   Post-op Pain Management:    Induction: Intravenous  Airway Management Planned: Oral ETT  Additional Equipment:   Intra-op Plan:   Post-operative Plan: Extubation in OR  Informed Consent: I have reviewed the patients History and Physical, chart, labs and discussed the procedure including the risks, benefits and alternatives for the proposed anesthesia with the patient or authorized representative who has indicated his/her understanding and acceptance.   Dental advisory given  Plan Discussed with: Anesthesiologist, CRNA and Surgeon  Anesthesia Plan Comments:  Anesthesia Quick Evaluation  

## 2016-02-29 NOTE — Interval H&P Note (Signed)
History and Physical Interval Note:  02/29/2016 3:53 PM  [name]  has presented today for surgery, with the diagnosis of Hernia in abdomen  The various methods of treatment have been discussed with the patient and family. After consideration of risks, benefits and other options for treatment, the patient has consented to  Procedure(s): [place] (N/A) INSERTION OF MESH (N/A) as a surgical intervention .  The patient's history has been reviewed, patient examined, no change in status, stable for surgery.  I have reviewed the patient's chart and labs.  Questions were answered to the patient's satisfaction.     Brittlyn Cloe C.

## 2016-02-29 NOTE — Transfer of Care (Signed)
Immediate Anesthesia Transfer of Care Note  Patient: [name]  Procedure(s) Performed: Procedure(s): LAPAROSCOPIC VENTRAL WALL HERNIA REPAIR (N/A) INSERTION OF MESH (N/A)  Patient Location: PACU  Anesthesia Type:General  Level of Consciousness: awake, alert , oriented and patient cooperative  Airway & Oxygen Therapy: Patient Spontanous Breathing and Patient connected to nasal cannula oxygen  Post-op Assessment: Report given to RN and Post -op Vital signs reviewed and stable  Post vital signs: Reviewed and stable  Last Vitals:  Vitals:   02/29/16 1229 02/29/16 1900  BP: (!) 159/63 (P) 122/70  Pulse: 65   Resp: 18   Temp: 37 C (P) 36.9 C    Last Pain:  Vitals:   02/29/16 1229  TempSrc: Oral         Complications: No apparent anesthesia complications

## 2016-02-29 NOTE — H&P (View-Only) (Signed)
[name]  Location: [facility] Patient #: [id number] DOB: 24-Feb-1961 Single / Language: [name] / Race: White Male   History of Present Illness  The patient is a 55 year old male who presents with an incisional hernia. Note for "Incisional hernia": Patient returns with concerns of recurrent incisional hernia.  Obese smoking cirrhotic male. Underwent robotic prostatectomy in April for cancer. The left wound breakdown and dehiscence. Underwent urgent re-repair. Developed wound breakdown with open wound. Eventually was closing down. Surgical consultation requested. I discussed with him in the hospital about quitting smoking and trying to control cirrhosis to minimize chance of recurrence.  He comes today with his sister. He denies much pain. He's cut down on his smoking from 2 packs a day to less than a half pack a day. Trying these patches. Ling. There was some concern on CT scan of some chronic fluid collections near the right iliac lymphadenectomy site. Concerned that this may be explaining why he has some right lower extremity swelling. His sister talks about he is getting some drainage procedure tomorrow at the hospital. I guess this is interventional radiology trying to aspirate or drain and chronic lymphocytic fluid collection?.  He's hoping to have surgery done to fix the hernia as it is getting larger and somewhat bothersome.   Other Problems Marjean Donna, CMA; 11/26/2015 9:24 AM) Seizure Disorder Thyroid Disease Umbilical Hernia Repair  Past Surgical History Marjean Donna, CMA; 11/26/2015 9:24 AM) Foot Surgery Left. Spinal Surgery - Lower Back Spinal Surgery - Neck TURP  Diagnostic Studies History Marjean Donna, CMA; 11/26/2015 9:24 AM) Colonoscopy 1-5 years ago  Allergies Davy Pique Bynum, CMA; 11/26/2015 9:25 AM) Hydrocodone-Acetaminophen *ANALGESICS - OPIOID* Ibuprofen *ANALGESICS - ANTI-INFLAMMATORY*  Medication History (Sonya Bynum,  CMA; 11/26/2015 9:26 AM) Levothyroxine Sodium (112MCG Tablet, Oral) Active. Furosemide (40MG  Tablet, Oral) Active. Spironolactone (50MG  Tablet, Oral) Active. Famotidine (20MG  Tablet, Oral) Active. Sildenafil Citrate (20MG  Tablet, Oral) Active. Citalopram Hydrobromide (20MG  Tablet, Oral) Active. LORazepam (1MG  Tablet, Oral) Active. Omeprazole (40MG  Capsule DR, Oral) Active. Medications Reconciled  Social History Marjean Donna, CMA; 11/26/2015 9:24 AM) Alcohol use Moderate alcohol use. No caffeine use Tobacco use Current every day smoker.  Family History Marjean Donna, [place]; 11/26/2015 9:24 AM) Alcohol Abuse Father. Diabetes Mellitus Father. Heart Disease Father.    Review of Systems ([place]; 11/26/2015 9:24 AM) Cardiovascular Present- Leg Cramps. Not Present- Chest Pain, Difficulty Breathing Lying Down, Palpitations, Rapid Heart Rate, Shortness of Breath and Swelling of Extremities. Gastrointestinal Present- Abdominal Pain. Not Present- Bloating, Bloody Stool, Change in Bowel Habits, Chronic diarrhea, Constipation, Difficulty Swallowing, Excessive gas, Gets full quickly at meals, Hemorrhoids, Indigestion, Nausea, Rectal Pain and Vomiting. Musculoskeletal Present- Swelling of Extremities. Not Present- Back Pain, Joint Pain, Joint Stiffness, Muscle Pain and Muscle Weakness. Psychiatric Present- Anxiety. Not Present- Bipolar, Change in Sleep Pattern, Depression, Fearful and Frequent crying. Endocrine Not Present- Cold Intolerance, Excessive Hunger, Hair Changes, Heat Intolerance, Hot flashes and New Diabetes. Hematology Present- Easy Bruising. Not Present- Blood Thinners, Excessive bleeding, Gland problems, HIV and Persistent Infections.  Vitals (Sonya Bynum CMA; 11/26/2015 9:24 AM) 11/26/2015 9:24 AM Weight: 216 lb Height: 71in Body Surface Area: 2.18 m Body Mass Index: 30.13 kg/m  Temp.: 3F(Temporal)  Pulse: 79 (Regular)  BP: 126/82 (Sitting, Left  Arm, Standard)   There were no vitals taken for this visit.    Physical Exam [name] MD; 11/26/2015 10:01 AM) General Mental Status-Alert. General Appearance-Not in acute distress, Not Sickly. Orientation-Oriented X3. Hydration-Well hydrated. Voice-Normal.  Note: Calm. Relaxed. In no acute distress. Mild smell of tobacco.   Integumentary Global Assessment Upon inspection and palpation of skin surfaces of the - Axillae: non-tender, no inflammation or ulceration, no drainage. and Distribution of scalp and body hair is normal. General Characteristics Temperature - normal warmth is noted.  Head and Neck Head-normocephalic, atraumatic with no lesions or palpable masses. Face Global Assessment - atraumatic, no absence of expression. Neck Global Assessment - no abnormal movements, no bruit auscultated on the right, no bruit auscultated on the left, no decreased range of motion, non-tender. Trachea-midline. Thyroid Gland Characteristics - non-tender.  Eye Eyeball - Left-Extraocular movements intact, No Nystagmus. Eyeball - Right-Extraocular movements intact, No Nystagmus. Cornea - Left-No Hazy. Cornea - Right-No Hazy. Sclera/Conjunctiva - Left-No scleral icterus, No Discharge. Sclera/Conjunctiva - Right-No scleral icterus, No Discharge. Pupil - Left-Direct reaction to light normal. Pupil - Right-Direct reaction to light normal.  ENMT Ears Pinna - Left - no drainage observed, no generalized tenderness observed. Right - no drainage observed, no generalized tenderness observed. Nose and Sinuses External Inspection of the Nose - no destructive lesion observed. Inspection of the nares - Left - quiet respiration. Right - quiet respiration. Mouth and Throat Lips - Upper Lip - no fissures observed, no pallor noted. Lower Lip - no fissures observed, no pallor noted. Nasopharynx - no discharge present. Oral Cavity/Oropharynx - Tongue - no dryness  observed. Oral Mucosa - no cyanosis observed. Hypopharynx - no evidence of airway distress observed.  Chest and Lung Exam Inspection Movements - Normal and Symmetrical. Accessory muscles - No use of accessory muscles in breathing. Palpation Palpation of the chest reveals - Non-tender. Auscultation Breath sounds - Normal and Clear.  Cardiovascular Auscultation Rhythm - Regular. Murmurs & Other Heart Sounds - Auscultation of the heart reveals - No Murmurs and No Systolic Clicks.  Abdomen Inspection Inspection of the abdomen reveals - No Visible peristalsis and No Abnormal pulsations. Umbilicus - No Bleeding, No Urine drainage. Palpation/Percussion Palpation and Percussion of the abdomen reveal - Soft, Non Tender, No Rebound tenderness, No Rigidity (guarding) and No Cutaneous hyperesthesia. Note: Daily soft and obese. Moderate diastases recti. Supraumbilical hernia within diastases. Reducible.   Male Genitourinary Sexual Maturity Tanner 5 - Adult hair pattern and Adult penile size and shape.  Peripheral Vascular Upper Extremity Inspection - Left - No Cyanotic nailbeds, Not Ischemic. Right - No Cyanotic nailbeds, Not Ischemic.  Neurologic Neurologic evaluation reveals -normal attention span and ability to concentrate, able to name objects and repeat phrases. Appropriate fund of knowledge , normal sensation and normal coordination. Mental Status Affect - not angry, not paranoid. Cranial Nerves-Normal Bilaterally. Gait-Normal.  Neuropsychiatric Mental status exam performed with findings of-able to articulate well with normal speech/language, rate, volume and coherence, thought content normal with ability to perform basic computations and apply abstract reasoning and no evidence of hallucinations, delusions, obsessions or homicidal/suicidal ideation.  Musculoskeletal Global Assessment Spine, Ribs and Pelvis - no instability, subluxation or laxity. Right Upper Extremity -  no instability, subluxation or laxity. Note: Right greater than left lower extremity edema   Lymphatic Head & Neck  General Head & Neck Lymphatics: Bilateral - Description - No Localized lymphadenopathy. Axillary  General Axillary Region: Bilateral - Description - No Localized lymphadenopathy. Femoral & Inguinal  Generalized Femoral & Inguinal Lymphatics: Left - Description - No Localized lymphadenopathy. Right - Description - No Localized lymphadenopathy.    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  Cirrhosis appears to be stable. Tolerated the 2 prior operations and was seen by gastroenterology last fall without any major issues. No change in LFTs. Okay to proceed with surgery from their standpoint  There is concern of a chronic right pelvic fluid collection that seems to be decreasing venous return at giving him chronic right lower extremity swelling. The patient and his sister note he is due for some drainage procedure tomorrow. I do not know that paracentesis or other plan to place a drain to deal with a possible chronic lymph leak/seroma. Once that has resolved and there are no more drains, would like urology to make sure he is okay to proceed with surgery.  Once he quit smoking, plan surgical repair. We do underlay repair with mesh since he has failed 2 primary repairs. I don't get a strong sense that he has significant ascites, nor any infection. I again stressed to him the importance of quitting smoking to minimize recurrence and other complications.  PREOP - [place] - ENCOUNTER FOR PREOPERATIVE EXAMINATION FOR GENERAL SURGICAL PROCEDURE (Z01.818) Current Plans You are being scheduled for surgery - Our schedulers will call you.  You should hear from our office's scheduling department within 5 working days about the  location, date, and time of surgery. We try to make accommodations for patient's preferences in scheduling surgery, but sometimes the OR schedule or the surgeon's schedule prevents [name] from making those accommodations.  If you have not heard from our office [phone]) in 5 working days, call the office and ask for your surgeon's nurse.  If you have other questions about your diagnosis, plan, or surgery, call the office and ask for your surgeon's nurse.  Written instructions provided The anatomy & physiology of the abdominal wall was discussed. The pathophysiology of hernias was discussed. Natural history risks without surgery including progeressive enlargement, pain, incarceration, & strangulation was discussed. Contributors to complications such as smoking, obesity, diabetes, prior surgery, etc were discussed.  I feel the risks of no intervention will lead to serious problems that outweigh the operative risks; therefore, I recommended surgery to reduce and repair the hernia. I explained laparoscopic techniques with possible need for an open approach. I noted the probable use of mesh to patch and/or buttress the hernia repair  Risks such as bleeding, infection, abscess, need for further treatment, heart attack, death, and other risks were discussed. I noted a good likelihood this will help address the problem. Goals of post-operative recovery were discussed as well. Possibility that this will not correct all symptoms was explained. I stressed the importance of low-impact activity, aggressive pain control, avoiding constipation, & not pushing through pain to minimize risk of post-operative chronic pain or injury. Possibility of reherniation especially with smoking, obesity, diabetes, immunosuppression, and other health conditions was discussed. We will work to minimize complications.  An educational handout further explaining the pathology & treatment options was given as well. Questions were  answered. The patient expresses understanding & wishes to proceed with surgery.  Pt Education - [facility] Hernia Post-Op HCI (Thomasene Dubow): discussed with patient and provided information. Pt Education - [facility] Pain Control (Ricci Paff) Pt Education - Pamphlet Given - Laparoscopic Hernia Repair: discussed with patient and provided information. TOBACCO ABUSE (Z72.0) Current Plans Pt Education - [facility] STOP SMOKING! DIASTASIS RECTI (M62.08) Current Plans Pt Education - [facility] Diastasis Recti: discussed with patient and provided information.  I have re-reviewed the the patient's records, history, medications,  and allergies.  Patient's mental status cleared.  Cleared neurologically from traumatic event. The patient agrees to proceed.   [name], M.D., F.A.C.S. Gastrointestinal and Minimally Invasive Surgery [facility] [address] [place], [place] [address] [phone] Main / Paging

## 2016-03-01 MED ORDER — ADULT MULTIVITAMIN W/MINERALS CH
1.0000 | ORAL_TABLET | Freq: Every day | ORAL | Status: DC
  Administered 2016-03-01 – 2016-03-03 (×3): 1 via ORAL
  Filled 2016-03-01 (×3): qty 1

## 2016-03-01 MED ORDER — METHOCARBAMOL 500 MG PO TABS
500.0000 mg | ORAL_TABLET | Freq: Three times a day (TID) | ORAL | Status: DC
  Administered 2016-03-01 – 2016-03-03 (×7): 500 mg via ORAL
  Filled 2016-03-01 (×7): qty 1

## 2016-03-01 NOTE — Progress Notes (Signed)
Initial Nutrition Assessment  DOCUMENTATION CODES:   Obesity unspecified  INTERVENTION:  Provide Multivitamin with minerals daily Recommend providing 500 mg of Vitamin C daily   NUTRITION DIAGNOSIS:   Increased nutrient needs related to acute illness as evidenced by estimated needs.   GOAL:   Patient will meet greater than or equal to 90% of their needs   MONITOR:   PO intake, Supplement acceptance, Weight trends, Skin, I & O's  REASON FOR ASSESSMENT:   Malnutrition Screening Tool    ASSESSMENT:   55 year old male with history of liver cirrhosis and prostate cancer presents with an incisional hernia. Underwent robotic prostatectomy in April for cancer.  The left wound breakdown and dehiscence.  Underwent urgent re-repair.  Developed wound breakdown with open wound.   Pt reports losing weight since April due to moving around a lot and having surgery. He reports losing from 225 lbs to 218 lbs during this time- 3% wt loss. He reports being in a lot of pain at time of visit, but still has a good appetite and is eating well. He is on full liquids and reports eating soup, ice cream and applesauce for lunch.  RD encouraged healthful eating with lean protein and lots of fruits and vegetables. Recommended that patient take a multivitamin and vitamin C supplement daily until wound heals due to history of poor wound healing.   Labs reviewed.   Diet Order:  Diet - low sodium heart healthy Diet full liquid Room service appropriate? Yes; Fluid consistency: Thin  Skin:  Wound (see comment) (closed abdominal incision)  Last BM:  11/2  Height:   Ht Readings from Last 1 Encounters:  02/29/16 5\' 10"  (1.778 m)    Weight:   Wt Readings from Last 1 Encounters:  02/29/16 218 lb (98.9 kg)    Ideal Body Weight:  75.45 kg  BMI:  Body mass index is 31.28 kg/m.  Estimated Nutritional Needs:   Kcal:  2000-2200  Protein:  100-115 grams  Fluid:  2.6 L/day  EDUCATION NEEDS:    No education needs identified at this time  [place], CSP, LDN Inpatient Clinical Dietitian Pager: [phone] After Hours Pager: [phone]

## 2016-03-01 NOTE — Progress Notes (Signed)
Patient arrived to unit arousable but drowsy. Patient complaining of pain 10/10. Orders to be released, reviewed, and obtain medication for patient.

## 2016-03-01 NOTE — Progress Notes (Signed)
1 Day Post-Op  Subjective: Very sore; pain overnight. Burping. No flatus. Walked overnight. Tolerated solids  Objective: Vital signs in last 24 hours: Temp:  [98.2 F (36.8 C)-98.8 F (37.1 C)] 98.3 F (36.8 C) (11/04 0900) Pulse Rate:  [57-71] 71 (11/04 0900) Resp:  [13-21] 16 (11/04 0900) BP: (93-159)/(53-92) 93/53 (11/04 0900) SpO2:  [92 %-100 %] 92 % (11/04 0900) Weight:  [98.1 kg (216 lb 4.8 oz)-98.9 kg (218 lb)] 98.9 kg (218 lb) (11/03 2057) Last BM Date: 02/28/16  Intake/Output from previous day: 11/03 0701 - 11/04 0700 In: 1920.8 [P.O.:360; I.V.:1460.8; IV Piggyback:100] Out: [id number] [Urine:1000; Blood:40] Intake/Output this shift: Total I/O In: 120 [P.O.:120] Out: -   Alert, nad cta Reg Soft, protuberant; mild distension; +binder; dressings c/d/i; approp mild TTP No edema  Lab Results:  No results for input(s): WBC, HGB, HCT, PLT in the last 72 hours. BMET No results for input(s): NA, K, CL, CO2, GLUCOSE, BUN, CREATININE, CALCIUM in the last 72 hours. PT/INR No results for input(s): LABPROT, INR in the last 72 hours. ABG No results for input(s): PHART, HCO3 in the last 72 hours.  Invalid input(s): PCO2, PO2  Studies/Results: No results found.  Anti-infectives: Anti-infectives    Start     Dose/Rate Route Frequency Ordered Stop   03/01/16 0600  ceFAZolin (ANCEF) IVPB 2g/100 mL premix     2 g 200 mL/hr over 30 Minutes Intravenous On call to O.R. 02/29/16 1204 02/29/16 1653   03/01/16 0000  ceFAZolin (ANCEF) IVPB 2g/100 mL premix     2 g 200 mL/hr over 30 Minutes Intravenous Every 8 hours 02/29/16 1936 03/01/16 [id number]      Assessment/Plan: Incisional hernia s/p Procedure(s): LAPAROSCOPIC VENTRAL WALL HERNIA REPAIR (N/A) INSERTION OF MESH (N/A) H/o cirrhosis  Not ready for discharge pulm toilet Await return of flatus Ambulate Discussed nature of discomfort with this type of hernia repair Cont home meds Cont chemical vte prophylaxis Change  robaxin to schedule  Repeat labs in am  Leighton Ruff. Redmond Pulling, MD, FACS General, Bariatric, & Minimally Invasive Surgery [facility], [place]   LOS: 0 days    Gayland Curry 03/01/2016

## 2016-03-02 DIAGNOSIS — Z6831 Body mass index (BMI) 31.0-31.9, adult: Secondary | ICD-10-CM | POA: Diagnosis not present

## 2016-03-02 DIAGNOSIS — E039 Hypothyroidism, unspecified: Secondary | ICD-10-CM | POA: Diagnosis present

## 2016-03-02 DIAGNOSIS — Z8249 Family history of ischemic heart disease and other diseases of the circulatory system: Secondary | ICD-10-CM | POA: Diagnosis not present

## 2016-03-02 DIAGNOSIS — G40909 Epilepsy, unspecified, not intractable, without status epilepticus: Secondary | ICD-10-CM | POA: Diagnosis present

## 2016-03-02 DIAGNOSIS — E876 Hypokalemia: Secondary | ICD-10-CM | POA: Diagnosis present

## 2016-03-02 DIAGNOSIS — E669 Obesity, unspecified: Secondary | ICD-10-CM | POA: Diagnosis present

## 2016-03-02 DIAGNOSIS — Z811 Family history of alcohol abuse and dependence: Secondary | ICD-10-CM | POA: Diagnosis not present

## 2016-03-02 DIAGNOSIS — Z8546 Personal history of malignant neoplasm of prostate: Secondary | ICD-10-CM | POA: Diagnosis not present

## 2016-03-02 DIAGNOSIS — K7460 Unspecified cirrhosis of liver: Secondary | ICD-10-CM | POA: Diagnosis present

## 2016-03-02 DIAGNOSIS — Z923 Personal history of irradiation: Secondary | ICD-10-CM | POA: Diagnosis not present

## 2016-03-02 DIAGNOSIS — K432 Incisional hernia without obstruction or gangrene: Secondary | ICD-10-CM | POA: Diagnosis present

## 2016-03-02 DIAGNOSIS — Z833 Family history of diabetes mellitus: Secondary | ICD-10-CM | POA: Diagnosis not present

## 2016-03-02 DIAGNOSIS — F17210 Nicotine dependence, cigarettes, uncomplicated: Secondary | ICD-10-CM | POA: Diagnosis present

## 2016-03-02 DIAGNOSIS — F418 Other specified anxiety disorders: Secondary | ICD-10-CM | POA: Diagnosis present

## 2016-03-02 DIAGNOSIS — Z806 Family history of leukemia: Secondary | ICD-10-CM | POA: Diagnosis not present

## 2016-03-02 LAB — COMPREHENSIVE METABOLIC PANEL
ALT: 7 U/L — AB (ref 17–63)
AST: 20 U/L (ref 15–41)
Albumin: 2.9 g/dL — ABNORMAL LOW (ref 3.5–5.0)
Alkaline Phosphatase: 61 U/L (ref 38–126)
Anion gap: 8 (ref 5–15)
BUN: 7 mg/dL (ref 6–20)
CHLORIDE: 95 mmol/L — AB (ref 101–111)
CO2: 30 mmol/L (ref 22–32)
CREATININE: 1.14 mg/dL (ref 0.61–1.24)
Calcium: 8.8 mg/dL — ABNORMAL LOW (ref 8.9–10.3)
Glucose, Bld: 103 mg/dL — ABNORMAL HIGH (ref 65–99)
POTASSIUM: 3.4 mmol/L — AB (ref 3.5–5.1)
SODIUM: 133 mmol/L — AB (ref 135–145)
Total Bilirubin: 1.2 mg/dL (ref 0.3–1.2)
Total Protein: 6 g/dL — ABNORMAL LOW (ref 6.5–8.1)

## 2016-03-02 MED ORDER — POTASSIUM CHLORIDE CRYS ER 20 MEQ PO TBCR
40.0000 meq | EXTENDED_RELEASE_TABLET | Freq: Once | ORAL | Status: AC
  Administered 2016-03-02: 40 meq via ORAL
  Filled 2016-03-02: qty 2

## 2016-03-02 MED ORDER — OXYCODONE HCL 5 MG PO TABS
5.0000 mg | ORAL_TABLET | ORAL | Status: DC | PRN
  Administered 2016-03-02 – 2016-03-03 (×4): 10 mg via ORAL
  Filled 2016-03-02 (×4): qty 2

## 2016-03-02 MED ORDER — BISACODYL 10 MG RE SUPP: 10.0000 mg | Freq: Once | RECTAL | Status: DC

## 2016-03-02 NOTE — Progress Notes (Signed)
2 Days Post-Op  Subjective: Still requiring frequent IV pain for breakthru "rough night", fair amount of pain No flatus No n/v. Not much burping.  Eating well Ambulating.  Pt denies BM  Objective: Vital signs in last 24 hours: Temp:  [98.9 F (37.2 C)-99.3 F (37.4 C)] 99.3 F (37.4 C) (11/05 0543) Pulse Rate:  [67-76] 68 (11/05 0543) Resp:  [16-19] 19 (11/05 0543) BP: (101-155)/(61-77) 113/61 (11/05 0543) SpO2:  [94 %-100 %] 100 % (11/05 0543) Last BM Date: 03/01/16  Intake/Output from previous day: 11/04 0701 - 11/05 0700 In: 880 [P.O.:480; I.V.:400] Out: 4500 [Urine:4500] Intake/Output this shift: No intake/output data recorded.  Alert, not ill appearing Seems a little more comfortable today cta Reg Soft, obese, incisions c/d/i; hypoBS; +binder No edema  Lab Results:  No results for input(s): WBC, HGB, HCT, PLT in the last 72 hours. BMET  Recent Labs  03/02/16 0558  NA 133*  K 3.4*  CL 95*  CO2 30  GLUCOSE 103*  BUN 7  CREATININE 1.14  CALCIUM 8.8*   PT/INR No results for input(s): LABPROT, INR in the last 72 hours. ABG No results for input(s): PHART, HCO3 in the last 72 hours.  Invalid input(s): PCO2, PO2  Studies/Results: No results found.  Anti-infectives: Anti-infectives    Start     Dose/Rate Route Frequency Ordered Stop   03/01/16 0600  ceFAZolin (ANCEF) IVPB 2g/100 mL premix     2 g 200 mL/hr over 30 Minutes Intravenous On call to O.R. 02/29/16 1204 02/29/16 1653   03/01/16 0000  ceFAZolin (ANCEF) IVPB 2g/100 mL premix     2 g 200 mL/hr over 30 Minutes Intravenous Every 8 hours 02/29/16 1936 03/01/16 [id number]      Assessment/Plan: s/p Procedure(s): LAPAROSCOPIC VENTRAL WALL HERNIA REPAIR (N/A) INSERTION OF MESH (N/A) H/o cirrhosis  Not ready for discharge pulm toilet Await return of flatus Ambulate reDiscussed nature of discomfort with this type of hernia repair Cont home meds Cont chemical vte prophylaxis Change robaxin to  schedule  Repeat labs in am Suppository Hypokalemia - replace potassium with oral Change frequency of oxycodone Should be ready tomorrow  Leighton Ruff. Redmond Pulling, MD, FACS General, Bariatric, & Minimally Invasive Surgery [facility], [place]    LOS: 0 days    Gayland Curry 03/02/2016

## 2016-03-03 ENCOUNTER — Encounter (HOSPITAL_COMMUNITY): Payer: Self-pay | Admitting: Surgery

## 2016-03-03 LAB — COMPREHENSIVE METABOLIC PANEL
ALBUMIN: 3.2 g/dL — AB (ref 3.5–5.0)
ALK PHOS: 65 U/L (ref 38–126)
ALT: 8 U/L — ABNORMAL LOW (ref 17–63)
ANION GAP: 8 (ref 5–15)
AST: 16 U/L (ref 15–41)
BILIRUBIN TOTAL: 1 mg/dL (ref 0.3–1.2)
BUN: 11 mg/dL (ref 6–20)
CALCIUM: 9.1 mg/dL (ref 8.9–10.3)
CO2: 30 mmol/L (ref 22–32)
Chloride: 96 mmol/L — ABNORMAL LOW (ref 101–111)
Creatinine, Ser: 1.09 mg/dL (ref 0.61–1.24)
GFR calc Af Amer: >60 mL/min (ref >60)
GLUCOSE: 100 mg/dL — AB (ref 65–99)
Potassium: 3.7 mmol/L (ref 3.5–5.1)
Sodium: 134 mmol/L — ABNORMAL LOW (ref 135–145)
TOTAL PROTEIN: 6.6 g/dL (ref 6.5–8.1)

## 2016-03-03 MED ORDER — SODIUM CHLORIDE 0.9% FLUSH: 3.0000 mL | Freq: Two times a day (BID) | INTRAVENOUS | Status: DC

## 2016-03-03 MED ORDER — SODIUM CHLORIDE 0.9 % IV SOLN: 250.0000 mL | INTRAVENOUS | Status: DC | PRN

## 2016-03-03 MED ORDER — POLYETHYLENE GLYCOL 3350 17 G PO PACK
17.0000 g | PACK | Freq: Two times a day (BID) | ORAL | Status: DC
  Filled 2016-03-03: qty 1

## 2016-03-03 MED ORDER — SODIUM CHLORIDE 0.9% FLUSH: 3.0000 mL | INTRAVENOUS | Status: DC | PRN

## 2016-03-03 MED ORDER — OXYCODONE HCL 5 MG PO TABS
10.0000 mg | ORAL_TABLET | ORAL | Status: DC | PRN
  Administered 2016-03-03: 20 mg via ORAL
  Filled 2016-03-03: qty 4

## 2016-03-03 MED ORDER — LACTATED RINGERS IV BOLUS (SEPSIS): 1000.0000 mL | Freq: Three times a day (TID) | INTRAVENOUS | Status: DC | PRN
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
## 2016-03-03 NOTE — Progress Notes (Signed)
[name] to be D/C'd  per MD order. Discussed with the patient and all questions fully answered.  VSS, Skin clean, dry and intact without evidence of skin break down, no evidence of skin tears noted.  IV catheter discontinued intact. Site without signs and symptoms of complications. Dressing and pressure applied.  An After Visit Summary was printed and given to the patient. Patient received prescription.  D/c education completed with patient/family including follow up instructions, medication list, d/c activities limitations if indicated, with other d/c instructions as indicated by MD - patient able to verbalize understanding, all questions fully answered.   Patient instructed to return to ED, call 911, or call MD for any changes in condition.   Patient denied wheelchair transport, and D/C home via private auto.

## 2016-04-10 ENCOUNTER — Other Ambulatory Visit: Payer: Self-pay | Admitting: Radiation Oncology

## 2016-04-10 ENCOUNTER — Telehealth: Payer: Self-pay | Admitting: Medical Oncology

## 2016-04-10 NOTE — Progress Notes (Signed)
Sister of Mr. Macnaughton called to verify date and time of CT simulation scheduled for 04/11/16.

## 2016-04-11 ENCOUNTER — Ambulatory Visit
Admission: RE | Admit: 2016-04-11 | Discharge: 2016-04-11 | Disposition: A | Discharge status: Home / Self Care | Payer: Medicaid Other | Source: Ambulatory Visit | Attending: Radiation Oncology | Admitting: Radiation Oncology

## 2016-04-11 DIAGNOSIS — C61 Malignant neoplasm of prostate: Secondary | ICD-10-CM | POA: Insufficient documentation

## 2016-04-11 DIAGNOSIS — Z510 Encounter for antineoplastic radiation therapy: Secondary | ICD-10-CM | POA: Diagnosis not present

## 2016-04-11 NOTE — Progress Notes (Signed)
  Radiation Oncology         [phone] ________________________________  Name: [name] MRN: [id number]  Date: 04/11/2016  DOB: 1961/03/20  SIMULATION AND TREATMENT PLANNING NOTE    ICD-9-CM ICD-10-CM   1. Malignant neoplasm of prostate s/p robotic prostatectomy 07/30/2015 185 C61     DIAGNOSIS:  55 yo man s/p post prostatectomy for pT3a, pN0 Gleason's score 4+3=7 adenocarcinoma with with focal left extracapsular extension, extensive right extracapsular extension, and focal right apex margin involvement and post-operative PSA of 0.13  NARRATIVE:  The patient was brought to the [facility].  Identity was confirmed.  All relevant records and images related to the planned course of therapy were reviewed.  The patient freely provided informed written consent to proceed with treatment after reviewing the details related to the planned course of therapy. The consent form was witnessed and verified by the simulation staff.  Then, the patient was set-up in a stable reproducible supine position for radiation therapy.  A vacuum lock pillow device was custom fabricated to position his legs in a reproducible immobilized position.  Then, I performed a urethrogram under sterile conditions to identify the prostatic apex.  CT images were obtained.  Surface markings were placed.  The CT images were loaded into the planning software.  Then the prostate target and avoidance structures including the rectum, bladder, bowel and hips were contoured.  Treatment planning then occurred.  The radiation prescription was entered and confirmed.  A total of 1 complex treatment devices were fabricated. I have requested : Intensity Modulated Radiotherapy (IMRT) is medically necessary for this case for the following reason:  Rectal sparing.Marland Kitchen  PLAN:  The patient will receive 68.4 Gy in 38 fractions.  ________________________________  Sheral Apley Tammi Klippel, M.D.

## 2016-04-18 DIAGNOSIS — Z510 Encounter for antineoplastic radiation therapy: Secondary | ICD-10-CM | POA: Diagnosis not present

## 2016-04-23 ENCOUNTER — Ambulatory Visit: Payer: Medicaid Other

## 2016-04-24 ENCOUNTER — Ambulatory Visit: Payer: Medicaid Other

## 2016-04-25 ENCOUNTER — Ambulatory Visit: Payer: Medicaid Other

## 2016-04-29 ENCOUNTER — Telehealth: Payer: Self-pay | Admitting: Medical Oncology

## 2016-04-29 ENCOUNTER — Ambulatory Visit: Payer: Medicaid Other | Admitting: Radiation Oncology

## 2016-04-29 NOTE — Progress Notes (Signed)
Lisa-sister called stating that Mr. Lamattina states his calendar start date is tomorrow. He will be here tomorrow at 3 pm. I notified the therapists on [place] 4.

## 2016-04-29 NOTE — Progress Notes (Signed)
Spoke with Lisa-sister to see if Mr. Dirienzo is coming for radiation treatment today. He was scheduled for 3 pm. [name] will contact him and call me back.

## 2016-04-30 ENCOUNTER — Ambulatory Visit: Payer: Medicaid Other

## 2016-04-30 ENCOUNTER — Ambulatory Visit
Admission: RE | Admit: 2016-04-30 | Discharge: 2016-04-30 | Disposition: A | Discharge status: Home / Self Care | Payer: Medicaid Other | Source: Ambulatory Visit | Attending: Radiation Oncology | Admitting: Radiation Oncology

## 2016-04-30 DIAGNOSIS — Z510 Encounter for antineoplastic radiation therapy: Secondary | ICD-10-CM | POA: Diagnosis not present

## 2016-05-01 ENCOUNTER — Ambulatory Visit
Admission: RE | Admit: 2016-05-01 | Discharge: 2016-05-01 | Disposition: A | Discharge status: Home / Self Care | Payer: Medicaid Other | Source: Ambulatory Visit | Attending: Radiation Oncology | Admitting: Radiation Oncology

## 2016-05-01 VITALS — BP 161/71 | HR 64 | Temp 98.4°F | Resp 18 | Wt 212.2 lb

## 2016-05-01 DIAGNOSIS — C61 Malignant neoplasm of prostate: Secondary | ICD-10-CM

## 2016-05-01 DIAGNOSIS — Z510 Encounter for antineoplastic radiation therapy: Secondary | ICD-10-CM | POA: Diagnosis not present

## 2016-05-01 DIAGNOSIS — K625 Hemorrhage of anus and rectum: Secondary | ICD-10-CM

## 2016-05-01 LAB — CBC WITH DIFFERENTIAL/PLATELET
BASO%: 0.6 % (ref 0.0–2.0)
Basophils Absolute: 0 10*3/uL (ref 0.0–0.1)
EOS%: 3.9 % (ref 0.0–7.0)
Eosinophils Absolute: 0.3 10*3/uL (ref 0.0–0.5)
HCT: 35.5 % — ABNORMAL LOW (ref 38.4–49.9)
HGB: 12 g/dL — ABNORMAL LOW (ref 13.0–17.1)
LYMPH%: 14.4 % (ref 14.0–49.0)
MCH: 31.2 pg (ref 27.2–33.4)
MCHC: 33.8 g/dL (ref 32.0–36.0)
MCV: 92.2 fL (ref 79.3–98.0)
MONO#: 0.7 10*3/uL (ref 0.1–0.9)
MONO%: 9.8 % (ref 0.0–14.0)
NEUT%: 71.3 % (ref 39.0–75.0)
NEUTROS ABS: 4.8 10*3/uL (ref 1.5–6.5)
Platelets: 149 10*3/uL (ref 140–400)
RBC: 3.85 10*6/uL — AB (ref 4.20–5.82)
RDW: 15.2 % — ABNORMAL HIGH (ref 11.0–14.6)
WBC: 6.7 10*3/uL (ref 4.0–10.3)
lymph#: 1 10*3/uL (ref 0.9–3.3)
nRBC: 0 % (ref 0–0)

## 2016-05-01 NOTE — Progress Notes (Signed)
H and H slightly lower than normal. Per Dr. Johny Shears order encouraged patient to follow up with PCP reference symptoms. Patient verbalized understanding.

## 2016-05-01 NOTE — Progress Notes (Signed)
  Radiation Oncology         [phone]   Name: [name] MRN: [id number]   Date: 05/01/2016  DOB: 1961-01-18     Weekly Radiation Therapy Management    ICD-9-CM ICD-10-CM   1. Rectal bleeding 569.3 K62.5 CBC with Differential  2. Malignant neoplasm of prostate s/p robotic prostatectomy 07/30/2015 185 C61     Current Dose: 3.6Gy  Planned Dose:  68.4 Gy  Narrative The patient presents for routine under treatment assessment. The patient reports some recent rectal bleeding, as well as weakness and fatigue.  The patient is without complaint. Set-up films were reviewed. The chart was checked.  Physical Findings  weight is 212 lb 3.2 oz (96.3 kg). His oral temperature is 98.4 F (36.9 C). His blood pressure is 161/71 (abnormal) and his pulse is 64. His respiration is 18 and oxygen saturation is 100%. . Weight essentially stable.  No significant changes. Patient is pale.  Impression The patient is tolerating radiation. Patient has a history of rectal bleeding and may be anemic.  Plan Continue treatment as planned. We will check his CBC.         Sheral Apley Tammi Klippel, M.D. This document serves as a record of services personally performed by Tyler Pita, MD. It was created on his behalf by Bethann Humble, a trained medical scribe. The creation of this record is based on the scribe's personal observations and the provider's statements to them. This document has been checked and approved by the attending provider.

## 2016-05-01 NOTE — Progress Notes (Signed)
Received patient in the clinic for PUT with Dr. Tammi Klippel. Patient reports he hasn't been feeling well for one week. Reports body aches and fatigue. He goes onto explain he had a headache following initial prostate radiation yesterday. Denies cough, chest congestion, sneezing or watery eyes. Reports he received his flu shot a month ago from his PCP, Dr. Vista Deck. Patient reports he is scheduled to see Dr. Watt Climes on the 18th for a colonscopy. Upon further investigation the colonscopy was ordered because the patient is passing large amounts of blood from his rectum even without bowel movements. Patient's color ashen. Per Dr. Johny Shears order sent patient for stat CBC. Patient understands to return to radiation oncology following lab draw for results.

## 2016-05-02 ENCOUNTER — Ambulatory Visit
Admission: RE | Admit: 2016-05-02 | Discharge: 2016-05-02 | Disposition: A | Discharge status: Home / Self Care | Payer: Medicaid Other | Source: Ambulatory Visit | Attending: Radiation Oncology | Admitting: Radiation Oncology

## 2016-05-02 ENCOUNTER — Ambulatory Visit: Admission: RE | Admit: 2016-05-02 | Payer: Medicaid Other | Source: Ambulatory Visit | Admitting: Radiation Oncology

## 2016-05-02 DIAGNOSIS — Z510 Encounter for antineoplastic radiation therapy: Secondary | ICD-10-CM | POA: Diagnosis not present

## 2016-05-04 ENCOUNTER — Other Ambulatory Visit: Payer: Self-pay | Admitting: Radiation Oncology

## 2016-05-04 DIAGNOSIS — C61 Malignant neoplasm of prostate: Secondary | ICD-10-CM

## 2016-05-04 DIAGNOSIS — R110 Nausea: Secondary | ICD-10-CM

## 2016-05-04 DIAGNOSIS — C6292 Malignant neoplasm of left testis, unspecified whether descended or undescended: Secondary | ICD-10-CM

## 2016-05-04 MED ORDER — ONDANSETRON 4 MG PO TBDP: 4.0000 mg | ORAL_TABLET | Freq: Three times a day (TID) | ORAL | 0 refills | Status: DC | PRN

## 2016-05-05 ENCOUNTER — Ambulatory Visit
Admission: RE | Admit: 2016-05-05 | Discharge: 2016-05-05 | Disposition: A | Discharge status: Home / Self Care | Payer: Medicaid Other | Source: Ambulatory Visit | Attending: Radiation Oncology | Admitting: Radiation Oncology

## 2016-05-05 ENCOUNTER — Inpatient Hospital Stay
Admission: RE | Admit: 2016-05-05 | Discharge: 2016-05-05 | Disposition: A | Discharge status: Home / Self Care | Payer: Self-pay | Source: Ambulatory Visit | Admitting: Radiation Oncology

## 2016-05-05 ENCOUNTER — Telehealth: Payer: Self-pay | Admitting: Radiation Oncology

## 2016-05-05 DIAGNOSIS — C61 Malignant neoplasm of prostate: Secondary | ICD-10-CM

## 2016-05-05 DIAGNOSIS — Z510 Encounter for antineoplastic radiation therapy: Secondary | ICD-10-CM | POA: Diagnosis not present

## 2016-05-05 DIAGNOSIS — S81801S Unspecified open wound, right lower leg, sequela: Secondary | ICD-10-CM

## 2016-05-05 DIAGNOSIS — M48061 Spinal stenosis, lumbar region without neurogenic claudication: Secondary | ICD-10-CM

## 2016-05-05 DIAGNOSIS — Z923 Personal history of irradiation: Secondary | ICD-10-CM

## 2016-05-05 DIAGNOSIS — L02419 Cutaneous abscess of limb, unspecified: Secondary | ICD-10-CM

## 2016-05-05 DIAGNOSIS — L03119 Cellulitis of unspecified part of limb: Secondary | ICD-10-CM

## 2016-05-05 DIAGNOSIS — K432 Incisional hernia without obstruction or gangrene: Secondary | ICD-10-CM

## 2016-05-05 MED ORDER — OXYCODONE HCL 5 MG PO TABS: 5.0000 mg | ORAL_TABLET | ORAL | 0 refills | Status: DC | PRN

## 2016-05-05 NOTE — Telephone Encounter (Signed)
-----   Message from Tyler Pita, MD sent at 05/04/2016  3:14 PM EST ----- Regarding: RE: Requesting antiemetic I'll e-prescribe Zofran today. Let him know I sent it in. ----- Message ----- From: Heywood Footman, RN Sent: 05/02/2016   5:04 PM To: Tyler Pita, MD, # Subject: Requesting antiemetic                          Dr. Tammi Klippel.  I received word from the therapist that the patient is requesting an antiemetic. It appears he has had Zofran in the past. He uses [facility] on [facility]. He is the prostate guy we saw yesterday with the rectal bleeding that was sent for lab work.  Sam

## 2016-05-05 NOTE — Telephone Encounter (Signed)
Phoned patient's cell to inform him Zofran has been escribed to his pharmacy. A male answered and reported the patient wasn't available presently but, he would give him the message. Requested Miranda, RT on [facility] also inform patient medication was escribed.

## 2016-05-05 NOTE — Progress Notes (Signed)
Patient reports body aches when trying to sleep which he attributes to radiation.  Discussed options.  Today, we refilled his pain meds (Oxy IR) with discussion about agreeing to a contract for opioids including tox screen if needed.

## 2016-05-05 NOTE — Addendum Note (Signed)
Encounter addended by: Heywood Footman, RN on: 05/05/2016  1:02 PM<BR>    Actions taken: Chief Complaint modified, Patient Education assessment filed

## 2016-05-05 NOTE — Telephone Encounter (Signed)
Confirmed with the pharmacist the medication was picked up by the patient today.

## 2016-05-05 NOTE — Progress Notes (Signed)
Received patient in the clinic following radiation treatment. Patient requesting to be evaluated by Dr. Tammi Klippel. Patient reports he continue to not feel well. Vitals stable. Patient afebrile. Patient reports over the last few months he has lost 20 pounds and has a poor appetite. Reports nausea and vomiting. Reports abdominal aches and pain. Patient eating mike&ikes and drinking cola while informing this RN of his present symptoms. Reports his urine is dark yellow and odorless. Denies dysuria or hematuria. Reports nocturia to frequent to count.  Reports he is no longer pass blood in his stool. Reports he is unable to sleep due to body aches.   BP (!) 139/59 (BP Location: Left Arm, Patient Position: Sitting, Cuff Size: Normal)   Pulse 61   Temp 98.6 F (37 C) (Oral)   Resp 18   Wt 214 lb (97.1 kg)   SpO2 100%   BMI 30.71 kg/m  Wt Readings from Last 3 Encounters:  05/05/16 214 lb (97.1 kg)  05/01/16 212 lb 3.2 oz (96.3 kg)  02/29/16 218 lb (98.9 kg)

## 2016-05-06 ENCOUNTER — Encounter: Payer: Self-pay | Admitting: Medical Oncology

## 2016-05-06 ENCOUNTER — Ambulatory Visit
Admission: RE | Admit: 2016-05-06 | Discharge: 2016-05-06 | Disposition: A | Discharge status: Home / Self Care | Payer: Medicaid Other | Source: Ambulatory Visit | Attending: Radiation Oncology | Admitting: Radiation Oncology

## 2016-05-06 DIAGNOSIS — Z510 Encounter for antineoplastic radiation therapy: Secondary | ICD-10-CM | POA: Diagnosis not present

## 2016-05-06 NOTE — Progress Notes (Signed)
[name] states he has not been feeling well for the past few days. He has been fatigued and nauseated. He picked up the Zofran yesterday and he states today he feels much better.

## 2016-05-07 ENCOUNTER — Ambulatory Visit
Admission: RE | Admit: 2016-05-07 | Discharge: 2016-05-07 | Disposition: A | Discharge status: Home / Self Care | Payer: Medicaid Other | Source: Ambulatory Visit | Attending: Radiation Oncology | Admitting: Radiation Oncology

## 2016-05-07 DIAGNOSIS — Z510 Encounter for antineoplastic radiation therapy: Secondary | ICD-10-CM | POA: Diagnosis not present

## 2016-05-08 ENCOUNTER — Ambulatory Visit
Admission: RE | Admit: 2016-05-08 | Discharge: 2016-05-08 | Disposition: A | Discharge status: Home / Self Care | Payer: Medicaid Other | Source: Ambulatory Visit | Attending: Radiation Oncology | Admitting: Radiation Oncology

## 2016-05-08 ENCOUNTER — Encounter: Payer: Self-pay | Admitting: Radiation Oncology

## 2016-05-08 VITALS — BP 125/77 | HR 67 | Resp 18 | Wt 213.0 lb

## 2016-05-08 DIAGNOSIS — E038 Other specified hypothyroidism: Secondary | ICD-10-CM

## 2016-05-08 DIAGNOSIS — C61 Malignant neoplasm of prostate: Secondary | ICD-10-CM

## 2016-05-08 DIAGNOSIS — Z510 Encounter for antineoplastic radiation therapy: Secondary | ICD-10-CM | POA: Diagnosis not present

## 2016-05-08 MED ORDER — RIFAXIMIN 550 MG PO TABS: 550.0000 mg | ORAL_TABLET | Freq: Two times a day (BID) | ORAL | 10 refills | Status: DC

## 2016-05-08 MED ORDER — LEVOTHYROXINE SODIUM 112 MCG PO TABS: 112.0000 ug | ORAL_TABLET | Freq: Every morning | ORAL | 10 refills | Status: DC

## 2016-05-08 MED FILL — LEVOTHYROXINE 112 MCG TAB: 112 | 30 days supply | Qty: 30 | Fill #0

## 2016-05-08 MED FILL — XIFAXAN 550 MG TABLET: 550 | 30 days supply | Qty: 60 | Fill #0

## 2016-05-08 NOTE — Progress Notes (Signed)
Weight and vitals stable. Denies generalized pain 7 on a scale of 0-10. Denies headache. Denies nausea or vomiting so long as he takes Zofran twice per day. Denies dysuria or hematuria. Reports nocturia x 3. Denies blood in his stool. Denies diarrhea or constipation. Describes a steady urine stream with dark yellow urine. Denies leakage or incontinence. Reports fatigue.   BP 125/77 (BP Location: Left Arm, Patient Position: Sitting, Cuff Size: Normal)   Pulse 67   Resp 18   Wt 213 lb (96.6 kg)   SpO2 100%   BMI 30.56 kg/m  Wt Readings from Last 3 Encounters:  05/08/16 213 lb (96.6 kg)  05/05/16 214 lb (97.1 kg)  05/01/16 212 lb 3.2 oz (96.3 kg)

## 2016-05-08 NOTE — Progress Notes (Signed)
  Radiation Oncology         [phone]   Name: [name] MRN: [id number]   Date: 05/08/2016  DOB: [birth year]     Weekly Radiation Therapy Management    ICD-9-CM ICD-10-CM   1. Other specified hypothyroidism 244.8 E03.8   2. Malignant neoplasm of prostate s/p robotic prostatectomy 07/30/2015 185 C61     Current Dose: 12.6Gy  Planned Dose:  68.4 Gy  Narrative The patient presents for routine under treatment assessment.  Weight and vitals stable. Patient report 7/10 generalized pain. Denies headaches, nausea/vomiting, dysuria, or hematuria. He reports nocturia x 3. Denies diarrhea, constipation, or blood in stool. He describes a steady urine stream with dark yellow urine. Denies leakage or incontinence. Reports fatigue.  Set-up films were reviewed. The chart was checked.  Physical Findings  weight is 213 lb (96.6 kg). His blood pressure is 125/77 and his pulse is 67. His respiration is 18 and oxygen saturation is 100%. . Weight essentially stable.  No significant changes.   Impression The patient is tolerating radiation.  Plan Continue treatment as planned.      Sheral Apley Tammi Klippel, M.D. This document serves as a record of services personally performed by Tyler Pita, MD. It was created on his behalf by Bethann Humble, a trained medical scribe. The creation of this record is based on the scribe's personal observations and the provider's statements to them. This document has been checked and approved by the attending provider.

## 2016-05-09 ENCOUNTER — Inpatient Hospital Stay (HOSPITAL_COMMUNITY)
Admission: EM | Admit: 2016-05-09 | Discharge: 2016-05-16 | DRG: 958 | Disposition: A | Discharge status: Rehabilitation Facility | Payer: Medicaid Other | Attending: General Surgery | Admitting: General Surgery

## 2016-05-09 ENCOUNTER — Ambulatory Visit
Admission: RE | Admit: 2016-05-09 | Discharge: 2016-05-09 | Disposition: A | Discharge status: Home / Self Care | Payer: Medicaid Other | Source: Ambulatory Visit | Attending: Radiation Oncology | Admitting: Radiation Oncology

## 2016-05-09 ENCOUNTER — Emergency Department (HOSPITAL_COMMUNITY): Payer: Medicaid Other

## 2016-05-09 DIAGNOSIS — S22072A Unstable burst fracture of T9-T10 vertebra, initial encounter for closed fracture: Secondary | ICD-10-CM

## 2016-05-09 DIAGNOSIS — M5137 Other intervertebral disc degeneration, lumbosacral region: Secondary | ICD-10-CM

## 2016-05-09 DIAGNOSIS — Z419 Encounter for procedure for purposes other than remedying health state, unspecified: Secondary | ICD-10-CM

## 2016-05-09 DIAGNOSIS — S30811A Abrasion of abdominal wall, initial encounter: Secondary | ICD-10-CM | POA: Diagnosis present

## 2016-05-09 DIAGNOSIS — Y92410 Unspecified street and highway as the place of occurrence of the external cause: Secondary | ICD-10-CM

## 2016-05-09 DIAGNOSIS — G8918 Other acute postprocedural pain: Secondary | ICD-10-CM

## 2016-05-09 DIAGNOSIS — R319 Hematuria, unspecified: Secondary | ICD-10-CM | POA: Diagnosis not present

## 2016-05-09 DIAGNOSIS — M5030 Other cervical disc degeneration, unspecified cervical region: Secondary | ICD-10-CM

## 2016-05-09 DIAGNOSIS — S80811A Abrasion, right lower leg, initial encounter: Secondary | ICD-10-CM | POA: Diagnosis present

## 2016-05-09 DIAGNOSIS — K7460 Unspecified cirrhosis of liver: Secondary | ICD-10-CM

## 2016-05-09 DIAGNOSIS — S0101XA Laceration without foreign body of scalp, initial encounter: Secondary | ICD-10-CM | POA: Diagnosis present

## 2016-05-09 DIAGNOSIS — F17210 Nicotine dependence, cigarettes, uncomplicated: Secondary | ICD-10-CM | POA: Diagnosis present

## 2016-05-09 DIAGNOSIS — Z811 Family history of alcohol abuse and dependence: Secondary | ICD-10-CM

## 2016-05-09 DIAGNOSIS — R0682 Tachypnea, not elsewhere classified: Secondary | ICD-10-CM

## 2016-05-09 DIAGNOSIS — Z981 Arthrodesis status: Secondary | ICD-10-CM

## 2016-05-09 DIAGNOSIS — Z8547 Personal history of malignant neoplasm of testis: Secondary | ICD-10-CM

## 2016-05-09 DIAGNOSIS — S22081A Stable burst fracture of T11-T12 vertebra, initial encounter for closed fracture: Principal | ICD-10-CM

## 2016-05-09 DIAGNOSIS — M542 Cervicalgia: Secondary | ICD-10-CM

## 2016-05-09 DIAGNOSIS — R339 Retention of urine, unspecified: Secondary | ICD-10-CM

## 2016-05-09 DIAGNOSIS — S30812A Abrasion of penis, initial encounter: Secondary | ICD-10-CM | POA: Diagnosis present

## 2016-05-09 DIAGNOSIS — D62 Acute posthemorrhagic anemia: Secondary | ICD-10-CM

## 2016-05-09 DIAGNOSIS — C61 Malignant neoplasm of prostate: Secondary | ICD-10-CM

## 2016-05-09 DIAGNOSIS — Z923 Personal history of irradiation: Secondary | ICD-10-CM

## 2016-05-09 DIAGNOSIS — G629 Polyneuropathy, unspecified: Secondary | ICD-10-CM | POA: Diagnosis present

## 2016-05-09 DIAGNOSIS — S40211A Abrasion of right shoulder, initial encounter: Secondary | ICD-10-CM | POA: Diagnosis present

## 2016-05-09 DIAGNOSIS — S24154A Other incomplete lesion at T11-T12 level of thoracic spinal cord, initial encounter: Secondary | ICD-10-CM | POA: Diagnosis present

## 2016-05-09 DIAGNOSIS — I878 Other specified disorders of veins: Secondary | ICD-10-CM

## 2016-05-09 DIAGNOSIS — Z510 Encounter for antineoplastic radiation therapy: Secondary | ICD-10-CM | POA: Diagnosis not present

## 2016-05-09 DIAGNOSIS — N320 Bladder-neck obstruction: Secondary | ICD-10-CM | POA: Diagnosis not present

## 2016-05-09 DIAGNOSIS — D696 Thrombocytopenia, unspecified: Secondary | ICD-10-CM | POA: Diagnosis present

## 2016-05-09 DIAGNOSIS — I313 Pericardial effusion (noninflammatory): Secondary | ICD-10-CM | POA: Diagnosis present

## 2016-05-09 DIAGNOSIS — T1501XA Foreign body in cornea, right eye, initial encounter: Secondary | ICD-10-CM | POA: Diagnosis present

## 2016-05-09 DIAGNOSIS — Z833 Family history of diabetes mellitus: Secondary | ICD-10-CM

## 2016-05-09 DIAGNOSIS — G894 Chronic pain syndrome: Secondary | ICD-10-CM

## 2016-05-09 DIAGNOSIS — S27322A Contusion of lung, bilateral, initial encounter: Secondary | ICD-10-CM | POA: Diagnosis present

## 2016-05-09 DIAGNOSIS — M51379 Other intervertebral disc degeneration, lumbosacral region without mention of lumbar back pain or lower extremity pain: Secondary | ICD-10-CM

## 2016-05-09 DIAGNOSIS — S50811A Abrasion of right forearm, initial encounter: Secondary | ICD-10-CM | POA: Diagnosis present

## 2016-05-09 DIAGNOSIS — K5900 Constipation, unspecified: Secondary | ICD-10-CM | POA: Diagnosis not present

## 2016-05-09 DIAGNOSIS — S60511A Abrasion of right hand, initial encounter: Secondary | ICD-10-CM | POA: Diagnosis present

## 2016-05-09 HISTORY — DX: Malignant neoplasm of left testis, unspecified whether descended or undescended: C62.92

## 2016-05-09 HISTORY — DX: Malignant neoplasm of prostate: C61

## 2016-05-09 HISTORY — DX: Hepatic encephalopathy: K76.82

## 2016-05-09 HISTORY — DX: Hepatic failure, unspecified without coma: K72.90

## 2016-05-09 HISTORY — DX: Personal history of irradiation: Z92.3

## 2016-05-09 HISTORY — DX: Unspecified hearing loss, unspecified ear: H91.90

## 2016-05-09 HISTORY — DX: Localized edema: R60.0

## 2016-05-09 MED ORDER — TETRACAINE HCL 0.5 % OP SOLN
2.0000 [drp] | Freq: Once | OPHTHALMIC | Status: AC
Start: 2016-05-10 — End: 2016-05-10
  Administered 2016-05-10: 2 [drp] via OPHTHALMIC
  Filled 2016-05-09: qty 2

## 2016-05-09 MED ORDER — FENTANYL CITRATE (PF) 100 MCG/2ML IJ SOLN
INTRAMUSCULAR | Status: AC
  Filled 2016-05-09: qty 2
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
## 2016-05-10 ENCOUNTER — Inpatient Hospital Stay (HOSPITAL_COMMUNITY): Payer: Medicaid Other

## 2016-05-10 ENCOUNTER — Inpatient Hospital Stay (HOSPITAL_COMMUNITY): Payer: Medicaid Other | Admitting: Certified Registered Nurse Anesthetist

## 2016-05-10 ENCOUNTER — Encounter (HOSPITAL_COMMUNITY): Payer: Self-pay | Admitting: *Deleted

## 2016-05-10 ENCOUNTER — Emergency Department (HOSPITAL_COMMUNITY): Payer: Medicaid Other

## 2016-05-10 ENCOUNTER — Encounter (HOSPITAL_COMMUNITY): Admission: EM | Disposition: A | Discharge status: Rehabilitation Facility | Payer: Self-pay | Source: Home / Self Care

## 2016-05-10 DIAGNOSIS — S0101XA Laceration without foreign body of scalp, initial encounter: Secondary | ICD-10-CM | POA: Diagnosis not present

## 2016-05-10 DIAGNOSIS — Z8547 Personal history of malignant neoplasm of testis: Secondary | ICD-10-CM | POA: Diagnosis not present

## 2016-05-10 DIAGNOSIS — Z811 Family history of alcohol abuse and dependence: Secondary | ICD-10-CM | POA: Diagnosis not present

## 2016-05-10 DIAGNOSIS — D62 Acute posthemorrhagic anemia: Secondary | ICD-10-CM | POA: Diagnosis not present

## 2016-05-10 DIAGNOSIS — M79609 Pain in unspecified limb: Secondary | ICD-10-CM | POA: Diagnosis not present

## 2016-05-10 DIAGNOSIS — N320 Bladder-neck obstruction: Secondary | ICD-10-CM | POA: Diagnosis not present

## 2016-05-10 DIAGNOSIS — S24154A Other incomplete lesion at T11-T12 level of thoracic spinal cord, initial encounter: Secondary | ICD-10-CM | POA: Diagnosis not present

## 2016-05-10 DIAGNOSIS — G8220 Paraplegia, unspecified: Secondary | ICD-10-CM | POA: Diagnosis not present

## 2016-05-10 DIAGNOSIS — T1501XA Foreign body in cornea, right eye, initial encounter: Secondary | ICD-10-CM | POA: Diagnosis present

## 2016-05-10 DIAGNOSIS — M7989 Other specified soft tissue disorders: Secondary | ICD-10-CM | POA: Diagnosis not present

## 2016-05-10 DIAGNOSIS — G629 Polyneuropathy, unspecified: Secondary | ICD-10-CM | POA: Diagnosis not present

## 2016-05-10 DIAGNOSIS — S30812A Abrasion of penis, initial encounter: Secondary | ICD-10-CM | POA: Diagnosis not present

## 2016-05-10 DIAGNOSIS — Z981 Arthrodesis status: Secondary | ICD-10-CM | POA: Diagnosis not present

## 2016-05-10 DIAGNOSIS — C61 Malignant neoplasm of prostate: Secondary | ICD-10-CM | POA: Diagnosis not present

## 2016-05-10 DIAGNOSIS — F419 Anxiety disorder, unspecified: Secondary | ICD-10-CM | POA: Diagnosis not present

## 2016-05-10 DIAGNOSIS — S30811A Abrasion of abdominal wall, initial encounter: Secondary | ICD-10-CM | POA: Diagnosis not present

## 2016-05-10 DIAGNOSIS — S27322A Contusion of lung, bilateral, initial encounter: Secondary | ICD-10-CM | POA: Diagnosis not present

## 2016-05-10 DIAGNOSIS — R339 Retention of urine, unspecified: Secondary | ICD-10-CM | POA: Diagnosis not present

## 2016-05-10 DIAGNOSIS — M546 Pain in thoracic spine: Secondary | ICD-10-CM | POA: Diagnosis present

## 2016-05-10 DIAGNOSIS — S80811A Abrasion, right lower leg, initial encounter: Secondary | ICD-10-CM | POA: Diagnosis present

## 2016-05-10 DIAGNOSIS — F329 Major depressive disorder, single episode, unspecified: Secondary | ICD-10-CM | POA: Diagnosis not present

## 2016-05-10 DIAGNOSIS — S60511A Abrasion of right hand, initial encounter: Secondary | ICD-10-CM | POA: Diagnosis present

## 2016-05-10 DIAGNOSIS — D696 Thrombocytopenia, unspecified: Secondary | ICD-10-CM | POA: Diagnosis not present

## 2016-05-10 DIAGNOSIS — K7460 Unspecified cirrhosis of liver: Secondary | ICD-10-CM | POA: Diagnosis present

## 2016-05-10 DIAGNOSIS — S22081A Stable burst fracture of T11-T12 vertebra, initial encounter for closed fracture: Secondary | ICD-10-CM | POA: Diagnosis not present

## 2016-05-10 DIAGNOSIS — Z833 Family history of diabetes mellitus: Secondary | ICD-10-CM | POA: Diagnosis not present

## 2016-05-10 DIAGNOSIS — S40211A Abrasion of right shoulder, initial encounter: Secondary | ICD-10-CM | POA: Diagnosis not present

## 2016-05-10 DIAGNOSIS — Y92410 Unspecified street and highway as the place of occurrence of the external cause: Secondary | ICD-10-CM | POA: Diagnosis not present

## 2016-05-10 DIAGNOSIS — K5900 Constipation, unspecified: Secondary | ICD-10-CM | POA: Diagnosis not present

## 2016-05-10 DIAGNOSIS — S22072D Unstable burst fracture of T9-T10 vertebra, subsequent encounter for fracture with routine healing: Secondary | ICD-10-CM | POA: Diagnosis not present

## 2016-05-10 DIAGNOSIS — S50811A Abrasion of right forearm, initial encounter: Secondary | ICD-10-CM | POA: Diagnosis present

## 2016-05-10 DIAGNOSIS — M542 Cervicalgia: Secondary | ICD-10-CM | POA: Diagnosis not present

## 2016-05-10 DIAGNOSIS — I313 Pericardial effusion (noninflammatory): Secondary | ICD-10-CM | POA: Diagnosis not present

## 2016-05-10 HISTORY — PX: POSTERIOR LUMBAR FUSION 4 LEVEL: SHX6037

## 2016-05-10 LAB — COMPREHENSIVE METABOLIC PANEL
ALBUMIN: 2.4 g/dL — AB (ref 3.5–5.0)
ALBUMIN: 2.9 g/dL — AB (ref 3.5–5.0)
ALK PHOS: 229 U/L — AB (ref 38–126)
ALT: 105 U/L — AB (ref 17–63)
ALT: 90 U/L — ABNORMAL HIGH (ref 17–63)
ANION GAP: 13 (ref 5–15)
ANION GAP: 8 (ref 5–15)
AST: 124 U/L — AB (ref 15–41)
AST: 152 U/L — ABNORMAL HIGH (ref 15–41)
Alkaline Phosphatase: 267 U/L — ABNORMAL HIGH (ref 38–126)
BILIRUBIN TOTAL: 0.6 mg/dL (ref 0.3–1.2)
BUN: 8 mg/dL (ref 6–20)
BUN: 9 mg/dL (ref 6–20)
CALCIUM: 7.4 mg/dL — AB (ref 8.9–10.3)
CHLORIDE: 105 mmol/L (ref 101–111)
CO2: 16 mmol/L — AB (ref 22–32)
CO2: 18 mmol/L — ABNORMAL LOW (ref 22–32)
Calcium: 8.1 mg/dL — ABNORMAL LOW (ref 8.9–10.3)
Chloride: 109 mmol/L (ref 101–111)
Creatinine, Ser: 1.12 mg/dL (ref 0.61–1.24)
Creatinine, Ser: 1.17 mg/dL (ref 0.61–1.24)
GFR calc Af Amer: >60 mL/min (ref >60)
GFR calc non Af Amer: >60 mL/min (ref >60)
GFR calc non Af Amer: >60 mL/min (ref >60)
GLUCOSE: 125 mg/dL — AB (ref 65–99)
Glucose, Bld: 129 mg/dL — ABNORMAL HIGH (ref 65–99)
Potassium: 3.4 mmol/L — ABNORMAL LOW (ref 3.5–5.1)
Potassium: 4.1 mmol/L (ref 3.5–5.1)
SODIUM: 134 mmol/L — AB (ref 135–145)
Sodium: 135 mmol/L (ref 135–145)
TOTAL PROTEIN: 5.1 g/dL — AB (ref 6.5–8.1)
Total Bilirubin: 0.6 mg/dL (ref 0.3–1.2)
Total Protein: 5.7 g/dL — ABNORMAL LOW (ref 6.5–8.1)

## 2016-05-10 LAB — CBC
HCT: 25.4 % — ABNORMAL LOW (ref 39.0–52.0)
HCT: 31.7 % — ABNORMAL LOW (ref 39.0–52.0)
HEMOGLOBIN: 10.3 g/dL — AB (ref 13.0–17.0)
Hemoglobin: 8.4 g/dL — ABNORMAL LOW (ref 13.0–17.0)
MCH: 30.4 pg (ref 26.0–34.0)
MCH: 31.2 pg (ref 26.0–34.0)
MCHC: 32.5 g/dL (ref 30.0–36.0)
MCHC: 33.1 g/dL (ref 30.0–36.0)
MCV: 93.5 fL (ref 78.0–100.0)
MCV: 94.4 fL (ref 78.0–100.0)
Platelets: 139 10*3/uL — ABNORMAL LOW (ref 150–400)
Platelets: 203 10*3/uL (ref 150–400)
RBC: 2.69 MIL/uL — ABNORMAL LOW (ref 4.22–5.81)
RBC: 3.39 MIL/uL — AB (ref 4.22–5.81)
RDW: 15.5 % (ref 11.5–15.5)
RDW: 15.9 % — ABNORMAL HIGH (ref 11.5–15.5)
WBC: 10.3 10*3/uL (ref 4.0–10.5)
WBC: 11 10*3/uL — ABNORMAL HIGH (ref 4.0–10.5)

## 2016-05-10 LAB — I-STAT CHEM 8, ED
BUN: 10 mg/dL (ref 6–20)
Calcium, Ion: 1.02 mmol/L — ABNORMAL LOW (ref 1.15–1.40)
Chloride: 103 mmol/L (ref 101–111)
Creatinine, Ser: 1.3 mg/dL — ABNORMAL HIGH (ref 0.61–1.24)
Glucose, Bld: 121 mg/dL — ABNORMAL HIGH (ref 65–99)
HCT: 30 % — ABNORMAL LOW (ref 39.0–52.0)
Hemoglobin: 10.2 g/dL — ABNORMAL LOW (ref 13.0–17.0)
Potassium: 3.8 mmol/L (ref 3.5–5.1)
Sodium: 135 mmol/L (ref 135–145)
TCO2: 19 mmol/L (ref 0–100)

## 2016-05-10 LAB — ETHANOL: Alcohol, Ethyl (B): 204 mg/dL — ABNORMAL HIGH (ref <5)

## 2016-05-10 LAB — PREPARE FRESH FROZEN PLASMA
Unit division: 0
Unit division: 0

## 2016-05-10 LAB — PREPARE RBC (CROSSMATCH)

## 2016-05-10 LAB — I-STAT CG4 LACTIC ACID, ED: Lactic Acid, Venous: 4.18 mmol/L (ref 0.5–1.9)

## 2016-05-10 LAB — PROTIME-INR
INR: 0.98
PROTHROMBIN TIME: 13 s (ref 11.4–15.2)

## 2016-05-10 LAB — MRSA PCR SCREENING: MRSA BY PCR: NEGATIVE

## 2016-05-10 SURGERY — POSTERIOR LUMBAR FUSION 4 LEVEL
Anesthesia: General | Site: Spine Thoracic

## 2016-05-10 MED ORDER — VANCOMYCIN HCL 1000 MG IV SOLR
INTRAVENOUS | Status: AC
  Filled 2016-05-10: qty 1000

## 2016-05-10 MED ORDER — LORAZEPAM 1 MG PO TABS: 1.0000 mg | ORAL_TABLET | Freq: Four times a day (QID) | ORAL | Status: AC | PRN

## 2016-05-10 MED ORDER — VITAMIN B-1 100 MG PO TABS
100.0000 mg | ORAL_TABLET | Freq: Every day | ORAL | Status: DC
  Administered 2016-05-11 – 2016-05-16 (×6): 100 mg via ORAL
  Filled 2016-05-10 (×6): qty 1

## 2016-05-10 MED ORDER — IOPAMIDOL (ISOVUE-300) INJECTION 61%
100.0000 mL | Freq: Once | INTRAVENOUS | Status: AC | PRN
Start: 2016-05-10 — End: 2016-05-10
  Administered 2016-05-10: 100 mL via INTRAVENOUS

## 2016-05-10 MED ORDER — FLUORESCEIN SODIUM 0.6 MG OP STRP: 1.0000 | ORAL_STRIP | Freq: Once | OPHTHALMIC | Status: DC

## 2016-05-10 MED ORDER — SODIUM CHLORIDE 0.9% FLUSH
3.0000 mL | Freq: Two times a day (BID) | INTRAVENOUS | Status: DC
  Administered 2016-05-10 – 2016-05-13 (×5): 3 mL via INTRAVENOUS

## 2016-05-10 MED ORDER — ONDANSETRON HCL 4 MG PO TABS
4.0000 mg | ORAL_TABLET | Freq: Four times a day (QID) | ORAL | Status: DC | PRN
Start: 2016-05-10 — End: 2016-05-11

## 2016-05-10 MED ORDER — BACITRACIN 50000 UNITS IM SOLR
INTRAMUSCULAR | Status: DC | PRN
  Administered 2016-05-10: 13:00:00

## 2016-05-10 MED ORDER — ARTIFICIAL TEARS OP OINT
TOPICAL_OINTMENT | OPHTHALMIC | Status: DC | PRN
  Administered 2016-05-10: 1 via OPHTHALMIC

## 2016-05-10 MED ORDER — PROPOFOL 10 MG/ML IV BOLUS
INTRAVENOUS | Status: DC | PRN
  Administered 2016-05-10: 130 mg via INTRAVENOUS

## 2016-05-10 MED ORDER — FENTANYL CITRATE (PF) 100 MCG/2ML IJ SOLN
INTRAMUSCULAR | Status: AC
  Filled 2016-05-10: qty 4

## 2016-05-10 MED ORDER — THIAMINE HCL 100 MG/ML IJ SOLN
100.0000 mg | Freq: Every day | INTRAMUSCULAR | Status: DC
  Administered 2016-05-10: 100 mg via INTRAVENOUS
  Filled 2016-05-10: qty 2

## 2016-05-10 MED ORDER — CEFAZOLIN SODIUM-DEXTROSE 2-4 GM/100ML-% IV SOLN
INTRAVENOUS | Status: AC
  Filled 2016-05-10: qty 100

## 2016-05-10 MED ORDER — DIAZEPAM 5 MG PO TABS
5.0000 mg | ORAL_TABLET | Freq: Four times a day (QID) | ORAL | Status: DC | PRN
  Administered 2016-05-11 – 2016-05-13 (×3): 10 mg via ORAL
  Filled 2016-05-10 (×3): qty 2

## 2016-05-10 MED ORDER — POTASSIUM CHLORIDE IN NACL 20-0.9 MEQ/L-% IV SOLN
INTRAVENOUS | Status: DC
  Administered 2016-05-10 (×2): via INTRAVENOUS
  Administered 2016-05-11: 1000 mL via INTRAVENOUS
  Administered 2016-05-11 (×2): via INTRAVENOUS
  Filled 2016-05-10 (×5): qty 1000

## 2016-05-10 MED ORDER — CEFAZOLIN SODIUM-DEXTROSE 2-3 GM-% IV SOLR
INTRAVENOUS | Status: DC | PRN
  Administered 2016-05-10: 2 g via INTRAVENOUS

## 2016-05-10 MED ORDER — SODIUM CHLORIDE 0.9 % IV SOLN: 250.0000 mL | INTRAVENOUS | Status: DC

## 2016-05-10 MED ORDER — SUGAMMADEX SODIUM 200 MG/2ML IV SOLN
INTRAVENOUS | Status: DC | PRN
  Administered 2016-05-10: 200 mg via INTRAVENOUS

## 2016-05-10 MED ORDER — FENTANYL CITRATE (PF) 100 MCG/2ML IJ SOLN
25.0000 ug | INTRAMUSCULAR | Status: DC | PRN
  Administered 2016-05-10: 50 ug via INTRAVENOUS

## 2016-05-10 MED ORDER — ACETAMINOPHEN 650 MG RE SUPP: 650.0000 mg | RECTAL | Status: DC | PRN

## 2016-05-10 MED ORDER — THROMBIN 5000 UNITS EX SOLR
OROMUCOSAL | Status: DC | PRN
  Administered 2016-05-10 (×2): via TOPICAL

## 2016-05-10 MED ORDER — ONDANSETRON HCL 4 MG/2ML IJ SOLN: 4.0000 mg | Freq: Once | INTRAMUSCULAR | Status: DC | PRN

## 2016-05-10 MED ORDER — SODIUM CHLORIDE 0.9% FLUSH: 3.0000 mL | INTRAVENOUS | Status: DC | PRN

## 2016-05-10 MED ORDER — HYDROMORPHONE HCL 2 MG/ML IJ SOLN
1.0000 mg | Freq: Once | INTRAMUSCULAR | Status: AC
  Administered 2016-05-10: 1 mg via INTRAVENOUS
  Filled 2016-05-10: qty 1

## 2016-05-10 MED ORDER — VANCOMYCIN HCL 1000 MG IV SOLR
INTRAVENOUS | Status: DC | PRN
  Administered 2016-05-10: 1000 mg via TOPICAL

## 2016-05-10 MED ORDER — PHENOL 1.4 % MT LIQD
1.0000 | OROMUCOSAL | Status: DC | PRN
Start: 2016-05-10 — End: 2016-05-16

## 2016-05-10 MED ORDER — PROPOFOL 10 MG/ML IV BOLUS
INTRAVENOUS | Status: AC
  Filled 2016-05-10: qty 20

## 2016-05-10 MED ORDER — FOLIC ACID 1 MG PO TABS
1.0000 mg | ORAL_TABLET | Freq: Every day | ORAL | Status: DC
  Administered 2016-05-11 – 2016-05-16 (×6): 1 mg via ORAL
  Filled 2016-05-10 (×6): qty 1

## 2016-05-10 MED ORDER — PANTOPRAZOLE SODIUM 40 MG PO TBEC: 40.0000 mg | DELAYED_RELEASE_TABLET | Freq: Every day | ORAL | Status: DC

## 2016-05-10 MED ORDER — SUCCINYLCHOLINE CHLORIDE 20 MG/ML IJ SOLN
INTRAMUSCULAR | Status: DC | PRN
  Administered 2016-05-10: 160 mg via INTRAVENOUS

## 2016-05-10 MED ORDER — MIDAZOLAM HCL 5 MG/5ML IJ SOLN
INTRAMUSCULAR | Status: DC | PRN
  Administered 2016-05-10: 1 mg via INTRAVENOUS

## 2016-05-10 MED ORDER — DEXTROSE 5 % IV SOLN
INTRAVENOUS | Status: DC | PRN
  Administered 2016-05-10: 50 ug/min via INTRAVENOUS

## 2016-05-10 MED ORDER — PANTOPRAZOLE SODIUM 40 MG IV SOLR
40.0000 mg | Freq: Every day | INTRAVENOUS | Status: DC
  Administered 2016-05-10 – 2016-05-11 (×2): 40 mg via INTRAVENOUS
  Filled 2016-05-10 (×2): qty 40

## 2016-05-10 MED ORDER — ONDANSETRON HCL 4 MG/2ML IJ SOLN: 4.0000 mg | Freq: Four times a day (QID) | INTRAMUSCULAR | Status: DC | PRN

## 2016-05-10 MED ORDER — PHENYLEPHRINE HCL 10 MG/ML IJ SOLN
INTRAMUSCULAR | Status: DC | PRN
  Administered 2016-05-10 (×5): 80 ug via INTRAVENOUS

## 2016-05-10 MED ORDER — BUPIVACAINE HCL (PF) 0.25 % IJ SOLN
INTRAMUSCULAR | Status: AC
  Filled 2016-05-10: qty 30

## 2016-05-10 MED ORDER — LIDOCAINE HCL (CARDIAC) 20 MG/ML IV SOLN
INTRAVENOUS | Status: DC | PRN
  Administered 2016-05-10: 60 mg via INTRAVENOUS

## 2016-05-10 MED ORDER — ACETAMINOPHEN 325 MG PO TABS
650.0000 mg | ORAL_TABLET | ORAL | Status: DC | PRN
  Administered 2016-05-16 (×2): 650 mg via ORAL
  Filled 2016-05-10 (×2): qty 2

## 2016-05-10 MED ORDER — BUPIVACAINE HCL (PF) 0.25 % IJ SOLN
INTRAMUSCULAR | Status: DC | PRN
  Administered 2016-05-10: 30 mL

## 2016-05-10 MED ORDER — OXYCODONE-ACETAMINOPHEN 5-325 MG PO TABS: 1.0000 | ORAL_TABLET | ORAL | Status: DC | PRN

## 2016-05-10 MED ORDER — HYDROMORPHONE HCL 1 MG/ML IJ SOLN: 0.5000 mg | INTRAMUSCULAR | Status: DC | PRN

## 2016-05-10 MED ORDER — FENTANYL CITRATE (PF) 100 MCG/2ML IJ SOLN: 25.0000 ug | INTRAMUSCULAR | Status: DC | PRN

## 2016-05-10 MED ORDER — THROMBIN 20000 UNITS EX SOLR
CUTANEOUS | Status: AC
  Filled 2016-05-10: qty 20000

## 2016-05-10 MED ORDER — ENOXAPARIN SODIUM 40 MG/0.4ML ~~LOC~~ SOLN
40.0000 mg | SUBCUTANEOUS | Status: DC
  Administered 2016-05-11: 40 mg via SUBCUTANEOUS
  Filled 2016-05-10: qty 0.4

## 2016-05-10 MED ORDER — FENTANYL CITRATE (PF) 100 MCG/2ML IJ SOLN
INTRAMUSCULAR | Status: DC | PRN
  Administered 2016-05-10: 50 ug via INTRAVENOUS
  Administered 2016-05-10: 100 ug via INTRAVENOUS
  Administered 2016-05-10 (×3): 50 ug via INTRAVENOUS

## 2016-05-10 MED ORDER — FENTANYL CITRATE (PF) 100 MCG/2ML IJ SOLN
INTRAMUSCULAR | Status: AC
  Filled 2016-05-10: qty 2

## 2016-05-10 MED ORDER — LORAZEPAM 2 MG/ML IJ SOLN
1.0000 mg | Freq: Four times a day (QID) | INTRAMUSCULAR | Status: AC | PRN
  Administered 2016-05-10: 1 mg via INTRAVENOUS
  Filled 2016-05-10: qty 1

## 2016-05-10 MED ORDER — OXYCODONE HCL 5 MG/5ML PO SOLN: 5.0000 mg | Freq: Once | ORAL | Status: DC | PRN

## 2016-05-10 MED ORDER — HYDROCODONE-ACETAMINOPHEN 5-325 MG PO TABS
1.0000 | ORAL_TABLET | ORAL | Status: DC | PRN
  Administered 2016-05-11: 2 via ORAL
  Filled 2016-05-10: qty 2

## 2016-05-10 MED ORDER — FENTANYL CITRATE (PF) 100 MCG/2ML IJ SOLN
50.0000 ug | Freq: Once | INTRAMUSCULAR | Status: AC
  Administered 2016-05-10: 50 ug via INTRAVENOUS
  Filled 2016-05-10: qty 2

## 2016-05-10 MED ORDER — DEXAMETHASONE SODIUM PHOSPHATE 4 MG/ML IJ SOLN
INTRAMUSCULAR | Status: DC | PRN
  Administered 2016-05-10: 10 mg via INTRAVENOUS

## 2016-05-10 MED ORDER — MORPHINE SULFATE (PF) 2 MG/ML IV SOLN
1.0000 mg | INTRAVENOUS | Status: DC | PRN
  Administered 2016-05-10 – 2016-05-11 (×13): 4 mg via INTRAVENOUS
  Filled 2016-05-10 (×6): qty 2
  Filled 2016-05-10: qty 1
  Filled 2016-05-10 (×6): qty 2
  Filled 2016-05-10: qty 1

## 2016-05-10 MED ORDER — HYDROMORPHONE HCL 1 MG/ML IJ SOLN
1.0000 mg | INTRAMUSCULAR | Status: DC | PRN
  Administered 2016-05-10 – 2016-05-11 (×4): 1 mg via INTRAVENOUS
  Filled 2016-05-10 (×4): qty 1

## 2016-05-10 MED ORDER — MIDAZOLAM HCL 2 MG/2ML IJ SOLN
INTRAMUSCULAR | Status: AC
  Filled 2016-05-10: qty 2

## 2016-05-10 MED ORDER — ONDANSETRON HCL 4 MG/2ML IJ SOLN
INTRAMUSCULAR | Status: DC | PRN
  Administered 2016-05-10: 4 mg via INTRAVENOUS

## 2016-05-10 MED ORDER — HEMOSTATIC AGENTS (NO CHARGE) OPTIME
TOPICAL | Status: DC | PRN
  Administered 2016-05-10: 1 via TOPICAL

## 2016-05-10 MED ORDER — OXYCODONE HCL 5 MG PO TABS: 5.0000 mg | ORAL_TABLET | Freq: Once | ORAL | Status: DC | PRN

## 2016-05-10 MED ORDER — CEFAZOLIN IN D5W 1 GM/50ML IV SOLN
1.0000 g | Freq: Three times a day (TID) | INTRAVENOUS | Status: AC
Start: 2016-05-10 — End: 2016-05-11
  Administered 2016-05-10 – 2016-05-11 (×2): 1 g via INTRAVENOUS
  Filled 2016-05-10 (×2): qty 50

## 2016-05-10 MED ORDER — THROMBIN 5000 UNITS EX SOLR
CUTANEOUS | Status: AC
  Filled 2016-05-10: qty 5000

## 2016-05-10 MED ORDER — ADULT MULTIVITAMIN W/MINERALS CH
1.0000 | ORAL_TABLET | Freq: Every day | ORAL | Status: DC
  Administered 2016-05-11 – 2016-05-16 (×6): 1 via ORAL
  Filled 2016-05-10 (×6): qty 1

## 2016-05-10 MED ORDER — LACTATED RINGERS IV SOLN
INTRAVENOUS | Status: DC
  Administered 2016-05-10 (×3): via INTRAVENOUS

## 2016-05-10 MED ORDER — ROCURONIUM BROMIDE 100 MG/10ML IV SOLN
INTRAVENOUS | Status: DC | PRN
  Administered 2016-05-10: 20 mg via INTRAVENOUS
  Administered 2016-05-10: 50 mg via INTRAVENOUS

## 2016-05-10 MED ORDER — ONDANSETRON HCL 4 MG/2ML IJ SOLN: 4.0000 mg | INTRAMUSCULAR | Status: DC | PRN

## 2016-05-10 MED ORDER — MENTHOL 3 MG MT LOZG
1.0000 | LOZENGE | OROMUCOSAL | Status: DC | PRN
  Filled 2016-05-10: qty 9

## 2016-05-10 MED ORDER — LACTATED RINGERS IV SOLN
INTRAVENOUS | Status: DC | PRN
  Administered 2016-05-10 (×2): via INTRAVENOUS

## 2016-05-10 MED ORDER — TETANUS-DIPHTH-ACELL PERTUSSIS 5-2.5-18.5 LF-MCG/0.5 IM SUSP
0.5000 mL | Freq: Once | INTRAMUSCULAR | Status: AC
  Administered 2016-05-10: 0.5 mL via INTRAMUSCULAR
  Filled 2016-05-10: qty 0.5

## 2016-05-10 MED ORDER — SURGIFOAM 100 EX MISC
CUTANEOUS | Status: DC | PRN
  Administered 2016-05-10: 13:00:00 via TOPICAL

## 2016-05-10 MED ORDER — 0.9 % SODIUM CHLORIDE (POUR BTL) OPTIME
TOPICAL | Status: DC | PRN
  Administered 2016-05-10: 1000 mL

## 2016-05-10 SURGICAL SUPPLY — 60 items
BAG DECANTER FOR FLEXI CONT (MISCELLANEOUS) ×2 IMPLANT
BENZOIN TINCTURE PRP APPL 2/3 (GAUZE/BANDAGES/DRESSINGS) ×2 IMPLANT
BUR CUTTER 7.0 ROUND (BURR) IMPLANT
BUR MATCHSTICK NEURO 3.0 LAGG (BURR) ×2 IMPLANT
CANISTER SUCT 3000ML PPV (MISCELLANEOUS) ×2 IMPLANT
CAP LCK SPNE (Orthopedic Implant) ×9 IMPLANT
CAP LOCK SPINE RADIUS (Orthopedic Implant) ×9 IMPLANT
CAP LOCKING (Orthopedic Implant) ×9 IMPLANT
CARTRIDGE OIL MAESTRO DRILL (MISCELLANEOUS) ×1 IMPLANT
CATH COUDE FOLEY 2W 5CC 16FR (CATHETERS) IMPLANT
CATH COUDE FOLEY 5CC 14FR (CATHETERS) ×2 IMPLANT
CONT SPEC 4OZ CLIKSEAL STRL BL (MISCELLANEOUS) ×2 IMPLANT
COVER BACK TABLE 60X90IN (DRAPES) ×2 IMPLANT
CROSSLINK MEDIUM (Orthopedic Implant) ×2 IMPLANT
DERMABOND ADVANCED (GAUZE/BANDAGES/DRESSINGS) ×2
DERMABOND ADVANCED .7 DNX12 (GAUZE/BANDAGES/DRESSINGS) ×2 IMPLANT
DIFFUSER DRILL AIR PNEUMATIC (MISCELLANEOUS) ×2 IMPLANT
DRAPE C-ARM 42X72 X-RAY (DRAPES) ×4 IMPLANT
DRAPE HALF SHEET 40X57 (DRAPES) ×2 IMPLANT
DRAPE LAPAROTOMY 100X72X124 (DRAPES) ×2 IMPLANT
DRAPE POUCH INSTRU U-SHP 10X18 (DRAPES) ×2 IMPLANT
DRAPE SURG 17X23 STRL (DRAPES) ×8 IMPLANT
DRSG OPSITE POSTOP 4X10 (GAUZE/BANDAGES/DRESSINGS) ×2 IMPLANT
DURAPREP 26ML APPLICATOR (WOUND CARE) ×2 IMPLANT
ELECT REM PT RETURN 9FT ADLT (ELECTROSURGICAL) ×2
ELECTRODE REM PT RTRN 9FT ADLT (ELECTROSURGICAL) ×1 IMPLANT
GLOVE BIOGEL PI IND STRL 7.5 (GLOVE) ×2 IMPLANT
GLOVE BIOGEL PI IND STRL 8 (GLOVE) ×2 IMPLANT
GLOVE BIOGEL PI INDICATOR 7.5 (GLOVE) ×2
GLOVE BIOGEL PI INDICATOR 8 (GLOVE) ×2
GLOVE ECLIPSE 7.5 STRL STRAW (GLOVE) ×8 IMPLANT
GLOVE ECLIPSE 9.0 STRL (GLOVE) ×4 IMPLANT
GOWN STRL REUS W/ TWL XL LVL3 (GOWN DISPOSABLE) ×2 IMPLANT
GOWN STRL REUS W/TWL 2XL LVL3 (GOWN DISPOSABLE) ×4 IMPLANT
GOWN STRL REUS W/TWL XL LVL3 (GOWN DISPOSABLE) ×2
GRAFT BN 10X1XDBM MAGNIFUSE (Bone Implant) ×1 IMPLANT
GRAFT BONE MAGNIFUSE 1X10CM (Bone Implant) ×1 IMPLANT
HEMOSTAT POWDER KIT SURGIFOAM (HEMOSTASIS) ×4 IMPLANT
KIT BASIN OR (CUSTOM PROCEDURE TRAY) ×2 IMPLANT
KIT ROOM TURNOVER OR (KITS) ×2 IMPLANT
MILL MEDIUM DISP (BLADE) ×2 IMPLANT
NEEDLE HYPO 22GX1.5 SAFETY (NEEDLE) ×2 IMPLANT
NS IRRIG 1000ML POUR BTL (IV SOLUTION) ×2 IMPLANT
OIL CARTRIDGE MAESTRO DRILL (MISCELLANEOUS) ×2
PACK LAMINECTOMY NEURO (CUSTOM PROCEDURE TRAY) ×2 IMPLANT
ROD 140MM (Rod) ×4 IMPLANT
SCREW 5.75X45MM (Screw) ×14 IMPLANT
SCREW 6.75X45MM (Screw) ×4 IMPLANT
SEALANT ADHERUS EXTEND TIP (MISCELLANEOUS) ×2 IMPLANT
SPONGE SURGIFOAM ABS GEL 100 (HEMOSTASIS) ×2 IMPLANT
STRIP CLOSURE SKIN 1/2X4 (GAUZE/BANDAGES/DRESSINGS) ×4 IMPLANT
SUT NURALON 4 0 TR CR/8 (SUTURE) ×2 IMPLANT
SUT VIC AB 0 CT1 18XCR BRD8 (SUTURE) ×3 IMPLANT
SUT VIC AB 0 CT1 8-18 (SUTURE) ×3
SUT VIC AB 2-0 CT1 18 (SUTURE) ×4 IMPLANT
SUT VIC AB 3-0 SH 8-18 (SUTURE) ×4 IMPLANT
TOWEL OR 17X24 6PK STRL BLUE (TOWEL DISPOSABLE) ×4 IMPLANT
TOWEL OR 17X26 10 PK STRL BLUE (TOWEL DISPOSABLE) ×2 IMPLANT
TRAY FOLEY W/METER SILVER 16FR (SET/KITS/TRAYS/PACK) ×2 IMPLANT
WATER STERILE IRR 1000ML POUR (IV SOLUTION) ×2 IMPLANT

## 2016-05-10 NOTE — ED Notes (Signed)
To ct

## 2016-05-10 NOTE — ED Notes (Signed)
Dr Redmond Pulling washed the head wound and stapled the rest of the scalo  Aspen collar placed on the pt. Minimal bleeding from the wouond.

## 2016-05-10 NOTE — ED Notes (Signed)
Eye doctor has been called by th edp
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
## 2016-05-10 NOTE — ED Notes (Signed)
Pt c/o pain

## 2016-05-10 NOTE — Transfer of Care (Signed)
Immediate Anesthesia Transfer of Care Note  Patient: ALEKSA CREASEY  Procedure(s) Performed: Procedure(s): THORACIC TWELVE DECOMPRESSIVE LAMINECTOMY, THORACIC TEN-LUMBAR TWO POSTERIOR LATERAL ARTHRODESIS WITH SEGMENTAL PEDICLE SCREW FIXATION WITH AUTOGRAFT (N/A)  Patient Location: PACU  Anesthesia Type:General  Level of Consciousness: awake, alert  and oriented  Airway & Oxygen Therapy: Patient Spontanous Breathing and Patient connected to nasal cannula oxygen  Post-op Assessment: Report given to RN and Post -op Vital signs reviewed and stable  Post vital signs: Reviewed and stable  Last Vitals:  Vitals:   05/10/16 0720 05/10/16 0950  BP: (!) 96/59 109/65  Pulse: 74 77  Resp: (!) 23 (!) 22  Temp: 36.9 C 37.1 C    Last Pain:  Vitals:   05/10/16 0950  TempSrc: Oral  PainSc:          Complications: No apparent anesthesia complications

## 2016-05-10 NOTE — ED Notes (Signed)
Report called  To rn on 4e
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
## 2016-05-10 NOTE — Anesthesia Preprocedure Evaluation (Signed)
Anesthesia Evaluation  Patient identified by MRN, date of birth, ID band Patient awake    Reviewed: Allergy & Precautions, NPO status , Patient's Chart, lab work & pertinent test results  Airway Mallampati: II       Dental  (+) Edentulous Upper, Edentulous Lower   Pulmonary     + decreased breath sounds      Cardiovascular  Rhythm:Regular Rate:Normal     Neuro/Psych    GI/Hepatic   Endo/Other    Renal/GU      Musculoskeletal   Abdominal   Peds  Hematology   Anesthesia Other Findings   Reproductive/Obstetrics                             Anesthesia Physical Anesthesia Plan  ASA: III  Anesthesia Plan: General   Post-op Pain Management:    Induction: Intravenous  Airway Management Planned: Oral ETT  Additional Equipment: Arterial line and CVP  Intra-op Plan:   Post-operative Plan: Possible Post-op intubation/ventilation  Informed Consent: I have reviewed the patients History and Physical, chart, labs and discussed the procedure including the risks, benefits and alternatives for the proposed anesthesia with the patient or authorized representative who has indicated his/her understanding and acceptance.     Plan Discussed with: CRNA and Anesthesiologist  Anesthesia Plan Comments:         Anesthesia Quick Evaluation

## 2016-05-10 NOTE — ED Notes (Signed)
Consult to neuro surgery also

## 2016-05-10 NOTE — ED Notes (Signed)
Pt returned from ct
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
## 2016-05-10 NOTE — ED Notes (Signed)
Fast is positive per dr Tyrone Nine

## 2016-05-10 NOTE — ED Notes (Signed)
t 90.8 axillary  Will do rectal temp when he returns from c-t
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
## 2016-05-10 NOTE — Progress Notes (Signed)
Orthopedic Tech Progress Note Patient Details:  [name] Sep 26, 1960 [id number]  Ortho Devices Ortho Device/Splint Location: Norina Buzzard 05/10/2016, 4:42 PM

## 2016-05-10 NOTE — Anesthesia Postprocedure Evaluation (Addendum)
Anesthesia Post Note  Patient: [name]  Procedure(s) Performed: Procedure(s) (LRB): THORACIC TWELVE DECOMPRESSIVE LAMINECTOMY, THORACIC TEN-LUMBAR TWO POSTERIOR LATERAL ARTHRODESIS WITH SEGMENTAL PEDICLE SCREW FIXATION WITH AUTOGRAFT (N/A)  Patient location during evaluation: PACU Anesthesia Type: General Level of consciousness: awake, awake and alert and oriented Pain management: pain level controlled Vital Signs Assessment: post-procedure vital signs reviewed and stable Respiratory status: spontaneous breathing, nonlabored ventilation and respiratory function stable Cardiovascular status: blood pressure returned to baseline Anesthetic complications: no       Last Vitals:  Vitals:   05/10/16 0950 05/10/16 1512  BP: 109/65 (!) 146/68  Pulse: 77 74  Resp: (!) 22 (!) 26  Temp: 37.1 C 36.6 C    Last Pain:  Vitals:   05/10/16 0950  TempSrc: Oral  PainSc:                  [name]
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
## 2016-05-10 NOTE — Anesthesia Procedure Notes (Signed)
Procedure Name: Intubation Date/Time: 05/10/2016 10:58 AM Performed by: Clearnce Sorrel Pre-anesthesia Checklist: Patient identified, Emergency Drugs available, Suction available, Patient being monitored and Timeout performed Patient Re-evaluated:Patient Re-evaluated prior to inductionOxygen Delivery Method: Circle system utilized Preoxygenation: Pre-oxygenation with 100% oxygen Intubation Type: IV induction and Rapid sequence Laryngoscope Size: Glidescope and 4 (limited neck ROM) Grade View: Grade I Tube type: Oral Tube size: 7.5 mm Number of attempts: 1 Airway Equipment and Method: Stylet Placement Confirmation: ETT inserted through vocal cords under direct vision,  positive ETCO2 and breath sounds checked- equal and bilateral Secured at: 23 cm Tube secured with: Tape Dental Injury: Teeth and Oropharynx as per pre-operative assessment

## 2016-05-10 NOTE — Progress Notes (Signed)
PT Cancellation Note  Patient Details Name: [name] MRN: [id number] DOB: 29-Sep-1960   Cancelled Treatment:    Reason Eval/Treat Not Completed: Patient at procedure or test/unavailable.  Patient to OR today.  Will return tomorrow for PT evaluation as appropriate for patient.   Despina Pole 05/10/2016, 4:42 PM Carita Pian. Sanjuana Kava, [place] Pager [phone]

## 2016-05-10 NOTE — Progress Notes (Signed)
Orthopedic Tech Progress Note Patient Details:  [name] 1960-07-17 [id number]  Patient ID: [name], male   DOB: [birth year], 56 y.o.   MRN: [id number]   [name] 05/10/2016, 4:46 PMCalled [employer] for Lumbar brace.

## 2016-05-10 NOTE — ED Notes (Signed)
QNS for blood bank sample and INR nurse aware and will try to pull from IV.

## 2016-05-10 NOTE — Brief Op Note (Signed)
05/09/2016 - 05/10/2016  3:21 PM  PATIENT:  [name]  56 y.o. male  PRE-OPERATIVE DIAGNOSIS:  T12 fracture with stenosis  POST-OPERATIVE DIAGNOSIS:  T12 fracture with stenosis  PROCEDURE:  Procedure(s): THORACIC TWELVE DECOMPRESSIVE LAMINECTOMY, THORACIC TEN-LUMBAR TWO POSTERIOR LATERAL ARTHRODESIS WITH SEGMENTAL PEDICLE SCREW FIXATION WITH AUTOGRAFT (N/A)  SURGEON:  Surgeon(s) and Role:    * Earnie Larsson, MD - Primary  PHYSICIAN ASSISTANT:   ASSISTANTS:    ANESTHESIA:   general  EBL:  Total I/O In: 2200 [I.V.:2200] Out: 290 [Urine:90; Blood:200]  BLOOD ADMINISTERED:none  DRAINS: none   LOCAL MEDICATIONS USED:  MARCAINE     SPECIMEN:  No Specimen  DISPOSITION OF SPECIMEN:  N/A  COUNTS:  YES  TOURNIQUET:  * No tourniquets in log *  DICTATION: .Dragon Dictation  PLAN OF CARE: Admit to inpatient   PATIENT DISPOSITION:  PACU - hemodynamically stable.   Delay start of Pharmacological VTE agent (>24hrs) due to surgical blood loss or risk of bleeding: yes

## 2016-05-10 NOTE — Anesthesia Procedure Notes (Signed)
Central Venous Catheter Insertion Performed by: Roberts Gaudy, anesthesiologist Start/End1/13/2018 11:10 AM, 05/10/2016 11:20 AM Patient location: OR. Preanesthetic checklist: patient identified, IV checked, site marked, risks and benefits discussed, surgical consent, pre-op evaluation and timeout performed Position: Trendelenburg Hand hygiene performed , maximum sterile barriers used  and Seldinger technique used Catheter size: 8 Fr Total catheter length 17. Central line was placed.Double lumen Procedure performed without using ultrasound guided technique. Attempts: 2 Following insertion, line sutured, dressing applied and Biopatch. Post procedure assessment: blood return through all ports, free fluid flow and no air  Patient tolerated the procedure well with no immediate complications.

## 2016-05-10 NOTE — ED Notes (Signed)
Dr Redmond Pulling stapeling head wound

## 2016-05-11 MED ORDER — ONDANSETRON HCL 4 MG/2ML IJ SOLN
4.0000 mg | Freq: Four times a day (QID) | INTRAMUSCULAR | Status: DC | PRN
Start: 2016-05-11 — End: 2016-05-11

## 2016-05-11 MED ORDER — NALOXONE HCL 0.4 MG/ML IJ SOLN
0.4000 mg | INTRAMUSCULAR | Status: DC | PRN
  Administered 2016-05-14: 0.2 mg via INTRAVENOUS
  Filled 2016-05-11: qty 1

## 2016-05-11 MED ORDER — DIPHENHYDRAMINE HCL 50 MG/ML IJ SOLN: 12.5000 mg | Freq: Four times a day (QID) | INTRAMUSCULAR | Status: DC | PRN

## 2016-05-11 MED ORDER — DIPHENHYDRAMINE HCL 12.5 MG/5ML PO ELIX: 12.5000 mg | ORAL_SOLUTION | Freq: Four times a day (QID) | ORAL | Status: DC | PRN

## 2016-05-11 MED ORDER — MORPHINE SULFATE 2 MG/ML IV SOLN: INTRAVENOUS | Status: DC

## 2016-05-11 MED ORDER — LIDOCAINE HCL 2 % EX GEL
1.0000 "application " | Freq: Once | CUTANEOUS | Status: DC
  Filled 2016-05-11: qty 5

## 2016-05-11 MED ORDER — SODIUM CHLORIDE 0.9% FLUSH: 9.0000 mL | INTRAVENOUS | Status: DC | PRN

## 2016-05-11 MED ORDER — NALOXONE HCL 0.4 MG/ML IJ SOLN: 0.4000 mg | INTRAMUSCULAR | Status: DC | PRN

## 2016-05-11 MED ORDER — ONDANSETRON HCL 4 MG/2ML IJ SOLN: 4.0000 mg | Freq: Four times a day (QID) | INTRAMUSCULAR | Status: DC | PRN

## 2016-05-11 MED ORDER — MORPHINE SULFATE 2 MG/ML IV SOLN
INTRAVENOUS | Status: DC
  Administered 2016-05-11 – 2016-05-12 (×3): via INTRAVENOUS
  Administered 2016-05-12: 28.07 mg via INTRAVENOUS
  Administered 2016-05-12 – 2016-05-13 (×2): via INTRAVENOUS
  Administered 2016-05-13: 39 mg via INTRAVENOUS
  Administered 2016-05-13 (×2): via INTRAVENOUS
  Administered 2016-05-13: 10.5 mg via INTRAVENOUS
  Filled 2016-05-11 (×10): qty 25
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
## 2016-05-11 NOTE — Progress Notes (Signed)
Coude catherter insertion attempted and unsuccessful. Some bleeding noted to tip of penis so insertion stopped. Patient's RN in room and nurse made aware to notify MD.
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
## 2016-05-11 NOTE — Progress Notes (Signed)
Notified MD about pt's pain needs and suggested a PCA pump, however MD did not find it appropriate at this time. MD is also aware of Pt having bloody urine with minimal output since foley removal.  MD suggested replacing foley again if no urine within the next 2hrs.

## 2016-05-11 NOTE — Progress Notes (Signed)
Morphine PCA pump syringe replaced. Wasted with Estill Bamberg RN 3.02 remaining from original Syringe. Settings set as loading dose mg, 1.5mg  bolus, min lockout, with 7.5mg  hr dose limit.

## 2016-05-11 NOTE — Progress Notes (Signed)
Orthopedic Tech Progress Note Patient Details:  [name] 03-29-1961 [id number]  Patient ID: [name], male   DOB: 01-04-1961, 56 y.o.   MRN: [id number]   [name] 05/11/2016, 3:09 PMCalled [employer] for TLSO brace.

## 2016-05-11 NOTE — Progress Notes (Signed)
05/11/2016 patient c/o problem voiding bladder scan was done by tech patient had 900 in bladder. Rn attempt to to In and Out cath  With Nurse Tech and unable to. Rn notified Dr Redmond Pulling and order was given to place a foley. Foley was attempted by Elba Barman and assisted by Justice Rocher, but was unsuccessful and Dr Redmond Pulling was made aware. Rn was given an order for Coude cath nurse form rehab was called but was unable to apply foley. Dr Redmond Pulling was made aware by third shift nurse [facility] RN.

## 2016-05-12 ENCOUNTER — Ambulatory Visit: Payer: Medicaid Other

## 2016-05-12 ENCOUNTER — Telehealth: Payer: Self-pay | Admitting: Medical Oncology

## 2016-05-12 ENCOUNTER — Encounter (HOSPITAL_COMMUNITY): Payer: Self-pay | Admitting: Neurosurgery

## 2016-05-12 ENCOUNTER — Inpatient Hospital Stay (HOSPITAL_COMMUNITY): Payer: Medicaid Other

## 2016-05-12 DIAGNOSIS — G894 Chronic pain syndrome: Secondary | ICD-10-CM

## 2016-05-12 DIAGNOSIS — D62 Acute posthemorrhagic anemia: Secondary | ICD-10-CM

## 2016-05-12 DIAGNOSIS — M5137 Other intervertebral disc degeneration, lumbosacral region: Secondary | ICD-10-CM

## 2016-05-12 DIAGNOSIS — M5030 Other cervical disc degeneration, unspecified cervical region: Secondary | ICD-10-CM

## 2016-05-12 DIAGNOSIS — R339 Retention of urine, unspecified: Secondary | ICD-10-CM

## 2016-05-12 DIAGNOSIS — M542 Cervicalgia: Secondary | ICD-10-CM

## 2016-05-12 DIAGNOSIS — S22072A Unstable burst fracture of T9-T10 vertebra, initial encounter for closed fracture: Secondary | ICD-10-CM

## 2016-05-12 DIAGNOSIS — K7460 Unspecified cirrhosis of liver: Secondary | ICD-10-CM

## 2016-05-12 DIAGNOSIS — C61 Malignant neoplasm of prostate: Secondary | ICD-10-CM

## 2016-05-12 DIAGNOSIS — G8918 Other acute postprocedural pain: Secondary | ICD-10-CM

## 2016-05-12 DIAGNOSIS — M51379 Other intervertebral disc degeneration, lumbosacral region without mention of lumbar back pain or lower extremity pain: Secondary | ICD-10-CM

## 2016-05-12 DIAGNOSIS — S22081A Stable burst fracture of T11-T12 vertebra, initial encounter for closed fracture: Secondary | ICD-10-CM

## 2016-05-12 DIAGNOSIS — S22072D Unstable burst fracture of T9-T10 vertebra, subsequent encounter for fracture with routine healing: Secondary | ICD-10-CM

## 2016-05-12 DIAGNOSIS — R0682 Tachypnea, not elsewhere classified: Secondary | ICD-10-CM

## 2016-05-12 LAB — COMPREHENSIVE METABOLIC PANEL
ALT: 47 U/L (ref 17–63)
ANION GAP: 6 (ref 5–15)
AST: 74 U/L — ABNORMAL HIGH (ref 15–41)
Albumin: 2 g/dL — ABNORMAL LOW (ref 3.5–5.0)
Alkaline Phosphatase: 147 U/L — ABNORMAL HIGH (ref 38–126)
BUN: 16 mg/dL (ref 6–20)
CO2: 24 mmol/L (ref 22–32)
Calcium: 7.5 mg/dL — ABNORMAL LOW (ref 8.9–10.3)
Chloride: 105 mmol/L (ref 101–111)
Creatinine, Ser: 0.96 mg/dL (ref 0.61–1.24)
Glucose, Bld: 102 mg/dL — ABNORMAL HIGH (ref 65–99)
Potassium: 4.2 mmol/L (ref 3.5–5.1)
Sodium: 135 mmol/L (ref 135–145)
TOTAL PROTEIN: 5 g/dL — AB (ref 6.5–8.1)
Total Bilirubin: 0.9 mg/dL (ref 0.3–1.2)

## 2016-05-12 LAB — CBC WITH DIFFERENTIAL/PLATELET
Basophils Absolute: 0 10*3/uL (ref 0.0–0.1)
Basophils Relative: 0 %
Eosinophils Absolute: 0.1 10*3/uL (ref 0.0–0.7)
Eosinophils Relative: 1 %
HEMATOCRIT: 18 % — AB (ref 39.0–52.0)
Hemoglobin: 5.9 g/dL — CL (ref 13.0–17.0)
LYMPHS PCT: 8 %
Lymphs Abs: 0.5 10*3/uL — ABNORMAL LOW (ref 0.7–4.0)
MCH: 31.1 pg (ref 26.0–34.0)
MCHC: 32.8 g/dL (ref 30.0–36.0)
MCV: 94.7 fL (ref 78.0–100.0)
MONO ABS: 0.4 10*3/uL (ref 0.1–1.0)
MONOS PCT: 6 %
NEUTROS ABS: 6.2 10*3/uL (ref 1.7–7.7)
Neutrophils Relative %: 86 %
Platelets: 117 10*3/uL — ABNORMAL LOW (ref 150–400)
RBC: 1.9 MIL/uL — ABNORMAL LOW (ref 4.22–5.81)
RDW: 16.3 % — AB (ref 11.5–15.5)
WBC: 7.2 10*3/uL (ref 4.0–10.5)

## 2016-05-12 LAB — PREPARE RBC (CROSSMATCH)

## 2016-05-12 LAB — CDS SEROLOGY

## 2016-05-12 MED ORDER — BACITRACIN ZINC 500 UNIT/GM EX OINT
TOPICAL_OINTMENT | Freq: Two times a day (BID) | CUTANEOUS | Status: DC
  Administered 2016-05-12 – 2016-05-13 (×4): via TOPICAL
  Administered 2016-05-14: 1 via TOPICAL
  Administered 2016-05-14 – 2016-05-15 (×2): via TOPICAL
  Administered 2016-05-15: 1 via TOPICAL
  Administered 2016-05-16: 10:00:00 via TOPICAL
  Filled 2016-05-12: qty 28.35

## 2016-05-12 MED ORDER — SPIRONOLACTONE 25 MG PO TABS
50.0000 mg | ORAL_TABLET | Freq: Two times a day (BID) | ORAL | Status: DC
  Administered 2016-05-12 – 2016-05-16 (×9): 50 mg via ORAL
  Filled 2016-05-12 (×4): qty 2
  Filled 2016-05-12: qty 1
  Filled 2016-05-12 (×4): qty 2

## 2016-05-12 MED ORDER — DOCUSATE SODIUM 100 MG PO CAPS
100.0000 mg | ORAL_CAPSULE | Freq: Two times a day (BID) | ORAL | Status: DC
  Administered 2016-05-12 – 2016-05-16 (×9): 100 mg via ORAL
  Filled 2016-05-12 (×10): qty 1

## 2016-05-12 MED ORDER — IPRATROPIUM-ALBUTEROL 0.5-2.5 (3) MG/3ML IN SOLN
3.0000 mL | Freq: Four times a day (QID) | RESPIRATORY_TRACT | Status: DC
  Administered 2016-05-12 – 2016-05-14 (×7): 3 mL via RESPIRATORY_TRACT
  Filled 2016-05-12 (×8): qty 3

## 2016-05-12 MED ORDER — LEVOTHYROXINE SODIUM 112 MCG PO TABS
112.0000 ug | ORAL_TABLET | Freq: Every day | ORAL | Status: DC
  Administered 2016-05-13 – 2016-05-16 (×4): 112 ug via ORAL
  Filled 2016-05-12 (×4): qty 1

## 2016-05-12 MED ORDER — POTASSIUM CHLORIDE CRYS ER 10 MEQ PO TBCR
10.0000 meq | EXTENDED_RELEASE_TABLET | Freq: Every day | ORAL | Status: DC
  Administered 2016-05-12 – 2016-05-16 (×5): 10 meq via ORAL
  Filled 2016-05-12 (×6): qty 1

## 2016-05-12 MED ORDER — ZOLPIDEM TARTRATE 5 MG PO TABS
5.0000 mg | ORAL_TABLET | Freq: Once | ORAL | Status: AC
  Administered 2016-05-12: 5 mg via ORAL
  Filled 2016-05-12: qty 1

## 2016-05-12 MED ORDER — FUROSEMIDE 40 MG PO TABS
40.0000 mg | ORAL_TABLET | Freq: Every day | ORAL | Status: DC
  Administered 2016-05-12 – 2016-05-16 (×5): 40 mg via ORAL
  Filled 2016-05-12 (×5): qty 1

## 2016-05-12 MED ORDER — SODIUM CHLORIDE 0.9 % IV SOLN
Freq: Once | INTRAVENOUS | Status: AC
  Administered 2016-05-12: 08:00:00 via INTRAVENOUS

## 2016-05-12 MED ORDER — RIFAXIMIN 550 MG PO TABS
550.0000 mg | ORAL_TABLET | Freq: Two times a day (BID) | ORAL | Status: DC
  Administered 2016-05-12 – 2016-05-14 (×5): 550 mg via ORAL
  Filled 2016-05-12 (×5): qty 1

## 2016-05-12 MED ORDER — POLYETHYLENE GLYCOL 3350 17 G PO PACK
17.0000 g | PACK | Freq: Every day | ORAL | Status: DC
  Administered 2016-05-12 – 2016-05-16 (×5): 17 g via ORAL
  Filled 2016-05-12 (×5): qty 1

## 2016-05-12 NOTE — Progress Notes (Signed)
PT Cancellation Note  Patient Details Name: [name] MRN: [id number] DOB: October 08, 1960   Cancelled Treatment:    Reason Eval/Treat Not Completed: Medical issues which prohibited therapy (low Hgb) 5.9   [name] 05/12/2016, 7:33 AM Alben Deeds, PT DPT  [phone]
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
## 2016-05-12 NOTE — Progress Notes (Signed)
OT Cancellation Note  Patient Details Name: [name] MRN: [id number] DOB: 12-02-1960   Cancelled Treatment:    Reason Eval/Treat Not Completed: Other (comment) (HgB 5.9)  [facility], OT/L  [id number] 05/12/2016 05/12/2016, 7:40 AM

## 2016-05-12 NOTE — Progress Notes (Signed)
Complains of pain all over. Patient still with dysesthetic sensation in both lower extremities. Back pain controlled. No true radicular pain. Continues to have at least 4/5 motor strength in both lower extremities exam continues to be limited by pain and effort..    Afebrile. Vital signs are stable. Urine output is good. Dressing dry.  Overall progressing well. Continue efforts at mobilization as patient's pain tolerates.

## 2016-05-12 NOTE — Progress Notes (Signed)
PT has recommended IP Rehab.  Patient was screened by Gerlean Ren for appropriateness for an Inpatient Acute Rehab consult.  Pt. will need IP Rehab consult in the next day or so, once he is able to tolerate more in therapies.   [place] Admissions Coordinator Cell [phone] Office [phone]

## 2016-05-12 NOTE — Progress Notes (Signed)
Rehab admissions - I am following for potential acute inpatient rehab admission.  Patient needs to be able to tolerate and participate more fully prior to potential inpatient rehab admission.  Call me for questions.  [phone]
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
## 2016-05-12 NOTE — Progress Notes (Signed)
CRITICAL VALUE ALERT  Critical value received:  Hgb 5.9  Date of notification:  05/12/2016   Time of notification:  0558  Critical value read back: yes  Nurse who received alert:  Reatha Armour RN  MD notified (1st page):  Dr. Greer Pickerel  Time of first page:  6:31 AM   MD notified (2nd page):   Time of second page:  Responding MD:    Time MD responded:   Paged Dr to primary RN  Nurse confirms she got a return call from paged MD

## 2016-05-12 NOTE — Progress Notes (Signed)
Patient transferred from unit 4E to room 5C02 at this time. Alert and in stable condition.
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
## 2016-05-13 ENCOUNTER — Ambulatory Visit: Payer: Medicaid Other

## 2016-05-13 LAB — TYPE AND SCREEN
BLOOD PRODUCT EXPIRATION DATE: 201801232359
BLOOD PRODUCT EXPIRATION DATE: 201801252359
BLOOD PRODUCT EXPIRATION DATE: 201801312359
Blood Product Expiration Date: 201801272359
ISSUE DATE / TIME: 201801122332
ISSUE DATE / TIME: 201801150145
ISSUE DATE / TIME: 201801150843
ISSUE DATE / TIME: 201801151727
UNIT TYPE AND RH: 9500
UNIT TYPE AND RH: 9500
Unit Type and Rh: 5100
Unit Type and Rh: 5100

## 2016-05-13 LAB — CBC
HCT: 25.3 % — ABNORMAL LOW (ref 39.0–52.0)
HEMOGLOBIN: 8.4 g/dL — AB (ref 13.0–17.0)
MCH: 30.5 pg (ref 26.0–34.0)
MCHC: 33.2 g/dL (ref 30.0–36.0)
MCV: 92 fL (ref 78.0–100.0)
PLATELETS: 149 10*3/uL — AB (ref 150–400)
RBC: 2.75 MIL/uL — ABNORMAL LOW (ref 4.22–5.81)
RDW: 16.2 % — ABNORMAL HIGH (ref 11.5–15.5)
WBC: 7.9 10*3/uL (ref 4.0–10.5)

## 2016-05-13 MED ORDER — METHOCARBAMOL 1000 MG/10ML IJ SOLN
1000.0000 mg | Freq: Three times a day (TID) | INTRAVENOUS | Status: DC | PRN
  Filled 2016-05-13: qty 10

## 2016-05-13 MED ORDER — TRAMADOL HCL 50 MG PO TABS
50.0000 mg | ORAL_TABLET | Freq: Four times a day (QID) | ORAL | Status: DC
  Administered 2016-05-13 – 2016-05-14 (×4): 50 mg via ORAL
  Filled 2016-05-13 (×4): qty 1

## 2016-05-13 MED ORDER — OXYCODONE HCL 5 MG PO TABS: 5.0000 mg | ORAL_TABLET | ORAL | Status: DC | PRN

## 2016-05-13 MED FILL — Thrombin For Soln 5000 Unit: CUTANEOUS | Qty: 5000 | Status: AC

## 2016-05-13 MED FILL — Heparin Sodium (Porcine) Inj 1000 Unit/ML: INTRAMUSCULAR | Qty: 30 | Status: AC

## 2016-05-13 MED FILL — Sodium Chloride IV Soln 0.9%: INTRAVENOUS | Qty: 1000 | Status: AC
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
## 2016-05-13 NOTE — Consult Note (Signed)
S: Vision improved. Pain OD better.   O: VAN:  OD: 8 pt [place] OS: 5 pt [place]  EOM: full OU  Pupils: OD: 2 mm - fixed (on narcotics), unable to assess APD OS: 2 mm - fixed (on narcotics), unable to assess APD  IOP: by tactile OD: soft OS: soft  External: OD: improved periorbital edema, no proptosis, good orbicularis strength OS: no periorbital edema, no proptosis, good orbicularis strength   Pen Light Exam: L/L: OD: improved edema OS: WNL  C/S: OD: chemosis resolved, 1+ injection OS: white and quiet  K: OD: clear OS: clear  A/C: OD: grossly deep w/o hyphema appearing by pen light OS: grossly deep and quiet appearing by pen light  I: OD: round and regular - no peaking of pupil OS: round and regular  L: OD: NSC OS: NSC  A/P:  1. Conjunctival Foreign Body w/ Minor Corneal Abrasions OD: - Looks healed - can use artificial tear ointment QHS PRN  2. Intraorbital High Density OD: - Exam w/ interval improvement in vision and still no entry site - No intervention recommended at this time   Dispo: - Recommend outpatient follow-up when clinically stable  Justin Brutus T. [name], [name]
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
## 2016-05-13 NOTE — Progress Notes (Addendum)
Patient ID: [name], male   DOB: 08-09-1960, 56 y.o.   MRN: [id number] I called his sister, Ms. Dannielle Karvonen per his request and I addressed her concerns regarding the nursing care and PT.  Georganna Skeans, MD, MPH, FACS Trauma: [phone] General Surgery: [phone]  05/13/2016 4:23 PM

## 2016-05-13 NOTE — Progress Notes (Signed)
Postop day 3. Back pain seems will better controlled. Still having some neuropathic pain into his lower extremities. Mobilizing with therapy.   Afebrile. Vital signs are stable. Urine output good. Moving lower extremities. Was able to stand with therapy. Exam still somewhat limited by pain. All motor groups appear to be working well. Dressing dry. Abdomen soft.  Progressing well following thoracic and lumbar decompression and fusion. Continue efforts at mobilization. Patient will likely need inpatient rehabilitation prior to discharge home.

## 2016-05-14 ENCOUNTER — Ambulatory Visit: Payer: Medicaid Other

## 2016-05-14 LAB — CBC
HCT: 24.2 % — ABNORMAL LOW (ref 39.0–52.0)
HEMOGLOBIN: 8.1 g/dL — AB (ref 13.0–17.0)
MCH: 30.7 pg (ref 26.0–34.0)
MCHC: 33.5 g/dL (ref 30.0–36.0)
MCV: 91.7 fL (ref 78.0–100.0)
Platelets: 155 10*3/uL (ref 150–400)
RBC: 2.64 MIL/uL — ABNORMAL LOW (ref 4.22–5.81)
RDW: 16.2 % — AB (ref 11.5–15.5)
WBC: 8.3 10*3/uL (ref 4.0–10.5)

## 2016-05-14 LAB — BLOOD GAS, ARTERIAL
ACID-BASE EXCESS: 3.5 mmol/L — AB (ref 0.0–2.0)
Bicarbonate: 27.2 mmol/L (ref 20.0–28.0)
DRAWN BY: 249101
O2 CONTENT: 4 L/min
O2 SAT: 83 %
PATIENT TEMPERATURE: 98.6
pCO2 arterial: 38.4 mmHg (ref 32.0–48.0)
pH, Arterial: 7.464 — ABNORMAL HIGH (ref 7.350–7.450)
pO2, Arterial: 46.7 mmHg — ABNORMAL LOW (ref 83.0–108.0)

## 2016-05-14 MED ORDER — NALOXONE HCL 0.4 MG/ML IJ SOLN: 0.4000 mg | INTRAMUSCULAR | Status: DC | PRN

## 2016-05-14 MED ORDER — IPRATROPIUM-ALBUTEROL 0.5-2.5 (3) MG/3ML IN SOLN
3.0000 mL | Freq: Two times a day (BID) | RESPIRATORY_TRACT | Status: DC
  Administered 2016-05-14 – 2016-05-16 (×4): 3 mL via RESPIRATORY_TRACT
  Filled 2016-05-14 (×4): qty 3

## 2016-05-14 MED ORDER — ONDANSETRON HCL 4 MG/2ML IJ SOLN: 4.0000 mg | Freq: Four times a day (QID) | INTRAMUSCULAR | Status: DC | PRN

## 2016-05-14 MED ORDER — TRAMADOL HCL 50 MG PO TABS
100.0000 mg | ORAL_TABLET | Freq: Four times a day (QID) | ORAL | Status: DC
  Administered 2016-05-14 – 2016-05-16 (×8): 100 mg via ORAL
  Filled 2016-05-14 (×9): qty 2

## 2016-05-14 MED ORDER — HYDROMORPHONE HCL 1 MG/ML IJ SOLN
0.5000 mg | INTRAMUSCULAR | Status: DC | PRN
Start: 2016-05-14 — End: 2016-05-15
  Administered 2016-05-14 – 2016-05-15 (×3): 0.5 mg via INTRAVENOUS
  Filled 2016-05-14 (×3): qty 1

## 2016-05-14 MED ORDER — SODIUM CHLORIDE 0.9% FLUSH: 9.0000 mL | INTRAVENOUS | Status: DC | PRN

## 2016-05-14 MED ORDER — DIPHENHYDRAMINE HCL 50 MG/ML IJ SOLN: 12.5000 mg | Freq: Four times a day (QID) | INTRAMUSCULAR | Status: DC | PRN

## 2016-05-14 MED ORDER — DIPHENHYDRAMINE HCL 12.5 MG/5ML PO ELIX: 12.5000 mg | ORAL_SOLUTION | Freq: Four times a day (QID) | ORAL | Status: DC | PRN

## 2016-05-14 MED ORDER — OXYCODONE HCL 5 MG PO TABS
10.0000 mg | ORAL_TABLET | ORAL | Status: DC | PRN
  Administered 2016-05-14 – 2016-05-15 (×6): 20 mg via ORAL
  Administered 2016-05-16 (×2): 10 mg via ORAL
  Administered 2016-05-16: 20 mg via ORAL
  Administered 2016-05-16: 10 mg via ORAL
  Filled 2016-05-14 (×3): qty 4
  Filled 2016-05-14: qty 2
  Filled 2016-05-14 (×4): qty 4
  Filled 2016-05-14 (×3): qty 2
  Filled 2016-05-14: qty 4

## 2016-05-14 MED ORDER — ALBUTEROL SULFATE (2.5 MG/3ML) 0.083% IN NEBU: 2.5000 mg | INHALATION_SOLUTION | RESPIRATORY_TRACT | Status: DC | PRN

## 2016-05-14 MED ORDER — METHOCARBAMOL 750 MG PO TABS
1500.0000 mg | ORAL_TABLET | Freq: Four times a day (QID) | ORAL | Status: DC
  Administered 2016-05-14 – 2016-05-16 (×9): 1500 mg via ORAL
  Filled 2016-05-14 (×9): qty 2

## 2016-05-14 MED ORDER — FENTANYL 40 MCG/ML IV SOLN
INTRAVENOUS | Status: DC
  Administered 2016-05-14: 06:00:00 via INTRAVENOUS
  Filled 2016-05-14: qty 25
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
## 2016-05-14 NOTE — Progress Notes (Signed)
Overall stable. Patient with a significant desaturation event last night but now resolved.  Pain controlled. Mobilizing slowly. Motor and sensory examination stable. Abdomen soft. Dressing dry.  Continue efforts at mobilization. No new recommendations.
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
## 2016-05-14 NOTE — Significant Event (Signed)
Rapid Response Event Note RN called for O2 sats mid 70's to 80's  Overview: Time Called: [id number] Arrival Time: 0345 Event Type: Respiratory  Initial Focused Assessment: On arrival pt alert, oriented x4, quickly doses back off,  RR even and unlabored with some shallow breathing, breath sounds clear. RT at bedside, pt on 4L [place]. Pupils pinpoint, pt currently using a PCA Morphine pump for pain control. BP 105/41, HR 94, 88% 4L , RR 20  Interventions: ABG 7.46/38.4/46.7/27.2, Narcan 0.2 mg IVP given, placed on venti mask. Pt more awake, answering questions appropriately. BP 127/60, HR 81, RR 18, 94% Venti mask 10L Fio2 45 Plan of Care (if not transferred): Estill Bamberg RN advised to call Trauma to make them aware, determine if they want to continue with PCA morphine pump, pt switched to Fentanyl PCA pump. Encouraged to call for any further needs Event Summary: Name of Physician Notified: Dr. Gershon Crane  at [place]    at    Outcome: Stayed in room and stabalized     [place], [place]

## 2016-05-14 NOTE — Progress Notes (Signed)
Wasting 8 cc of morphine in sharps container, witnessed by jamie s RN

## 2016-05-14 NOTE — Progress Notes (Signed)
1./17./.2018- Respiratory care note- RT called to room due to pt desats into low 80's on 4lpm cannula./  Encouraged pt to cough and deep breath but unable to achieve sats of 90%  ABG drawn and pt placed on venti mask with sats of 93% on 45% venturi mask  Will wean as tolerated  Pt also given narcan by RN

## 2016-05-14 NOTE — Progress Notes (Signed)
NT advised RN that patient's sats was in the low 80's.  Encouraged patient to deep breath and cough.  sats not going up, RN called Rt, who also encouraged deep breathing & cough.  RN & RT repositioned patient.   Sats in low 80s.  RN called Rapid response RN , Patients aleart, talking nonlabored breathing, pin sized pupils, patient continue to push PCA for morphine during this process.  Rapid response RN gave narcan, RT placed patient on 45% venti mask, patient sat achieved 90%.  RN contacted DR who advised to change PCA to fentyl  RN will continue to monitor

## 2016-05-15 ENCOUNTER — Encounter (HOSPITAL_COMMUNITY): Payer: Self-pay | Admitting: Physical Medicine and Rehabilitation

## 2016-05-15 ENCOUNTER — Ambulatory Visit: Payer: Medicaid Other

## 2016-05-15 LAB — EXPECTORATED SPUTUM ASSESSMENT W GRAM STAIN, RFLX TO RESP C

## 2016-05-15 LAB — EXPECTORATED SPUTUM ASSESSMENT W REFEX TO RESP CULTURE

## 2016-05-15 NOTE — Progress Notes (Signed)
Met with pt to discuss discharge planning.  Pt is optimistic; hoping for inpatient rehab admission.  He states that his sister and brother in law will provide care at discharge.  Will continue to follow progress.     W. , RN, BSN  Trauma/Neuro ICU Case Manager [phone] 
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
## 2016-05-15 NOTE — Progress Notes (Signed)
Rehab admissions - I spoke with rehab team today.  We would like patient off of IV pain medications today.  We would also like to see if patient can tolerate therapies better and be able to sit up in chair at least 1 hours.  I will then see patient in am for potential inpatient rehab admission.  Call me for questions.  [phone]
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
## 2016-05-15 NOTE — Progress Notes (Signed)
First sputum culture with too many epithelial cells. Second specimen obtained and sent to lab. [name]
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
## 2016-05-16 ENCOUNTER — Inpatient Hospital Stay (HOSPITAL_COMMUNITY)
Admission: RE | Admit: 2016-05-16 | Payer: Medicaid Other | Source: Intra-hospital | Admitting: Physical Medicine & Rehabilitation

## 2016-05-16 ENCOUNTER — Encounter: Payer: Self-pay | Admitting: Radiation Oncology

## 2016-05-16 ENCOUNTER — Ambulatory Visit: Payer: Medicaid Other

## 2016-05-16 ENCOUNTER — Inpatient Hospital Stay (HOSPITAL_COMMUNITY)
Admission: RE | Admit: 2016-05-16 | Discharge: 2016-06-04 | DRG: 949 | Disposition: A | Discharge status: Home Health | Payer: Medicaid Other | Source: Intra-hospital | Attending: Physical Medicine & Rehabilitation | Admitting: Physical Medicine & Rehabilitation

## 2016-05-16 DIAGNOSIS — S22029D Unspecified fracture of second thoracic vertebra, subsequent encounter for fracture with routine healing: Secondary | ICD-10-CM | POA: Diagnosis not present

## 2016-05-16 DIAGNOSIS — G8220 Paraplegia, unspecified: Secondary | ICD-10-CM

## 2016-05-16 DIAGNOSIS — S80811D Abrasion, right lower leg, subsequent encounter: Secondary | ICD-10-CM | POA: Diagnosis not present

## 2016-05-16 DIAGNOSIS — S22081A Stable burst fracture of T11-T12 vertebra, initial encounter for closed fracture: Secondary | ICD-10-CM | POA: Diagnosis not present

## 2016-05-16 DIAGNOSIS — F17210 Nicotine dependence, cigarettes, uncomplicated: Secondary | ICD-10-CM | POA: Diagnosis not present

## 2016-05-16 DIAGNOSIS — F419 Anxiety disorder, unspecified: Secondary | ICD-10-CM | POA: Diagnosis not present

## 2016-05-16 DIAGNOSIS — R32 Unspecified urinary incontinence: Secondary | ICD-10-CM

## 2016-05-16 DIAGNOSIS — R339 Retention of urine, unspecified: Secondary | ICD-10-CM

## 2016-05-16 DIAGNOSIS — F329 Major depressive disorder, single episode, unspecified: Secondary | ICD-10-CM | POA: Diagnosis not present

## 2016-05-16 DIAGNOSIS — S27322D Contusion of lung, bilateral, subsequent encounter: Secondary | ICD-10-CM

## 2016-05-16 DIAGNOSIS — S12600D Unspecified displaced fracture of seventh cervical vertebra, subsequent encounter for fracture with routine healing: Secondary | ICD-10-CM

## 2016-05-16 DIAGNOSIS — S32039D Unspecified fracture of third lumbar vertebra, subsequent encounter for fracture with routine healing: Secondary | ICD-10-CM

## 2016-05-16 DIAGNOSIS — D696 Thrombocytopenia, unspecified: Secondary | ICD-10-CM | POA: Diagnosis not present

## 2016-05-16 DIAGNOSIS — J90 Pleural effusion, not elsewhere classified: Secondary | ICD-10-CM | POA: Diagnosis not present

## 2016-05-16 DIAGNOSIS — R319 Hematuria, unspecified: Secondary | ICD-10-CM

## 2016-05-16 DIAGNOSIS — S32049D Unspecified fracture of fourth lumbar vertebra, subsequent encounter for fracture with routine healing: Secondary | ICD-10-CM

## 2016-05-16 DIAGNOSIS — D62 Acute posthemorrhagic anemia: Secondary | ICD-10-CM | POA: Diagnosis present

## 2016-05-16 DIAGNOSIS — K7460 Unspecified cirrhosis of liver: Secondary | ICD-10-CM | POA: Diagnosis present

## 2016-05-16 DIAGNOSIS — S0501XD Injury of conjunctiva and corneal abrasion without foreign body, right eye, subsequent encounter: Secondary | ICD-10-CM

## 2016-05-16 DIAGNOSIS — S32029D Unspecified fracture of second lumbar vertebra, subsequent encounter for fracture with routine healing: Secondary | ICD-10-CM

## 2016-05-16 DIAGNOSIS — E871 Hypo-osmolality and hyponatremia: Secondary | ICD-10-CM

## 2016-05-16 DIAGNOSIS — N320 Bladder-neck obstruction: Secondary | ICD-10-CM

## 2016-05-16 DIAGNOSIS — S12500D Unspecified displaced fracture of sixth cervical vertebra, subsequent encounter for fracture with routine healing: Secondary | ICD-10-CM

## 2016-05-16 DIAGNOSIS — M7989 Other specified soft tissue disorders: Secondary | ICD-10-CM | POA: Diagnosis not present

## 2016-05-16 DIAGNOSIS — S32019D Unspecified fracture of first lumbar vertebra, subsequent encounter for fracture with routine healing: Secondary | ICD-10-CM

## 2016-05-16 DIAGNOSIS — S24154D Other incomplete lesion at T11-T12 level of thoracic spinal cord, subsequent encounter: Secondary | ICD-10-CM | POA: Diagnosis present

## 2016-05-16 DIAGNOSIS — K649 Unspecified hemorrhoids: Secondary | ICD-10-CM

## 2016-05-16 DIAGNOSIS — S22082D Unstable burst fracture of T11-T12 vertebra, subsequent encounter for fracture with routine healing: Secondary | ICD-10-CM

## 2016-05-16 DIAGNOSIS — Z981 Arthrodesis status: Secondary | ICD-10-CM

## 2016-05-16 DIAGNOSIS — Z7409 Other reduced mobility: Secondary | ICD-10-CM

## 2016-05-16 DIAGNOSIS — D72829 Elevated white blood cell count, unspecified: Secondary | ICD-10-CM | POA: Diagnosis not present

## 2016-05-16 DIAGNOSIS — C61 Malignant neoplasm of prostate: Secondary | ICD-10-CM | POA: Diagnosis not present

## 2016-05-16 DIAGNOSIS — S0101XD Laceration without foreign body of scalp, subsequent encounter: Secondary | ICD-10-CM | POA: Diagnosis not present

## 2016-05-16 DIAGNOSIS — S24109S Unspecified injury at unspecified level of thoracic spinal cord, sequela: Secondary | ICD-10-CM

## 2016-05-16 DIAGNOSIS — Z79899 Other long term (current) drug therapy: Secondary | ICD-10-CM

## 2016-05-16 DIAGNOSIS — K5900 Constipation, unspecified: Secondary | ICD-10-CM

## 2016-05-16 DIAGNOSIS — G629 Polyneuropathy, unspecified: Secondary | ICD-10-CM

## 2016-05-16 DIAGNOSIS — H547 Unspecified visual loss: Secondary | ICD-10-CM

## 2016-05-16 DIAGNOSIS — S80812D Abrasion, left lower leg, subsequent encounter: Secondary | ICD-10-CM

## 2016-05-16 DIAGNOSIS — M79609 Pain in unspecified limb: Secondary | ICD-10-CM | POA: Diagnosis not present

## 2016-05-16 LAB — URINALYSIS, ROUTINE W REFLEX MICROSCOPIC
Bilirubin Urine: NEGATIVE
GLUCOSE, UA: NEGATIVE mg/dL
KETONES UR: NEGATIVE mg/dL
LEUKOCYTES UA: NEGATIVE
NITRITE: NEGATIVE
PROTEIN: 30 mg/dL — AB
Specific Gravity, Urine: 1.012 (ref 1.005–1.030)
Squamous Epithelial / LPF: NONE SEEN
pH: 6 (ref 5.0–8.0)

## 2016-05-16 LAB — GLUCOSE, CAPILLARY
Glucose-Capillary: 114 mg/dL — ABNORMAL HIGH (ref 65–99)
Glucose-Capillary: 102 mg/dL — ABNORMAL HIGH (ref 65–99)

## 2016-05-16 MED ORDER — ALBUTEROL SULFATE (2.5 MG/3ML) 0.083% IN NEBU
2.5000 mg | INHALATION_SOLUTION | RESPIRATORY_TRACT | Status: DC | PRN
  Administered 2016-05-17 – 2016-05-19 (×2): 2.5 mg via RESPIRATORY_TRACT
  Filled 2016-05-16 (×2): qty 3

## 2016-05-16 MED ORDER — NICOTINE 14 MG/24HR TD PT24
14.0000 mg | MEDICATED_PATCH | Freq: Every day | TRANSDERMAL | Status: DC
  Administered 2016-05-16 – 2016-06-04 (×20): 14 mg via TRANSDERMAL
  Filled 2016-05-16 (×20): qty 1

## 2016-05-16 MED ORDER — OXYCODONE HCL ER 10 MG PO T12A
20.0000 mg | EXTENDED_RELEASE_TABLET | Freq: Two times a day (BID) | ORAL | Status: DC
  Administered 2016-05-16 – 2016-05-20 (×8): 20 mg via ORAL
  Filled 2016-05-16 (×8): qty 2

## 2016-05-16 MED ORDER — FLEET ENEMA 7-19 GM/118ML RE ENEM: 1.0000 | ENEMA | Freq: Once | RECTAL | Status: DC | PRN

## 2016-05-16 MED ORDER — FOLIC ACID 1 MG PO TABS
1.0000 mg | ORAL_TABLET | Freq: Every day | ORAL | Status: DC
  Administered 2016-05-17 – 2016-06-04 (×19): 1 mg via ORAL
  Filled 2016-05-16 (×19): qty 1

## 2016-05-16 MED ORDER — METHOCARBAMOL 750 MG PO TABS
1500.0000 mg | ORAL_TABLET | Freq: Four times a day (QID) | ORAL | Status: DC
  Administered 2016-05-16 – 2016-06-04 (×75): 1500 mg via ORAL
  Filled 2016-05-16 (×75): qty 2

## 2016-05-16 MED ORDER — ALUM & MAG HYDROXIDE-SIMETH 200-200-20 MG/5ML PO SUSP: 30.0000 mL | ORAL | Status: DC | PRN

## 2016-05-16 MED ORDER — OXYCODONE HCL 5 MG PO TABS
10.0000 mg | ORAL_TABLET | ORAL | Status: DC | PRN
Start: 2016-05-16 — End: 2016-05-30
  Administered 2016-05-16 – 2016-05-30 (×75): 15 mg via ORAL
  Filled 2016-05-16 (×75): qty 3

## 2016-05-16 MED ORDER — PREGABALIN 75 MG PO CAPS
75.0000 mg | ORAL_CAPSULE | Freq: Two times a day (BID) | ORAL | Status: DC
  Administered 2016-05-16 – 2016-05-30 (×28): 75 mg via ORAL
  Filled 2016-05-16 (×28): qty 1

## 2016-05-16 MED ORDER — TRAMADOL HCL 50 MG PO TABS
25.0000 mg | ORAL_TABLET | Freq: Four times a day (QID) | ORAL | Status: DC
  Administered 2016-05-16 – 2016-06-04 (×74): 25 mg via ORAL
  Filled 2016-05-16 (×77): qty 1

## 2016-05-16 MED ORDER — PROCHLORPERAZINE 25 MG RE SUPP: 12.5000 mg | Freq: Four times a day (QID) | RECTAL | Status: DC | PRN

## 2016-05-16 MED ORDER — GUAIFENESIN-DM 100-10 MG/5ML PO SYRP: 5.0000 mL | ORAL_SOLUTION | Freq: Four times a day (QID) | ORAL | Status: DC | PRN

## 2016-05-16 MED ORDER — SPIRONOLACTONE 25 MG PO TABS
50.0000 mg | ORAL_TABLET | Freq: Two times a day (BID) | ORAL | Status: DC
  Administered 2016-05-16 – 2016-06-04 (×38): 50 mg via ORAL
  Filled 2016-05-16 (×39): qty 2

## 2016-05-16 MED ORDER — PHENOL 1.4 % MT LIQD: 1.0000 | OROMUCOSAL | Status: DC | PRN

## 2016-05-16 MED ORDER — IPRATROPIUM-ALBUTEROL 0.5-2.5 (3) MG/3ML IN SOLN
3.0000 mL | Freq: Two times a day (BID) | RESPIRATORY_TRACT | Status: DC
  Administered 2016-05-16 – 2016-05-18 (×2): 3 mL via RESPIRATORY_TRACT
  Filled 2016-05-16 (×5): qty 3

## 2016-05-16 MED ORDER — FUROSEMIDE 40 MG PO TABS
40.0000 mg | ORAL_TABLET | Freq: Every day | ORAL | Status: DC
  Administered 2016-05-17 – 2016-05-23 (×7): 40 mg via ORAL
  Filled 2016-05-16 (×7): qty 1

## 2016-05-16 MED ORDER — POLYETHYLENE GLYCOL 3350 17 G PO PACK
17.0000 g | PACK | Freq: Every day | ORAL | Status: DC | PRN
  Administered 2016-05-18 – 2016-05-26 (×8): 17 g via ORAL
  Filled 2016-05-16 (×7): qty 1

## 2016-05-16 MED ORDER — VITAMIN B-1 100 MG PO TABS
100.0000 mg | ORAL_TABLET | Freq: Every day | ORAL | Status: DC
  Administered 2016-05-17 – 2016-06-04 (×19): 100 mg via ORAL
  Filled 2016-05-16 (×19): qty 1

## 2016-05-16 MED ORDER — PROCHLORPERAZINE EDISYLATE 5 MG/ML IJ SOLN: 5.0000 mg | Freq: Four times a day (QID) | INTRAMUSCULAR | Status: DC | PRN

## 2016-05-16 MED ORDER — PROCHLORPERAZINE MALEATE 5 MG PO TABS
5.0000 mg | ORAL_TABLET | Freq: Four times a day (QID) | ORAL | Status: DC | PRN
Start: 2016-05-16 — End: 2016-06-04

## 2016-05-16 MED ORDER — OXYCODONE HCL ER 20 MG PO T12A
20.0000 mg | EXTENDED_RELEASE_TABLET | Freq: Two times a day (BID) | ORAL | Status: DC
  Administered 2016-05-16: 20 mg via ORAL
  Filled 2016-05-16: qty 1

## 2016-05-16 MED ORDER — BACITRACIN ZINC 500 UNIT/GM EX OINT
TOPICAL_OINTMENT | Freq: Two times a day (BID) | CUTANEOUS | Status: DC
  Administered 2016-05-16: 1 via TOPICAL
  Administered 2016-05-17 – 2016-05-18 (×4): via TOPICAL
  Administered 2016-05-19: 1 via TOPICAL
  Administered 2016-05-19 – 2016-05-24 (×10): via TOPICAL
  Administered 2016-05-24: 1 via TOPICAL
  Administered 2016-05-25 – 2016-05-29 (×9): via TOPICAL
  Administered 2016-05-30: 1 via TOPICAL
  Administered 2016-05-30 – 2016-06-03 (×8): via TOPICAL
  Filled 2016-05-16 (×8): qty 28.35

## 2016-05-16 MED ORDER — BISACODYL 10 MG RE SUPP
10.0000 mg | Freq: Every day | RECTAL | Status: DC | PRN
  Administered 2016-05-20: 10 mg via RECTAL
  Filled 2016-05-16 (×2): qty 1

## 2016-05-16 MED ORDER — POTASSIUM CHLORIDE CRYS ER 10 MEQ PO TBCR
10.0000 meq | EXTENDED_RELEASE_TABLET | Freq: Every day | ORAL | Status: DC
  Administered 2016-05-17 – 2016-06-04 (×19): 10 meq via ORAL
  Filled 2016-05-16 (×19): qty 1

## 2016-05-16 MED ORDER — MENTHOL 3 MG MT LOZG
1.0000 | LOZENGE | OROMUCOSAL | Status: DC | PRN
  Filled 2016-05-16: qty 9

## 2016-05-16 MED ORDER — LEVOTHYROXINE SODIUM 112 MCG PO TABS
112.0000 ug | ORAL_TABLET | Freq: Every day | ORAL | Status: DC
  Administered 2016-05-17 – 2016-06-04 (×19): 112 ug via ORAL
  Filled 2016-05-16 (×19): qty 1

## 2016-05-16 MED ORDER — TRAZODONE HCL 50 MG PO TABS
25.0000 mg | ORAL_TABLET | Freq: Every evening | ORAL | Status: DC | PRN
  Administered 2016-05-17 – 2016-05-27 (×10): 50 mg via ORAL
  Filled 2016-05-16 (×10): qty 1

## 2016-05-16 MED ORDER — ADULT MULTIVITAMIN W/MINERALS CH
1.0000 | ORAL_TABLET | Freq: Every day | ORAL | Status: DC
  Administered 2016-05-17 – 2016-06-04 (×19): 1 via ORAL
  Filled 2016-05-16 (×19): qty 1

## 2016-05-16 MED ORDER — PREGABALIN 75 MG PO CAPS
75.0000 mg | ORAL_CAPSULE | Freq: Two times a day (BID) | ORAL | Status: DC
Start: 2016-05-16 — End: 2016-05-16
  Administered 2016-05-16: 75 mg via ORAL
  Filled 2016-05-16: qty 1

## 2016-05-16 NOTE — Progress Notes (Signed)
Pt discharge education and instructions completed with pt. Pt discharge to CIR; report called off to nurse Debbie in rehab. Pt transported off unit via bed with belongings to the side. Delia Heady RN
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
## 2016-05-16 NOTE — Care Management Note (Signed)
Case Management Note  Patient Details  Name: [name] MRN: [id number] Date of Birth: December 17, 1960  Subjective/Objective: Pt medically stable for discharge today.                     Action/Plan: Plan dc to [facility] later today.    Expected Discharge Date:  05/16/16               Expected Discharge Plan:  [place]  In-House Referral:     Discharge planning Services  CM Consult  Post Acute Care Choice:    Choice offered to:     DME Arranged:    DME Agency:     HH Arranged:    HH Agency:     Status of Service:  Completed, signed off  If discussed at [employer], dates discussed:    Additional Comments:  Ella Bodo, RN 05/16/2016, 1:44 PM
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
## 2016-05-16 NOTE — Progress Notes (Signed)
Pt admitted to unit via bed, reviewed orders and rehab routine. Reviewed schedule for therapy and team meetings. Reviewed routine and clothes to wear. Pt states an understanding of information provided. [name]
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
## 2016-05-17 ENCOUNTER — Inpatient Hospital Stay (HOSPITAL_COMMUNITY): Payer: Medicaid Other

## 2016-05-17 DIAGNOSIS — G8220 Paraplegia, unspecified: Secondary | ICD-10-CM

## 2016-05-17 DIAGNOSIS — K7460 Unspecified cirrhosis of liver: Secondary | ICD-10-CM

## 2016-05-17 DIAGNOSIS — M79609 Pain in unspecified limb: Secondary | ICD-10-CM

## 2016-05-17 DIAGNOSIS — E871 Hypo-osmolality and hyponatremia: Secondary | ICD-10-CM

## 2016-05-17 DIAGNOSIS — D62 Acute posthemorrhagic anemia: Secondary | ICD-10-CM

## 2016-05-17 DIAGNOSIS — M7989 Other specified soft tissue disorders: Secondary | ICD-10-CM

## 2016-05-17 LAB — CBC WITH DIFFERENTIAL/PLATELET
BASOS PCT: 0 %
Basophils Absolute: 0 10*3/uL (ref 0.0–0.1)
EOS PCT: 5 %
Eosinophils Absolute: 0.3 10*3/uL (ref 0.0–0.7)
HEMATOCRIT: 26.7 % — AB (ref 39.0–52.0)
Hemoglobin: 8.7 g/dL — ABNORMAL LOW (ref 13.0–17.0)
Lymphocytes Relative: 14 %
Lymphs Abs: 1 10*3/uL (ref 0.7–4.0)
MCH: 30.1 pg (ref 26.0–34.0)
MCHC: 32.6 g/dL (ref 30.0–36.0)
MCV: 92.4 fL (ref 78.0–100.0)
MONO ABS: 0.7 10*3/uL (ref 0.1–1.0)
MONOS PCT: 10 %
NEUTROS ABS: 5.2 10*3/uL (ref 1.7–7.7)
Neutrophils Relative %: 71 %
PLATELETS: 195 10*3/uL (ref 150–400)
RBC: 2.89 MIL/uL — ABNORMAL LOW (ref 4.22–5.81)
RDW: 15.9 % — AB (ref 11.5–15.5)
WBC: 7.3 10*3/uL (ref 4.0–10.5)

## 2016-05-17 LAB — COMPREHENSIVE METABOLIC PANEL
ALBUMIN: 2.3 g/dL — AB (ref 3.5–5.0)
ALT: 43 U/L (ref 17–63)
ANION GAP: 10 (ref 5–15)
AST: 72 U/L — ABNORMAL HIGH (ref 15–41)
Alkaline Phosphatase: 254 U/L — ABNORMAL HIGH (ref 38–126)
BILIRUBIN TOTAL: 1.3 mg/dL — AB (ref 0.3–1.2)
BUN: 15 mg/dL (ref 6–20)
CO2: 27 mmol/L (ref 22–32)
Calcium: 8.7 mg/dL — ABNORMAL LOW (ref 8.9–10.3)
Chloride: 95 mmol/L — ABNORMAL LOW (ref 101–111)
Creatinine, Ser: 0.86 mg/dL (ref 0.61–1.24)
GFR calc Af Amer: >60 mL/min (ref >60)
Glucose, Bld: 104 mg/dL — ABNORMAL HIGH (ref 65–99)
POTASSIUM: 3.5 mmol/L (ref 3.5–5.1)
Sodium: 132 mmol/L — ABNORMAL LOW (ref 135–145)
TOTAL PROTEIN: 5.9 g/dL — AB (ref 6.5–8.1)

## 2016-05-17 LAB — AMMONIA: Ammonia: 34 umol/L (ref 9–35)

## 2016-05-17 LAB — CULTURE, RESPIRATORY W GRAM STAIN

## 2016-05-17 LAB — CULTURE, RESPIRATORY: CULTURE: NORMAL

## 2016-05-17 LAB — URINE CULTURE: Culture: NO GROWTH

## 2016-05-17 LAB — PROTIME-INR
INR: 1.12
PROTHROMBIN TIME: 14.5 s (ref 11.4–15.2)
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
## 2016-05-17 NOTE — Plan of Care (Signed)
Problem: RH BLADDER ELIMINATION Goal: RH STG MANAGE BLADDER WITH ASSISTANCE STG Manage Bladder With Assistance min  Outcome: Not Progressing Foley catheter for now

## 2016-05-17 NOTE — Progress Notes (Signed)
Physical Therapy Session Note  Patient Details  Name: [name] MRN: [id number] Date of Birth: [birth year]  Today's Date: 05/17/2016 PT Individual Time: 1345-1430 PT Individual Time Calculation (min): 45 min   Short Term Goals: Week 1:  PT Short Term Goal 1 (Week 1): Pt will be able to perform basic transfers with mod assist PT Short Term Goal 2 (Week 1): Pt will be able to gait x 25' with mod assist PT Short Term Goal 3 (Week 1): Pt will be able to do 4 stairs with mod assist  Skilled Therapeutic Interventions/Progress Updates:  W/c mobility training with supervision and extra time x 65' to fatigue with cues for efficient propulsion. Simulated car transfer training with max assist for stand pivot technique with cues for hand placement and safety. Attempted squat pivot initially out of car, but pt unable to sequence or problem solve technique, so used RW instead. Pt with decreased recall of new information during session and delayed processing noted. Pt performed oral hygiene at sink with set-up assistance and washing face w/c level at end of session. Pt's other sister, [name] present during session.   Therapy Documentation Precautions:  Precautions Precautions: Back, Fall Precaution Booklet Issued: No Precaution Comments: Pt unable to recall; requires cues for functional adherence Required Braces or Orthoses: Spinal Brace Spinal Brace: Thoracolumbosacral orthotic, Applied in sitting position Restrictions Weight Bearing Restrictions: No Pain: Pain Assessment Pain Assessment: 0-10 Pain Score: 7  Pain Location: Back Pain Orientation: Mid;Lower Pain Descriptors / Indicators: Aching;Burning Pain Onset: On-going Patients Stated Pain Goal: 6 Pain Intervention(s): Medication (See eMAR);Repositioned Multiple Pain Sites: Yes   See Function Navigator for Current Functional Status.   Therapy/Group: Individual Therapy  Canary Brim Ivory Broad, PT, DPT  05/17/2016,  3:24 PM

## 2016-05-17 NOTE — Progress Notes (Signed)
Preliminary results by tech - Venous Duplex Lower Ext. Completed. Negative for deep and superficial vein thrombosis in both legs.  Leaann Nevils, BS, RDMS, RVT  
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
## 2016-05-18 ENCOUNTER — Inpatient Hospital Stay (HOSPITAL_COMMUNITY): Payer: Medicaid Other

## 2016-05-18 NOTE — Progress Notes (Signed)
[facility] PHYSICAL MEDICINE & REHABILITATION     PROGRESS NOTE    Subjective/Complaints: Had a reasonable night. States that medications are working for pain. Hasn't been up with therapy yet today  ROS: pt denies nausea, vomiting, diarrhea, cough, shortness of breath or chest pain   Objective: Vital Signs: Blood pressure (!) 98/58, pulse 77, temperature 98 F (36.7 C), temperature source Oral, resp. rate 16, height 5\' 10"  (1.778 m), weight 92.4 kg (203 lb 12.8 oz), SpO2 98 %. No results found.  Recent Labs  05/17/16 0640  WBC 7.3  HGB 8.7*  HCT 26.7*  PLT 195    Recent Labs  05/17/16 0640  NA 132*  K 3.5  CL 95*  GLUCOSE 104*  BUN 15  CREATININE 0.86  CALCIUM 8.7*   CBG (last 3)   Recent Labs  05/16/16 0942 05/16/16 1134  GLUCAP 114* 102*    Wt Readings from Last 3 Encounters:  05/16/16 92.4 kg (203 lb 12.8 oz)  05/10/16 100.6 kg (221 lb 11.2 oz)  05/08/16 96.6 kg (213 lb)    Physical Exam:  Constitutional: He is oriented to person, place, and time. He appears well-developedand well-nourished.  No distress  HENT:  Head: Normocephalicand atraumatic.  Eyes: conjunctivae injected/some blood.   Neck: Normal range of motion. Neck supple. No tracheal deviationpresent. No thyromegalypresent.  Cardiovascular: RRR. Respiratory:  Normal effort Decreased sounds at bases GI: Soft. Bowel sounds are normal. He exhibits no distension. There is no tenderness.  Musculoskeletal: tr to 1+ edema bilateral LE's.  Neurological: He is alertand oriented to person, place, and time.  UE motor nearly 5/5 with ongoing pain inhibition due to back pain. LE: 2-3/5 HF, 3/5 KE and 3 to 3+/5 bilateral ADF/PF with an element of pain inhibition also. Decreased LT below chest although can sense pain. Complained of dysesthesias.  Skin: Skin is warmand dry.  Dry scabbed abrasions on right forearm. Lacerations/abrasions on bilateral legs  Psych: pleasant and  cooperative  Assessment/Plan: 1. Weakness, functional deficits secondary to T12 burst fracture/polytrauma with paraplegia which require 3+ hours per day of interdisciplinary therapy in a comprehensive inpatient rehab setting. Physiatrist is providing close team supervision and 24 hour management of active medical problems listed below. Physiatrist and rehab team continue to assess barriers to discharge/monitor patient progress toward functional and medical goals.  Function:  Bathing Bathing position   Position: Other (comment) (did not occur)  Bathing parts      Bathing assist        Upper Body Dressing/Undressing Upper body dressing   What is the patient wearing?: Hospital gown                Upper body assist        Lower Body Dressing/Undressing Lower body dressing   What is the patient wearing?: Hospital Gown, Non-skid slipper socks                              Lower body assist        Toileting Toileting     Toileting steps completed by helper: Adjust clothing prior to toileting, Performs perineal hygiene, Adjust clothing after toileting    Toileting assist Assist level:  (Total assist)   Transfers Chair/bed transfer   Chair/bed transfer method: Stand pivot Chair/bed transfer assist level: Moderate assist (Pt 50 - 74%/lift or lower) Chair/bed transfer assistive device: Medical sales representative  Max distance: 5' Assist level: 2 helpers (mod to max assist with +2 for w/c follow)   Wheelchair     Max wheelchair distance: 120' Assist Level: Supervision or verbal cues  Cognition Comprehension Comprehension assist level: Understands basic 90% of the time/cues < 10% of the time  Expression Expression assist level: Expresses basic 90% of the time/requires cueing < 10% of the time.  Social Interaction Social Interaction assist level: Interacts appropriately 75 - 89% of the time - Needs redirection for appropriate language or to  initiate interaction.  Problem Solving Problem solving assist level: Solves basic 50 - 74% of the time/requires cueing 25 - 49% of the time  Memory Memory assist level: Recognizes or recalls 75 - 89% of the time/requires cueing 10 - 24% of the time   Medical Problem List and Plan: 1. Functional and mobility deficitssecondary to multiple spinal fractures including T12 Burst fracture with incomplete paraplegia -continue CIR therapies 2. DVT Prophylaxis/Anticoagulation: Mechanical: Sequential compression devices, below knee Bilateral lower extremities 3. Pain Management: Continue Oxy IR with scheduled robaxin and ultram. oxycontin also seems to be helping as well  - Decreased ultram due to liver disease.   -continue Local measures with heat/ice also.   -lyrica for neuropathy.  4. Mood: LCSW to follow for evaluation and support.  5. Neuropsych: This patient iscapable of making decisions on hisown behalf. 6. Skin/Wound Care: Routine pressure relief measures.  7. Fluids/Electrolytes/Nutrition: Monitor I/O.   -sodium only 132--re-check this week  -encourage PO  8. Prostate cancer: Was undergoing weekly XRT PTA.  9. Bladder neck contracture with retention: Continue foley for now--voiding trial next week>? 10. Mild corneal abrasions OD: Poor vision right eye.  11. ABLA: Monitor H/H for now. Labs pending    12. Cirrhosis of liver: Thrombocytopenia resolving. Reports that he was onxifamin and lactulose but was not taking as prescribed--managed by Dr. Watt Climes. Requested to have family bring in meds.   -ammonia level 34  13. Bilateral lung contusions with effusions: reiterated IS to help with SOB/mobilize secretions.  14. Hematuria/bladder: urine cleared after flushing foley.   -discussed with patient that we will remove foley tomorrow and begin voiding trial.   LOS (Days) 2 A [place] T, MD 05/18/2016 9:01 AM

## 2016-05-18 NOTE — Progress Notes (Signed)
Occupational Therapy Session Note  Patient Details  Name: [name] MRN: [id number] Date of Birth: [birth year]  Today's Date: 05/18/2016 OT Individual Time: 1000-1030 OT Individual Time Calculation (min): 30 min   Short Term Goals: Week 1:  OT Short Term Goal 1 (Week 1): Complete bathing at shower level with mod assist using AE/DME, PRN OT Short Term Goal 2 (Week 1): Complete dressing using AE with mod assist  OT Short Term Goal 3 (Week 1): Don TLSO sitting at EOB with mod assist OT Short Term Goal 4 (Week 1): Perform BADL with min vc to adhere to back precautions  Skilled Therapeutic Interventions/Progress Updates: ADL-retraining with emphasis on bed mobility, improved independence with donning TLSO, and transfers (to [facility] and back to bed).   Pt received supine in bed and receptive for treatment.   Using bed rail and HOB, pt was able to complete log roll to his right with min assist and rise from supine to sitting at EOB with mod assist to manage BLE.   Pt was educated on best method to don TLSO and pt attempted donning, completing 25% with instructional cues and supervision to maintain back precautions.   Pt then requested transfer to [facility] placed at bedside to attempt BM.    Pt required min liftng assist (pt =75%) with bed elevated to 25" height to stand at RW and min lowering assist (pt=90%) with vc to reach back with his arms to lower.   Pt was unproductive for BM after 5 min and returned back to bed at end of session with setup to remove TLSO and mod assist to lift both legs and perform log roll properly.     Therapy Documentation Precautions:  Precautions Precautions: Back, Fall Precaution Booklet Issued: No Precaution Comments: Pt unable to recall; requires cues for functional adherence Required Braces or Orthoses: Spinal Brace Spinal Brace: Thoracolumbosacral orthotic, Applied in sitting position Restrictions Weight Bearing Restrictions: No  Vital Signs: Oxygen Therapy SpO2:  98 % O2 Device: Not Delivered  Pain: Pain Assessment Pain Score: Asleep  ADL: ADL ADL Comments: See Functional Assessment Tool  See Function Navigator for Current Functional Status.   Therapy/Group: Individual Therapy  Patsy Varma 05/18/2016, 10:41 AM

## 2016-05-19 ENCOUNTER — Inpatient Hospital Stay (HOSPITAL_COMMUNITY): Payer: Medicaid Other | Admitting: Physical Therapy

## 2016-05-19 ENCOUNTER — Ambulatory Visit: Payer: Medicaid Other

## 2016-05-19 ENCOUNTER — Inpatient Hospital Stay (HOSPITAL_COMMUNITY): Payer: Medicaid Other

## 2016-05-19 ENCOUNTER — Inpatient Hospital Stay (HOSPITAL_COMMUNITY): Payer: Medicaid Other | Admitting: Occupational Therapy

## 2016-05-19 DIAGNOSIS — N319 Neuromuscular dysfunction of bladder, unspecified: Secondary | ICD-10-CM
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
## 2016-05-19 NOTE — Progress Notes (Signed)
Occupational Therapy Session Note  Patient Details  Name: [name] MRN: [id number] Date of Birth: 1960/10/10  Today's Date: 05/19/2016 OT Individual Time: 1050-1200 OT Individual Time Calculation (min): 70 min    Short Term Goals: Week 1:  OT Short Term Goal 1 (Week 1): Complete bathing at shower level with mod assist using AE/DME, PRN OT Short Term Goal 2 (Week 1): Complete dressing using AE with mod assist  OT Short Term Goal 3 (Week 1): Don TLSO sitting at EOB with mod assist OT Short Term Goal 4 (Week 1): Perform BADL with min vc to adhere to back precautions  Skilled Therapeutic Interventions/Progress Updates:    Pt seen for OT ADL bathing/dressing session. Pt sitting up in w/Cook upon arrival, voicing 8/10 pain "all over" at rest, progressing to 10/10 pain with mobility. RN aware and pt not yet due for medications. Pt stated he was willing to work as hard as he could with the pain. Completed bathing/dressing at sink. Pt requiring UE supported on sink ledge when sitting without TLSO donned during UB bathing due to feeling "unstable" without brace. Assist required for LB bathing/dressing due to pain. Assist provided, however, also introduced AE including reacher to use of next session. Pt attempted donning pants using reacher, able to complete with min A for clothing management and VCs for technique.  Attempted to stand to have pt urinate to assist with emptying, however, was unable to void in standing. Pt left seated in w/Cook at end of session, all needs in reach.   Therapy Documentation Precautions:  Precautions Precautions: Back, Fall Precaution Booklet Issued: No Precaution Comments: Pt unable to recall; requires cues for functional adherence Required Braces or Orthoses: Spinal Brace Spinal Brace: Thoracolumbosacral orthotic, Applied in sitting position Restrictions Weight Bearing Restrictions: No ADL: ADL ADL Comments: See Functional Assessment Tool  See Function  Navigator for Current Functional Status.   Therapy/Group: Individual Therapy  [name] 05/19/2016, 7:20 AM

## 2016-05-19 NOTE — Plan of Care (Signed)
Problem: RH Stairs Goal: LTG Patient will ambulate up and down stairs w/assist (PT) LTG: Patient will ambulate up and down # of stairs with assistance (PT)  Upgraded d/t decreased family support

## 2016-05-19 NOTE — Progress Notes (Signed)
Physical Therapy Session Note  Patient Details  Name: [name] MRN: [id number] Date of Birth: 1961/03/01  Today's Date: 05/19/2016 PT Individual Time: 1300-1330 PT Individual Time Calculation (min): 30 min   Short Term Goals: Week 1:  PT Short Term Goal 1 (Week 1): Pt will be able to perform basic transfers with mod assist PT Short Term Goal 2 (Week 1): Pt will be able to gait x 25' with mod assist PT Short Term Goal 3 (Week 1): Pt will be able to do 4 stairs with mod assist  Skilled Therapeutic Interventions/Progress Updates:    Session focused on sit <> stand technique, gait with RW, and education. Pt required mod assist for sit <> stands with cues for hand placement and technique. Pt able to gait x 5' and x 10' with mod to max assist for balance and weightshifting as fatiguing with +2 for w/c follow for safety. Cues for correct positioning of RW to maintain upright posture. Heavy reliance noted on UE's.  Therapy Documentation Precautions:  Precautions Precautions: Back, Fall Precaution Booklet Issued: No Precaution Comments: Pt unable to recall; requires cues for functional adherence Required Braces or Orthoses: Spinal Brace Spinal Brace: Thoracolumbosacral orthotic, Applied in sitting position Restrictions Weight Bearing Restrictions: No Pain: Premedicated for back and rib pain.    See Function Navigator for Current Functional Status.   Therapy/Group: Individual Therapy  Canary Brim Ivory Broad, PT, DPT  05/19/2016, 2:37 PM

## 2016-05-19 NOTE — Progress Notes (Signed)
Inpatient [facility] Individual Statement of Services  Patient Name:  [name]  Date:  05/19/2016  Welcome to the [facility].  Our goal is to provide you with an individualized program based on your diagnosis and situation, designed to meet your specific needs.  With this comprehensive rehabilitation program, you will be expected to participate in at least 3 hours of rehabilitation therapies Monday-Friday, with modified therapy programming on the weekends.  Your rehabilitation program will include the following services:  Physical Therapy (PT), Occupational Therapy (OT), 24 hour per day rehabilitation nursing, Neuropsychology, Case Management (Social Worker), Rehabilitation Medicine, Nutrition Services and Pharmacy Services  Weekly team conferences will be held on Tuesdays to discuss your progress.  Your Social Worker will talk with you frequently to get your input and to update you on team discussions.  Team conferences with you and your family in attendance may also be held.  Expected length of stay: 17 to 21 days  Overall anticipated outcome:  Minimal assistance with bathing, car transfers, and stairs and supervision for all other tasks  Depending on your progress and recovery, your program may change. Your Social Worker will coordinate services and will keep you informed of any changes. Your Social Worker's name and contact numbers are listed  below.  The following services may also be recommended but are not provided by the [facility] will be made to provide these services after discharge if needed.  Arrangements include referral to agencies that provide these services.  Your insurance has been verified to be:  Medicaid Your primary doctor is:  Dr. Elyse Jarvis [place]o  Pertinent information will be shared with  your doctor and your insurance company.  Social Worker:  Alfonse Alpers, LCSW  [phone] or ([phone]  Information discussed with and copy given to patient by: Trey Sailors, 05/19/2016, 11:18 AM

## 2016-05-19 NOTE — Progress Notes (Signed)
[facility] PHYSICAL MEDICINE & REHABILITATION     PROGRESS NOTE    Subjective/Complaints: Fell asleep in an awkward position--leg/back was hurting as a result when woke up. At EOB with PT donning brace currently  ROS pt denies nausea, vomiting, diarrhea, cough, shortness of breath or chest pain    Objective: Vital Signs: Blood pressure (!) 98/54, pulse 75, temperature 99 F (37.2 C), temperature source Oral, resp. rate 18, height 5\' 10"  (1.778 m), weight 92.4 kg (203 lb 12.8 oz), SpO2 94 %. No results found.  Recent Labs  05/17/16 0640  WBC 7.3  HGB 8.7*  HCT 26.7*  PLT 195    Recent Labs  05/17/16 0640  NA 132*  K 3.5  CL 95*  GLUCOSE 104*  BUN 15  CREATININE 0.86  CALCIUM 8.7*   CBG (last 3)   Recent Labs  05/16/16 0942 05/16/16 1134  GLUCAP 114* 102*    Wt Readings from Last 3 Encounters:  05/16/16 92.4 kg (203 lb 12.8 oz)  05/10/16 100.6 kg (221 lb 11.2 oz)  05/08/16 96.6 kg (213 lb)    Physical Exam:  Constitutional: He is oriented to person, place, and time. He appears well-developedand well-nourished.  No distress  HENT:  Head: Normocephalicand atraumatic.  Eyes: conjunctivae injected/some blood.   Neck: Normal range of motion. Neck supple. No tracheal deviationpresent. No thyromegalypresent.  Cardiovascular: RRR. Respiratory:  Normal effort. Clear.  GI: Soft. Bowel sounds are normal. He exhibits no distension. There is no tenderness.  Musculoskeletal: tr to 1+ edema bilateral LE's.  Neurological: He is alertand oriented to person, place, and time.  UE motor nearly 5/5 with ongoing pain inhibition due to back pain. LE: 2-3/5 HF, 3/5 KE and 3 to 3+/5 bilateral ADF/PF with an element of pain inhibition also. Decreased LT below chest although can sense pain. Complained of dysesthesias.  Skin: Skin is warmand dry.  Dry scabbed abrasions on right forearm. Lacerations/abrasions on bilateral legs all healing Psych: pleasant and  cooperative. A little anxious  Assessment/Plan: 1. Weakness, functional deficits secondary to T12 burst fracture/polytrauma with paraplegia which require 3+ hours per day of interdisciplinary therapy in a comprehensive inpatient rehab setting. Physiatrist is providing close team supervision and 24 hour management of active medical problems listed below. Physiatrist and rehab team continue to assess barriers to discharge/monitor patient progress toward functional and medical goals.  Function:  Bathing Bathing position   Position: Other (comment) (did not occur)  Bathing parts      Bathing assist        Upper Body Dressing/Undressing Upper body dressing   What is the patient wearing?: Hospital gown                Upper body assist        Lower Body Dressing/Undressing Lower body dressing   What is the patient wearing?: Hospital Gown, Non-skid slipper socks                              Lower body assist        Toileting Toileting     Toileting steps completed by helper: Adjust clothing prior to toileting, Adjust clothing after toileting (did not void)    Toileting assist Assist level:  (Total assist)   Transfers Chair/bed transfer   Chair/bed transfer method: Stand pivot Chair/bed transfer assist level: Touching or steadying assistance (Pt > 75%) Chair/bed transfer assistive device: Gilford Rile  Locomotion Ambulation     Max distance: 5' Assist level: 2 helpers (mod to max assist with +2 for w/c follow)   Wheelchair     Max wheelchair distance: 120' Assist Level: Supervision or verbal cues  Cognition Comprehension Comprehension assist level: Understands basic 90% of the time/cues < 10% of the time  Expression Expression assist level: Expresses basic 90% of the time/requires cueing < 10% of the time.  Social Interaction Social Interaction assist level: Interacts appropriately 75 - 89% of the time - Needs redirection for appropriate language or to  initiate interaction.  Problem Solving Problem solving assist level: Solves basic 50 - 74% of the time/requires cueing 25 - 49% of the time  Memory Memory assist level: Recognizes or recalls 75 - 89% of the time/requires cueing 10 - 24% of the time   Medical Problem List and Plan: 1. Functional and mobility deficitssecondary to multiple spinal fractures including T12 Burst fracture with incomplete paraplegia -continue CIR therapies 2. DVT Prophylaxis/Anticoagulation: Mechanical: Sequential compression devices, below knee Bilateral lower extremities 3. Pain Management: Continue Oxy IR with scheduled robaxin and ultram. oxycontin also seems to be helping as well  - Decreased ultram due to liver disease.   -continue Local measures with heat/ice also.   -lyrica for neuropathy effective.  4. Mood: LCSW to follow for evaluation and support.  5. Neuropsych: This patient iscapable of making decisions on hisown behalf. 6. Skin/Wound Care: Routine pressure relief measures.  7. Fluids/Electrolytes/Nutrition: Monitor I/O.   -sodium only 132--re-check later this week  -encourage PO  8. Prostate cancer: Was undergoing weekly XRT PTA.  9. Bladder neck contracture with retention: Continue foley for now--voiding trial next week>? 10. Mild corneal abrasions OD: Poor vision right eye.  11. ABLA: Monitor H/H for now. Labs pending    12. Cirrhosis of liver: Thrombocytopenia resolving. Reports that he was onxifamin and lactulose but was not taking as prescribed--managed by Dr. Watt Climes. Requested to have family bring in meds.   -ammonia level 34  13. Bilateral lung contusions with effusions: reiterated IS to help with SOB/mobilize secretions.  14. Hematuria/bladder: urine cleared after flushing foley.   -remove foley today. Begin bladder training   LOS (Days) [place] T, MD 05/19/2016 8:54 AM

## 2016-05-19 NOTE — IPOC Note (Signed)
Overall Plan of Care [facility]) Patient Details Name: [name] MRN: [id number] DOB: [birth year]  Admitting Diagnosis: Burst FX SCI  Hospital Problems: Principal Problem:   Paraplegia ([place]) Active Problems:   Cirrhosis of liver without ascites (HCC)   Prostate cancer (HCC)   Acute blood loss anemia   Burst fracture of twelfth thoracic vertebra [facility])     Functional Problem List: Nursing Pain, Bladder, Bowel, Edema, Endurance, Medication Management, Skin Integrity, Motor  PT Balance, Endurance, Motor, Pain, Safety, Sensory, Skin Integrity  OT Balance, Endurance, Pain, Vision, Cognition  SLP    TR         Basic ADL's: OT Eating, Grooming, Bathing, Dressing, Toileting     Advanced  ADL's: OT       Transfers: PT Bed Mobility, Bed to Chair, Car, Manufacturing systems engineer, Metallurgist: PT Ambulation, Emergency planning/management officer, Stairs     Additional Impairments: OT    SLP        TR      Anticipated Outcomes Item Anticipated Outcome  Self Feeding Mod I  Swallowing      Basic self-care  Min A  Toileting  Min A   Bathroom Transfers Min A  Bowel/Bladder  manage bladder with mod I assist, bowel with mod I assist  Transfers  supervision basic transfers  Locomotion  supervision short distance household gait; mod I w/c mobility; min assist stairs  Communication     Cognition     Pain  Pain at or below level 5 with medications  Safety/Judgment  maintain safety with supervision/cues   Therapy Plan: PT Intensity: Minimum of 1-2 x/day ,45 to 90 minutes PT Frequency: 5 out of 7 days PT Duration Estimated Length of Stay: 17-21 days OT Intensity: Minimum of 1-2 x/day, 45 to 90 minutes OT Frequency: 5 out of 7 days OT Duration/Estimated Length of Stay: 17-21 days         Team Interventions: Nursing Interventions Patient/Family Education, Skin Care/Wound Management, Disease Management/Prevention, Discharge Planning, Bladder Management, Pain Management,  Psychosocial Support, Bowel Management, Medication Management  PT interventions Ambulation/gait training, Balance/vestibular training, Cognitive remediation/compensation, Community reintegration, Discharge planning, Disease management/prevention, DME/adaptive equipment instruction, Functional mobility training, Neuromuscular re-education, Pain management, Patient/family education, Psychosocial support, Skin care/wound management, Splinting/orthotics, Stair training, Therapeutic Activities, Therapeutic Exercise, UE/LE Strength taining/ROM, UE/LE Coordination activities, Wheelchair propulsion/positioning  OT Interventions Discharge planning, DME/adaptive equipment instruction, Functional mobility training, Pain management, Therapeutic Activities, Therapeutic Exercise, Self Care/advanced ADL retraining, Patient/family education, Training and development officer, Cognitive remediation/compensation  SLP Interventions    TR Interventions    SW/CM Interventions Discharge Planning, Psychosocial Support, Patient/Family Education    Team Discharge Planning: Destination: PT-Home (sister's home) ,OT- Home , SLP-  Projected Follow-up: PT-Home health PT, OT-  Home health OT, SLP-  Projected Equipment Needs: PT-Rolling walker with 5" wheels, Wheelchair (measurements), Wheelchair cushion (measurements), OT- To be determined, SLP-  Equipment Details: PT- , OT-  Patient/family involved in discharge planning: PT- Patient, Family member/caregiver,  OT-Patient, SLP-   MD ELOS: 17-21 days Medical Rehab Prognosis:  Excellent Assessment: The patient has been admitted for CIR therapies with the diagnosis of T12 burst fracture with myelopathy, paraplegia. The team will be addressing functional mobility, strength, stamina, balance, safety, adaptive techniques and equipment, self-care, bowel and bladder mgt, patient and caregiver education, pain control, ortho precautions, don/doff of brace, NMR, appropriate orthotics, community  reintegration. Goals have been set at min assist for basic self-care tasks and superviison for transfers and short  distance gait, mod I for w/c mobility.     Meredith Staggers, MD, FAAPMR      See Team Conference Notes for weekly updates to the plan of care

## 2016-05-19 NOTE — Progress Notes (Signed)
Patient information reviewed and entered into eRehab system by Sullivan Jacuinde, RN, CRRN, PPS Coordinator.  Information including medical coding and functional independence measure will be reviewed and updated through discharge.    

## 2016-05-19 NOTE — Plan of Care (Signed)
Problem: RH BOWEL ELIMINATION Goal: RH STG MANAGE BOWEL WITH ASSISTANCE STG Manage Bowel with Assistance.mod I  Outcome: Not Progressing lBM 05-15-16 Goal: RH STG MANAGE BOWEL W/MEDICATION W/ASSISTANCE STG Manage Bowel with Medication with Assistance.mod I  Outcome: Not Progressing LBM 05-15-16

## 2016-05-20 ENCOUNTER — Inpatient Hospital Stay (HOSPITAL_COMMUNITY): Payer: Medicaid Other | Admitting: Physical Therapy

## 2016-05-20 ENCOUNTER — Inpatient Hospital Stay (HOSPITAL_COMMUNITY): Payer: Medicaid Other | Admitting: Occupational Therapy

## 2016-05-20 ENCOUNTER — Ambulatory Visit: Payer: Medicaid Other

## 2016-05-20 MED ORDER — ALPRAZOLAM 0.5 MG PO TABS
0.5000 mg | ORAL_TABLET | Freq: Three times a day (TID) | ORAL | Status: DC | PRN
  Administered 2016-05-21 – 2016-06-04 (×16): 0.5 mg via ORAL
  Filled 2016-05-20 (×16): qty 1

## 2016-05-20 MED ORDER — OXYCODONE HCL ER 15 MG PO T12A
30.0000 mg | EXTENDED_RELEASE_TABLET | Freq: Two times a day (BID) | ORAL | Status: DC
  Administered 2016-05-20 – 2016-06-02 (×26): 30 mg via ORAL
  Filled 2016-05-20 (×25): qty 2

## 2016-05-20 NOTE — Progress Notes (Signed)
Physical Therapy Session Note  Patient Details  Name: [name] MRN: [id number] Date of Birth: 1960/07/15  Today's Date: 05/20/2016 PT Individual Time: 1000-1100 PT Individual Time Calculation (min): 60 min   Short Term Goals: Week 1:  PT Short Term Goal 1 (Week 1): Pt will be able to perform basic transfers with mod assist PT Short Term Goal 2 (Week 1): Pt will be able to gait x 25' with mod assist PT Short Term Goal 3 (Week 1): Pt will be able to do 4 stairs with mod assist  Skilled Therapeutic Interventions/Progress Updates: Pt received seated in w/c, c/o pain as below and agreeable to treatment. W/c propulsion x125' with BUE and S for strengthening and endurance. Stand pivot transfer x4 during session with RW and min guard. Sit <>stand x5 reps from edge of mat with BUE support on RW. 1 set 5 reps no RW and pt's UEs resting on therapist's shoulders to assess amount of weight bearing/compensation with UEs; note heavy leaning on UEs however not pulling to stand. Gait x30' including one turn around cone with minA; slow speed and occasional buckling in LEs able to recover without > minA. Standing alternating toe taps to 1" step for weight shifting, weight bearing and stance control with RW and close S. Performed nustep x8 min with BUE/BLE for strengthening and endurance. Returned to room in w/c totalA for energy conservation. Remained seated in w/c at end of session, all needs in reach.      Therapy Documentation Precautions:  Precautions Precautions: Back, Fall Precaution Booklet Issued: No Precaution Comments: Pt unable to recall; requires cues for functional adherence Required Braces or Orthoses: Spinal Brace Spinal Brace: Thoracolumbosacral orthotic, Applied in sitting position Restrictions Weight Bearing Restrictions: No Pain: Pain Assessment Pain Assessment: 0-10 Pain Score: 2  Pain Type: Acute pain Pain Location: Abdomen Pain Orientation: Lower Pain Descriptors /  Indicators: Aching Pain Frequency: Constant Pain Onset: On-going Patients Stated Pain Goal: 2 Pain Intervention(s): Medication (See eMAR) Multiple Pain Sites: Yes   See Function Navigator for Current Functional Status.   Therapy/Group: Individual Therapy  Luberta Mutter 05/20/2016, 10:48 AM

## 2016-05-20 NOTE — Progress Notes (Signed)
[facility] PHYSICAL MEDICINE & REHABILITATION     PROGRESS NOTE    Subjective/Complaints:  Emptying bladder. Had one incontinent episode. Moving bowels. Still having struggles making it through therapies due to pain.   Review of Systems  Constitutional: Negative for fever.  Eyes: Negative for double vision.  Gastrointestinal: Negative for abdominal pain.  Musculoskeletal: Positive for back pain, joint pain and myalgias.  Psychiatric/Behavioral: Negative for depression.        Objective: Vital Signs: Blood pressure 95/80, pulse 84, temperature 99 F (37.2 C), temperature source Oral, resp. rate 18, height 5\' 10"  (1.778 m), weight 92.4 kg (203 lb 12.8 oz), SpO2 95 %. No results found. No results for input(s): WBC, HGB, HCT, PLT in the last 72 hours. No results for input(s): NA, K, CL, GLUCOSE, BUN, CREATININE, CALCIUM in the last 72 hours.  Invalid input(s): CO CBG (last 3)  No results for input(s): GLUCAP in the last 72 hours.  Wt Readings from Last 3 Encounters:  05/16/16 92.4 kg (203 lb 12.8 oz)  05/10/16 100.6 kg (221 lb 11.2 oz)  05/08/16 96.6 kg (213 lb)    Physical Exam:  Constitutional: He is oriented to person, place, and time. He appears well-developedand well-nourished.  No distress  HENT:  Head: Normocephalicand atraumatic.  Eyes: conjunctivae injected/some blood.   Neck: Normal range of motion. Neck supple. No tracheal deviationpresent. No thyromegalypresent.  Cardiovascular: RRR Respiratory:  Normal effort. Clear.  GI: Soft. Bowel sounds are normal. He exhibits no distension. There is no tenderness.  Musculoskeletal: tr  edema bilateral LE's.  Neurological: He is alertand oriented to person, place, and time.  UE motor nearly 5/5 with ongoing pain inhibition due to back pain. LE: 3-/5 HF, 3/5 KE and 3 to 3+/5 bilateral ADF/PF with an element of pain inhibition also. Decreased LT below chest although still can sense pain.    Skin: Skin is warmand  dry.  Abrasions on legs/trunk all dry/scabbed/healing Psych: pleasant and cooperative. A little anxious  Assessment/Plan: 1. Weakness, functional deficits secondary to T12 burst fracture/polytrauma with paraplegia which require 3+ hours per day of interdisciplinary therapy in a comprehensive inpatient rehab setting. Physiatrist is providing close team supervision and 24 hour management of active medical problems listed below. Physiatrist and rehab team continue to assess barriers to discharge/monitor patient progress toward functional and medical goals.  Function:  Bathing Bathing position   Position: Wheelchair/chair at sink  Bathing parts Body parts bathed by patient: Right arm, Left arm, Chest, Abdomen Body parts bathed by helper: Front perineal area, Buttocks, Right upper leg, Left upper leg, Right lower leg, Left lower leg, Back  Bathing assist        Upper Body Dressing/Undressing Upper body dressing   What is the patient wearing?: Pull over shirt/dress     Pull over shirt/dress - Perfomed by patient: Thread/unthread right sleeve, Thread/unthread left sleeve, Put head through opening Pull over shirt/dress - Perfomed by helper: Pull shirt over trunk        Upper body assist        Lower Body Dressing/Undressing Lower body dressing   What is the patient wearing?: Pants, Non-skid slipper socks       Pants- Performed by helper: Thread/unthread right pants leg, Thread/unthread left pants leg, Pull pants up/down   Non-skid slipper socks- Performed by helper: Don/doff right sock, Don/doff left sock                  Lower body assist  Toileting Toileting   Toileting steps completed by patient: Adjust clothing prior to toileting, Performs perineal hygiene, Adjust clothing after toileting (per Elmo Putt, NT report) Toileting steps completed by helper: Adjust clothing prior to toileting, Adjust clothing after toileting (did not void)    Toileting assist  Assist level:  (Total assist)   Transfers Chair/bed transfer   Chair/bed transfer method: Stand pivot Chair/bed transfer assist level: Touching or steadying assistance (Pt > 75%) Chair/bed transfer assistive device: Medical sales representative     Max distance: 10' Assist level: 2 helpers (mod A with +2 for w/c follow)   Wheelchair   Type: Manual Max wheelchair distance: 130 Assist Level: Supervision or verbal cues  Cognition Comprehension Comprehension assist level: Understands basic 90% of the time/cues < 10% of the time  Expression Expression assist level: Expresses basic 90% of the time/requires cueing < 10% of the time.  Social Interaction Social Interaction assist level: Interacts appropriately 75 - 89% of the time - Needs redirection for appropriate language or to initiate interaction.  Problem Solving Problem solving assist level: Solves basic 50 - 74% of the time/requires cueing 25 - 49% of the time  Memory Memory assist level: Recognizes or recalls 75 - 89% of the time/requires cueing 10 - 24% of the time   Medical Problem List and Plan: 1. Functional and mobility deficitssecondary to multiple spinal fractures including T12 Burst fracture with incomplete paraplegia -continue CIR therapies 2. DVT Prophylaxis/Anticoagulation: Mechanical: Sequential compression devices, below knee Bilateral lower extremities 3. Pain Management: Continue Oxy IR with scheduled robaxin and ultram. oxycontin also seems to be helping as well  - Decreased ultram due to liver disease.   -continue Local measures with heat/ice also.   -lyrica for neuropathy effective.  4. Mood: LCSW to follow for evaluation and support.  5. Neuropsych: This patient iscapable of making decisions on hisown behalf. 6. Skin/Wound Care: Routine pressure relief measures.  7. Fluids/Electrolytes/Nutrition: Monitor I/O.   -sodium only 132--re-check Thursday  -encourage PO  8. Prostate cancer:  Was undergoing weekly XRT PTA.  9. Bladder neck contracture with retention: Continue foley for now--voiding trial next week>? 10. Mild corneal abrasions OD: Poor vision right eye. Showing some improvement 11. ABLA: Monitor H/H for now.      12. Cirrhosis of liver: Thrombocytopenia resolving. Reports that he was onxifamin and lactulose but was not taking as prescribed--managed by Dr. Watt Climes. Requested to have family bring in meds.   -ammonia level 34  13. Bilateral lung contusions with effusions: reiterated IS to help with SOB/mobilize secretions.  14. Hematuria/bladder: urine cleared after flushing foley.   -continue voiding trial  -some incontinence  -recent ua/ucx specimen negative   LOS (Days) 4 A FACE TO FACE EVALUATION WAS PERFORMED  Meredith Staggers, MD 05/20/2016 8:45 AM

## 2016-05-20 NOTE — Progress Notes (Signed)
Occupational Therapy Session Note  Patient Details  Name: [name] MRN: [id number] Date of Birth: July 01, 1960  Today's Date: 05/20/2016 OT Individual Time: [id number] OT Individual Time Calculation (min): 44 min    Short Term Goals: Week 1:  OT Short Term Goal 1 (Week 1): Complete bathing at shower level with mod assist using AE/DME, PRN OT Short Term Goal 2 (Week 1): Complete dressing using AE with mod assist  OT Short Term Goal 3 (Week 1): Don TLSO sitting at EOB with mod assist OT Short Term Goal 4 (Week 1): Perform BADL with min vc to adhere to back precautions  Skilled Therapeutic Interventions/Progress Updates:    Upon entering the room, pt seated in wheelchair awaiting therapist with reports of fatigue. However, pt is agreeable to OT intervention this session. Pt very talkative this session and requiring mod verbal cues to redirect to task. OT demonstrated and educated use of sock aid in order to increase I with LB self care. Pt utilized LH reacher to doff B shoes with increased time and min cues for task. Pt returned demonstration for sock aid with increased time and min cues for proper technique and pt success. Pt declined toileting this session. He remained in wheelchair with call bell and all needed items within reach.   Therapy Documentation Precautions:  Precautions Precautions: Back, Fall Precaution Booklet Issued: No Precaution Comments: Pt unable to recall; requires cues for functional adherence Required Braces or Orthoses: Spinal Brace Spinal Brace: Thoracolumbosacral orthotic, Applied in sitting position Restrictions Weight Bearing Restrictions: No General:   Vital Signs:   Pain: Pain Assessment Pain Score: 1  ADL: ADL ADL Comments: See Functional Assessment Tool Exercises:   Other Treatments:    See Function Navigator for Current Functional Status.   Therapy/Group: Individual Therapy  Gypsy Decant 05/20/2016, 4:17 PM

## 2016-05-20 NOTE — Progress Notes (Signed)
Occupational Therapy Session Note  Patient Details  Name: [name] MRN: [id number] Date of Birth: 1960-09-17  Today's Date: 05/20/2016 OT Individual Time: 1345-1415 OT Individual Time Calculation (min): 30 min    Short Term Goals: Week 1:  OT Short Term Goal 1 (Week 1): Complete bathing at shower level with mod assist using AE/DME, PRN OT Short Term Goal 2 (Week 1): Complete dressing using AE with mod assist  OT Short Term Goal 3 (Week 1): Don TLSO sitting at EOB with mod assist OT Short Term Goal 4 (Week 1): Perform BADL with min vc to adhere to back precautions  Skilled Therapeutic Interventions/Progress Updates: 1:1 Pt received in w/c. Focus on toileting and LB dressing. Pt continues to become incontinent of urine with each sit to stand. Pt's brief very soiled.  Transfers to and from the toilet with BSC over the toilet with RW with min A. Pt able to recall the proper hand placement with extra time. Focus on peri hygiene - only able to perform front hygiene, required total A for posterior. Focus on practicing LB dressing with AE with extra time and instructional cues. Pt able to thread pants and don slipper shoes with reacher with extra time. Left in w/c in prep for next session.        Therapy Documentation Precautions:  Precautions Precautions: Back, Fall Precaution Booklet Issued: No Precaution Comments: Pt unable to recall; requires cues for functional adherence Required Braces or Orthoses: Spinal Brace Spinal Brace: Thoracolumbosacral orthotic, Applied in sitting position Restrictions Weight Bearing Restrictions: No    Pain: Pain Assessment Pain Assessment: 0-10 Pain Score: 1  Pain Type: Acute pain Pain Location: Back Pain Orientation: Mid;Lower Pain Descriptors / Indicators: Aching Pain Frequency: Constant Pain Onset: On-going Patients Stated Pain Goal: 2 Pain Intervention(s): Medication (See eMAR) Multiple Pain Sites: Yes  See Function Navigator for  Current Functional Status.   Therapy/Group: Individual Therapy  Willeen Cass [facility] 05/20/2016, 2:24 PM

## 2016-05-20 NOTE — Progress Notes (Signed)
Occupational Therapy Session Note  Patient Details  Name: [name] MRN: [id number] Date of Birth: February 08, 1961  Today's Date: 05/20/2016 OT Individual Time: [id number] Individual Time Calculation (min): 60 min    Short Term Goals: Week 1:  OT Short Term Goal 1 (Week 1): Complete bathing at shower level with mod assist using AE/DME, PRN OT Short Term Goal 2 (Week 1): Complete dressing using AE with mod assist  OT Short Term Goal 3 (Week 1): Don TLSO sitting at EOB with mod assist OT Short Term Goal 4 (Week 1): Perform BADL with min vc to adhere to back precautions  Skilled Therapeutic Interventions/Progress Updates:    Pt seen for OT ADL bathing/dressing session. Pt in supine upon arrival, voicing 8/10 pain all over, however, reports being pre-medicated prior to tx session. Pt eager to get to toilet to attempt BM. He transferred to EOB with close supervision and verbal/ tactile cues for log rolling technique.  Throughout session, He completed min A stand pivot transfers with VCs for hand placement and RW management. Pt unable to void once on toilet. He bathed seated in w/c at sink. Introduced to long handled sponge for LB dressing tasks. Pt stood with min A to RW for buttock hygiene and clothing management to be completed total A as pt uncomfortable of letting go of UE support from RW at this time. Pants and socks donned total A due to time. Pt left seated in w/c at end of session, all needs in reach.  Throughout session educated regarding DME, AE, POC, and d/c planning Pt with incontinent urination throughout session, voiding small amounts every time he strained/ with mobility.   Therapy Documentation Precautions:  Precautions Precautions: Back, Fall Precaution Booklet Issued: No Precaution Comments: Pt unable to recall; requires cues for functional adherence Required Braces or Orthoses: Spinal Brace Spinal Brace: Thoracolumbosacral orthotic, Applied in sitting  position Restrictions Weight Bearing Restrictions: No ADL: ADL ADL Comments: See Functional Assessment Tool Exercises:   Other Treatments:    See Function Navigator for Current Functional Status.   Therapy/Group: Individual Therapy  [name], Kamden Reber C 05/20/2016, 7:02 AM

## 2016-05-20 NOTE — Plan of Care (Signed)
Problem: RH Car Transfers Goal: LTG Patient will perform car transfers with assist (PT) LTG: Patient will perform car transfers with assistance (PT).  Upgraded d/t family inability to physically assist

## 2016-05-20 NOTE — Progress Notes (Signed)
Pt refused wound care/dressing change after several attempts made. Pt states he requires a bedside commode and refused to use toilet.

## 2016-05-21 ENCOUNTER — Inpatient Hospital Stay (HOSPITAL_COMMUNITY): Payer: Medicaid Other | Admitting: Physical Therapy

## 2016-05-21 ENCOUNTER — Inpatient Hospital Stay (HOSPITAL_COMMUNITY): Payer: Medicaid Other | Admitting: Occupational Therapy

## 2016-05-21 ENCOUNTER — Ambulatory Visit: Payer: Medicaid Other

## 2016-05-21 ENCOUNTER — Inpatient Hospital Stay (HOSPITAL_COMMUNITY): Payer: Medicaid Other | Admitting: *Deleted

## 2016-05-21 ENCOUNTER — Inpatient Hospital Stay (HOSPITAL_COMMUNITY): Payer: Medicaid Other

## 2016-05-21 DIAGNOSIS — C61 Malignant neoplasm of prostate: Secondary | ICD-10-CM

## 2016-05-21 LAB — CBC
HCT: 29 % — ABNORMAL LOW (ref 39.0–52.0)
Hemoglobin: 9.8 g/dL — ABNORMAL LOW (ref 13.0–17.0)
MCH: 30.6 pg (ref 26.0–34.0)
MCHC: 33.8 g/dL (ref 30.0–36.0)
MCV: 90.6 fL (ref 78.0–100.0)
PLATELETS: 297 10*3/uL (ref 150–400)
RBC: 3.2 MIL/uL — ABNORMAL LOW (ref 4.22–5.81)
RDW: 15.5 % (ref 11.5–15.5)
WBC: 13.4 10*3/uL — ABNORMAL HIGH (ref 4.0–10.5)

## 2016-05-21 MED ORDER — ENOXAPARIN SODIUM 40 MG/0.4ML ~~LOC~~ SOLN
40.0000 mg | SUBCUTANEOUS | Status: DC
  Administered 2016-05-21 – 2016-06-03 (×14): 40 mg via SUBCUTANEOUS
  Filled 2016-05-21 (×13): qty 0.4

## 2016-05-21 NOTE — Progress Notes (Signed)
Physical Therapy Session Note  Patient Details  Name: [name] MRN: [id number] Date of Birth: 1961/04/09  Today's Date: 05/21/2016 PT Individual Time: 0800-0900 and [id number] PT Individual Time Calculation (min): 60 min and 30 min (total 90 min)  Short Term Goals: Week 1:  PT Short Term Goal 1 (Week 1): Pt will be able to perform basic transfers with mod assist PT Short Term Goal 2 (Week 1): Pt will be able to gait x 25' with mod assist PT Short Term Goal 3 (Week 1): Pt will be able to do 4 stairs with mod assist  Skilled Therapeutic Interventions/Progress Updates: Tx 1: Pt received supine in bed, c/o pain in BLE and spine 6/10 with RN present at start of session to administer pain medication. Supine>sit with logroll, bedrails and S with increased time. Dons shirt with setupA on EOB; requires minA for donning TLSO with max cues for brace alignment and positioning with pt attempting to put arms through chest opening. Stand pivot transfer with min guard and RW to w/c. Upon standing to transfer pt voids 450 mL into condom catheter; no awareness he needed to urinate until he was going. Seated in w/c, B non-skid socks doffed with reacher, BLE socks donned with adaptive equipment and minA with increased time. Catheter bag thread through pants totalA; pt then dons pants with reacher with S for threading legs, minA for pulling up pants. Pt voids again smaller amount when standing to don pants. Seated at sink in w/c pt performs grooming with setupA. Remained seated in w/c at end of session, all needs in reach.   Tx 2: Pt received seated in w/c, denies pain but does report extreme LE fatigue due to previous therapy sessions. W/c propulsion to gym with modI for UE strengthening and endurance. Performed UE ergometer 7 min forward 7 min backward on level 6 for strengthening and endurance. Returned to room totalA for energy conservation. Remained seated in w/c at end of session, all needs in reach.        Therapy Documentation Precautions:  Precautions Precautions: Back, Fall Precaution Booklet Issued: No Precaution Comments: Pt unable to recall; requires cues for functional adherence Required Braces or Orthoses: Spinal Brace Spinal Brace: Thoracolumbosacral orthotic, Applied in sitting position Restrictions Weight Bearing Restrictions: No Pain: Pain Assessment Pain Assessment: 0-10 Pain Score: 7  Pain Type: Acute pain Pain Location: Generalized Pain Orientation: Right;Left Pain Descriptors / Indicators: Constant Pain Frequency: Intermittent Pain Onset: On-going Patients Stated Pain Goal: 0 Pain Intervention(s): Medication (See eMAR) Multiple Pain Sites: Yes   See Function Navigator for Current Functional Status.   Therapy/Group: Individual Therapy  Luberta Mutter 05/21/2016, 8:58 AM

## 2016-05-21 NOTE — Progress Notes (Signed)
Occupational Therapy Session Note  Patient Details  Name: [name] MRN: [id number] Date of Birth: 21-Nov-1960  Today's Date: 05/21/2016 OT Individual Time: 0930-1030 and 1300-1345 OT Individual Time Calculation (min): 60 min and 45 min   Short Term Goals: Week 1:  OT Short Term Goal 1 (Week 1): Complete bathing at shower level with mod assist using AE/DME, PRN OT Short Term Goal 2 (Week 1): Complete dressing using AE with mod assist  OT Short Term Goal 3 (Week 1): Don TLSO sitting at EOB with mod assist OT Short Term Goal 4 (Week 1): Perform BADL with min vc to adhere to back precautions  Skilled Therapeutic Interventions/Progress Updates:    Session One: Pt seen for OT session focusing on sit <> stands, standing tolerance and balance. Pt sitting up in w/c upon arrival, voicing increased fatigue and pain- pre-medicated prior to tx session. Willing to attempt therapy.  He self propelled w/c to therapy gym for UE strengthening, VCs for effective propulsion technique.  Mod A required for stand pivot transfer to mat due to fatigue. PT voicing increased discomfort in thighs and back, thinking sorenss from yesterday's tx session.  Initially requiring mod A +2 to stand, progressed to min-mod A +1 on subsequent trials. Worked on pt letting go of RW for support in standing in prep for standing ADL tasks. He was able to alternate UE support with steadying assist from therapist. Verbal cues for upright posture. Pt tolerating ~30 seconds each standing trial before requiring seated rest break. Attempted one trial without UE support, however, pt unable to maintain upright position without UE support.  Min A stand pivot completed back to w/c at end of session and pt returned to room. Left seated with all needs in reach.   Session Two: Pt seen for OT session focusing on stretching of LEs for pain management and activity tolerance. Pt sitting up in w/c upon arrival, cont with complaints of soreness and  fatigue. Reports nurse had just administered medication. He self propelled w/c to therapy gym with increased time. Pt desiring to work on [employer]. Utilized NuStep for LE stretching, holding flexed and extended position and hip abduction for stretch. Pt able to only tolerate level 1 resistance at this time due to soreness. Mod A with increased time for stand pivot back to chair.  Pt returned to room at end of session, left with all needs in reach.   Therapy Documentation Precautions:  Precautions Precautions: Back, Fall Precaution Booklet Issued: No Precaution Comments: Pt unable to recall; requires cues for functional adherence Required Braces or Orthoses: Spinal Brace Spinal Brace: Thoracolumbosacral orthotic, Applied in sitting position Restrictions Weight Bearing Restrictions: No ADL: ADL ADL Comments: See Functional Assessment Tool  See Function Navigator for Current Functional Status.   Therapy/Group: Individual Therapy  [name], Aaminah Forrester C 05/21/2016, 7:13 AM

## 2016-05-21 NOTE — Progress Notes (Signed)
Occupational Therapy Note  Patient Details  Name: [name] MRN: [id number] Date of Birth: [birth year]  Today's Date: 05/21/2016 OT Individual Time: 1100-1200 OT Individual Time Calculation (min): 60 min   Pt c/o 10/10 pain in lower back and BLE (hip flexors and hamstrings); RN aware Individual Therapy  Pt resting in w/c upon arrival with RN present.  Pt stated he was sore from previous days therapy.  Provided emotional support and educated patient on process.  Pt engaged in continued discharge planning and sit<>stand from w/c with max A.  Pt also engaged in BLE exercises while seated.  Pt remained in w/c with all needs within reach.  Pt apologized for not being able to do as well this day.   Leotis Shames [facility] 05/21/2016, 2:59 PM
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
## 2016-05-21 NOTE — Progress Notes (Signed)
[facility]ICAL MEDICINE [facility] REHABILITATION     PROGRESS NOTE    Subjective/Complaints:  Emptying bladder. Had one incontinent episode. Moving bowels. Still having struggles making it through therapies due to pain.   Review of Systems  Constitutional: Negative for fever.  Eyes: Negative for double vision.  Gastrointestinal: Negative for abdominal pain.  Genitourinary: Positive for frequency and urgency.  Musculoskeletal: Positive for back pain, joint pain and myalgias.  Neurological: Positive for sensory change. Negative for dizziness and headaches.  Psychiatric/Behavioral: Negative for depression.        Objective: Vital Signs: Blood pressure 112/76, pulse 64, temperature 98.9 F (37.2 C), temperature source Oral, resp. rate 18, height 5\' 10"  (1.778 m), weight 92.4 kg (203 lb 12.8 oz), SpO2 96 %. No results found. No results for input(s): WBC, HGB, HCT, PLT in the last 72 hours. No results for input(s): NA, K, CL, GLUCOSE, BUN, CREATININE, CALCIUM in the last 72 hours.  Invalid input(s): CO CBG (last 3)  No results for input(s): GLUCAP in the last 72 hours.  Wt Readings from Last 3 Encounters:  05/16/16 92.4 kg (203 lb 12.8 oz)  05/10/16 100.6 kg (221 lb 11.2 oz)  05/08/16 96.6 kg (213 lb)    Physical Exam:  Constitutional: He is oriented to person, place, and time. He appears well-developedand well-nourished.  No distress  HENT:  Head: Normocephalicand atraumatic.  Eyes: conjunctivae injected/some blood.   Neck: Normal range of motion. Neck supple. No tracheal deviationpresent. No thyromegalypresent.  Cardiovascular: RRR Respiratory:  Normal effort. Clear.  GI: Soft. Bowel sounds are normal. He exhibits no distension. There is no tenderness.  Musculoskeletal: tr  edema bilateral LE's.  Neurological: He is alertand oriented to person, place, and time.  UE motor nearly 5/5 with ongoing pain inhibition due to back pain. LE: 3-/5 HF, 3/5 KE and 3 to 3+/5  bilateral ADF/PF with an element of pain inhibition also. Decreased LT below chest although still can sense pain.    Skin: Skin is warmand dry.  Abrasions on legs/trunk all dry/scabbed/healing Psych: pleasant and cooperative. A little anxious  Assessment/Plan: 1. Weakness, functional deficits secondary to T12 burst fracture/polytrauma with paraplegia which require 3+ hours per day of interdisciplinary therapy in a comprehensive inpatient rehab setting. Physiatrist is providing close team supervision and 24 hour management of active medical problems listed below. Physiatrist and rehab team continue to assess barriers to discharge/monitor patient progress toward functional and medical goals.  Function:  Bathing Bathing position   Position: Wheelchair/chair at sink  Bathing parts Body parts bathed by patient: Right arm, Left arm, Chest, Abdomen, Front perineal area, Right upper leg, Left upper leg, Right lower leg, Left lower leg Body parts bathed by helper: Buttocks, Back  Bathing assist Assist Level: Touching or steadying assistance(Pt > 75%) (LH sponge)      Upper Body Dressing/Undressing Upper body dressing   What is the patient wearing?: Pull over shirt/dress, Orthosis     Pull over shirt/dress - Perfomed by patient: Thread/unthread right sleeve, Thread/unthread left sleeve, Put head through opening, Pull shirt over trunk Pull over shirt/dress - Perfomed by helper: Pull shirt over trunk     Orthosis activity level: Performed by helper  Upper body assist Assist Level: Set up      Lower Body Dressing/Undressing Lower body dressing   What is the patient wearing?: Socks, Pants       Pants- Performed by helper: Thread/unthread right pants leg, Thread/unthread left pants leg, Pull pants up/down Non-skid  slipper socks- Performed by patient: Don/doff right sock, Don/doff left sock Non-skid slipper socks- Performed by helper: Don/doff right sock, Don/doff left sock   Socks -  Performed by helper: Don/doff right sock              Lower body assist Assist for lower body dressing: Assistive device, Set up, Supervision or verbal cues Assistive Device Comment: sock aid Set up : To obtain clothing/put away  Toileting Toileting   Toileting steps completed by patient: Adjust clothing prior to toileting, Performs perineal hygiene, Adjust clothing after toileting (per Elmo Putt, NT report) Toileting steps completed by helper: Adjust clothing prior to toileting, Adjust clothing after toileting (did not void)    Toileting assist Assist level:  (Total assist)   Transfers Chair/bed transfer   Chair/bed transfer method: Stand pivot Chair/bed transfer assist level: Touching or steadying assistance (Pt > 75%) Chair/bed transfer assistive device: Walker, Air cabin crew     Max distance: 30 Assist level: Touching or steadying assistance (Pt > 75%)   Wheelchair   Type: Manual Max wheelchair distance: 125 Assist Level: Supervision or verbal cues  Cognition Comprehension Comprehension assist level: Understands basic 90% of the time/cues < 10% of the time  Expression Expression assist level: Expresses basic 90% of the time/requires cueing < 10% of the time.  Social Interaction Social Interaction assist level: Interacts appropriately 75 - 89% of the time - Needs redirection for appropriate language or to initiate interaction.  Problem Solving Problem solving assist level: Solves basic 50 - 74% of the time/requires cueing 25 - 49% of the time  Memory Memory assist level: Recognizes or recalls 75 - 89% of the time/requires cueing 10 - 24% of the time   Medical Problem List and Plan: 1. Functional and mobility deficitssecondary to multiple spinal fractures including T12 Burst fracture with incomplete paraplegia -continue CIR therapies 2. DVT Prophylaxis/Anticoagulation: Mechanical: Sequential compression devices, below knee  Bilateral lower extremities  -will add sq lovenox 40 daily  -hold if any signs of hematuria/bleeding 3. Pain Management: Continue Oxy IR with scheduled robaxin and ultram. oxycontin also seems to be helping as well---increased to 30 q12  - Decreased ultram due to liver disease.   -continue Local measures with heat/ice also.   -lyrica for neuropathy effective.  4. Mood: LCSW to follow for evaluation and support.  5. Neuropsych: This patient iscapable of making decisions on hisown behalf. 6. Skin/Wound Care: Routine pressure relief measures.  7. Fluids/Electrolytes/Nutrition: Monitor I/O.   -sodium only 132--re-check Thursday  -encourage PO  8. Prostate cancer: Was undergoing weekly XRT PTA.  9. Bladder neck contracture with retention: Continue foley for now--voiding trial next week>? 10. Mild corneal abrasions OD: Poor vision right eye. Showing some improvement 11. ABLA: check hgb tomorrow.      12. Cirrhosis of liver: Thrombocytopenia resolving. Reports that he was onxifamin and lactulose but was not taking as prescribed--managed by Dr. Watt Climes. Requested to have family bring in meds.   -ammonia level 34  13. Bilateral lung contusions with effusions: reiterated IS to help with SOB/mobilize secretions.  14. Hematuria/bladder: urine cleared after flushing foley.   -continue voiding trial  -condom cath to help keep him dry  -recent ua/ucx specimen negative   LOS (Days) 5 A FACE TO FACE EVALUATION WAS PERFORMED  Meredith Staggers, MD 05/21/2016 9:02 AM

## 2016-05-22 ENCOUNTER — Inpatient Hospital Stay (HOSPITAL_COMMUNITY): Payer: Medicaid Other | Admitting: Occupational Therapy

## 2016-05-22 ENCOUNTER — Ambulatory Visit: Payer: Medicaid Other

## 2016-05-22 ENCOUNTER — Inpatient Hospital Stay (HOSPITAL_COMMUNITY): Payer: Medicaid Other | Admitting: Physical Therapy

## 2016-05-22 DIAGNOSIS — F419 Anxiety disorder, unspecified: Secondary | ICD-10-CM

## 2016-05-22 DIAGNOSIS — F329 Major depressive disorder, single episode, unspecified: Secondary | ICD-10-CM

## 2016-05-22 LAB — URINALYSIS, ROUTINE W REFLEX MICROSCOPIC
Glucose, UA: NEGATIVE mg/dL
Ketones, ur: NEGATIVE mg/dL
Nitrite: POSITIVE — AB
PH: 5 (ref 5.0–8.0)
Protein, ur: 30 mg/dL — AB
SPECIFIC GRAVITY, URINE: 1.026 (ref 1.005–1.030)

## 2016-05-22 LAB — BASIC METABOLIC PANEL
Anion gap: 10 (ref 5–15)
BUN: 19 mg/dL (ref 6–20)
CO2: 25 mmol/L (ref 22–32)
CREATININE: 0.92 mg/dL (ref 0.61–1.24)
Calcium: 8.5 mg/dL — ABNORMAL LOW (ref 8.9–10.3)
Chloride: 93 mmol/L — ABNORMAL LOW (ref 101–111)
GFR calc Af Amer: >60 mL/min (ref >60)
Glucose, Bld: 121 mg/dL — ABNORMAL HIGH (ref 65–99)
POTASSIUM: 3.8 mmol/L (ref 3.5–5.1)
SODIUM: 128 mmol/L — AB (ref 135–145)
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
## 2016-05-22 NOTE — Progress Notes (Signed)
Physical Therapy Session Note  Patient Details  Name: [name] MRN: [id number] Date of Birth: 20-Jun-1960  Today's Date: 05/22/2016 PT Individual Time: [id number] PT Individual Time Calculation (min): 68 min   Short Term Goals: Week 1:  PT Short Term Goal 1 (Week 1): Pt will be able to perform basic transfers with mod assist PT Short Term Goal 2 (Week 1): Pt will be able to gait x 25' with mod assist PT Short Term Goal 3 (Week 1): Pt will be able to do 4 stairs with mod assist  Skilled Therapeutic Interventions/Progress Updates:   Patient in wheelchair with visitors departing. Patient propelled wheelchair to gym using BUE with mod I and increased time. Performed kinetron from wheelchair level at 80 cm/sec, 2 trials of 4 min each for BLE neuro re-ed and activity tolerance. Sit <> stands using RW with min A and max verbal cues to control descent with at least one hand and patient unable to remove BUE from RW on each attempt. Gait training using RW 30 ft + 20 ft + 50 ft with min A overall. Switched out wheelchair cushion for [employer] and adjusted to fit patient for improved sitting tolerance and pressure relief. Patient left sitting in wheelchair with all needs within reach.   Therapy Documentation Precautions:  Precautions Precautions: Back, Fall Precaution Booklet Issued: No Precaution Comments: Pt unable to recall; requires cues for functional adherence Required Braces or Orthoses: Spinal Brace Spinal Brace: Thoracolumbosacral orthotic, Applied in sitting position Restrictions Weight Bearing Restrictions: No Pain: Pain Assessment Pain Assessment: 0-10 Pain Score: 8  Pain Type: Acute pain Pain Location: Leg Pain Orientation: Right;Left;Posterior Pain Descriptors / Indicators: Aching;Constant Pain Onset: On-going Pain Intervention(s): Repositioned;Rest   See Function Navigator for Current Functional Status.   Therapy/Group: Individual Therapy  Malakhi Markwood, Murray Hodgkins 05/22/2016, 4:31 PM

## 2016-05-22 NOTE — Progress Notes (Signed)
Occupational Therapy Session Note  Patient Details  Name: [name] MRN: [id number] Date of Birth: 1960/10/28  Today's Date: 05/22/2016 OT Individual Time: 1030-1130 OT Individual Time Calculation (min): 60 min    Short Term Goals: Week 1:  OT Short Term Goal 1 (Week 1): Complete bathing at shower level with mod assist using AE/DME, PRN OT Short Term Goal 2 (Week 1): Complete dressing using AE with mod assist  OT Short Term Goal 3 (Week 1): Don TLSO sitting at EOB with mod assist OT Short Term Goal 4 (Week 1): Perform BADL with min vc to adhere to back precautions  Skilled Therapeutic Interventions/Progress Updates:    Pt seen for OT session focusing on w/c propulsion for UE strengtheing/ activity tolerance.  Pt sitting up in w/c upon arrival, voicing desire to go visit girlfriend on other unit who is about to be transferred out of hospital. Pt self propelled w/c towards unit with supervision, requiring rest breaks throughout. While in girlfriends room, pt required VC to adhere to back pre-caution as pt twisting in chair to reach towards girlfriend. Pt able to recall 1/3 back pre-cautions. He then stood in order to reach girlfriend to give her hug, VCs for RW management.  Pt returned to room at end of session, left seated in chair with all needs in reach.   Therapy Documentation Precautions:  Precautions Precautions: Back, Fall Precaution Booklet Issued: No Precaution Comments: Pt unable to recall; requires cues for functional adherence Required Braces or Orthoses: Spinal Brace Spinal Brace: Thoracolumbosacral orthotic, Applied in sitting position Restrictions Weight Bearing Restrictions: No ADL: ADL ADL Comments: See Functional Assessment Tool  See Function Navigator for Current Functional Status.   Therapy/Group: Individual Therapy  [name], Rafe Mackowski C 05/22/2016, 7:14 AM

## 2016-05-22 NOTE — Progress Notes (Signed)
Recreational Therapy Session Note  Patient Details  Name: [name] MRN: [id number] Date of Birth: 14-Dec-1960 Today's Date: 05/22/2016  Pain: no c/o Skilled Therapeutic Interventions/Progress Updates: Met with pt today to discuss TR services and leisure interests.  Pt with complaints of being sore & tired from previous therapies including yesterdays sessions.  Pt not interested in adding more to his to do list at this time.  Will continue to monitor through team for participation in TR groups as appropriate.  [place] 05/22/2016, 9:58 AM
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
## 2016-05-22 NOTE — Progress Notes (Signed)
Physical Therapy Session Note  Patient Details  Name: [name] MRN: [id number] Date of Birth: 11/05/1960  Today's Date: 05/22/2016 PT Individual Time: 1300-1400 PT Individual Time Calculation (min): 60 min   Short Term Goals: Week 1:  PT Short Term Goal 1 (Week 1): Pt will be able to perform basic transfers with mod assist PT Short Term Goal 2 (Week 1): Pt will be able to gait x 25' with mod assist PT Short Term Goal 3 (Week 1): Pt will be able to do 4 stairs with mod assist  Skilled Therapeutic Interventions/Progress Updates: Pt received seated in w/c; c/o pain as below and agreeable to treatment. Pt voicing desire to go see girlfriend before she is discharged to SNF, as his sister has brought clothes up for her and he wants her to get them before she leaves. Pt propelled w/c 4 trials 100-150' per trial with S for strengthening and endurance; rest breaks as needed due to fatigue. Educated pt and family in rehab goals of S, estimated length of stay. Offered emotional support for pt and girlfriend and discussed how pt's girlfriends move to SNF can be used as motivation for pt to improve and go home so he will be able to go visit her. Pt remained seated in w/c at end of session, all needs in reach.      Therapy Documentation Precautions:  Precautions Precautions: Back, Fall Precaution Booklet Issued: No Precaution Comments: Pt unable to recall; requires cues for functional adherence Required Braces or Orthoses: Spinal Brace Spinal Brace: Thoracolumbosacral orthotic, Applied in sitting position Restrictions Weight Bearing Restrictions: No Pain: Pain Assessment Pain Assessment: 0-10 Pain Score: 8  Pain Type: Acute pain Pain Location: Leg Pain Orientation: Right;Left;Posterior Pain Descriptors / Indicators: Aching;Constant Pain Onset: On-going Pain Intervention(s): Repositioned;Rest   See Function Navigator for Current Functional Status.   Therapy/Group: Individual  Therapy  Luberta Mutter 05/22/2016, 4:39 PM

## 2016-05-22 NOTE — Progress Notes (Signed)
Social Work Patient ID: [name], male   DOB: 22-Apr-1961, 56 y.o.   MRN: [id number]   CSW met with pt and his sister, [name], to update them on team conference discussion and pt's targeted d/c date of 06-04-16.  Pt was accepting of this and sister was glad he would be here longer so that he can care for himself physically, as she is 54 y/o and her husband is older than she.  CSW explained that pt's goals are supervision and she stated they can provide that.  Sister fussed at pt for how he takes care of himself and he stated he finally realizes he needs to do a better job.  CSW will continue to follow and assist as needed.

## 2016-05-22 NOTE — Progress Notes (Signed)
Occupational Therapy Session Note  Patient Details  Name: [name] MRN: [id number] Date of Birth: 1960/10/02  Today's Date: 05/22/2016 OT Individual Time: 0900-1000 OT Individual Time Calculation (min): 60 min    Skilled Therapeutic Interventions/Progress Updates: 1:1 Self care retraining at shower level with focus on stand pivot transfers with RW, sit to stands, use of AE for LB bathing and dressing. Pt able to transfer with min to mod A with decr erect posture. Pt bathed with min A for feet. Pt did shower without TLSO donned- remained seated on BSC. Pt donned shirt and TLSO before transferring into the w/c . Completed dressing at w/c level. Pt with decr skin integrity on bottom and recommended wearing juts pants without brief today since he is wearing catheter for constant incontinence. Left at sink to perform grooming.      Therapy Documentation Precautions:  Precautions Precautions: Back, Fall Precaution Booklet Issued: No Precaution Comments: Pt unable to recall; requires cues for functional adherence Required Braces or Orthoses: Spinal Brace Spinal Brace: Thoracolumbosacral orthotic, Applied in sitting position Restrictions Weight Bearing Restrictions: No Pain:  ongoing pain in back but able to continue See Function Navigator for Current Functional Status.   Therapy/Group: Individual Therapy  Willeen Cass [facility] 05/22/2016, 3:22 PM

## 2016-05-23 ENCOUNTER — Inpatient Hospital Stay (HOSPITAL_COMMUNITY): Payer: Medicaid Other | Admitting: Physical Therapy

## 2016-05-23 ENCOUNTER — Ambulatory Visit: Payer: Medicaid Other

## 2016-05-23 ENCOUNTER — Inpatient Hospital Stay (HOSPITAL_COMMUNITY): Payer: Medicaid Other

## 2016-05-23 ENCOUNTER — Inpatient Hospital Stay (HOSPITAL_COMMUNITY): Payer: Medicaid Other | Admitting: Occupational Therapy

## 2016-05-23 DIAGNOSIS — N390 Urinary tract infection, site not specified: Secondary | ICD-10-CM

## 2016-05-23 DIAGNOSIS — A499 Bacterial infection, unspecified: Secondary | ICD-10-CM

## 2016-05-23 LAB — BASIC METABOLIC PANEL
ANION GAP: 10 (ref 5–15)
BUN: 15 mg/dL (ref 6–20)
CALCIUM: 8.4 mg/dL — AB (ref 8.9–10.3)
CO2: 24 mmol/L (ref 22–32)
Chloride: 94 mmol/L — ABNORMAL LOW (ref 101–111)
Creatinine, Ser: 0.89 mg/dL (ref 0.61–1.24)
GLUCOSE: 104 mg/dL — AB (ref 65–99)
Potassium: 4.1 mmol/L (ref 3.5–5.1)
Sodium: 128 mmol/L — ABNORMAL LOW (ref 135–145)

## 2016-05-23 LAB — URINE CULTURE

## 2016-05-23 LAB — CBC
HEMATOCRIT: 25.4 % — AB (ref 39.0–52.0)
Hemoglobin: 8.4 g/dL — ABNORMAL LOW (ref 13.0–17.0)
MCH: 30.2 pg (ref 26.0–34.0)
MCHC: 33.1 g/dL (ref 30.0–36.0)
MCV: 91.4 fL (ref 78.0–100.0)
PLATELETS: 204 10*3/uL (ref 150–400)
RBC: 2.78 MIL/uL — AB (ref 4.22–5.81)
RDW: 15.5 % (ref 11.5–15.5)
WBC: 7.3 10*3/uL (ref 4.0–10.5)

## 2016-05-23 MED ORDER — COLLAGENASE 250 UNIT/GM EX OINT
TOPICAL_OINTMENT | Freq: Every day | CUTANEOUS | Status: DC
  Administered 2016-05-23 – 2016-05-27 (×5): via TOPICAL
  Filled 2016-05-23: qty 30

## 2016-05-23 MED ORDER — FUROSEMIDE 20 MG PO TABS
20.0000 mg | ORAL_TABLET | Freq: Every day | ORAL | Status: DC
  Administered 2016-05-24 – 2016-06-04 (×12): 20 mg via ORAL
  Filled 2016-05-23 (×12): qty 1

## 2016-05-23 MED ORDER — CEPHALEXIN 250 MG PO CAPS
250.0000 mg | ORAL_CAPSULE | Freq: Four times a day (QID) | ORAL | Status: DC
  Administered 2016-05-23 – 2016-05-27 (×16): 250 mg via ORAL
  Filled 2016-05-23 (×16): qty 1

## 2016-05-23 NOTE — Progress Notes (Signed)
[facility]     PROGRESS NOTE    Subjective/Complaints: Moving better with therapy. Pleased with progress yesterday. Wound still draining from scalp    Review of Systems  Constitutional: Negative for fever.  Eyes: Negative for double vision.  Cardiovascular: Negative for chest pain.  Gastrointestinal: Negative for abdominal pain.  Genitourinary: Positive for frequency and urgency.  Musculoskeletal: Positive for back pain, joint pain and myalgias.  Neurological: Positive for sensory change.  Psychiatric/Behavioral: Negative for depression.        Objective: Vital Signs: Blood pressure (!) 99/50, pulse 70, temperature 98.5 F (36.9 C), temperature source Oral, resp. rate 18, height 5\' 10"  (1.778 m), weight 92.4 kg (203 lb 12.8 oz), SpO2 94 %. No results found.  Recent Labs  05/21/16 1031 05/23/16 0714  WBC 13.4* 7.3  HGB 9.8* 8.4*  HCT 29.0* 25.4*  PLT 297 204    Recent Labs  05/22/16 0716 05/23/16 0714  NA 128* 128*  K 3.8 4.1  CL 93* 94*  GLUCOSE 121* 104*  BUN 19 15  CREATININE 0.92 0.89  CALCIUM 8.5* 8.4*   CBG (last 3)  No results for input(s): GLUCAP in the last 72 hours.  Wt Readings from Last 3 Encounters:  05/16/16 92.4 kg (203 lb 12.8 oz)  05/10/16 100.6 kg (221 lb 11.2 oz)  05/08/16 96.6 kg (213 lb)    Physical Exam:  Constitutional: He is oriented to person, place, and time. He appears well-developedand well-nourished.    HENT:  Head: Normocephalicand atraumatic.  Eyes: conjunctivae injected/some blood.   Neck: Normal range of motion. Neck supple. No tracheal deviationpresent. No thyromegalypresent.  Cardiovascular: RRR Respiratory:  Normal effort. Clear.  GI: Soft. Bowel sounds are normal. He exhibits no distension. There is no tenderness.  Musculoskeletal: tr  edema bilateral LE's.  Neurological: He is alertand oriented to person, place, and time.  UE motor nearly 5/5 with ongoing pain  inhibition due to back pain. LE: 3-/5 HF, 3/5 KE and 3 to 3+/5 bilateral ADF/PF with an element of pain inhibition also. Decreased LT below chest although still can sense pain.    Skin: scalp wound with area of fibronecrotic tissue     Abrasions on legs/trunk all dry/scabbed/healing gradually Psych: pleasant and cooperative. A little anxious  Assessment/Plan: 1. Weakness, functional deficits secondary to T12 burst fracture/polytrauma with paraplegia which require 3+ hours per day of interdisciplinary therapy in a comprehensive inpatient rehab setting. Physiatrist is providing close team supervision and 24 hour management of active medical problems listed below. Physiatrist and rehab team continue to assess barriers to discharge/monitor patient progress toward functional and medical goals.  Function:  Bathing Bathing position   Position: Wheelchair/chair at sink  Bathing parts Body parts bathed by patient: Right arm, Left arm, Chest, Abdomen, Front perineal area, Right upper leg, Left upper leg, Right lower leg, Left lower leg Body parts bathed by helper: Buttocks, Back  Bathing assist Assist Level: Touching or steadying assistance(Pt > 75%) (LH sponge)      Upper Body Dressing/Undressing Upper body dressing   What is the patient wearing?: Pull over shirt/dress, Orthosis     Pull over shirt/dress - Perfomed by patient: Thread/unthread right sleeve, Thread/unthread left sleeve, Put head through opening, Pull shirt over trunk Pull over shirt/dress - Perfomed by helper: Pull shirt over trunk     Orthosis activity level: Performed by helper  Upper body assist Assist Level: Set up      Lower Body Dressing/Undressing  Lower body dressing   What is the patient wearing?: Socks, Pants       Pants- Performed by helper: Thread/unthread right pants leg, Thread/unthread left pants leg, Pull pants up/down Non-skid slipper socks- Performed by patient: Don/doff right sock, Don/doff left  sock Non-skid slipper socks- Performed by helper: Don/doff right sock, Don/doff left sock   Socks - Performed by helper: Don/doff right sock              Lower body assist Assist for lower body dressing: Assistive device, Set up, Supervision or verbal cues Assistive Device Comment: sock aid Set up : To obtain clothing/put away  Toileting Toileting   Toileting steps completed by patient: Adjust clothing prior to toileting, Performs perineal hygiene, Adjust clothing after toileting Toileting steps completed by helper: Adjust clothing prior to toileting, Adjust clothing after toileting    Toileting assist Assist level:  (total assist)   Transfers Chair/bed transfer   Chair/bed transfer method: Stand pivot Chair/bed transfer assist level: Touching or steadying assistance (Pt > 75%) Chair/bed transfer assistive device: Armrests, Medical sales representative     Max distance: 50 ft Assist level: Touching or steadying assistance (Pt > 75%)   Wheelchair   Type: Manual Max wheelchair distance: 150 ft Assist Level: Supervision or verbal cues  Cognition Comprehension Comprehension assist level: Follows basic conversation/direction with no assist  Expression Expression assist level: Expresses basic needs/ideas: With no assist  Social Interaction Social Interaction assist level: Interacts appropriately 90% of the time - Needs monitoring or encouragement for participation or interaction.  Problem Solving Problem solving assist level: Solves basic 75 - 89% of the time/requires cueing 10 - 24% of the time  Memory Memory assist level: Recognizes or recalls 75 - 89% of the time/requires cueing 10 - 24% of the time   Medical Problem List and Plan: 1. Functional and mobility deficitssecondary to multiple spinal fractures including T12 Burst fracture with incomplete paraplegia -continue CIR therapies.  2. DVT Prophylaxis/Anticoagulation: Mechanical: Sequential  compression devices, below knee Bilateral lower extremities  -continue sq lovenox 40 daily  -hold if any signs of hematuria/bleeding 3. Pain Management: Continue Oxy IR with scheduled robaxin and ultram. oxycontin also seems to be helping as well---increased to 30 q12  - Decreased ultram due to liver disease.   -continue Local measures with heat/ice also.   -lyrica for neuropathy  .  4. Mood: LCSW to follow for evaluation and support.  5. Neuropsych: This patient iscapable of making decisions on hisown behalf. 6. Skin/Wound Care: Routine pressure relief measures.  7. Fluids/Electrolytes/Nutrition: Monitor I/O.   -sodium down to 128---holding at 128 today  -  lasix  -encourage PO  8. Prostate cancer: Was undergoing weekly XRT PTA.  9. Bladder neck contracture with retention: voiding trial./condom 10. Mild corneal abrasions OD: Poor vision right eye. Showing some improvement 11. ABLA: hgb 8.4 which is a decrease     12. Cirrhosis of liver: Thrombocytopenia resolving. Reports that he was onxifamin and lactulose but was not taking as prescribed--managed by Dr. Watt Climes. Requested to have family bring in meds.   -ammonia level 34  13. Bilateral lung contusions with effusions:continue IS to help with SOB/mobilize secretions.  14. Hematuria/bladder: no gross hematuria but moderate hgb in blood.   -continue voiding trial  -condom cath to help keep him dry  -abx as below 15. Leukocytosis:  -wbc's down to 7.3   -ua suspicious. Still with frequency.   -Will begin empiric keflex  LOS (Days) 7 A FACE TO FACE EVALUATION WAS PERFORMED  Meredith Staggers, MD 05/23/2016 9:49 AM

## 2016-05-23 NOTE — Progress Notes (Signed)
Physical Therapy Weekly Progress Note  Patient Details  Name: [name] MRN: [id number] Date of Birth: 12-23-1960  Beginning of progress report period: May 17, 2016 End of progress report period: May 23, 2016  Today's Date: 05/23/2016 PT Individual Time: 1030-1100 and 1500-1600 PT Individual Time Calculation (min): 30 min and 60 min (total 90 min)   and Today's Date: 05/23/2016 PT Concurrent Time: 1000-1030 PT Concurrent Time Calculation (min): 30 min  Patient has met 3 of 3 short term goals.  Pt currently requires minA overall for bed mobility, transfers, gait and stairs due to LE strength deficits. Patient is primarily limited by pain and decreased endurance. Pt continuing to make slow but steady progress towards long term goals; demonstrates motivation to achieve goals and return home at S level.   Patient continues to demonstrate the following deficits muscle weakness, decreased cardiorespiratoy endurance and decreased standing balance, decreased postural control, decreased balance strategies and difficulty maintaining precautions and therefore will continue to benefit from skilled PT intervention to increase functional independence with mobility.  Patient progressing toward long term goals..  Continue plan of care.  PT Short Term Goals Week 1:  PT Short Term Goal 1 (Week 1): Pt will be able to perform basic transfers with mod assist PT Short Term Goal 1 - Progress (Week 1): Met PT Short Term Goal 2 (Week 1): Pt will be able to gait x 25' with mod assist PT Short Term Goal 2 - Progress (Week 1): Met PT Short Term Goal 3 (Week 1): Pt will be able to do 4 stairs with mod assist PT Short Term Goal 3 - Progress (Week 1): Met Week 2:  PT Short Term Goal 1 (Week 2): Pt will perform bed mobility modI PT Short Term Goal 2 (Week 2): Pt will perform stand pivot transfers with consistent S PT Short Term Goal 3 (Week 2): Pt will ambulate x50' with min guard PT Short Term Goal 4  (Week 2): Pt will ascend/descend 1+1 curb step with RW and minA  Skilled Therapeutic Interventions/Progress Updates: Tx 1: Pt received seated in w/c, c/o pain as described below and agreeable to treatment. W/c propulsion to gym modI with BUE for strengthening and endurance. UE ergometer x5 min forward, x5 min backward. Ascent/descent 8 3" height stairs with B handrails and minA. Pt fatigued toward end of session, reports significant pain on last step to floor before sitting. Requires extended seated rest break before next activity. Gait x30' with RW and min guard. Remained seated in w/c at end of session, all needs in reach.   Tx 2: Pt received seated in w/c, c/o pain 8/10 in low bcak and agreeable to treatment. Pt voicing frustration of dealing with girlfriends medical situation; offered emotional support and encouraged pt to use therapy time to get his mind off her and focus on his own recovery. W/c propulsion to gym for UE strengthening and endurance. Kinetron x8 min with BLE for glute/hamstring strengthening and endurance. Stand pivot transfer w/c >mat table with close S. Sit <>stand 2x5 reps from edge of mat table with RW and S. Gait x60' with RW and min guard. Remained seated in w/c at end of session, all needs in reach.      Therapy Documentation Precautions:  Precautions Precautions: Back, Fall Precaution Booklet Issued: No Precaution Comments: Pt unable to recall; requires cues for functional adherence Required Braces or Orthoses: Spinal Brace Spinal Brace: Thoracolumbosacral orthotic, Applied in sitting position Restrictions Weight Bearing Restrictions: No  See Function Navigator for Current Functional Status.  Therapy/Group: Individual Therapy and Concurrent  Luberta Mutter 05/23/2016, 10:56 AM

## 2016-05-23 NOTE — Progress Notes (Signed)
Physical Therapy Note  Patient Details  Name: [name] MRN: [id number] Date of Birth: December 31, 1960 Today's Date: 05/23/2016  0800-0900, 60 min individual tx Pain:8/10; premedicated  Pt noted to have moderate edema bil LEs, LLE > RLE.  PT spoke with Audrea Muscat, RN, and donned TEDS on pt.  Rolling R and sitting up EOB with use of rail, min assist for LLE.  Pt sat EOB while TLSO donned with assistance.  Stand pivot with RW, min assist.  Seated neuromuscular re-education via multimodal cues for 15 x 1 each R/L hip flexion, R/L long arc quad knee ext, bil hip adduction with 5 second hold, bil ankle,  DFbil ankle PF. Pt left resting in w/c with all needs within reach.   See function navigator for current status  [name] 05/23/2016, 7:55 AM

## 2016-05-23 NOTE — Progress Notes (Addendum)
Occupational Therapy Session Note  Patient Details  Name: [name] MRN: [id number] Date of Birth: Feb 06, 1961  Today's Date: 05/23/2016 OT Individual Time: 11:00-12:00     Skilled Therapeutic Interventions/Progress Updates:    Upon entering room, Pt sitting in w/c and agreable to tx. Pt gathers clothes from closet at w/c level and wheels into bathroom. Pt requires CGA-MIN A for sit to stand and stand pivot transfer <> shower chair with VC for safety awareness to lock breaks and push to stand from armrests. Pt don/doff pants with A from therapist to advance pants over hips as client stands holding onto walker and requires VC to use reacher to thread BLE. While seated on shower chair, Pt use long handled shower sponge to wash BLE and back. Pt req MOD A and VC to don TLSO after showering without twisting while seated in shower chair.   Pt reports wanting to clean up room, and encouraged to use reacher to pick items up off the floor and at shoulder level and to retrieve a cup from nurses station for a drink. Pt left in room with call light in reach.   Therapy Documentation Precautions:  Precautions Precautions: Back, Fall Precaution Booklet Issued: No Precaution Comments: Pt unable to recall; requires cues for functional adherence Required Braces or Orthoses: Spinal Brace Spinal Brace: Thoracolumbosacral orthotic, Applied in sitting position Restrictions Weight Bearing Restrictions: No ADL: ADL ADL Comments: See Functional Assessment Tool  See Function Navigator for Current Functional Status.   Therapy/Group: Individual Therapy  Tonny Branch 05/23/2016, 1:16 PM

## 2016-05-24 ENCOUNTER — Inpatient Hospital Stay (HOSPITAL_COMMUNITY): Payer: Medicaid Other

## 2016-05-24 MED ORDER — FLEET ENEMA 7-19 GM/118ML RE ENEM
1.0000 | ENEMA | Freq: Every day | RECTAL | Status: DC | PRN
  Administered 2016-05-24: 1 via RECTAL
  Filled 2016-05-24: qty 1

## 2016-05-24 MED ORDER — LINACLOTIDE 145 MCG PO CAPS
290.0000 ug | ORAL_CAPSULE | Freq: Every day | ORAL | Status: DC
  Administered 2016-05-25 – 2016-06-04 (×11): 290 ug via ORAL
  Filled 2016-05-24 (×2): qty 1
  Filled 2016-05-24: qty 2
  Filled 2016-05-24: qty 1
  Filled 2016-05-24: qty 2
  Filled 2016-05-24: qty 1
  Filled 2016-05-24: qty 2
  Filled 2016-05-24 (×2): qty 1
  Filled 2016-05-24 (×5): qty 2

## 2016-05-24 NOTE — Progress Notes (Signed)
[name] is a 56 y.o. male [birth year] [id number]  Subjective: C/o constipation, LBP  Objective: Vital signs in last 24 hours: Temp:  [97.9 F (36.6 C)-98.2 F (36.8 C)] 98.2 F (36.8 C) (01/27 0508) Pulse Rate:  [69-85] 85 (01/27 0508) Resp:  [18] 18 (01/27 0508) BP: (117-135)/(60-65) 117/65 (01/27 0508) SpO2:  [97 %-100 %] 97 % (01/27 0508) Weight change:  Last BM Date: 05/20/16  Intake/Output from previous day: 01/26 0701 - 01/27 0700 In: 1220 [P.O.:1220] Out: 3150 [Urine:3150] Last cbgs: CBG (last 3)  No results for input(s): GLUCAP in the last 72 hours.   Physical Exam General: No apparent distress   HEENT: not dry Lungs: Normal effort. Lungs clear to auscultation, no crackles or wheezes. Cardiovascular: Regular rate and rhythm, no edema Abdomen: S/NT/distended; BS(+) Musculoskeletal:  unchanged Neurological: No new neurological deficits Wounds: clear  Skin: clear   Mental state: Alert, oriented, cooperative    Lab Results: BMET    Component Value Date/Time   NA 128 (L) 05/23/2016 0714   NA 131 (L) 10/28/2013 0806   K 4.1 05/23/2016 0714   K 3.8 10/28/2013 0806   CL 94 (L) 05/23/2016 0714   CL 98 10/28/2013 0806   CO2 24 05/23/2016 0714   CO2 27 10/28/2013 0806   GLUCOSE 104 (H) 05/23/2016 0714   GLUCOSE 157 (H) 10/28/2013 0806   BUN 15 05/23/2016 0714   BUN 7 10/28/2013 0806   CREATININE 0.89 05/23/2016 0714   CREATININE 1.28 10/28/2013 0806   CALCIUM 8.4 (L) 05/23/2016 0714   CALCIUM 9.3 10/28/2013 0806   GFRNONAA >60 05/23/2016 0714   GFRNONAA >60 10/28/2013 0806   GFRAA >60 05/23/2016 0714   GFRAA >60 10/28/2013 0806   CBC    Component Value Date/Time   WBC 7.3 05/23/2016 0714   RBC 2.78 (L) 05/23/2016 0714   HGB 8.4 (L) 05/23/2016 0714   HGB 12.0 (L) 05/01/2016 1552   HCT 25.4 (L) 05/23/2016 0714   HCT 35.5 (L) 05/01/2016 1552   PLT 204 05/23/2016 0714   PLT 149 05/01/2016 1552   MCV 91.4 05/23/2016 0714   MCV 92.2  05/01/2016 1552   MCH 30.2 05/23/2016 0714   MCHC 33.1 05/23/2016 0714   RDW 15.5 05/23/2016 0714   RDW 15.2 (H) 05/01/2016 1552   LYMPHSABS 1.0 05/17/2016 0640   LYMPHSABS 1.0 05/01/2016 1552   MONOABS 0.7 05/17/2016 0640   MONOABS 0.7 05/01/2016 1552   EOSABS 0.3 05/17/2016 0640   EOSABS 0.3 05/01/2016 1552   EOSABS 0.3 05/12/2012 1147   BASOSABS 0.0 05/17/2016 0640   BASOSABS 0.0 05/01/2016 1552    Studies/Results: No results found.  Medications: I have reviewed the patient's current medications.  Assessment/Plan:  1. T12 fx with incomplete paraplegia -continue CIR therapies.  2. DVT proph           -Lovenox 3. LBP: Oxy IR         4. Depression: LCSW to follow for evaluation and support.  5. Neuropsych: This patient iscapable of making decisions on hisown behalf. 6. Skin/Wound Care: Routine pressure relief measures.  7. Fluids/Electrolytes/Nutrition: Monitor I/O.            -sodium down to 128---holding at 128 today           -  lasix           -encourage PO  8. Prostate cancer: When better - restarting XRT PTA.  9. Bladder neck contracture with retention: voiding trial./condom  cath 10. Mild corneal abrasions OD: Poor vision right eye. Showing some improvement 11. ABLA: hgb 8.4 which is a decrease     12. Cirrhosis of liver: Thrombocytopenia resolving. Reports that he was onxifamin and lactulose but was not taking as prescribed--managed by Dr. Watt Climes. Requested to have family bring in meds.            -ammonia checks 13. Bilateral lung contusions with effusions:continue IS to help with SOB/mobilize secretions.  14. Hematuria/bladder: no gross hematuria but moderate hgb in blood.            -continue voiding trial           -condom cath to help keep him dry           -abx as below 15. Leukocytosis:           Keflex 16. Anemia - CBC 17. Constipation - worse: soap suds enema; start Linzess 18. Low Na - monitor BMET     Length of stay,  days: 8  Walker Kehr , MD 05/24/2016, 9:53 AM

## 2016-05-24 NOTE — Progress Notes (Signed)
Occupational Therapy Session Note  Patient Details  Name: [name] MRN: [id number] Date of Birth: Apr 20, 1961  Today's Date: 05/24/2016 OT Individual Time:1100-1200 60 minutes     Short Term Goals: Week 1:  OT Short Term Goal 1 (Week 1): Complete bathing at shower level with mod assist using AE/DME, PRN OT Short Term Goal 2 (Week 1): Complete dressing using AE with mod assist  OT Short Term Goal 3 (Week 1): Don TLSO sitting at EOB with mod assist OT Short Term Goal 4 (Week 1): Perform BADL with min vc to adhere to back precautions  Skilled Therapeutic Interventions/Progress Updates: ADL-retraining with emphasis on transfers, improved independence with donning TLSO, adapted bathing/dressing skills, and AE training.  Pt received in bed and requesting assist to toilet.   With setup and supervision, pt rose from supine to sitting at EOB unassisted, performing log roll independently and rising using bed rail.   Pt donned TLSO with mod assist to maintain precautions.   Pt then ambulated to bathroom using RW with extra time and standby assist for safety.   Pt completed transfer to toilet with supervision and using BSC used as safety frame.  After 5 minutes pt was unproductive for BM and agreed to shower with BSC placed in shower to use as bath bench.  Pt required only setup assist to remove TLSO and he performed shower with min assist only to wash buttocks while remaining on BSC.   Pt dryed himself and donned shirt and TLSO with min assist, pants with min assist using reacher, TEDS and shoes with total assist.   Pt transferred to w/c at end of session and completed grooming at sink unassisted.   Pt left in w/c in prep for self-feeding lunch.     Therapy Documentation Precautions:  Precautions Precautions: Back, Fall Precaution Booklet Issued: No Precaution Comments: Pt unable to recall; requires cues for functional adherence Required Braces or Orthoses: Spinal Brace Spinal Brace:  Thoracolumbosacral orthotic, Applied in sitting position Restrictions Weight Bearing Restrictions: No   Vital Signs: Therapy Vitals Temp: 98.2 F (36.8 C) Temp Source: Oral Pulse Rate: 68 Resp: 18 BP: 113/63 Patient Position (if appropriate): Lying Oxygen Therapy SpO2: 100 % O2 Device: Not Delivered   Pain: 4/10 lower back    ADL: ADL ADL Comments: See Functional Assessment Tool    See Function Navigator for Current Functional Status.   Therapy/Group: Individual Therapy  [name] 05/24/2016, 4:12 PM

## 2016-05-25 ENCOUNTER — Inpatient Hospital Stay (HOSPITAL_COMMUNITY): Payer: Medicaid Other

## 2016-05-25 NOTE — Progress Notes (Signed)
Occupational Therapy Session Note  Patient Details  Name: [name] MRN: [id number] Date of Birth: 11-Oct-1960  Today's Date: 05/25/2016 OT Individual Time: 1450-1535 OT Individual Time Calculation (min): 45 min    Short Term Goals: Week 1:  OT Short Term Goal 1 (Week 1): Complete bathing at shower level with mod assist using AE/DME, PRN OT Short Term Goal 2 (Week 1): Complete dressing using AE with mod assist  OT Short Term Goal 3 (Week 1): Don TLSO sitting at EOB with mod assist OT Short Term Goal 4 (Week 1): Perform BADL with min vc to adhere to back precautions  Skilled Therapeutic Interventions/Progress Updates: ADL-retraining at shower level with focus on functional mobility using RW, adherence to back precautions and AE retraining.   Pt received supine in bed with family present.  Pt requests shower level re-training and is motivated to walk to bathroom using RW.  After setup to provideTLSO pt rises to sit at EOB using bed rails and attempts donning TLSO but requires mod assist to manage straps.  Pt ambulates to bathroom using RW and requires min assist to doff pants and setup to place platform under his feet after transferring to [facility] in shower.   Pt doffs TLSO and showers with only min assist to wash feet and buttocks.  OT re-educated pt on use of reacher to don pants and pt laced pants up to his lower legs successfully but required assist to pull up pants while standing in shower.   OT noted bleed at pt's anus due to hemorrhoids and alerted RN to need for care.   Pt transferred back to bed with mod assist to lift legs as RN arrived to provide care at end of session.      Therapy Documentation Precautions:  Precautions Precautions: Back, Fall Precaution Booklet Issued: No Precaution Comments: Pt unable to recall; requires cues for functional adherence Required Braces or Orthoses: Spinal Brace Spinal Brace: Thoracolumbosacral orthotic, Applied in sitting  position Restrictions Weight Bearing Restrictions: No   Pain: Pain Assessment Pain Assessment: 0-10 Pain Score: 7  Pain Type: Acute pain Pain Location: Back Pain Descriptors / Indicators: Aching Pain Onset: On-going Pain Intervention(s): Medication (See eMAR)  ADL: ADL ADL Comments: See Functional Assessment Tool    See Function Navigator for Current Functional Status.   Therapy/Group: Individual Therapy  [place] 05/25/2016, 3:46 PM

## 2016-05-25 NOTE — Plan of Care (Signed)
Problem: RH PAIN MANAGEMENT Goal: RH STG PAIN MANAGED AT OR BELOW PT'S PAIN GOAL At or below level 5  Outcome: Not Progressing Pain sitting at 8/9 with PRNs meds.

## 2016-05-26 ENCOUNTER — Telehealth: Payer: Self-pay | Admitting: Radiation Oncology

## 2016-05-26 ENCOUNTER — Inpatient Hospital Stay (HOSPITAL_COMMUNITY): Payer: Medicaid Other | Admitting: Occupational Therapy

## 2016-05-26 ENCOUNTER — Encounter: Payer: Self-pay | Admitting: Radiation Oncology

## 2016-05-26 ENCOUNTER — Inpatient Hospital Stay (HOSPITAL_COMMUNITY): Payer: Medicaid Other | Admitting: Physical Therapy

## 2016-05-26 ENCOUNTER — Ambulatory Visit: Payer: Medicaid Other

## 2016-05-26 LAB — BASIC METABOLIC PANEL
ANION GAP: 7 (ref 5–15)
BUN: 16 mg/dL (ref 6–20)
CHLORIDE: 96 mmol/L — AB (ref 101–111)
CO2: 26 mmol/L (ref 22–32)
CREATININE: 0.92 mg/dL (ref 0.61–1.24)
Calcium: 8.6 mg/dL — ABNORMAL LOW (ref 8.9–10.3)
GFR calc non Af Amer: >60 mL/min (ref >60)
Glucose, Bld: 124 mg/dL — ABNORMAL HIGH (ref 65–99)
POTASSIUM: 3.9 mmol/L (ref 3.5–5.1)
SODIUM: 129 mmol/L — AB (ref 135–145)

## 2016-05-26 LAB — CBC
HEMATOCRIT: 27.2 % — AB (ref 39.0–52.0)
HEMOGLOBIN: 8.7 g/dL — AB (ref 13.0–17.0)
MCH: 29.3 pg (ref 26.0–34.0)
MCHC: 32 g/dL (ref 30.0–36.0)
MCV: 91.6 fL (ref 78.0–100.0)
Platelets: 242 10*3/uL (ref 150–400)
RBC: 2.97 MIL/uL — AB (ref 4.22–5.81)
RDW: 15.5 % (ref 11.5–15.5)
WBC: 5.9 10*3/uL (ref 4.0–10.5)

## 2016-05-26 NOTE — Telephone Encounter (Signed)
I spoke with [name] to discuss Dr. Johny Shears recommendation to cancel prostate radiation treatments given his medical issues currently. We would be happy to see him back after completion of all his PT treatments. [name] states agreement and understanding and will relay this to her brother. She feels that with the pain medications he is taking, that he would not be able to have this discussion currently.

## 2016-05-26 NOTE — Plan of Care (Signed)
Problem: RH Bathing Goal: LTG Patient will bathe with assist, cues/equipment (OT) LTG: Patient will bathe specified number of body parts with assist with/without cues using equipment (position)  (OT)  Goal modified as pt not bathing with TLSO donned and per orders pt not allowed to stand without TLSO donned. AL 1/29

## 2016-05-26 NOTE — Progress Notes (Signed)
[facility]IC[facility] & RE[facility]ABILITATION     PROGRESS NOTE    Subjective/Complaints: Had a good weekend. Concerned about "deep bruise" on right buttock    Review of Systems  Constitutional: Negative for fever.  Eyes: Negative for double vision.  Cardiovascular: Negative for chest pain.  Gastrointestinal: Negative for abdominal pain.  Genitourinary: Positive for frequency. Negative for urgency.  Musculoskeletal: Positive for back pain, joint pain and myalgias.  Neurological: Positive for sensory change.        Objective: Vital Signs: Blood pressure 109/66, pulse 72, temperature 98.4 F (36.9 C), temperature source Oral, resp. rate 18, height 5\' 10"  (1.778 m), weight 92.4 kg (203 lb 12.8 oz), SpO2 98 %. No results found.  Recent Labs  05/26/16 0550  WBC 5.9  HGB 8.7*  HCT 27.2*  PLT 242    Recent Labs  05/26/16 0550  NA 129*  K 3.9  CL 96*  GLUCOSE 124*  BUN 16  CREATININE 0.92  CALCIUM 8.6*   CBG (last 3)  No results for input(s): GLUCAP in the last 72 hours.  Wt Readings from Last 3 Encounters:  05/16/16 92.4 kg (203 lb 12.8 oz)  05/10/16 100.6 kg (221 lb 11.2 oz)  05/08/16 96.6 kg (213 lb)    Physical Exam:  Constitutional: He is oriented to person, place, and time. He appears well-developedand well-nourished.    HENT:  Head: Normocephalicand atraumatic.  Eyes: conjunctivae injected/some blood.   Neck: Normal range of motion. Neck supple. No tracheal deviationpresent. No thyromegalypresent.  Cardiovascular: RRR Respiratory:  Normal effort. Clear.  GI: Soft. Bowel sounds are normal. He exhibits no distension. There is no tenderness.  Musculoskeletal: tr  edema bilateral LE's.  Neurological: He is alertand oriented to person, place, and time.  UE motor nearly 5/5 with ongoing pain inhibition due to back pain. LE: 3/5 HF, 3+/5 KE and 3 to 3+/5 bilateral ADF/PF with an element of pain inhibition also. Decreased LT below chest although  still can sense pain.    Skin: scalp wound with area of fibronecrotic tissue exposed decreased, but flap of boggy skin/tissue lateral to open area from which I was able to express necrotic/fibrinous material.      Abrasions on legs/trunk all dry/scabbed/healing gradually. Area on right buttocks improving/healing---may have hematoma deep to this Psych: pleasant and cooperative. A little anxious  Assessment/Plan: 1. Weakness, functional deficits secondary to T12 burst fracture/polytrauma with paraplegia which require 3+ hours per day of interdisciplinary therapy in a comprehensive inpatient rehab setting. Physiatrist is providing close team supervision and 24 hour management of active medical problems listed below. Physiatrist and rehab team continue to assess barriers to discharge/monitor patient progress toward functional and medical goals.  Function:  Bathing Bathing position   Position: Shower  Bathing parts Body parts bathed by patient: Right arm, Left arm, Chest, Abdomen, Front perineal area, Right upper leg, Left upper leg, Back Body parts bathed by helper: Left lower leg, Right lower leg, Buttocks  Bathing assist Assist Level: Touching or steadying assistance(Pt > 75%)      Upper Body Dressing/Undressing Upper body dressing   What is the patient wearing?: Pull over shirt/dress     Pull over shirt/dress - Perfomed by patient: Thread/unthread right sleeve, Thread/unthread left sleeve, Put head through opening, Pull shirt over trunk Pull over shirt/dress - Perfomed by helper: Pull shirt over trunk     Orthosis activity level: Performed by helper  Upper body assist Assist Level: Touching or steadying assistance(Pt > 75%)  Set up : To apply TLSO, cervical collar  Lower Body Dressing/Undressing Lower body dressing   What is the patient wearing?: Pants     Pants- Performed by patient: Thread/unthread right pants leg, Thread/unthread left pants leg Pants- Performed by helper:  Pull pants up/down Non-skid slipper socks- Performed by patient: Don/doff right sock, Don/doff left sock Non-skid slipper socks- Performed by helper: Don/doff right sock, Don/doff left sock   Socks - Performed by helper: Don/doff right sock           TED Hose - Performed by helper: Don/doff right TED hose, Don/doff left TED hose  Lower body assist Assist for lower body dressing: Touching or steadying assistance (Pt > 75%) Assistive Device Comment: sock aid Set up : Don/doff TED stockings  Toileting Toileting   Toileting steps completed by patient: Adjust clothing prior to toileting, Performs perineal hygiene, Adjust clothing after toileting Toileting steps completed by helper: Adjust clothing prior to toileting, Adjust clothing after toileting Toileting Assistive Devices: Grab bar or rail  Toileting assist Assist level:  (total assist)   Transfers Chair/bed transfer   Chair/bed transfer method: Stand pivot Chair/bed transfer assist level: Supervision or verbal cues Chair/bed transfer assistive device: Armrests, Medical sales representative     Max distance: 60 Assist level: Touching or steadying assistance (Pt > 75%)   Wheelchair   Type: Manual Max wheelchair distance: 150 ft Assist Level: Supervision or verbal cues  Cognition Comprehension Comprehension assist level: Follows basic conversation/direction with no assist  Expression Expression assist level: Expresses basic needs/ideas: With no assist  Social Interaction Social Interaction assist level: Interacts appropriately 90% of the time - Needs monitoring or encouragement for participation or interaction.  Problem Solving Problem solving assist level: Solves basic 75 - 89% of the time/requires cueing 10 - 24% of the time  Memory Memory assist level: Recognizes or recalls 75 - 89% of the time/requires cueing 10 - 24% of the time   Medical Problem List and Plan: 1. Functional and mobility deficitssecondary to  multiple spinal fractures including T12 Burst fracture with incomplete paraplegia -continue CIR therapies.   -he's making progress with mobility 2. DVT Prophylaxis/Anticoagulation: Mechanical: Sequential compression devices, below knee Bilateral lower extremities  -continue sq lovenox 40 daily  -hold if any signs of hematuria/bleeding 3. Pain Management: Continue Oxy IR with scheduled robaxin and ultram. oxycontin also seems to be helping as well---increased to 30 q12 with improvement  - Decreased ultram due to liver disease.   -continue Local measures with heat/ice also.   -lyrica for neuropathy  .  4. Mood: LCSW to follow for evaluation and support.  5. Neuropsych: This patient iscapable of making decisions on hisown behalf. 6. Skin/Wound Care: Routine pressure relief measures.   -reassured him that right buttock area is healing  -will ask trauma to look at scalp wound. This may need to be opened up and debrided a bit.  Continue scrubbing of open area and santyl for now.   -continue keflex for now 7. Fluids/Electrolytes/Nutrition: Monitor I/O.   -sodium up to 129 today  -  Lasix held  -encourage PO  8. Prostate cancer: Was undergoing weekly XRT PTA. (THESE ARE NOW ON HOLD) 9. Bladder neck contracture with retention: voiding trial./condom 10. Mild corneal abrasions OD: Poor vision right eye. Showing some improvement 11. ABLA: hgb 8.4 which is a decrease     12. Cirrhosis of liver: Thrombocytopenia resolving. Reports that he was onxifamin and lactulose but was not taking  as prescribed--managed by Dr. Watt Climes. Requested to have family bring in meds.   -ammonia level 34 recently 44. Bilateral lung contusions with effusions:continue IS to help with SOB/mobilize secretions.  14. Hematuria/bladder: no gross hematuria but moderate hgb in blood.   -continue voiding trial  -condom cath to help keep him dry  -abx as below 15. Leukocytosis:  -wbc's down to 7.3   -ua  suspicious. ucx with multispecies  -    LOS (Days) 10 A FACE TO FACE EVALUATION WAS PERFORMED  Meredith Staggers, MD 05/26/2016 9:04 AM

## 2016-05-26 NOTE — Progress Notes (Signed)
Physical Therapy Session Note  Patient Details  Name: [name] MRN: [id number] Date of Birth: 03/07/1961  Today's Date: 05/26/2016 PT Individual Time: 1400-1515 PT Individual Time Calculation (min): 75 min   Short Term Goals: Week 2:  PT Short Term Goal 1 (Week 2): Pt will perform bed mobility modI PT Short Term Goal 2 (Week 2): Pt will perform stand pivot transfers with consistent S PT Short Term Goal 3 (Week 2): Pt will ambulate x50' with min guard PT Short Term Goal 4 (Week 2): Pt will ascend/descend 1+1 curb step with RW and minA  Skilled Therapeutic Interventions/Progress Updates: Pt received seated in w/c, c/o pain as below and agreeable to treatment. W/c propulsion x150' with S for UE strengthening and endurance. Gait x94' and x100' with RW and min guard/close S; extended rest breaks following each trial d/t fatigue. Attempted sit <>stand at stairs and stepping LLE onto first 3" step to perform lateral step ups; pt unable to tolerate position d/t pain reported in ribs. Side stepping R/L in parallel bars for hip abduction strengthening, min guard x4 trials with rest breaks between d/t fatigue. Stand pivot transfer w/c <>nustep with RW and S. Performed nustep x10 min with BUE/BLE for strengthening and endurance. Remained seated in w/c at end of session, all needs in reach. RN alerted to pt request to sit in recliner.     Therapy Documentation Precautions:  Precautions Precautions: Back, Fall Precaution Booklet Issued: No Precaution Comments: Pt unable to recall; requires cues for functional adherence Required Braces or Orthoses: Spinal Brace Spinal Brace: Thoracolumbosacral orthotic, Applied in sitting position Restrictions Weight Bearing Restrictions: No Pain: Pain Assessment Pain Assessment: 0-10 Pain Score: 7  Pain Location: Back Pain Orientation: Mid;Lower Pain Descriptors / Indicators: Aching Patients Stated Pain Goal: 7 Pain Intervention(s): Medication (See  eMAR);Repositioned   See Function Navigator for Current Functional Status.   Therapy/Group: Individual Therapy  Luberta Mutter 05/26/2016, 3:09 PM

## 2016-05-26 NOTE — Progress Notes (Signed)
Progress Note:  Subjective: general surgery asked to re-evaluate occipital scalp wound due to partial wound dehiscence. Patient denies headache, increased pain over wound, bleeding or purulent drainage from wound.   Objective: Gen: alert, oriented, no acute distress, cooeprative HEENT: laceration c/d/i with staples in place - mild (~3 cm) dehiscence at mid-proximal aspect of wound. No surrounding erythema, fluctuance, or active drainage.   A/P: - no acute surgical needs.  - d/c staples - daily antibiotic ointment/Bactroban - no need to cover wound  Obie Dredge, [facility] Surgery Pager: [phone] Consults: [phone] Mon-Fri 7:00 am-4:30 pm Sat-Sun 7:00 am-11:30 am

## 2016-05-26 NOTE — Progress Notes (Signed)
Occupational Therapy Weekly Progress Note  Patient Details  Name: [name] MRN: [id number] Date of Birth: 04-Apr-1961  Beginning of progress report period: May 17, 2016 End of progress report period: May 26, 2016  Today's Date: 05/26/2016 OT Individual Time: 0762-2633 OT Individual Time Calculation (min): 60 min    Patient has met 4 of 4 short term goals.  Pt making steady progress towards OT goals. He cont to be most limited by pain as well as decreased activity tolerance. Cont education regarding AE for ADLs for adherence to spinal pre-cautions. He requires assist for toileting tasks as well as LB dressing due to decreased standing tolerance as well as ability to reach backside while maintaining back pre-cautions and with TLSO donned.   Patient continues to demonstrate the following deficits: acute pain, cognitive deficits, muscle weakness (generalized) and pain in thoracic spine and therefore will continue to benefit from skilled OT intervention to enhance overall performance with BADL.  Patient progressing toward long term goals..  Plan of care revisions: Bathing goal modified as not to require sit <> stand per MD order. See POC for goal revisions.  OT Short Term Goals Week 1:  OT Short Term Goal 1 (Week 1): Complete bathing at shower level with mod assist using AE/DME, PRN OT Short Term Goal 1 - Progress (Week 1): Met OT Short Term Goal 2 (Week 1): Complete dressing using AE with mod assist  OT Short Term Goal 2 - Progress (Week 1): Met OT Short Term Goal 3 (Week 1): Don TLSO sitting at EOB with mod assist OT Short Term Goal 3 - Progress (Week 1): Met OT Short Term Goal 4 (Week 1): Perform BADL with min vc to adhere to back precautions OT Short Term Goal 4 - Progress (Week 1): Met Week 2:  OT Short Term Goal 1 (Week 2): STG=LTG due to LOS  Skilled Therapeutic Interventions/Progress Updates:    Pt seen for OT session focusing on ADL re-training. Pt in supine upon  arrival, agreeable to tx session. He complained of 8/10 pain all over, RN aware and medication administered. He transferred to EOB with increased time using hospital bed functions. He declined bathing this morning, opting just to dress. He dressed seated EOB, required mod A to don TLSO. Pt reports he will have assist to don TLSO at d/c. He dressed LB seated EOB, educated regarding technique for threading catheter through pants. Assist required to pull pants over  Brief and extended seated rest break required following standing trial.  Pt's RN performed skin check during session, shearing noted to R buttock. Educated pt regarding of using UEs to lift self up when readjusting in chair vs. Sliding forward/ back and shearing. Pt ambulated with min A and VCs for RW management into bathroom for attempted void, however, unable to pass BM. He was able to stand from standard height toilet with close supervision using grab bar, assist required for clothing management. Pt ambulated back to w/c. Pt left seated in w/c at sink completing grooming tasks, all needs in reach.    Therapy Documentation Precautions:  Precautions Precautions: Back, Fall Precaution Booklet Issued: No Precaution Comments: Pt unable to recall; requires cues for functional adherence Required Braces or Orthoses: Spinal Brace Spinal Brace: Thoracolumbosacral orthotic, Applied in sitting position Restrictions Weight Bearing Restrictions: No Pain: Pain Assessment Pain Assessment: 0-10 Pain Score: 8 Pain Location: Back Pain Descriptors / Indicators: Aching Pain Frequency: Constant Pain Intervention(s): Medication (See eMAR), RN aware, repositioned ADL: ADL ADL Comments:  See Functional Assessment Tool  See Function Navigator for Current Functional Status.   Therapy/Group: Individual Therapy  [name] C 05/26/2016, 7:11 AM

## 2016-05-26 NOTE — Progress Notes (Signed)
Occupational Therapy Session Note  Patient Details  Name: [name] MRN: [id number] Date of Birth: [birth year]  Today's Date: 05/26/2016 OT Individual Time: 1300-1346 OT Individual Time Calculation (min): 46 min    Short Term Goals: Week 2:  OT Short Term Goal 1 (Week 2): STG=LTG due to LOS  Skilled Therapeutic Interventions/Progress Updates:    Pt completed sit to stand for managing clothing and for cleaning up leaking condom cath with min assist during session.  Utilized reacher for doffing wet pants with min assist as well as for doffing shoes with supervision.  Therapist assisted with re-donning brief and pants over feet.  He was able to donn the right shoe with min assist and the left shoe with mod assist.  Max assist for pulling pants over hips in standing.  Pt unable to let go of the walker in standing to complete this.  Pt left with nursing at end of session in wheelchair.   Therapy Documentation Precautions:  Precautions Precautions: Back, Fall Precaution Booklet Issued: No Precaution Comments: Pt unable to recall; requires cues for functional adherence Required Braces or Orthoses: Spinal Brace Spinal Brace: Thoracolumbosacral orthotic, Applied in sitting position Restrictions Weight Bearing Restrictions: No  Pain: Pain Assessment Pain Assessment: Faces Pain Score: 7  Faces Pain Scale: Hurts a little bit Pain Type: Acute pain Pain Location: Back Pain Orientation: Lower Pain Onset: With Activity Pain Intervention(s): Repositioned ADL: See Function Navigator for Current Functional Status.   Therapy/Group: Individual Therapy  Laquan Beier OTR/L 05/26/2016, 3:55 PM

## 2016-05-26 NOTE — Progress Notes (Signed)
Occupational Therapy Session Note  Patient Details  Name: [name] MRN: [id number] Date of Birth: 1960-09-09  Today's Date: 05/26/2016 OT Individual Time: 4098-1191 OT Individual Time Calculation (min): 58 min   Short Term Goals: Week 1:  OT Short Term Goal 1 (Week 1): Complete bathing at shower level with mod assist using AE/DME, PRN OT Short Term Goal 1 - Progress (Week 1): Met OT Short Term Goal 2 (Week 1): Complete dressing using AE with mod assist  OT Short Term Goal 2 - Progress (Week 1): Met OT Short Term Goal 3 (Week 1): Don TLSO sitting at EOB with mod assist OT Short Term Goal 3 - Progress (Week 1): Met OT Short Term Goal 4 (Week 1): Perform BADL with min vc to adhere to back precautions OT Short Term Goal 4 - Progress (Week 1): Met Week 2:  OT Short Term Goal 1 (Week 2): STG=LTG due to LOS  Skilled Therapeutic Interventions/Progress Updates:  Pain: See below  Upon entering room, pt found seated in w/c. Pt with increased lethargy, suspect due to medications. Therapist propelled w/c to BR door and pt stood from w/c with min assist and ambulated into BR using RW with min assist. Pt required mod assist for transfers on/off handicap height toilet seat. Pt stood and attempted to complete peri cleansing while adhering to back precautions. Therapist taught pt to bend at his knees, attempt slight lunge, but due to patient's pain and weakness he was unable to successful complete this today. Talked with pt about toileting aid. At this time, recommend pt sit for peri cleansing due to increased weakness and pain. Pt stood to pull pants up to waist, but required total assist from therapist to complete this. Anticipate pt with difficulty due to sitting on toilet for awhile attempting to have a BM. Pt transferred toilet to w/c, stand pivot with therapist in front of him (mod assist). Pt with +blood found on toilet seat, notified patient's RN of this. Pt sat at sink in w/c to complete washing  of face and hair. Educated pt on w/c management of locking brakes and managing leg rests. Pt then maneuvered w/c beside bed. At end of session, left pt seated in w/c with all needs within reach.   Therapy Documentation Precautions:  Precautions Precautions: Back, Fall Precaution Booklet Issued: No Precaution Comments: Pt unable to recall; requires cues for functional adherence Required Braces or Orthoses: Spinal Brace Spinal Brace: Thoracolumbosacral orthotic, Applied in sitting position Restrictions Weight Bearing Restrictions: No  Pain: Pain Assessment Pain Assessment: 0-10 Pain Score: 8  Pain Location: Back Pain Orientation: Mid;Lower Pain Descriptors / Indicators: Aching Pain Frequency: Constant Pain Intervention(s): Patient pre-medicated  ADL: ADL ADL Comments: See Functional Assessment Tool  See Function Navigator for Current Functional Status.  Therapy/Group: Individual Therapy  Chrys Racer , MS, OTR/L, CLT  05/26/2016, 10:47 AM

## 2016-05-27 ENCOUNTER — Inpatient Hospital Stay (HOSPITAL_COMMUNITY): Payer: Medicaid Other | Admitting: Occupational Therapy

## 2016-05-27 ENCOUNTER — Inpatient Hospital Stay (HOSPITAL_COMMUNITY): Payer: Medicaid Other | Admitting: Physical Therapy

## 2016-05-27 ENCOUNTER — Ambulatory Visit: Payer: Medicaid Other

## 2016-05-27 NOTE — Progress Notes (Signed)
Occupational Therapy Session Note  Patient Details  Name: [name] MRN: [id number] Date of Birth: March 26, 1961  Today's Date: 05/27/2016 OT Individual Time: 1000-1100 OT Individual Time Calculation (min): 60 min    Short Term Goals: Week 2:  OT Short Term Goal 1 (Week 2): STG=LTG due to LOS  Skilled Therapeutic Interventions/Progress Updates:    Pt seen for OT ADL bathing/dressing session. Pt sitting up in w/c upon arrival, ready for shower, but first desiring to attempt BM void. He ambulated into bathroom with CGA using RW. With steadying assist, pt able to manage clothing, + for void and voiced feeling much better following BM. He showered seated on 3-1 BSC, able to reach in hole of BSC to complete buttock hygiene. Shirt and TLSO donned in shower and then pt ambulated out to w/c to complete LB dressing. Pt able to thread catheter and LEs into pants. Min A required when standing at RW to pull pants entirely over hips.  Pt left seated in w/c at end of session, completing grooming tasks with hand off to PT. Educated throughout session regarding activity progression and d/c planning.   Therapy Documentation Precautions:  Precautions Precautions: Back, Fall Precaution Booklet Issued: No Precaution Comments: Pt unable to recall; requires cues for functional adherence Required Braces or Orthoses: Spinal Brace Spinal Brace: Thoracolumbosacral orthotic, Applied in sitting position Restrictions Weight Bearing Restrictions: No Pain:   No/ denies pain ADL: ADL ADL Comments: See Functional Assessment Tool  See Function Navigator for Current Functional Status.   Therapy/Group: Individual Therapy  [name], Kamerin Axford C 05/27/2016, 6:53 AM

## 2016-05-27 NOTE — Progress Notes (Addendum)
Physical Therapy Session Note  Patient Details  Name: [name] MRN: [id number] Date of Birth: [birth year]  Today's Date: 05/27/2016 PT Individual Time: [id number] PT Individual Time Calculation (min): 55 min   Short Term Goals: Week 2:  PT Short Term Goal 1 (Week 2): Pt will perform bed mobility modI PT Short Term Goal 2 (Week 2): Pt will perform stand pivot transfers with consistent S PT Short Term Goal 3 (Week 2): Pt will ambulate x50' with min guard PT Short Term Goal 4 (Week 2): Pt will ascend/descend 1+1 curb step with RW and minA  Skilled Therapeutic Interventions/Progress Updates:    Pt received in handoff from OT. Pt agreeable to tx, noting 8/10 back pain & RN made aware but reports pt is not due for pain medicine yet. Pt propelled w/c room<>gym with BUE for cardiovascular endurance training. Pt ambulated 100 ft + 30 ft with steady assist/supervision, demonstrating heavy reliance on BUE on RW, decreased foot clearance and decreased heel strike BLE. Pt negotiated single step (6") x 1 repetition + 2 repetitions + 1 repetition with B rails and mod assist with heavy reliance on BUE and poor concentric and eccentric control in BLE. Pt requires cuing to negotiate steps with compensatory technique. Pt requires frequent rest breaks throughout session 2/2 fatigue. At end of session pt left in w/c in room with RN present.  Pt required assistance to recall 2/3 back precautions.   Therapy Documentation Precautions:  Precautions Precautions: Back, Fall Precaution Booklet Issued: No Precaution Comments: Pt unable to recall; requires cues for functional adherence Required Braces or Orthoses: Spinal Brace Spinal Brace: Thoracolumbosacral orthotic, Applied in sitting position Restrictions Weight Bearing Restrictions: No   See Function Navigator for Current Functional Status.   Therapy/Group: Individual Therapy  [name] 05/27/2016, 12:13 PM

## 2016-05-27 NOTE — Progress Notes (Signed)
Physical Therapy Session Note  Patient Details  Name: [name] MRN: [id number] Date of Birth: 09/01/1960  Today's Date: 05/27/2016 PT Individual Time: 0800-0900 and 1300-1400 PT Individual Time Calculation (min): 60 min and 60 min (total 120 min)   Short Term Goals: Week 2:  PT Short Term Goal 1 (Week 2): Pt will perform bed mobility modI PT Short Term Goal 2 (Week 2): Pt will perform stand pivot transfers with consistent S PT Short Term Goal 3 (Week 2): Pt will ambulate x50' with min guard PT Short Term Goal 4 (Week 2): Pt will ascend/descend 1+1 curb step with RW and minA  Skilled Therapeutic Interventions/Progress Updates: Tx 1: Pt received supine in bed with RN present administering medication, pt reports muscle soreness. Condom catheter emptied 600 mL. Supine>sit without bedrails and bed flat to simulate home environment, S with increased time. Seated on EOB, TEDs donned totalA, shoes and orthosis donned with S, increased time and reacher. Required assist to tie shoes; elastic shoe laces donned to shoes and educated pt in purpose and technique however pt declined attempting to don shoes again d/t fatigue. Standing at sink pt brushed teeth; requested pt to sit after first minute d/t heavy reliance on elbows on sink for balance. Gait x150' with RW on level surface and S, x100' on carpeted surface. Improving speed and weight bearing through LEs noted throughout. Remained seated in w/c at end of session, all needs in reach.  Tx 2: Pt received seated in w/c, family present; c/o pain 4/10 in low back and agreeable to treatment. W/c propulsion x150' with BUE for strengthening and endurance. Ascent/descent one 4" step with RW and minA to simulate home environment. Pt and family brought into ADL apartment and discussed shower chair options; will discuss with OT for recommendations. Ambulatory transfer w/c <>flat bed with RW and S; sister provided minA to lift LEs into bed. Supine>sit with S.  Stand pivot transfer w/c <>nustep with RW and S. RW adjusted for increased height to A with upright posture. Performed nustep x12 min with BUE/BLE level 7 for strengthening and endurance. Remained seated in w/c at end of session, all needs in reach.      Therapy Documentation Precautions:  Precautions Precautions: Back, Fall Precaution Booklet Issued: No Precaution Comments: Pt unable to recall; requires cues for functional adherence Required Braces or Orthoses: Spinal Brace Spinal Brace: Thoracolumbosacral orthotic, Applied in sitting position Restrictions Weight Bearing Restrictions: No   See Function Navigator for Current Functional Status.   Therapy/Group: Individual Therapy  Luberta Mutter 05/27/2016, 9:15 AM

## 2016-05-27 NOTE — Progress Notes (Signed)
[place] PHYSICAL MEDICINE & REHABILITATION     PROGRESS NOTE    Subjective/Complaints: No problems overnight. Continues to progress. Had an accident in therapy after condom cath came loose.     Review of Systems  Constitutional: Negative for fever.  Eyes: Negative for double vision.  Cardiovascular: Negative for chest pain.  Gastrointestinal: Negative for abdominal pain.  Genitourinary: Positive for frequency. Negative for urgency.  Musculoskeletal: Positive for back pain and myalgias.  Neurological: Positive for sensory change.        Objective: Vital Signs: Blood pressure 115/61, pulse 68, temperature 98.5 F (36.9 C), temperature source Oral, resp. rate 18, height 5\' 10"  (1.778 m), weight 92.4 kg (203 lb 12.8 oz), SpO2 98 %. No results found.  Recent Labs  05/26/16 0550  WBC 5.9  HGB 8.7*  HCT 27.2*  PLT 242    Recent Labs  05/26/16 0550  NA 129*  K 3.9  CL 96*  GLUCOSE 124*  BUN 16  CREATININE 0.92  CALCIUM 8.6*   CBG (last 3)  No results for input(s): GLUCAP in the last 72 hours.  Wt Readings from Last 3 Encounters:  05/16/16 92.4 kg (203 lb 12.8 oz)  05/10/16 100.6 kg (221 lb 11.2 oz)  05/08/16 96.6 kg (213 lb)    Physical Exam:  Constitutional: [name] is oriented to person, place, and time. [name] appears well-developedand well-nourished.    HENT:  Head: Normocephalicand atraumatic.  Eyes: conjunctivae injected/some blood.   Neck: Normal range of motion. Neck supple. No tracheal deviationpresent. No thyromegalypresent.  Cardiovascular: RRR Respiratory:  Normal effort. Clear.  GI: Soft. Bowel sounds are normal. [name] exhibits no distension. There is no tenderness.  Musculoskeletal: tr  edema bilateral LE's.  Neurological: [name] is alertand oriented to person, place, and time.  UE motor nearly 5/5 with ongoing pain inhibition due to back pain. LE: 3/5 HF, 3+/5 KE and 3 to 3+/5 bilateral ADF/PF with an element of pain inhibition also. Decreased LT  below chest although still can sense pain.    Skin: staples out. fibronecrotic debris still. Drainage on pillow      Abrasions on legs/trunk all dry/scabbed/healing gradually. Area on right buttocks improving/healing---may have hematoma deep to this Psych: pleasant and cooperative. A little anxious  Assessment/Plan: 1. Weakness, functional deficits secondary to T12 burst fracture/polytrauma with paraplegia which require 3+ hours per day of interdisciplinary therapy in a comprehensive inpatient rehab setting. Physiatrist is providing close team supervision and 24 hour management of active medical problems listed below. Physiatrist and rehab team continue to assess barriers to discharge/monitor patient progress toward functional and medical goals.  Function:  Bathing Bathing position   Position: Shower  Bathing parts Body parts bathed by patient: Right arm, Left arm, Chest, Abdomen, Front perineal area, Right upper leg, Left upper leg, Back Body parts bathed by helper: Left lower leg, Right lower leg, Buttocks  Bathing assist Assist Level: Touching or steadying assistance(Pt > 75%)      Upper Body Dressing/Undressing Upper body dressing   What is the patient wearing?: Pull over shirt/dress, Orthosis     Pull over shirt/dress - Perfomed by patient: Thread/unthread right sleeve, Thread/unthread left sleeve, Put head through opening, Pull shirt over trunk Pull over shirt/dress - Perfomed by helper: Pull shirt over trunk     Orthosis activity level: Performed by patient  Upper body assist Assist Level: Touching or steadying assistance(Pt > 75%)   Set up : To apply TLSO, cervical collar  Lower Body Dressing/Undressing  Lower body dressing   What is the patient wearing?: Shoes     Pants- Performed by patient: Thread/unthread right pants leg, Thread/unthread left pants leg Pants- Performed by helper: Pull pants up/down, Thread/unthread right pants leg, Thread/unthread left pants  leg Non-skid slipper socks- Performed by patient: Don/doff right sock, Don/doff left sock Non-skid slipper socks- Performed by helper: Don/doff right sock, Don/doff left sock   Socks - Performed by helper: Don/doff right sock Shoes - Performed by patient: Don/doff right shoe, Don/doff left shoe Shoes - Performed by helper: Fasten right, Fasten left       TED Hose - Performed by helper: Don/doff right TED hose, Don/doff left TED hose  Lower body assist Assist for lower body dressing: Touching or steadying assistance (Pt > 75%) Assistive Device Comment: sock aid Set up : Don/doff TED stockings, To obtain clothing/put away  Toileting Toileting   Toileting steps completed by patient: Adjust clothing prior to toileting, Adjust clothing after toileting Toileting steps completed by helper: Adjust clothing prior to toileting, Performs perineal hygiene, Adjust clothing after toileting Toileting Assistive Devices: Grab bar or rail  Toileting assist Assist level:  (total assist)   Transfers Chair/bed transfer   Chair/bed transfer method: Stand pivot Chair/bed transfer assist level: Supervision or verbal cues Chair/bed transfer assistive device: Armrests, Medical sales representative     Max distance: 100 Assist level: Touching or steadying assistance (Pt > 75%)   Wheelchair   Type: Manual Max wheelchair distance: 150 ft Assist Level: Supervision or verbal cues  Cognition Comprehension Comprehension assist level: (P) Follows basic conversation/direction with no assist  Expression Expression assist level: (P) Expresses basic needs/ideas: With no assist  Social Interaction Social Interaction assist level: (P) Interacts appropriately 90% of the time - Needs monitoring or encouragement for participation or interaction.  Problem Solving Problem solving assist level: (P) Solves basic 75 - 89% of the time/requires cueing 10 - 24% of the time  Memory Memory assist level: (P) Recognizes or  recalls 75 - 89% of the time/requires cueing 10 - 24% of the time   Medical Problem List and Plan: 1. Functional and mobility deficitssecondary to multiple spinal fractures including T12 Burst fracture with incomplete paraplegia -continue CIR therapies.   -team conference today 2. DVT Prophylaxis/Anticoagulation: Mechanical: Sequential compression devices, below knee Bilateral lower extremities  -continue sq lovenox 40 daily  -hold if any signs of hematuria/bleeding 3. Pain Management: Continue Oxy IR with scheduled robaxin and ultram. oxycontin also seems to be helping as well---increased to 30 q12 with improvement  - Decreased ultram due to liver disease.   -continue Local measures with heat/ice also.   -lyrica for neuropathy  .  4. Mood: LCSW to follow for evaluation and support.  5. Neuropsych: This patient iscapable of making decisions on hisown behalf. 6. Skin/Wound Care: Routine pressure relief measures.   -reassured him that right buttock area is healing  -appreciate surgery follow up. Staples out  -antibiotic ointment, will dc santyl    -dc keflex 7. Fluids/Electrolytes/Nutrition: Monitor I/O.   -sodium up to 129 yesterday  -  Lasix held  -encourage PO  8. Prostate cancer: Was undergoing weekly XRT PTA. (THESE ARE NOW ON HOLD) 9. Bladder neck contracture with retention: voiding trial./condom 10. Mild corneal abrasions OD: Poor vision right eye. Showing some improvement 11. ABLA: hgb 8.4 which is a decrease     12. Cirrhosis of liver: Thrombocytopenia resolving. Reports that [name] was onxifamin and lactulose but was not  taking as prescribed--managed by Dr. Watt Climes. Requested to have family bring in meds.   -ammonia level 34 recently 53. Bilateral lung contusions with effusions:continue IS to help with SOB/mobilize secretions.  14. Hematuria/bladder: no gross hematuria but moderate hgb in blood.   -continue voiding trial  -condom cath to continue even  once home  -abx as below 15. Leukocytosis:  -wbc's down to 7.3   -ua suspicious. ucx with only multispecies  -    LOS (Days) 11 A FACE TO FACE EVALUATION WAS PERFORMED  Justin Simons T, MD 05/27/2016 8:50 AM

## 2016-05-28 ENCOUNTER — Inpatient Hospital Stay (HOSPITAL_COMMUNITY): Payer: Medicaid Other | Admitting: Physical Therapy

## 2016-05-28 ENCOUNTER — Inpatient Hospital Stay (HOSPITAL_COMMUNITY): Payer: Medicaid Other | Admitting: Occupational Therapy

## 2016-05-28 ENCOUNTER — Ambulatory Visit: Payer: Medicaid Other

## 2016-05-28 NOTE — Progress Notes (Signed)
Social Work Patient ID: [name], male   DOB: 1961-04-02, 56 y.o.   MRN: [id number]   CSW met with pt and spoke with pt's sister, Lenna Sciara, via telephone to update them on team conference discussion.  Pt is on target for 06-04-16 d/c date and pt's sisters plan to come for family education on 05-30-16.  Pt is pleased with d/c next week and feels ready to go home.  Pt's sister [name]) and brother-in-law to provide 24/7 supervision.  Pt's sister Lenna Sciara) will transport pt to appointments.  CSW will arrange [facility] and order DME closer to d/c.  Pt may need bath aide at home for bathing.  CSW will continue to follow and assist as needed.

## 2016-05-28 NOTE — Progress Notes (Signed)
[place] PHYSICAL MEDICINE & REHABILITATION     PROGRESS NOTE    Subjective/Complaints: Up with therapy. No new problems yesterday or overnight.    Review of Systems  Constitutional: Negative for fever.  Eyes: Negative for double vision.  Cardiovascular: Negative for chest pain.  Gastrointestinal: Negative for abdominal pain.  Genitourinary: Positive for frequency.  Musculoskeletal: Positive for back pain and joint pain.  Neurological: Positive for sensory change.        Objective: Vital Signs: Blood pressure 96/62, pulse 66, temperature 97.7 F (36.5 C), temperature source Oral, resp. rate 18, height 5\' 10"  (1.778 m), weight 92.3 kg (203 lb 8 oz), SpO2 99 %. No results found.  Recent Labs  05/26/16 0550  WBC 5.9  HGB 8.7*  HCT 27.2*  PLT 242    Recent Labs  05/26/16 0550  NA 129*  K 3.9  CL 96*  GLUCOSE 124*  BUN 16  CREATININE 0.92  CALCIUM 8.6*   CBG (last 3)  No results for input(s): GLUCAP in the last 72 hours.  Wt Readings from Last 3 Encounters:  05/28/16 92.3 kg (203 lb 8 oz)  05/10/16 100.6 kg (221 lb 11.2 oz)  05/08/16 96.6 kg (213 lb)    Physical Exam:  Constitutional: He is oriented to person, place, and time. He appears well-developedand well-nourished.    HENT:  Head: Normocephalicand atraumatic.  Eyes: conjunctivae injected/some blood.   Neck: Normal range of motion. Neck supple. No tracheal deviationpresent. No thyromegalypresent.  Cardiovascular: RRR Respiratory:  Normal effort. Clear.  GI: Soft. Bowel sounds are normal. He exhibits no distension. There is no tenderness.  Musculoskeletal: tr  edema bilateral LE's.  Neurological: He is alertand oriented to person, place, and time.  UE motor nearly 5/5 with ongoing pain inhibition due to back pain. LE: 3/5 HF, 3+/5 KE and 3 to 3+/5 bilateral ADF/PF with an element of pain inhibition also. Decreased LT below chest although still can sense pain.    Skin: staples out.  fibronecrotic debris still. Still with drainage on pillow   Abrasions on buttock,legs/arms all healing.  Psych: pleasant and cooperative. A little anxious  Assessment/Plan: 1. Weakness, functional deficits secondary to T12 burst fracture/polytrauma with paraplegia which require 3+ hours per day of interdisciplinary therapy in a comprehensive inpatient rehab setting. Physiatrist is providing close team supervision and 24 hour management of active medical problems listed below. Physiatrist and rehab team continue to assess barriers to discharge/monitor patient progress toward functional and medical goals.  Function:  Bathing Bathing position   Position: Shower  Bathing parts Body parts bathed by patient: Right arm, Left arm, Chest, Abdomen, Front perineal area, Right upper leg, Left upper leg, Back, Buttocks, Right lower leg, Left lower leg Body parts bathed by helper: Left lower leg, Right lower leg, Buttocks  Bathing assist Assist Level: Set up   Set up : To obtain items  Upper Body Dressing/Undressing Upper body dressing   What is the patient wearing?: Pull over shirt/dress, Orthosis     Pull over shirt/dress - Perfomed by patient: Thread/unthread right sleeve, Thread/unthread left sleeve, Put head through opening, Pull shirt over trunk Pull over shirt/dress - Perfomed by helper: Pull shirt over trunk     Orthosis activity level: Performed by helper  Upper body assist Assist Level: Set up   Set up : To apply TLSO, cervical collar  Lower Body Dressing/Undressing Lower body dressing   What is the patient wearing?: Shoes, Pants, [employer]  Pants- Performed by patient: Thread/unthread right pants leg, Thread/unthread left pants leg, Pull pants up/down Pants- Performed by helper: Pull pants up/down, Thread/unthread right pants leg, Thread/unthread left pants leg Non-skid slipper socks- Performed by patient: Don/doff right sock, Don/doff left sock Non-skid slipper socks-  Performed by helper: Don/doff right sock, Don/doff left sock   Socks - Performed by helper: Don/doff right sock Shoes - Performed by patient: Don/doff right shoe, Don/doff left shoe Shoes - Performed by helper: Fasten right, Fasten left       TED Hose - Performed by helper: Don/doff right TED hose, Don/doff left TED hose  Lower body assist Assist for lower body dressing: Touching or steadying assistance (Pt > 75%) Assistive Device Comment: sock aid Set up : Don/doff TED stockings, To obtain clothing/put away  Toileting Toileting   Toileting steps completed by patient: Adjust clothing prior to toileting, Adjust clothing after toileting Toileting steps completed by helper: Performs perineal hygiene, Adjust clothing after toileting Toileting Assistive Devices: Grab bar or rail  Toileting assist Assist level:  (total assist)   Transfers Chair/bed transfer   Chair/bed transfer method: Stand pivot Chair/bed transfer assist level: Supervision or verbal cues Chair/bed transfer assistive device: Armrests, Medical sales representative     Max distance: 100 ft Assist level: Touching or steadying assistance (Pt > 75%)   Wheelchair   Type: Manual Max wheelchair distance: 150 ft Assist Level: Supervision or verbal cues  Cognition Comprehension Comprehension assist level: Follows basic conversation/direction with no assist  Expression Expression assist level: Expresses basic needs/ideas: With no assist  Social Interaction Social Interaction assist level: Interacts appropriately 90% of the time - Needs monitoring or encouragement for participation or interaction.  Problem Solving Problem solving assist level: Solves basic 90% of the time/requires cueing < 10% of the time  Memory Memory assist level: Recognizes or recalls 90% of the time/requires cueing < 10% of the time   Medical Problem List and Plan: 1. Functional and mobility deficitssecondary to multiple spinal fractures  including T12 Burst fracture with incomplete paraplegia -continue CIR therapies.   -pt showing continued functional progress 2. DVT Prophylaxis/Anticoagulation: Mechanical: Sequential compression devices, below knee Bilateral lower extremities  -continue sq lovenox 40 daily  -hold if any signs of hematuria/bleeding 3. Pain Management: Continue Oxy IR with scheduled robaxin and ultram. oxycontin also seems to be helping as well---increased to 30 q12 with improvement  - Decreased ultram due to liver disease.   -continue Local measures with heat/ice also.   -lyrica for neuropathy  .  4. Mood: LCSW to follow for evaluation and support.  5. Neuropsych: This patient iscapable of making decisions on hisown behalf. 6. Skin/Wound Care: Routine pressure relief measures.   -reassured him that right buttock area is healing  -appreciate surgery follow up. Staples out  -antibiotic ointment     -off keflex 7. Fluids/Electrolytes/Nutrition: Monitor I/O.   -sodium up to 129 yesterday  -  Lasix held  -encourage PO  8. Prostate cancer: Was undergoing weekly XRT PTA. (THESE ARE NOW ON HOLD) 9. Bladder neck contracture with retention: voiding trial./condom 10. Mild corneal abrasions OD: Poor vision right eye. Showing some improvement 11. ABLA: hgb 8.4 which is a decrease     12. Cirrhosis of liver: Thrombocytopenia resolving. Reports that he was onxifamin and lactulose but was not taking as prescribed--managed by Dr. Watt Climes. Requested to have family bring in meds.   -ammonia level 34 recently 60. Bilateral lung contusions with effusions:continue IS to  help with SOB/mobilize secretions.  14. Hematuria/bladder: no gross hematuria but moderate hgb in blood.   -continue voiding trial  -condom cath to continue even once home  -abx as below 15. Leukocytosis:  -wbc's down to 7.3   - ucx with only multispecies  -    LOS (Days) 12 A FACE TO FACE EVALUATION WAS  PERFORMED  Alger Simons T, MD 05/28/2016 8:40 AM

## 2016-05-28 NOTE — Progress Notes (Signed)
Occupational Therapy Session Note  Patient Details  Name: [name] MRN: [id number] Date of Birth: 25-Oct-1960  Today's Date: 05/28/2016 OT Individual Time: 0735-0900 OT Individual Time Calculation (min): 85 min    Short Term Goals: Week 2:  OT Short Term Goal 1 (Week 2): STG=LTG due to LOS  Skilled Therapeutic Interventions/Progress Updates:    Pt seen for OT ADL bathing/dressing session. Pt in supine upon arrival, agreeable to tx session.  Bathing completed in ADL apartment utilzing tub/shower combination and tub bench in simulation of home environment. He transferred to EOB with supervision, donned TLSO with min A to tighten.   He gathered clothing items from w/c level using reacher. He self propelled w/c to ADL apartment, ambulated into bathroom wiith close supervision and supervision with VCs for technique for tub/shower transfer utilizing tub transfer bench. HE bathed with set-up assist on tub bench. He dressed seated in w/c, utilizing reacher for LB dressing and VCs for managing catheter through pants. He returned to room at end of session, left sitting at sink completing grooming tasks mod I. Pt provided with LH shoe horn and encouraged to work on donning shoes btwn tx sessions.  All needs in reach.   Therapy Documentation Precautions:  Precautions Precautions: Back, Fall Precaution Booklet Issued: No Precaution Comments: Pt unable to recall; requires cues for functional adherence Required Braces or Orthoses: Spinal Brace Spinal Brace: Thoracolumbosacral orthotic, Applied in sitting position Restrictions Weight Bearing Restrictions: No ADL: ADL ADL Comments: See Functional Assessment Tool  See Function Navigator for Current Functional Status.   Therapy/Group: Individual Therapy  [name], Solara Goodchild C 05/28/2016, 6:59 AM
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
## 2016-05-28 NOTE — Progress Notes (Signed)
Physical Therapy Session Note  Patient Details  Name: [name] MRN: [id number] Date of Birth: 1961/01/11  Today's Date: 05/28/2016 PT Individual Time: 1015-1103 PT Individual Time Calculation (min): 48 min   Short Term Goals: Week 2:  PT Short Term Goal 1 (Week 2): Pt will perform bed mobility modI PT Short Term Goal 2 (Week 2): Pt will perform stand pivot transfers with consistent S PT Short Term Goal 3 (Week 2): Pt will ambulate x50' with min guard PT Short Term Goal 4 (Week 2): Pt will ascend/descend 1+1 curb step with RW and minA  Skilled Therapeutic Interventions/Progress Updates:   Patient in wheelchair upon arrival, c/o increased low back pain 8/10 (premedicated) after "hard workout with therapy" yesterday. Patient propelled wheelchair to gym using BUE with increased time. Sit <> stand transfers using armrests and RW with supervision. Patient negotiated obstacle course using RW including up ramp, over thresholds, up/down 4" curb step, and weaving between cones with min A overall and 1 seated rest break. Patient c/o increased strain on low back when lifting RW up/down. Performed NuStep using BUE/BLE at level 4 x 12 min for strengthening and endurance. Patient required multiple prolonged rest breaks due to increased back pain and fatigue. Patient handoff to next PT.   Therapy Documentation Precautions:  Precautions Precautions: Back, Fall Precaution Booklet Issued: No Precaution Comments: Pt unable to recall; requires cues for functional adherence Required Braces or Orthoses: Spinal Brace Spinal Brace: Thoracolumbosacral orthotic, Applied in sitting position Restrictions Weight Bearing Restrictions: No   See Function Navigator for Current Functional Status.   Therapy/Group: Individual Therapy  Makennah Omura, Murray Hodgkins 05/28/2016, 11:07 AM
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
## 2016-05-28 NOTE — Progress Notes (Addendum)
Physical Therapy Note  Patient Details  Name: [name] MRN: [id number] Date of Birth: 02-16-1961 Today's Date: 05/28/2016    Time: 1103-1200 57 minutes  1:2 No c/o pain.  Session focused on strengthening and activity tolerance goals. Pt performed nu step x 16 minutes for LE strengthening. UBE x 10 minutes level 3 with alternating fwd/bkwd.  Seated therex for LE strengthening ankle PF/DF, hip abd/add, knee ext all 2 x 20.   DONAWERTH,KAREN 05/28/2016, 12:49 PM

## 2016-05-29 ENCOUNTER — Inpatient Hospital Stay (HOSPITAL_COMMUNITY): Payer: Medicaid Other | Admitting: Physical Therapy

## 2016-05-29 ENCOUNTER — Inpatient Hospital Stay (HOSPITAL_COMMUNITY): Payer: Medicaid Other | Admitting: Occupational Therapy

## 2016-05-29 ENCOUNTER — Ambulatory Visit: Payer: Medicaid Other

## 2016-05-29 NOTE — Progress Notes (Signed)
[place] PHYSICAL MEDICINE & REHABILITATION     PROGRESS NOTE    Subjective/Complaints: Pleased with his progress. Moments where pain increases. "if I just had something extra at those moments, it might help". Thinks he might not have gotten his oxy cr. Documentation demonstrates otherwise..    Review of Systems  Cardiovascular: Negative for palpitations.  Gastrointestinal: Negative for abdominal pain.  Genitourinary: Positive for frequency.  Musculoskeletal: Positive for back pain, joint pain and myalgias.  Neurological: Positive for sensory change.        Objective: Vital Signs: Blood pressure (!) 121/57, pulse 64, temperature 98.2 F (36.8 C), temperature source Oral, resp. rate 18, height 5\' 10"  (1.778 m), weight 92.3 kg (203 lb 8 oz), SpO2 100 %. No results found. No results for input(s): WBC, HGB, HCT, PLT in the last 72 hours. No results for input(s): NA, K, CL, GLUCOSE, BUN, CREATININE, CALCIUM in the last 72 hours.  Invalid input(s): CO CBG (last 3)  No results for input(s): GLUCAP in the last 72 hours.  Wt Readings from Last 3 Encounters:  05/28/16 92.3 kg (203 lb 8 oz)  05/10/16 100.6 kg (221 lb 11.2 oz)  05/08/16 96.6 kg (213 lb)    Physical Exam:  Constitutional: He is oriented to person, place, and time. He appears well-developedand well-nourished.    HENT:  Head: Normocephalicand atraumatic.  Eyes: conjunctivae injected/some blood.   Neck: Normal range of motion. Neck supple. No tracheal deviationpresent. No thyromegalypresent.  Cardiovascular: RRR Respiratory:  Normal effort.  GI: Soft. Bowel sounds are normal. He exhibits no distension. There is no tenderness.  Musculoskeletal: tr  edema bilateral LE's.  Neurological: He is alertand oriented to person, place, and time.  UE motor nearly 5/5 with ongoing pain inhibition due to back pain. LE: 3/5 HF, 3+/5 KE and 3 to 3+/5 bilateral ADF/PF with an element of pain inhibition also. Decreased LT  below chest although still can sense pain.    Skin: wound healing, but still some fibronecrotic/greenish material can be expressed from flap of wound   Abrasions on buttock,legs/arms all healing.  Psych: pleasant and cooperative. A little anxious  Assessment/Plan: 1. Weakness, functional deficits secondary to T12 burst fracture/polytrauma with paraplegia which require 3+ hours per day of interdisciplinary therapy in a comprehensive inpatient rehab setting. Physiatrist is providing close team supervision and 24 hour management of active medical problems listed below. Physiatrist and rehab team continue to assess barriers to discharge/monitor patient progress toward functional and medical goals.  Function:  Bathing Bathing position   Position: Shower  Bathing parts Body parts bathed by patient: Right arm, Left arm, Chest, Abdomen, Front perineal area, Right upper leg, Left upper leg, Back, Buttocks, Right lower leg, Left lower leg Body parts bathed by helper: Left lower leg, Right lower leg, Buttocks  Bathing assist Assist Level: Set up   Set up : To obtain items  Upper Body Dressing/Undressing Upper body dressing   What is the patient wearing?: Pull over shirt/dress, Orthosis     Pull over shirt/dress - Perfomed by patient: Thread/unthread right sleeve, Thread/unthread left sleeve, Put head through opening, Pull shirt over trunk Pull over shirt/dress - Perfomed by helper: Pull shirt over trunk     Orthosis activity level: Performed by helper  Upper body assist Assist Level: Set up   Set up : To apply TLSO, cervical collar  Lower Body Dressing/Undressing Lower body dressing   What is the patient wearing?: Pants, Ted Hose     Pants-  Performed by patient: Thread/unthread right pants leg, Thread/unthread left pants leg, Pull pants up/down Pants- Performed by helper: Pull pants up/down, Thread/unthread right pants leg, Thread/unthread left pants leg Non-skid slipper socks-  Performed by patient: Don/doff right sock, Don/doff left sock Non-skid slipper socks- Performed by helper: Don/doff right sock, Don/doff left sock   Socks - Performed by helper: Don/doff right sock Shoes - Performed by patient: Don/doff right shoe, Don/doff left shoe Shoes - Performed by helper: Fasten right, Fasten left       TED Hose - Performed by helper: Don/doff right TED hose, Don/doff left TED hose  Lower body assist Assist for lower body dressing: Touching or steadying assistance (Pt > 75%) Assistive Device Comment: sock aid Set up : Don/doff TED stockings  Toileting Toileting   Toileting steps completed by patient: Adjust clothing prior to toileting, Adjust clothing after toileting Toileting steps completed by helper: Performs perineal hygiene, Adjust clothing after toileting Toileting Assistive Devices: Grab bar or rail  Toileting assist Assist level:  (total assist)   Transfers Chair/bed transfer   Chair/bed transfer method: Ambulatory Chair/bed transfer assist level: Supervision or verbal cues Chair/bed transfer assistive device: Armrests, Medical sales representative     Max distance: 25 ft Assist level: Touching or steadying assistance (Pt > 75%)   Wheelchair   Type: Manual Max wheelchair distance: 150 ft Assist Level: No help, No cues, assistive device, takes more than reasonable amount of time  Cognition Comprehension Comprehension assist level: Follows complex conversation/direction with extra time/assistive device  Expression Expression assist level: Expresses complex ideas: With extra time/assistive device  Social Interaction Social Interaction assist level: Interacts appropriately with others with medication or extra time (anti-anxiety, antidepressant).  Problem Solving Problem solving assist level: Solves basic problems with no assist  Memory Memory assist level: Recognizes or recalls 90% of the time/requires cueing < 10% of the time   Medical  Problem List and Plan: 1. Functional and mobility deficitssecondary to multiple spinal fractures including T12 Burst fracture with incomplete paraplegia -continue CIR therapies.   -  2. DVT Prophylaxis/Anticoagulation: Mechanical: Sequential compression devices, below knee Bilateral lower extremities  -continue sq lovenox 40 daily  -hold if any signs of hematuria/bleeding 3. Pain Management: Continue Oxy IR with scheduled robaxin and ultram. oxycontin---continue at 30 q12   - Decreased ultram due to liver disease.   -continue Local measures with heat/ice also---I expressed to him that he needs to use alternative means for pain control  -lyrica for neuropathy  .  4. Mood: LCSW to follow for evaluation and support.  5. Neuropsych: This patient iscapable of making decisions on hisown behalf. 6. Skin/Wound Care:    -appreciate surgery follow up. Staples out  -antibiotic ointment     -still concerned that flap area may need some debridement---continues to drain 7. Fluids/Electrolytes/Nutrition: Monitor I/O.   -sodium up to 129 most recently  -  Lasix held  -encourage PO  8. Prostate cancer: Was undergoing weekly XRT PTA. (THESE ARE NOW ON HOLD) 9. Bladder neck contracture with retention: voiding trial./condom continues to keep dry 10. Mild corneal abrasions OD: Poor vision right eye. Showing some improvement 11. ABLA: hgb 8.4 which is a decrease     12. Cirrhosis of liver: Thrombocytopenia resolving. Reports that he was onxifamin and lactulose but was not taking as prescribed--managed by Dr. Watt Climes. Requested to have family bring in meds.   -ammonia level 34 recently 7. Bilateral lung contusions with effusions:continue IS to help with  SOB/mobilize secretions.  14. Hematuria/bladder: no gross hematuria but moderate hgb in blood.   -continue voiding trial  -condom cath to continue even once home  -abx as below 15. Leukocytosis:  -wbc's down to 7.3   - ucx with  only multispecies  -    LOS (Days) 13 A FACE TO FACE EVALUATION WAS PERFORMED  Meredith Staggers, MD 05/29/2016 8:29 AM

## 2016-05-29 NOTE — Progress Notes (Signed)
Physical Therapy Session Note  Patient Details  Name: [name] MRN: [id number] Date of Birth: Jun 17, 1960  Today's Date: 05/29/2016 PT Individual Time: [phone] and 1400-1505 PT Individual Time Calculation (min): 35 min and 65 min   Short Term Goals: Week 2:  PT Short Term Goal 1 (Week 2): Pt will perform bed mobility modI PT Short Term Goal 2 (Week 2): Pt will perform stand pivot transfers with consistent S PT Short Term Goal 3 (Week 2): Pt will ambulate x50' with min guard PT Short Term Goal 4 (Week 2): Pt will ascend/descend 1+1 curb step with RW and minA  Skilled Therapeutic Interventions/Progress Updates:   Treatment 1: Patient in bed upon arrival, transferred supine > sit EOB using rail with HOB raised with mod I to don TLSO with setup/min A to tighten. Donned TED hose and shoes total A sitting EOB. Sit <> stand transfers using RW with UE support with supervision. Gait training x 230 ft from room to therapy gym using RW with close  supervision and slow gait speed. Seated kinetron at 50 cm/sec, 2 x 2.5 min for BLE neuro re-ed and forced use. Patient left sitting in wheelchair with all needs within reach.   Treatment 2: Patient in therapy gym, handoff from OT. Patient reporting fatigue and sadness due to difficulty of performing sit <> stands without UE support with OT. Performed sit <> stand using RW with min guard initially progressed to supervision. Performed UBE at level 4 x 12 min alternating forward/backward. Patient propelled wheelchair using BUE on unit with increased time. Performed ambulatory transfer to simulated car at [place] with close supervision and seat reclined to allow patient to lift lower extremities in/out of car using upper extremities without breaking back precautions. Patient elected to fold clean laundry and put away in dresser from wheelchair level using reacher as needed with verbal cues to adhere to back precautions. Patient required multiple  rest breaks throughout session due to fatigue. Patient left sitting in wheelchair with all needs within reach.   Therapy Documentation Precautions:  Precautions Precautions: Back, Fall Precaution Booklet Issued: No Precaution Comments: Pt unable to recall; requires cues for functional adherence Required Braces or Orthoses: Spinal Brace Spinal Brace: Thoracolumbosacral orthotic, Applied in sitting position Restrictions Weight Bearing Restrictions: No Pain: Pain Assessment Pain Assessment: 0-10 Pain Score: 8  Pain Type: Acute pain Pain Location: Back Pain Orientation: Lower Pain Descriptors / Indicators: Aching Pain Onset: On-going Pain Intervention(s): Repositioned;Rest   See Function Navigator for Current Functional Status.   Therapy/Group: Individual Therapy  Raneisha Bress, Murray Hodgkins 05/29/2016, 10:03 AM

## 2016-05-29 NOTE — Progress Notes (Signed)
Occupational Therapy Session Note  Patient Details  Name: [name] MRN: [id number] Date of Birth: 1960-05-08  Today's Date: 05/29/2016 OT Individual Time:1100-1200 and 1300-1400 OT Individual Time Calculation (min): 60 min and 60 min   Short Term Goals: Week 2:  OT Short Term Goal 1 (Week 2): STG=LTG due to LOS  Skilled Therapeutic Interventions/Progress Updates:    Session One: Pt seen for OT session focusing on ADL/ IADL retraining, education, and functional activity tolerance. Pt sitting up in w/c upon arrival, voicing having already bathed/dressed and ready for tx session.  He completed grooming task in standing at sink with UE support on sink ledge and VCs for upright posture. He ambulated in room with RW to gather clothing items, using reacher to obtain items from low drawer. VCs provided for functional problem solving. Completed x2 trials with seated rest break btwn trials as pt fatigued and laundry bag filled full. Pt ambulated into pt laundry facility and completed laundry task with close supervision, VCs for back pre-cautions and educate regarding lifting restrictions.  Pt ambulated back to room, requiring one seated rest break due to decreased activity tolerance. Pt left seated in w/c at end of session, all needs in reach.  Pt with questions regarding DME at d/c. Explained reasoning for recommendation of transport chair, continuum of care, pt progress, and d/c planning. Pt voiced understanding of therapist's (OT&PT) recommendations for DME.   Session Two: Pt seen for OT session focusing on IADL re-training, functional activity tolerance, and transfers. Pt sitting up in w/c upon arrival, agreeable to tx session. He ambulated with RW and supervision to pt laundry room. Following  He switched laundry from washer to dryer using reacher. Required seated rest break during laundry task before completing task. He self propelled w/c to ADL apartment, completed stand pivot transfer to  low soft surface couch. PT able to stand with supervision and increased time/ effort. Pt stated goal of ambulating without AD in next 2 moths. In therapy gym, practiced sit <> stand from EOM without AD/ use of UEs. Completed x3 trials with CGA improving to supervision with increased effort required. Pt required increased concentration with static standing balance without UE support/ Educated extensively regarding need to wait for PT/OT guidance in advancing with mobility.  Pt left seated on EOM at end of session with hand off to PT.  Throughout session, pt requiring cues for w/c part management, poor carry over of education for technique for removing leg rests etc.    Therapy Documentation Precautions:  Precautions Precautions: Back, Fall Precaution Booklet Issued: No Precaution Comments: Pt unable to recall; requires cues for functional adherence Required Braces or Orthoses: Spinal Brace Spinal Brace: Thoracolumbosacral orthotic, Applied in sitting position Restrictions Weight Bearing Restrictions: No Pain: Pain Assessment Pain Assessment: 0-10 Pain Score: 8 Pain Type: Acute pain Pain Location: Back Pain Orientation: Lower Pain Descriptors / Indicators: Aching;Discomfort Pain Frequency: Constant Pain Onset: On-going Patients Stated Pain Goal: 6 Pain Intervention(s): RN aware ADL: ADL ADL Comments: See Functional Assessment Tool  See Function Navigator for Current Functional Status.   Therapy/Group: Individual Therapy  [name], Clarkson Rosselli C 05/29/2016, 6:44 AM

## 2016-05-29 NOTE — Plan of Care (Signed)
Problem: RH Bathing Goal: LTG Patient will bathe with assist, cues/equipment (OT) LTG: Patient will bathe specified number of body parts with assist with/without cues using equipment (position)  (OT)  Goal upgraded 2/1 due to pt progress. AL

## 2016-05-30 ENCOUNTER — Inpatient Hospital Stay (HOSPITAL_COMMUNITY): Payer: Medicaid Other | Admitting: Occupational Therapy

## 2016-05-30 ENCOUNTER — Ambulatory Visit: Payer: Medicaid Other

## 2016-05-30 ENCOUNTER — Ambulatory Visit (HOSPITAL_COMMUNITY): Payer: Medicaid Other | Admitting: Physical Therapy

## 2016-05-30 ENCOUNTER — Inpatient Hospital Stay (HOSPITAL_COMMUNITY): Payer: Medicaid Other | Admitting: Physical Therapy

## 2016-05-30 MED ORDER — PREGABALIN 50 MG PO CAPS
50.0000 mg | ORAL_CAPSULE | Freq: Two times a day (BID) | ORAL | Status: AC
  Administered 2016-05-30 – 2016-06-02 (×6): 50 mg via ORAL
  Filled 2016-05-30 (×6): qty 1

## 2016-05-30 MED ORDER — PREGABALIN 50 MG PO CAPS
50.0000 mg | ORAL_CAPSULE | Freq: Every day | ORAL | Status: DC
  Administered 2016-06-03 – 2016-06-04 (×2): 50 mg via ORAL
  Filled 2016-05-30 (×2): qty 1

## 2016-05-30 MED ORDER — OXYCODONE HCL 5 MG PO TABS
5.0000 mg | ORAL_TABLET | ORAL | Status: DC | PRN
  Administered 2016-05-30 – 2016-06-04 (×26): 10 mg via ORAL
  Filled 2016-05-30 (×27): qty 2

## 2016-05-30 NOTE — Progress Notes (Signed)
Discussed medicaid restrictions, concerns about coverage as well as issues with concerns about narcotic dependence and abuse. He is 3+ weeks out and discussed need to spread out out oxycodone to qid. He was agreeable to decrease dose to 10 mg qid.

## 2016-05-30 NOTE — Progress Notes (Signed)
Occupational Therapy Session Note  Patient Details  Name: [name] MRN: [id number] Date of Birth: May 26, 1960  Today's Date: 05/30/2016 OT Individual Time: [id number] and 1300-1400 OT Individual Time Calculation (min): 56 min and 60 min   Short Term Goals: Week 2:  OT Short Term Goal 1 (Week 2): STG=LTG due to LOS  Skilled Therapeutic Interventions/Progress Updates:    Session One: Pt seen for OT ADL bathing/dressing session. Pt asleep in supine, easily awoken and agreeable to tx session Pt desiring to bathe at shower level.  He transferred to EOB with supervision using bed rails. He ambulated throughout session with close supervision. He bathed seated on 3-1 BSC with set-up assist.  He then ambulated to toilet for attempt at [facility]. With assist of NT, education and demonstration provided for use of changing condom cath in prep for d/Cook. Attempted various position's for pt to complete buttock hygiene following BM as pt limited with spinal pre-cautions and TLSO. From seated position, pt able to reach from front to completed hygiene. Therapist provided assist for thoroughness.  Therapist assisted with LB dressing due to time restraints. Pt left seated in w/Cook at end of session with MD present completing morning [name].   Session Two: Pt seen for OT session focusing on family education with pt's sisters. During session reviewed and demonstrated the following:  Simulated tub/shower transfer utilizing tub bench  Set-up/ construction of tub transfer bench  Use of 3-1BSC over toilet for toileting need  Donning/doffing of TLSO  Cleaning of TLSO brace pads  Wearing schedule of TLSO  Bed mobility on standard bed in ADL apartment and use of log rolling techniques  Back pre-cautions  Continuum of care  What to do in case of fall All questions answered. Pt and pt's sister feeling comfortable and confident for planned d/Cook home next week.  Pt's sisters cont to request home health aid to come and  assist pt, even if just needed one time. Will make CSW aware. Reviewed OT/PT goals and recommendation for supervision as well as importance of participation with ADLs, and benefits of OOB/ mobility. Pt ambulated from ADL apartment back to room with supervision at end of session, left seated in w/Cook with caregiver present.   Therapy Documentation Precautions:  Precautions Precautions: Back, Fall Precaution Booklet Issued: No Precaution Comments: Pt unable to recall; requires cues for functional adherence Required Braces or Orthoses: Spinal Brace Spinal Brace: Thoracolumbosacral orthotic, Applied in sitting position Restrictions Weight Bearing Restrictions: No Pain: Pain Assessment Pain Score: 8  Pain Location: Back Pain Orientation: Lower Pain Descriptors / Indicators: Aching Pain Intervention(s): Medication (See eMAR);RN made aware;Shower;Ambulation/increased activity;Repositioned ADL: ADL ADL Comments: See Functional Assessment Tool  See Function Navigator for Current Functional Status.   Therapy/Group: Individual Therapy  [name] 05/30/2016, 7:06 AM

## 2016-05-30 NOTE — Progress Notes (Signed)
[place] PHYSICAL MEDICINE & REHABILITATION     PROGRESS NOTE    Subjective/Complaints: No new issues. Trying to finish dressing when I arrived. Pain controlled at present   Review of Systems  Respiratory: Negative for cough.   Gastrointestinal: Negative for abdominal pain.  Genitourinary: Positive for frequency.  Musculoskeletal: Positive for back pain, joint pain and myalgias.  Neurological: Positive for sensory change.        Objective: Vital Signs: Blood pressure 127/63, pulse 64, temperature 98.6 F (37 C), temperature source Oral, resp. rate 18, height 5\' 10"  (1.778 m), weight 92.3 kg (203 lb 8 oz), SpO2 100 %. No results found. No results for input(s): WBC, HGB, HCT, PLT in the last 72 hours. No results for input(s): NA, K, CL, GLUCOSE, BUN, CREATININE, CALCIUM in the last 72 hours.  Invalid input(s): CO CBG (last 3)  No results for input(s): GLUCAP in the last 72 hours.  Wt Readings from Last 3 Encounters:  05/28/16 92.3 kg (203 lb 8 oz)  05/10/16 100.6 kg (221 lb 11.2 oz)  05/08/16 96.6 kg (213 lb)    Physical Exam:  Constitutional: He is oriented to person, place, and time. He appears well-developedand well-nourished.    HENT:  Head: Normocephalicand atraumatic.  Eyes: conjunctivae injected/some blood.   Neck: Normal range of motion. Neck supple. No tracheal deviationpresent. No thyromegalypresent.  Cardiovascular: RRR Respiratory:  Normal.  GI: Soft. Bowel sounds are normal. He exhibits no distension. There is no tenderness.  Musculoskeletal: tr  edema bilateral LE's.  Neurological: He is alertand oriented to person, place, and time.  UE motor nearly 5/5 with ongoing pain inhibition due to back pain. LE: 3+/5 HF, 3+/5 KE and 3 to 3+/5 bilateral ADF/PF with an element of pain inhibition also. Decreased LT below chest although still can sense pain.    Skin: wound healing, fibronecrotic tissue generally gone. Wound pink/closing Abrasions on  buttock,legs/arms all healing.  Psych: pleasant and cooperative.   Assessment/Plan: 1. Weakness, functional deficits secondary to T12 burst fracture/polytrauma with paraplegia which require 3+ hours per day of interdisciplinary therapy in a comprehensive inpatient rehab setting. Physiatrist is providing close team supervision and 24 hour management of active medical problems listed below. Physiatrist and rehab team continue to assess barriers to discharge/monitor patient progress toward functional and medical goals.  Function:  Bathing Bathing position   Position: Shower  Bathing parts Body parts bathed by patient: Right arm, Left arm, Chest, Abdomen, Front perineal area, Right upper leg, Left upper leg, Back, Buttocks, Right lower leg, Left lower leg Body parts bathed by helper: Left lower leg, Right lower leg, Buttocks  Bathing assist Assist Level: Set up   Set up : To obtain items  Upper Body Dressing/Undressing Upper body dressing   What is the patient wearing?: Pull over shirt/dress, Orthosis     Pull over shirt/dress - Perfomed by patient: Thread/unthread right sleeve, Thread/unthread left sleeve, Put head through opening, Pull shirt over trunk Pull over shirt/dress - Perfomed by helper: Pull shirt over trunk     Orthosis activity level: Performed by helper  Upper body assist Assist Level: Set up   Set up : To apply TLSO, cervical collar  Lower Body Dressing/Undressing Lower body dressing   What is the patient wearing?: Pants, Ted Hose     Pants- Performed by patient: Thread/unthread right pants leg, Thread/unthread left pants leg, Pull pants up/down Pants- Performed by helper: Pull pants up/down, Thread/unthread right pants leg, Thread/unthread left pants leg Non-skid  slipper socks- Performed by patient: Don/doff right sock, Don/doff left sock Non-skid slipper socks- Performed by helper: Don/doff right sock, Don/doff left sock   Socks - Performed by helper: Don/doff  right sock Shoes - Performed by patient: Don/doff right shoe, Don/doff left shoe Shoes - Performed by helper: Fasten right, Fasten left       TED Hose - Performed by helper: Don/doff right TED hose, Don/doff left TED hose  Lower body assist Assist for lower body dressing: Touching or steadying assistance (Pt > 75%) Assistive Device Comment: sock aid Set up : Don/doff TED stockings  Toileting Toileting   Toileting steps completed by patient: Adjust clothing prior to toileting, Adjust clothing after toileting Toileting steps completed by helper: Performs perineal hygiene, Adjust clothing after toileting Toileting Assistive Devices: Grab bar or rail  Toileting assist Assist level:  (total assist)   Transfers Chair/bed transfer   Chair/bed transfer method: Ambulatory Chair/bed transfer assist level: Supervision or verbal cues Chair/bed transfer assistive device: Armrests, Medical sales representative     Max distance: 230 ft Assist level: Supervision or verbal cues   Wheelchair   Type: Manual Max wheelchair distance: > 150 ft Assist Level: No help, No cues, assistive device, takes more than reasonable amount of time  Cognition Comprehension Comprehension assist level: Follows complex conversation/direction with extra time/assistive device  Expression Expression assist level: Expresses complex ideas: With extra time/assistive device  Social Interaction Social Interaction assist level: Interacts appropriately with others with medication or extra time (anti-anxiety, antidepressant).  Problem Solving Problem solving assist level: Solves basic problems with no assist  Memory Memory assist level: Recognizes or recalls 90% of the time/requires cueing < 10% of the time   Medical Problem List and Plan: 1. Functional and mobility deficitssecondary to multiple spinal fractures including T12 Burst fracture with incomplete paraplegia -continue CIR therapies.   -  2.  DVT Prophylaxis/Anticoagulation: Mechanical: Sequential compression devices, below knee Bilateral lower extremities  -continue sq lovenox 40 daily  -hold if any signs of hematuria/bleeding 3. Pain Management: Continue Oxy IR with scheduled robaxin and ultram. oxycontin---continue at 30 q12   - Decreased ultram due to liver disease.   -continue Local measures with heat/ice also---I expressed to him that he needs to use alternative means for pain control  -lyrica for neuropathy  .  4. Mood: LCSW to follow for evaluation and support.  5. Neuropsych: This patient iscapable of making decisions on hisown behalf. 6. Skin/Wound Care:    -antibiotic ointment     -fortunately, wound appears to be healing/closing 7. Fluids/Electrolytes/Nutrition: Monitor I/O.   -sodium up to 129 most recently  -  Lasix held  -encourage PO  -recheck bmet monday  8. Prostate cancer: Was undergoing weekly XRT PTA. (THESE ARE NOW ON HOLD) 9. Bladder neck contracture with retention: voiding trial./condom continues to keep dry 10. Mild corneal abrasions OD: Poor vision right eye. Showing some improvement 11. ABLA: hgb 8.4 which is a decrease     12. Cirrhosis of liver: Thrombocytopenia resolving. Reports that he was onxifamin and lactulose but was not taking as prescribed--managed by Dr. Watt Climes. Requested to have family bring in meds.   -ammonia level 34 recently 50. Bilateral lung contusions with effusions:continue IS to help with SOB/mobilize secretions.  14. Hematuria/bladder: no gross hematuria but moderate hgb in blood.   -continue voiding trial  -condom cath to continue even once home  -abx as below 15. Leukocytosis:  -wbc's down to 7.3   -  ucx with only multispecies  -    LOS (Days) 14 A FACE TO FACE EVALUATION WAS PERFORMED  Meredith Staggers, MD 05/30/2016 9:47 AM

## 2016-05-30 NOTE — Progress Notes (Signed)
Discussed condom cath care and skin interventions to prevent breakdown. Discussed medications and importance of follow up visits once d/c from hospital.  Pt receptive to suggestions at this time, Continue plan of care

## 2016-05-30 NOTE — Progress Notes (Signed)
Physical Therapy Session Note  Patient Details  Name: [name] MRN: [id number] Date of Birth: [birth year]  Today's Date: 05/30/2016 PT Individual Time: 0915-1000 and 1400-1430 PT Individual Time Calculation (min): 45 min and 30 min (total 75 min)   Short Term Goals: Week 2:  PT Short Term Goal 1 (Week 2): Pt will perform bed mobility modI PT Short Term Goal 2 (Week 2): Pt will perform stand pivot transfers with consistent S PT Short Term Goal 3 (Week 2): Pt will ambulate x50' with min guard PT Short Term Goal 4 (Week 2): Pt will ascend/descend 1+1 curb step with RW and minA  Skilled Therapeutic Interventions/Progress Updates: Tx 1: Pt received seated in w/c, c/o pain as below and agreeable to treatment. Gait with RW to gym x175' with S; improving gait speed and foot clearance. Performed ascent/descent two 4" steps with RW and S to simulate home environment. Discussed with pt importance of eccentric control through UEs and opposite LE to lower to floor with slow speed to avoid pain which pt reports has been occurring with step down. Performed nustep x12 min with BUE/BLE for strengthening and endurance. Gait x150' with RW and S. Step up onto scale with min guard to weigh per pt request. Remained seated in w/c to propel back to room modI at end of session.   Tx 2: Pt received seated in w/c with sisters [name] and [name] present for family education; c/o increased pain due to pain medication taper. Performed car transfer with RW and S ambulatory transfer. Ascent/descent 4 stairs with B handrails and S. Discussed recommendation for S when pt returns home to allow pt to adapt to home environment; encouraged family to maintain clean walk ways and adequate lighting to reduce risk of falls. Discussed energy conservation techniques to reduce urgency and risk of falls, recommended leaving extra time to get ready for appointments and outings. Family assured therapist "He's never in a hurry, and he always  walked slow before". Strongly recommended pt continue using RW due to high falls risk and LE strength/coordination deficits, and encouraged pt to continue working towards gait without AD with HHPT. Pt ambulated back to room x200' with RW and S. Remained seated in w/c at end of session, all needs in reach. Pt and family with no further questions at this time.      Therapy Documentation Precautions:  Precautions Precautions: Back, Fall Precaution Booklet Issued: No Precaution Comments: Pt unable to recall; requires cues for functional adherence Required Braces or Orthoses: Spinal Brace Spinal Brace: Thoracolumbosacral orthotic, Applied in sitting position Restrictions Weight Bearing Restrictions: No Pain: Pain Assessment Pain Score: 8  Pain Location: Back Pain Orientation: Lower Pain Descriptors / Indicators: Aching Pain Intervention(s): Medication (See eMAR);RN made aware;Shower;Ambulation/increased activity;Repositioned   See Function Navigator for Current Functional Status.   Therapy/Group: Individual Therapy  Luberta Mutter 05/30/2016, 10:04 AM

## 2016-05-31 ENCOUNTER — Inpatient Hospital Stay (HOSPITAL_COMMUNITY): Payer: Medicaid Other | Admitting: Occupational Therapy

## 2016-05-31 NOTE — Progress Notes (Signed)
Occupational Therapy Session Note  Patient Details  Name: [name] MRN: [id number] Date of Birth: 12-14-1960  Today's Date: 05/31/2016 OT Individual Time: [id number] OT Individual Time Calculation (min): 57 min    Short Term Goals: Week 2:  OT Short Term Goal 1 (Week 2): STG=LTG due to LOS  Skilled Therapeutic Interventions/Progress Updates:    Pt seen for OT session focusing on ADL re-training and functional ambulation/ activity tolerance. Pt in supine upon arrival, ready to get OOB. He transferred to EOB using bed rails. Seated EOB, he used reacher to thread LEs  into pants, standing with supervision at RW to pull pants up. Assist required for donning TLSO and pull pants over brace. Pt able to independently manage catheter through LB clothing. He ambulated throughout room with supervision using RW, completed grooming tasks standing at sink, heavy reliance on UE support on sink ledge for balance and endurance.  He ambulated to therapy gym with supervision, VCs for upright posture and less reliance on UEs.  In therapy gym, completed sit<> stands from [facility] without UE support, completed with supervision. Pt voiced this task as much easier compared to when he attempted the other day and very excited about progress he is making. He ambulated back to room in same manner as descried above, left seated in w/c with all needs in reach. Reviewed need for nursing assistance when transferring outside of therapy time.   Therapy Documentation Precautions:  Precautions Precautions: Back, Fall Precaution Booklet Issued: No Precaution Comments: Pt unable to recall; requires cues for functional adherence Required Braces or Orthoses: Spinal Brace Spinal Brace: Thoracolumbosacral orthotic, Applied in sitting position Restrictions Weight Bearing Restrictions: No ADL: ADL ADL Comments: See Functional Assessment Tool  See Function Navigator for Current Functional Status.   Therapy/Group: Individual  Therapy  [name], Gwinda Passe 05/31/2016, 6:43 AM

## 2016-05-31 NOTE — Progress Notes (Signed)
[facility]IC[facility] REHABILITATION     PROGRESS NOTE    Subjective/Complaints: Patient states that he is doing better with his bowel and bladder   Review of Systems  Cardiovascular: Negative for palpitations.  Gastrointestinal: Negative for abdominal pain.  Genitourinary: Positive for frequency.  Musculoskeletal: Positive for back pain, joint pain and myalgias.  Neurological: Positive for sensory change.        Objective: Vital Signs: Blood pressure 109/61, pulse 63, temperature 98.3 F (36.8 C), temperature source Oral, resp. rate 18, height 5\' 10"  (1.778 m), weight 92.3 kg (203 lb 8 oz), SpO2 99 %. No results found. No results for input(s): WBC, HGB, HCT, PLT in the last 72 hours. No results for input(s): NA, K, CL, GLUCOSE, BUN, CREATININE, CALCIUM in the last 72 hours.  Invalid input(s): CO CBG (last 3)  No results for input(s): GLUCAP in the last 72 hours.  Wt Readings from Last 3 Encounters:  05/28/16 92.3 kg (203 lb 8 oz)  05/10/16 100.6 kg (221 lb 11.2 oz)  05/08/16 96.6 kg (213 lb)    Physical Exam:  Constitutional: He is oriented to person, place, and time. He appears well-developedand well-nourished.    HENT:  Head: Normocephalicand atraumatic.  Eyes: conjunctivae injected/some blood.   Neck: Normal range of motion. Neck supple. No tracheal deviationpresent. No thyromegalypresent.  Cardiovascular: RRR Respiratory:  Normal effort.  GI: Soft. Bowel sounds are normal. He exhibits no distension. There is no tenderness.  Musculoskeletal: tr  edema bilateral LE's.  Neurological: He is alertand oriented to person, place, and time.  UE motor nearly 5/5 with ongoing pain inhibition due to back pain. LE: 3/5 HF, 3+/5 KE and 3 to 3+/5 bilateral ADF/PF with an element of pain inhibition also. Decreased LT below chest although still can sense pain.    Skin: wound healing, but still some fibronecrotic/greenish material can be expressed from flap of  wound   Abrasions on buttock,legs/arms all healing.  Psych: pleasant and cooperative. A little anxious  Assessment/Plan: 1. Weakness, functional deficits secondary to T12 burst fracture/polytrauma with paraplegia which require 3+ hours per day of interdisciplinary therapy in a comprehensive inpatient rehab setting. Physiatrist is providing close team supervision and 24 hour management of active medical problems listed below. Physiatrist and rehab team continue to assess barriers to discharge/monitor patient progress toward functional and medical goals.  Function:  Bathing Bathing position   Position: Shower  Bathing parts Body parts bathed by patient: Right arm, Left arm, Chest, Abdomen, Front perineal area, Right upper leg, Left upper leg, Back, Buttocks, Right lower leg, Left lower leg Body parts bathed by helper: Left lower leg, Right lower leg, Buttocks  Bathing assist Assist Level: Set up   Set up : To obtain items  Upper Body Dressing/Undressing Upper body dressing   What is the patient wearing?: Pull over shirt/dress, Orthosis     Pull over shirt/dress - Perfomed by patient: Thread/unthread right sleeve, Thread/unthread left sleeve, Put head through opening, Pull shirt over trunk Pull over shirt/dress - Perfomed by helper: Pull shirt over trunk     Orthosis activity level: Performed by helper  Upper body assist Assist Level: Set up   Set up : To apply TLSO, cervical collar  Lower Body Dressing/Undressing Lower body dressing   What is the patient wearing?: Pants, Ted Hose     Pants- Performed by patient: Thread/unthread right pants leg, Thread/unthread left pants leg, Pull pants up/down Pants- Performed by helper: Pull pants up/down, Thread/unthread  right pants leg, Thread/unthread left pants leg Non-skid slipper socks- Performed by patient: Don/doff right sock, Don/doff left sock Non-skid slipper socks- Performed by helper: Don/doff right sock, Don/doff left sock    Socks - Performed by helper: Don/doff right sock Shoes - Performed by patient: Don/doff right shoe, Don/doff left shoe Shoes - Performed by helper: Fasten right, Fasten left       TED Hose - Performed by helper: Don/doff right TED hose, Don/doff left TED hose  Lower body assist Assist for lower body dressing: Touching or steadying assistance (Pt > 75%) Assistive Device Comment: sock aid Set up : Don/doff TED stockings  Toileting Toileting   Toileting steps completed by patient: Adjust clothing prior to toileting, Adjust clothing after toileting Toileting steps completed by helper: Performs perineal hygiene, Adjust clothing after toileting Toileting Assistive Devices: Grab bar or rail  Toileting assist Assist level:  (total assist)   Transfers Chair/bed transfer   Chair/bed transfer method: Ambulatory Chair/bed transfer assist level: Supervision or verbal cues Chair/bed transfer assistive device: Armrests, Medical sales representative     Max distance: 175 Assist level: Supervision or verbal cues   Wheelchair   Type: Manual Max wheelchair distance: > 150 ft Assist Level: No help, No cues, assistive device, takes more than reasonable amount of time  Cognition Comprehension Comprehension assist level: Follows complex conversation/direction with extra time/assistive device  Expression Expression assist level: Expresses complex ideas: With extra time/assistive device  Social Interaction Social Interaction assist level: Interacts appropriately with others with medication or extra time (anti-anxiety, antidepressant).  Problem Solving Problem solving assist level: Solves basic problems with no assist  Memory Memory assist level: Recognizes or recalls 90% of the time/requires cueing < 10% of the time   Medical Problem List and Plan: 1. Functional and mobility deficitssecondary to multiple spinal fractures including T12 Burst fracture with incomplete  paraplegia -continue CIR therapies. PT, OT with plans of discharging next week  -  2. DVT Prophylaxis/Anticoagulation: Mechanical: Sequential compression devices, below knee Bilateral lower extremities  -continue sq lovenox 40 daily  -hold if any signs of hematuria/bleeding 3. Pain Management: Continue Oxy IR with scheduled robaxin and ultram. oxycontin---continue at 30 q12   - Decreased ultram due to liver disease.   -continue Local measures with heat/ice also---I expressed to him that he needs to use alternative means for pain control  -lyrica for neuropathy  .  4. Mood: LCSW to follow for evaluation and support.  5. Neuropsych: This patient iscapable of making decisions on hisown behalf. 6. Skin/Wound Care:    -appreciate surgery follow up. Staples out  -antibiotic ointment     drain 7. Fluids/Electrolytes/Nutrition: Monitor I/O.   -sodium up to 129 most recently  -  Lasix held  -encourage PO  8. Prostate cancer: Was undergoing weekly XRT PTA. (THESE ARE NOW ON HOLD) 9. Bladder neck contracture with retention: voiding trial./condom continues to keep dry, contributing to issues with voiding 10. Mild corneal abrasions OD: Poor vision right eye. Showing some improvement 11. ABLA: hgb 8.4 which is a decrease     12. Cirrhosis of liver: Thrombocytopenia resolving. Reports that he was onxifamin and lactulose but was not taking as prescribed--managed by Dr. Watt Climes. Requested to have family bring in meds.   -ammonia level 34 recently 55. Bilateral lung contusions with effusions:continue IS to help with SOB/mobilize secretions.  14. Hematuria/bladder: no gross hematuria but moderate hgb in blood.   -continue voiding trial  -condom cath to continue  even once home  -abx as below 15. Leukocytosis: Resolved   -    LOS (Days) 15 A FACE TO FACE EVALUATION WAS PERFORMED  Charlett Blake, MD 05/31/2016 10:46 AM

## 2016-06-01 ENCOUNTER — Inpatient Hospital Stay (HOSPITAL_COMMUNITY): Payer: Medicaid Other | Admitting: Occupational Therapy

## 2016-06-01 MED ORDER — HYDROCORTISONE 2.5 % RE CREA
TOPICAL_CREAM | Freq: Three times a day (TID) | RECTAL | Status: DC
  Administered 2016-06-01: 1 via RECTAL
  Administered 2016-06-02 – 2016-06-04 (×3): via RECTAL
  Filled 2016-06-01 (×2): qty 28.35

## 2016-06-01 NOTE — Progress Notes (Signed)
[facility] PHYSICAL MEDICINE & REHABILITATION     PROGRESS NOTE    Subjective/Complaints: Having problem with hemorrhoids had some bleeding this morning in the shower, but this stopped spontaneously. He is to follow-up with gastroenterology after discharge   Review of Systems  Cardiovascular: Negative for palpitations.  Gastrointestinal: Negative for abdominal pain.  Genitourinary: Positive for frequency.  Musculoskeletal: Positive for back pain, joint pain and myalgias.  Neurological: Positive for sensory change.        Objective: Vital Signs: Blood pressure 124/65, pulse 66, temperature 98.6 F (37 C), temperature source Oral, resp. rate 18, height 5\' 10"  (1.778 m), weight 92.3 kg (203 lb 8 oz), SpO2 97 %. No results found. No results for input(s): WBC, HGB, HCT, PLT in the last 72 hours. No results for input(s): NA, K, CL, GLUCOSE, BUN, CREATININE, CALCIUM in the last 72 hours.  Invalid input(s): CO CBG (last 3)  No results for input(s): GLUCAP in the last 72 hours.  Wt Readings from Last 3 Encounters:  05/28/16 92.3 kg (203 lb 8 oz)  05/10/16 100.6 kg (221 lb 11.2 oz)  05/08/16 96.6 kg (213 lb)    Physical Exam:  Constitutional: He is oriented to person, place, and time. He appears well-developedand well-nourished.    HENT:  Head: Normocephalicand atraumatic.  Eyes: conjunctivae injected/some blood.   Neck: Normal range of motion. Neck supple. No tracheal deviationpresent. No thyromegalypresent.  Cardiovascular: RRR Respiratory:  Normal effort.  GI: Soft. Bowel sounds are normal. He exhibits no distension. There is no tenderness.  Musculoskeletal: tr  edema bilateral LE's.  Neurological: He is alertand oriented to person, place, and time.  UE motor nearly 5/5 with ongoing pain inhibition due to back pain. LE: 3/5 HF, 3+/5 KE and 3 to 3+/5 bilateral ADF/PF with an element of pain inhibition also. Decreased LT below chest although still can sense pain.     Skin: wound healing, but still some fibronecrotic/greenish material can be expressed from flap of wound   Abrasions on buttock,legs/arms all healing.  Psych: pleasant and cooperative. A little anxious  Assessment/Plan: 1. Weakness, functional deficits secondary to T12 burst fracture/polytrauma with paraplegia which require 3+ hours per day of interdisciplinary therapy in a comprehensive inpatient rehab setting. Physiatrist is providing close team supervision and 24 hour management of active medical problems listed below. Physiatrist and rehab team continue to assess barriers to discharge/monitor patient progress toward functional and medical goals.  Function:  Bathing Bathing position   Position: Shower  Bathing parts Body parts bathed by patient: Right arm, Left arm, Chest, Abdomen, Front perineal area, Right upper leg, Left upper leg, Back, Buttocks, Right lower leg, Left lower leg Body parts bathed by helper: Left lower leg, Right lower leg, Buttocks  Bathing assist Assist Level: Set up   Set up : To obtain items  Upper Body Dressing/Undressing Upper body dressing   What is the patient wearing?: Pull over shirt/dress, Orthosis     Pull over shirt/dress - Perfomed by patient: Thread/unthread right sleeve, Thread/unthread left sleeve, Put head through opening, Pull shirt over trunk Pull over shirt/dress - Perfomed by helper: Pull shirt over trunk     Orthosis activity level: Performed by helper  Upper body assist Assist Level: Set up   Set up : To apply TLSO, cervical collar  Lower Body Dressing/Undressing Lower body dressing   What is the patient wearing?: Pants, Ted Hose, Shoes     Pants- Performed by patient: Thread/unthread right pants leg, Thread/unthread left  pants leg, Pull pants up/down Pants- Performed by helper: Pull pants up/down, Thread/unthread right pants leg, Thread/unthread left pants leg Non-skid slipper socks- Performed by patient: Don/doff right sock,  Don/doff left sock Non-skid slipper socks- Performed by helper: Don/doff right sock, Don/doff left sock   Socks - Performed by helper: Don/doff right sock Shoes - Performed by patient: Don/doff right shoe, Don/doff left shoe Shoes - Performed by helper: Fasten right, Fasten left       TED Hose - Performed by helper: Don/doff right TED hose, Don/doff left TED hose  Lower body assist Assist for lower body dressing: Touching or steadying assistance (Pt > 75%) Assistive Device Comment: sock aid Set up : Don/doff TED stockings  Toileting Toileting   Toileting steps completed by patient: Adjust clothing prior to toileting, Adjust clothing after toileting Toileting steps completed by helper: Performs perineal hygiene, Adjust clothing after toileting Toileting Assistive Devices: Grab bar or rail  Toileting assist Assist level:  (total assist)   Transfers Chair/bed transfer   Chair/bed transfer method: Ambulatory Chair/bed transfer assist level: Supervision or verbal cues Chair/bed transfer assistive device: Armrests, Medical sales representative     Max distance: 175 Assist level: Supervision or verbal cues   Wheelchair   Type: Manual Max wheelchair distance: > 150 ft Assist Level: No help, No cues, assistive device, takes more than reasonable amount of time  Cognition Comprehension Comprehension assist level: Follows complex conversation/direction with extra time/assistive device  Expression Expression assist level: Expresses complex ideas: With extra time/assistive device  Social Interaction Social Interaction assist level: Interacts appropriately with others with medication or extra time (anti-anxiety, antidepressant).  Problem Solving Problem solving assist level: Solves basic problems with no assist  Memory Memory assist level: Recognizes or recalls 90% of the time/requires cueing < 10% of the time   Medical Problem List and Plan: 1. Functional and mobility  deficitssecondary to multiple spinal fractures including T12 Burst fracture with incomplete paraplegia -continue CIR therapies. PT, OT with plans of discharging next week  -  2. DVT Prophylaxis/Anticoagulation: Mechanical: Sequential compression devices, below knee Bilateral lower extremities  -continue sq lovenox 40 daily  -hold if any signs of hematuria/bleeding 3. Pain Management: Continue Oxy IR with scheduled robaxin and ultram. oxycontin---continue at 30 q12   - Decreased ultram due to liver disease.   -continue Local measures with heat/ice also---I expressed to him that he needs to use alternative means for pain control  -lyrica for neuropathy  .  4. Mood: LCSW to follow for evaluation and support.  5. Neuropsych: This patient iscapable of making decisions on hisown behalf. 6. Skin/Wound Care:    -appreciate surgery follow up. Staples out  -antibiotic ointment     drain 7. Fluids/Electrolytes/Nutrition: Monitor I/O.   -sodium up to 129 most recently  -  Lasix held  -encourage PO  8. Prostate cancer: Was undergoing weekly XRT PTA. (THESE ARE NOW ON HOLD) 9. Bladder neck contracture with retention: voiding trial./condom continues to keep dry, contributing to issues with voiding 10. Mild corneal abrasions OD: Poor vision right eye. Showing some improvement 11. ABLA: hgb 8.4 which is a decrease   , may have some hemorrhoidal bleeding,  Recheck CBC in a.m. 12. Cirrhosis of liver: Thrombocytopenia resolving. Reports that he was onxifamin and lactulose but was not taking as prescribed--managed by Dr. Watt Climes. Requested to have family bring in meds.   -ammonia level 34 recently 17. Bilateral lung contusions with effusions:continue IS to help with SOB/mobilize  secretions.  14. Hematuria/bladder: no gross hematuria but moderate hgb in blood.   -continue voiding trial  -condom cath to continue even once home  -abx as below  15. History of hemorrhoids, was  scheduled to see gastroenterology for treatment. In the meantime, will start anusol cream   -    LOS (Days) 16 A FACE TO FACE EVALUATION WAS PERFORMED  Charlett Blake, MD 06/01/2016 10:51 AM

## 2016-06-01 NOTE — Progress Notes (Signed)
Occupational Therapy Session Note  Patient Details  Name: [name] MRN: [id number] Date of Birth: October 23, 1960  Today's Date: 06/01/2016 OT Individual Time: 8466-5993 OT Individual Time Calculation (min): 67 min    Short Term Goals: Week 1:  OT Short Term Goal 1 (Week 1): Complete bathing at shower level with mod assist using AE/DME, PRN OT Short Term Goal 1 - Progress (Week 1): Met OT Short Term Goal 2 (Week 1): Complete dressing using AE with mod assist  OT Short Term Goal 2 - Progress (Week 1): Met OT Short Term Goal 3 (Week 1): Don TLSO sitting at EOB with mod assist OT Short Term Goal 3 - Progress (Week 1): Met OT Short Term Goal 4 (Week 1): Perform BADL with min vc to adhere to back precautions OT Short Term Goal 4 - Progress (Week 1): Met Week 2:  OT Short Term Goal 1 (Week 2): STG=LTG due to LOS  Skilled Therapeutic Interventions/Progress Updates: Pt was lying in bed at time of arrival, agreeable to session but requesting pain medication. RN notified to provide pain meds prior to shower. Per RN request, left thigh bandage covered in shower. Pt then complete supine<sit with supervision via logroll technique. Min vcs for hand placement when ambulating into bathroom with RW (per MD, pt allowed to ambulate into bathroom without back brace). Once in shower, pt completed bathing with overall supervision with LH sponge, exhibited increased bleeding from hemorrhoids with puddle of blood gathering on floor. Pt also incontinent of bowels during shower. RN made aware of blood and incontinence, and also slightly open sore on right buttocks. Min A for pericare thoroughness.  Afterwards pt completed dressing while seated on tub bench with AE and supervision. Total A for Teds.3/3 recall of back precautions. With back brace donned, pt ambulated back into room and was left in w/c with all needs within reach.      Therapy Documentation Precautions:  Precautions Precautions: Back,  Fall Precaution Booklet Issued: No Precaution Comments: Pt unable to recall; requires cues for functional adherence Required Braces or Orthoses: Spinal Brace Spinal Brace: Thoracolumbosacral orthotic, Applied in sitting position Restrictions Weight Bearing Restrictions: No   Pain: Pt reported pain to be manageable with provided rest breaks  Pain Assessment Pain Assessment: 0-10 Pain Score: 8  Pain Type: Acute pain Pain Location: Back Pain Orientation: Lower ADL: ADL ADL Comments: See Functional Assessment Tool    See Function Navigator for Current Functional Status.   Therapy/Group: Individual Therapy  Antonyo Hinderer A Jay Haskew 06/01/2016, 12:20 PM

## 2016-06-02 ENCOUNTER — Inpatient Hospital Stay (HOSPITAL_COMMUNITY): Payer: Medicaid Other | Admitting: Physical Therapy

## 2016-06-02 ENCOUNTER — Inpatient Hospital Stay (HOSPITAL_COMMUNITY): Payer: Medicaid Other | Admitting: Occupational Therapy

## 2016-06-02 ENCOUNTER — Ambulatory Visit: Admission: RE | Admit: 2016-06-02 | Payer: Medicaid Other | Source: Ambulatory Visit

## 2016-06-02 LAB — CBC
HCT: 28.9 % — ABNORMAL LOW (ref 39.0–52.0)
HEMOGLOBIN: 9.4 g/dL — AB (ref 13.0–17.0)
MCH: 29.4 pg (ref 26.0–34.0)
MCHC: 32.5 g/dL (ref 30.0–36.0)
MCV: 90.3 fL (ref 78.0–100.0)
Platelets: 223 10*3/uL (ref 150–400)
RBC: 3.2 MIL/uL — ABNORMAL LOW (ref 4.22–5.81)
RDW: 15.2 % (ref 11.5–15.5)
WBC: 6.5 10*3/uL (ref 4.0–10.5)

## 2016-06-02 LAB — BASIC METABOLIC PANEL
Anion gap: 7 (ref 5–15)
BUN: 14 mg/dL (ref 6–20)
CHLORIDE: 98 mmol/L — AB (ref 101–111)
CO2: 25 mmol/L (ref 22–32)
CREATININE: 0.89 mg/dL (ref 0.61–1.24)
Calcium: 8.8 mg/dL — ABNORMAL LOW (ref 8.9–10.3)
GFR calc Af Amer: >60 mL/min (ref >60)
GFR calc non Af Amer: >60 mL/min (ref >60)
Glucose, Bld: 107 mg/dL — ABNORMAL HIGH (ref 65–99)
Potassium: 3.7 mmol/L (ref 3.5–5.1)
SODIUM: 130 mmol/L — AB (ref 135–145)

## 2016-06-02 MED ORDER — OXYCODONE HCL ER 10 MG PO T12A
20.0000 mg | EXTENDED_RELEASE_TABLET | Freq: Two times a day (BID) | ORAL | Status: DC
  Administered 2016-06-02 – 2016-06-04 (×4): 20 mg via ORAL
  Filled 2016-06-02 (×4): qty 2

## 2016-06-02 NOTE — Progress Notes (Signed)
Physical Therapy Session Note  Patient Details  Name: [name] MRN: [id number] Date of Birth: 01/18/1961  Today's Date: 06/02/2016 PT Individual Time: 0903-1000 and 1430-1530 PT Individual Time Calculation (min): 57 min and 60 min (total 117 min)   Short Term Goals: Week 2:  PT Short Term Goal 1 (Week 2): Pt will perform bed mobility modI PT Short Term Goal 2 (Week 2): Pt will perform stand pivot transfers with consistent S PT Short Term Goal 3 (Week 2): Pt will ambulate x50' with min guard PT Short Term Goal 4 (Week 2): Pt will ascend/descend 1+1 curb step with RW and minA  Skilled Therapeutic Interventions/Progress Updates: Tx 1: Pt received seated in w/c, c/o pain as below and agreeable to treatment. Gait to/from gym RW and S x175'; improving gait speed and foot clearance however maintains forward flexed posture and gait speed less than age/gender norms. Performs ascent/descent of 12 stairs, 3" and 6" stairs with B handrails and S. Requires assist for managing condom catheter bag d/t back precautions. Discussed use of leg bag at home for convenience. OT reported worsening sacral pressure ulcer; discussed importance of pressure relief with pt. Pt given timer, sign in room as reminder and encouraged sit <>stand and standing >2 min every 30 min while sitting in chair for pressure relief. NT and RN alerted to pt cleared to stand without assist, still recommending assist from staff for transfers and in/out of bathroom. Performed couch transfer with S from low seat height. Remained seated in w/c at end of session, all needs in reach.   Tx 2: Pt received seated in w/c, c/o pain as below and agreeable to treatment. Gait to/from gym 2x175' with RW and S. Performed kinetron sitting BLE x10 min for strengthening and endurance. Standing LE exercises including hip flexion marching and heel raises 2x15. Pt with difficulty performing heel raises d/t significant strength deficits in plantarflexors.  Performed seated ankle PF resisted by orange theraband. Pt report losing brief; returned to room with gait as above. Ambulated through day room and stepped on/off scale with S per pt request to weigh himself. Standing balance with S while therapist adjusted brief and TLSO with totalA. Seated LE strengthening exercises 1x15 each with pt using handouts to guide self through HEP as he would at home, min cues for technique. Remained seated in w/c at end of session, all needs in reach.      Therapy Documentation Precautions:  Precautions Precautions: Back, Fall Precaution Booklet Issued: No Precaution Comments: Pt unable to recall; requires cues for functional adherence Required Braces or Orthoses: Spinal Brace Spinal Brace: Thoracolumbosacral orthotic, Applied in sitting position Restrictions Weight Bearing Restrictions: No Pain: Pain Assessment Pain Assessment: 0-10 Pain Score: 7  Pain Type: Acute pain Pain Location: Back Pain Orientation: Lower Pain Descriptors / Indicators: Aching Pain Frequency: Constant Pain Onset: Awakened from sleep Pain Intervention(s): Medication (See eMAR)   See Function Navigator for Current Functional Status.   Therapy/Group: Individual Therapy  Luberta Mutter 06/02/2016, 10:04 AM
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
## 2016-06-02 NOTE — Progress Notes (Signed)
Occupational Therapy Discharge Summary  Patient Details  Name: [name] MRN: [id number] Date of Birth: [birth year]    Patient has met 11 of 11 long term goals due to improved activity tolerance, improved balance, postural control and improved coordination.  Patient to discharge at overall Supervision level.  Patient's care partner is independent to provide the necessary physical assistance at discharge.  Family education completed with pt's sisters including recommendation for supervision level assist. Educated regarding set-up and use of tub transfer bench and TLSO donning/doffing techniques and wear schedule. Pt and caregivers voice feeling comfortable and confident with planned d/c home.  Reasons goals not met: n/a  Recommendation:  Patient will benefit from ongoing skilled OT services in home health setting to continue to advance functional skills in the area of BADL and iADL.  Equipment: tub transfer bench and BSC  Reasons for discharge: treatment goals met and discharge from hospital  Patient/family agrees with progress made and goals achieved: Yes  OT Discharge Precautions/Restrictions  Precautions Precautions: Back;Fall Precaution Comments: Pt able to recall 3/3 precautions, lifting restriction to 5#  Required Braces or Orthoses: Spinal Brace Spinal Brace: Thoracolumbosacral orthotic;Applied in sitting position Restrictions Weight Bearing Restrictions: No ADL ADL ADL Comments: See Functional Assessment Tool Vision/Perception  Vision- History Baseline Vision/History: No visual deficits Vision- Assessment Vision Assessment?: Yes;No apparent visual deficits Additional Comments: Pt reports R eye dizziness/blurriness resolved  Perception Comments: WFL  Cognition Overall Cognitive Status: Within Functional Limits for tasks assessed Arousal/Alertness: Awake/alert Orientation Level: Oriented X4 Safety/Judgment: Appears intact Sensation Sensation Light Touch:  Impaired Detail Light Touch Impaired Details: Impaired RLE;Impaired LLE Stereognosis: Not tested Hot/Cold: Appears Intact Proprioception: Appears Intact Coordination Gross Motor Movements are Fluid and Coordinated: No Fine Motor Movements are Fluid and Coordinated: Yes Coordination and Movement Description: functional in UEs Heel Shin Test: Slow speed, limited ROM Motor  Motor Motor - Discharge Observations: paraparesis BLE Mobility  Bed Mobility Bed Mobility: Supine to Sit;Sit to Supine Supine to Sit: 6: Modified independent (Device/Increase time) Sitting - Scoot to Edge of Bed: 6: Modified independent (Device/Increase time) Sit to Supine: 6: Modified independent (Device/Increase time) Transfers Sit to Stand: 6: Modified independent (Device/Increase time) Stand to Sit: 6: Modified independent (Device/Increase time)  Trunk/Postural Assessment  Cervical Assessment Cervical Assessment: Within Functional Limits Thoracic Assessment Thoracic Assessment: Exceptions to [facility] (limited d/t TLSO) Lumbar Assessment Lumbar Assessment: Exceptions to [facility] (limited d/t TLSO) Postural Control Postural Control: Deficits on evaluation (slow/inefficient stepping strategies)  Balance Balance Balance Assessed: Yes Static Sitting Balance Static Sitting - Balance Support: No upper extremity supported;Feet supported Static Sitting - Level of Assistance: 6: Modified independent (Device/Increase time) Dynamic Sitting Balance Dynamic Sitting - Balance Support: Feet supported;No upper extremity supported Dynamic Sitting - Level of Assistance: 6: Modified independent (Device/Increase time) Static Standing Balance Static Standing - Balance Support: No upper extremity supported;During functional activity Static Standing - Level of Assistance: 5: Stand by assistance Dynamic Standing Balance Dynamic Standing - Balance Support: Bilateral upper extremity supported Dynamic Standing - Level of Assistance: 5:  Stand by assistance Dynamic Standing - Balance Activities: Lateral lean/weight shifting;Forward lean/weight shifting Extremity/Trunk Assessment RUE Assessment RUE Assessment: Within Functional Limits LUE Assessment LUE Assessment: Within Functional Limits   See Function Navigator for Current Functional Status.  [employer] 06/03/2016, 12:17 PM

## 2016-06-02 NOTE — Progress Notes (Signed)
Physical Therapy Note  Patient Details  Name: [name] MRN: [id number] Date of Birth: 11-04-1960 Today's Date: 06/02/2016    Time: 1015-1110 55 minutes  1:1 No c/o pain, pt states back feels "tight", meds given after session.  Gait to/from gym >150' with RW and distant supervision, pt still relies heavily on UEs during gait.  Sit to stand training and standing tolerance without UE support for LE strengthening with supervision, pt states this is easier than when he tried last week.  Nu step x 10 minutes level 5 per pt request for LE/UE strengthening.  Pt requires frequent rests this session due to fatigue from previous 2 hours of therapy.  Pt able to perform all transfers and gait with supervision.   DONAWERTH,KAREN 06/02/2016, 11:10 AM

## 2016-06-02 NOTE — Progress Notes (Signed)
Occupational Therapy Session Note  Patient Details  Name: AKHILESH SPARR MRN: [id number] Date of Birth: 12/03/1960  Today's Date: 06/02/2016 OT Individual Time: 0730-0830 OT Individual Time Calculation (min): 60 min    Short Term Goals: Week 2:  OT Short Term Goal 1 (Week 2): STG=LTG due to LOS  Skilled Therapeutic Interventions/Progress Updates:    Pt seen for OT session focusing on ADL re-training. Pt in supine upon arrival, agreeable to tx session.  With increased time transferred to EOB and donned shoes using AE. Assist provided for donning TLSO. Upon standing, pt's condom cath came off and pt with incontinent urination upon standing. Therefore, pt returned to sitting EOB, new condom cath donned and pants changed, pt using AE to thread LEs into pants and stood at RW to pull pants up. With encouragement for increased independence and education, pt able to don condom cath independently and thread bag through pants.  He ambulated throughout room using RW with supervision. Stood at sink to complete grooming tasks, heavy reliance on UE on sink ledge for balance and energy conservation.  Pt left seated in w/c at end of session, all needs in reach, set0up with breakfast tray with RN present. .   Therapy Documentation Precautions:  Precautions Precautions: Back, Fall Precaution Booklet Issued: No Precaution Comments:  requires cues for functional adherence Required Braces or Orthoses: Spinal Brace Spinal Brace: Thoracolumbosacral orthotic, Applied in sitting position Restrictions Weight Bearing Restrictions: No Pain: Pain Assessment Pain Assessment: 0-10 Pain Score: 7  Pain Type: Acute pain Pain Location: Back Pain Orientation: Lower Pain Descriptors / Indicators: Aching Pain Frequency: Constant Pain Intervention(s): RN aware, repositioned ADL: ADL ADL Comments: See Functional Assessment Tool  See Function Navigator for Current Functional Status.   Therapy/Group:  Individual Therapy  [name], Sativa Gelles C 06/02/2016, 7:04 AM

## 2016-06-03 ENCOUNTER — Inpatient Hospital Stay (HOSPITAL_COMMUNITY): Payer: Medicaid Other | Admitting: Physical Therapy

## 2016-06-03 ENCOUNTER — Inpatient Hospital Stay (HOSPITAL_COMMUNITY): Payer: Medicaid Other | Admitting: Occupational Therapy

## 2016-06-03 ENCOUNTER — Ambulatory Visit: Payer: Medicaid Other

## 2016-06-03 NOTE — Progress Notes (Signed)
Occupational Therapy Session Note  Patient Details  Name: [name] MRN: [id number] Date of Birth: 1961/04/22  Today's Date: 06/03/2016 OT Individual Time: 1110-1200 OT Individual Time Calculation (min): 50 min    Short Term Goals: Week 1:  OT Short Term Goal 1 (Week 1): Complete bathing at shower level with mod assist using AE/DME, PRN OT Short Term Goal 1 - Progress (Week 1): Met OT Short Term Goal 2 (Week 1): Complete dressing using AE with mod assist  OT Short Term Goal 2 - Progress (Week 1): Met OT Short Term Goal 3 (Week 1): Don TLSO sitting at EOB with mod assist OT Short Term Goal 3 - Progress (Week 1): Met OT Short Term Goal 4 (Week 1): Perform BADL with min vc to adhere to back precautions OT Short Term Goal 4 - Progress (Week 1): Met Week 2:  OT Short Term Goal 1 (Week 2): STG=LTG due to LOS      Skilled Therapeutic Interventions/Progress Updates:    Pt seen this session to review safe transfers with RW of ambulating in and out of bathroom to the toilet with mod I and S with toileting due to limitations from the brace. Pt ambulated to gym and completed an obstacle course 2x stepping over obstacles, weaving through cones, up/down steps and ramp with S.  Provided pt with small bag to attach to lower bar of RW to allow him to place his urinal bag in. Reviewed his discharge plan and who will be assisting him at home. Pt is ready for discharge tomorrow.  Therapy Documentation Precautions:  Precautions Precautions: Back, Fall Precaution Booklet Issued: No Precaution Comments: Pt able to recall 3/3 precautions, lifting restriction to 5#  Required Braces or Orthoses: Spinal Brace Spinal Brace: Thoracolumbosacral orthotic, Applied in sitting position Restrictions Weight Bearing Restrictions: No    Pain: Pain Assessment Pain Assessment: 0-10 Pain Score: 8  Pain Type: Acute pain Pain Location: Back Pain Orientation: Lower Pain Descriptors / Indicators: Aching Pain  Frequency: Constant Pain Onset: On-going Patients Stated Pain Goal: 6 Pain Intervention(s): Other (Comment) (pre-medicated) Multiple Pain Sites: No ADL: ADL ADL Comments: See Functional Assessment Tool  See Function Navigator for Current Functional Status.   Therapy/Group: Individual Therapy  [place] 06/03/2016, 9:33 AM

## 2016-06-03 NOTE — Progress Notes (Signed)
Physical Therapy Discharge Summary  Patient Details  Name: [name] MRN: [id number] Date of Birth: [birth year]  Today's Date: 06/03/2016 PT Individual Time: 0800-0900 and 1300-1345 PT Individual Time Calculation (min): 60 min and 45 min (total 105 min)   Patient has met 11 of 11 long term goals due to improved activity tolerance, improved balance, improved postural control, increased strength, ability to compensate for deficits, functional use of  right lower extremity and left lower extremity and improved awareness.  Patient to discharge at an ambulatory level Supervision.   Patient's care partner is independent to provide the necessary physical assistance at discharge.  Reasons goals not met: All goals met  Recommendation:  Patient will benefit from ongoing skilled PT services in home health setting to continue to advance safe functional mobility, address ongoing impairments in strength, coordination, balance, activity tolerance, and minimize fall risk.  Equipment: W/c  Reasons for discharge: treatment goals met and discharge from hospital  Patient/family agrees with progress made and goals achieved: Yes  PT Discharge Precautions/Restrictions Precautions Precautions: Back;Fall Precaution Comments: Pt able to recall 3/3 precautions, lifting restriction to 5#  Required Braces or Orthoses: Spinal Brace Spinal Brace: Thoracolumbosacral orthotic;Applied in sitting position Restrictions Weight Bearing Restrictions: No Pain Pain Assessment Pain Assessment: 0-10 Pain Score: 8  Pain Type: Acute pain Pain Location: Back Pain Orientation: Lower Pain Descriptors / Indicators: Aching Pain Frequency: Constant Pain Onset: On-going Patients Stated Pain Goal: 6 Pain Intervention(s): Other (Comment) (pre-medicated) Multiple Pain Sites: No Vision/Perception  Vision - Assessment Additional Comments: Pt reports R eye dizziness/blurriness resolved  Perception Comments: WFL   Cognition Overall Cognitive Status: Within Functional Limits for tasks assessed Arousal/Alertness: Awake/alert Orientation Level: Oriented X4 Safety/Judgment: Appears intact Sensation Sensation Light Touch: Impaired Detail Light Touch Impaired Details: Impaired RLE;Impaired LLE Stereognosis: Not tested Hot/Cold: Not tested Coordination Gross Motor Movements are Fluid and Coordinated: No Heel Shin Test: Slow speed, limited ROM Motor  Motor Motor - Discharge Observations: paraparesis BLE  Mobility Bed Mobility Bed Mobility: Supine to Sit;Sit to Supine Supine to Sit: 6: Modified independent (Device/Increase time) Sitting - Scoot to Edge of Bed: 6: Modified independent (Device/Increase time) Sit to Supine: 6: Modified independent (Device/Increase time) Transfers Transfers: Yes Sit to Stand: 6: Modified independent (Device/Increase time) Stand to Sit: 6: Modified independent (Device/Increase time) Stand Pivot Transfers: 6: Modified independent (Device/Increase time) Locomotion  Ambulation Ambulation: Yes Ambulation/Gait Assistance: 5: Supervision Ambulation Distance (Feet): 175 Feet Assistive device: Rolling walker Ambulation/Gait Assistance Details: Verbal cues for precautions/safety;Verbal cues for technique Ambulation/Gait Assistance Details: cues for upright posture Gait Gait: Yes Gait Pattern: Impaired Gait Pattern: Decreased stride length;Poor foot clearance - right;Poor foot clearance - left;Right flexed knee in stance;Left flexed knee in stance Gait velocity: 1.42 ft/sec Stairs / Additional Locomotion Stairs: Yes Stairs Assistance: 5: Supervision Stair Management Technique: Two rails;With walker;Forwards Number of Stairs: 12 Height of Stairs: 6 Ramp: 5: Supervision Curb: 5: Psychiatric nurse: Yes Wheelchair Assistance: 6: Modified independent (Device/Increase time) Environmental health practitioner: Both upper extremities Wheelchair  Parts Management: Supervision/cueing Distance: >150'  Trunk/Postural Assessment  Cervical Assessment Cervical Assessment: Within Functional Limits Thoracic Assessment Thoracic Assessment: Exceptions to [facility] (limited d/t TLSO) Lumbar Assessment Lumbar Assessment: Exceptions to WFL (limited d/t TLSO) Postural Control Postural Control: Deficits on evaluation (slow/inefficient stepping strategies)  Balance Balance Balance Assessed: Yes Static Sitting Balance Static Sitting - Balance Support: No upper extremity supported;Feet supported Static Sitting - Level of Assistance: 6: Modified independent (Device/Increase time) Dynamic Sitting Balance Dynamic  Sitting - Balance Support: Feet supported;No upper extremity supported Dynamic Sitting - Level of Assistance: 6: Modified independent (Device/Increase time) Static Standing Balance Static Standing - Balance Support: No upper extremity supported;During functional activity Static Standing - Level of Assistance: 5: Stand by assistance Dynamic Standing Balance Dynamic Standing - Balance Support: Bilateral upper extremity supported Dynamic Standing - Level of Assistance: 5: Stand by assistance Dynamic Standing - Balance Activities: Lateral lean/weight shifting;Forward lean/weight shifting Extremity Assessment  RUE Assessment RUE Assessment: Within Functional Limits LUE Assessment LUE Assessment: Within Functional Limits RLE Assessment RLE Assessment: Exceptions to [facility] (4-/5 throughout) LLE Assessment LLE Assessment: Exceptions to [facility] (4-/5 throughout)   Skilled Therapeutic Intervention: Tx 1: Pt received supine in bed, c/o pain as above 8/10 in back and agreeable to treatment. Pt performs bed mobility modI maintaining back precautions Pt empties catheter, dons pants, shoes and TLSO with setupA; totalA for donning TEDs. ModI sit <>stand for balance in standing to pull up pants and stand at sink to perform oral hygiene. Gait to gym with S and  RW. Performed car transfer, gait on ramp, uneven surface, curb step with RW and S. Returned to room; remained seated on toilet at end of session with pt verbalizing understanding to use call bell when finished.  Tx 2: Pt received seated in w/c, c/o pain in back as above and agreeable to treatment. Gait to laundry room with RW and S. Pt places clothes in machine and turns on with S. Performs ascent/descent 12 stairs, 3" and 6" height, with B handrails and S. Performed nustep x10 min with BUE/BLE on level 4 for strengthening and aerobic endurance. Gait to return to room with S. Sit >supine with modI. Remained supine in bed at end of session, all needs in reach. Pt with no further questions/concerns regarding d/c home at this time.    See Function Navigator for Current Functional Status.  Benjiman Core Tygielski 06/03/2016, 8:57 AM

## 2016-06-03 NOTE — Progress Notes (Signed)
[place] PHYSICAL MEDICINE & REHABILITATION     PROGRESS NOTE    Subjective/Complaints: Did fairly well with decreased oxycontin last night. Pleased with progress overall.    Review of Systems  Cardiovascular: Negative for palpitations.  Gastrointestinal: Negative for abdominal pain.  Genitourinary: Positive for frequency.  Musculoskeletal: Positive for joint pain and myalgias.  Neurological: Positive for sensory change.        Objective: Vital Signs: Blood pressure 126/60, pulse 62, temperature 98.4 F (36.9 C), temperature source Oral, resp. rate 18, height 5\' 10"  (1.778 m), weight 92.3 kg (203 lb 8 oz), SpO2 98 %. No results found.  Recent Labs  06/02/16 0638  WBC 6.5  HGB 9.4*  HCT 28.9*  PLT 223    Recent Labs  06/02/16 0638  NA 130*  K 3.7  CL 98*  GLUCOSE 107*  BUN 14  CREATININE 0.89  CALCIUM 8.8*   CBG (last 3)  No results for input(s): GLUCAP in the last 72 hours.  Wt Readings from Last 3 Encounters:  05/28/16 92.3 kg (203 lb 8 oz)  05/10/16 100.6 kg (221 lb 11.2 oz)  05/08/16 96.6 kg (213 lb)    Physical Exam:  Constitutional: He is oriented to person, place, and time. He appears well-developedand well-nourished.    HENT:  Head: Normocephalicand atraumatic.  Eyes: conjunctivae clear.   Neck: Normal range of motion. Neck supple. No tracheal deviationpresent. No thyromegalypresent.  Cardiovascular: RRR Respiratory:  CTA B.  GI: Soft. Bowel sounds are normal. He exhibits no distension. There is no tenderness.  Musculoskeletal: tr edema bilateral LE's.  Neurological: He is alertand oriented to person, place, and time.  UE motor nearly 5/5 with ongoing pain inhibition due to back pain. LE: 3/5 HF, 3+/5 KE and 3 to 3+/5 bilateral ADF/PF with an element of pain inhibition also. Remains slightly Decreased LT below chest although still can sense pain.    Skin: wound on scalp healed.  Abrasions on buttock,legs/arms all healing.   Psych:pleasant  Assessment/Plan: 1. Weakness, functional deficits secondary to T12 burst fracture/polytrauma with paraplegia which require 3+ hours per day of interdisciplinary therapy in a comprehensive inpatient rehab setting. Physiatrist is providing close team supervision and 24 hour management of active medical problems listed below. Physiatrist and rehab team continue to assess barriers to discharge/monitor patient progress toward functional and medical goals.  Function:  Bathing Bathing position   Position: Shower  Bathing parts Body parts bathed by patient: Right arm, Left arm, Chest, Abdomen, Front perineal area, Right upper leg, Left upper leg, Back, Right lower leg, Left lower leg Body parts bathed by helper: Buttocks  Bathing assist Assist Level: Touching or steadying assistance(Pt > 75%)   Set up : To obtain items  Upper Body Dressing/Undressing Upper body dressing   What is the patient wearing?: Pull over shirt/dress, Orthosis     Pull over shirt/dress - Perfomed by patient: Thread/unthread right sleeve, Thread/unthread left sleeve, Put head through opening, Pull shirt over trunk Pull over shirt/dress - Perfomed by helper: Pull shirt over trunk     Orthosis activity level: Performed by helper  Upper body assist Assist Level: Set up   Set up : To apply TLSO, cervical collar, To obtain clothing/put away  Lower Body Dressing/Undressing Lower body dressing   What is the patient wearing?: Pants, Ted Hose, Shoes     Pants- Performed by patient: Thread/unthread right pants leg, Thread/unthread left pants leg, Pull pants up/down Pants- Performed by helper: Pull pants up/down, Thread/unthread  right pants leg, Thread/unthread left pants leg Non-skid slipper socks- Performed by patient: Don/doff right sock, Don/doff left sock Non-skid slipper socks- Performed by helper: Don/doff right sock, Don/doff left sock   Socks - Performed by helper: Don/doff right sock Shoes -  Performed by patient: Don/doff right shoe, Don/doff left shoe Shoes - Performed by helper: Fasten right, Fasten left       TED Hose - Performed by helper: Don/doff right TED hose, Don/doff left TED hose  Lower body assist Assist for lower body dressing: Supervision or verbal cues Assistive Device Comment: Reacher LH shoe horn Set up : Don/doff TED stockings, To obtain clothing/put away  Toileting Toileting   Toileting steps completed by patient: Adjust clothing prior to toileting, Adjust clothing after toileting Toileting steps completed by helper: Performs perineal hygiene Toileting Assistive Devices: Grab bar or rail  Toileting assist Assist level:  (min/mod assist depending on pain)   Transfers Chair/bed transfer   Chair/bed transfer method: Stand pivot Chair/bed transfer assist level: Supervision or verbal cues Chair/bed transfer assistive device: Medical sales representative     Max distance: 175 Assist level: Supervision or verbal cues   Wheelchair   Type: Manual Max wheelchair distance: > 150 ft Assist Level: No help, No cues, assistive device, takes more than reasonable amount of time  Cognition Comprehension Comprehension assist level: Follows complex conversation/direction with extra time/assistive device  Expression Expression assist level: Expresses complex ideas: With extra time/assistive device  Social Interaction Social Interaction assist level: Interacts appropriately with others with medication or extra time (anti-anxiety, antidepressant).  Problem Solving Problem solving assist level: Solves basic problems with no assist  Memory Memory assist level: Recognizes or recalls 90% of the time/requires cueing < 10% of the time   Medical Problem List and Plan: 1. Functional and mobility deficitssecondary to multiple spinal fractures including T12 Burst fracture with incomplete paraplegia -continue CIR therapies. PT, OT   - finalizing dc  planning. Team conference today 2. DVT Prophylaxis/Anticoagulation: Mechanical: Sequential compression devices, below knee Bilateral lower extremities  -continue sq lovenox 40 daily  -can dc tomorrow 3. Pain Management: Continue Oxy IR with scheduled robaxin and ultram.  -reduced oxycontin to 20mg  q12  - Decreased ultram due to liver disease.   -continue Local measures with heat/ice also---I expressed to him that he needs to use alternative means for pain control  -lyrica for neuropathy  .  4. Mood: LCSW to follow for evaluation and support.  5. Neuropsych: This patient iscapable of making decisions on hisown behalf. 6. Skin/Wound Care:    -appreciate surgery follow up. Staples out  -antibiotic ointment     drain 7. Fluids/Electrolytes/Nutrition: Monitor I/O.   -sodium up to 130 yesterday   -  Lasix held  -encourage PO    8. Prostate cancer: Was undergoing weekly XRT PTA. (THESE ARE NOW ON HOLD) 9. Bladder neck contracture with retention: voiding trial./condom continues to keep dry  - -pt feels patterns are improving 10. Mild corneal abrasions OD: Poor vision right eye. Showing some improvement 11. ABLA: hgb up to 9.4 2/5 12. Cirrhosis of liver: Thrombocytopenia resolving. Reports that he was onxifamin and lactulose but was not taking as prescribed--managed by Dr. Watt Climes. Requested to have family bring in meds.   -ammonia level 34 recently 1. Bilateral lung contusions with effusions:continue IS to help with SOB/mobilize secretions.  14. Hematuria/bladder: improved 15. History of hemorrhoids, was scheduled to see gastroenterology for treatment.  -anusol  -  LOS (Days) 18 A [place]  Meredith Staggers, MD 06/03/2016 8:42 AM

## 2016-06-03 NOTE — Progress Notes (Signed)
Occupational Therapy Session Note  Patient Details  Name: AMILLIO MULCAHY MRN: [id number] Date of Birth: 1961-04-24  Today's Date: 06/03/2016 OT Individual Time: [id number] OT Individual Time Calculation (min): 67 min    Skilled Therapeutic Interventions/Progress Updates:  Patient participated in bathing and dressing therapy as follows:  He was sitting upright in his w/c.    W/c to/fr cutout tub transfer bench via standard walker= supervision               UB bathing=setup via long handled sponge   UB dressing = setup (to tighten TLSO)               LB bathing= supervision with lateral leans and utilizing cutout tub transfer bench           LB dressing required assist to don tight TED hose and utilized reacher to don over feet and lateral leans to pull over hips and extra time in order to adhere to BAT back precautions  This clinician helped to tighten his TLSO after he became fatigued at the end of the session  Patient left seated in his w/c with call bell in place      Therapy Documentation Precautions:  Precautions Precautions: Back, Fall Precaution Booklet Issued: No Precaution Comments: Pt able to recall 3/3 precautions, lifting restriction to 5#  Required Braces or Orthoses: Spinal Brace Spinal Brace: Thoracolumbosacral orthotic, Applied in sitting position Restrictions Weight Bearing Restrictions: No   Pain:did not rate.   Contacted RN for pain meds  Therapy/Group: Individual Therapy  Alfredia Ferguson [facility] 06/03/2016, 11:20 AM

## 2016-06-04 ENCOUNTER — Ambulatory Visit: Payer: Medicaid Other

## 2016-06-04 MED ORDER — TRAMADOL HCL 50 MG PO TABS: 25.0000 mg | ORAL_TABLET | Freq: Four times a day (QID) | ORAL | 0 refills | Status: DC

## 2016-06-04 MED ORDER — ADULT MULTIVITAMIN W/MINERALS CH: 1.0000 | ORAL_TABLET | Freq: Every day | ORAL | 0 refills | Status: DC

## 2016-06-04 MED ORDER — THIAMINE HCL 100 MG PO TABS: 100.0000 mg | ORAL_TABLET | Freq: Every day | ORAL | 0 refills | Status: DC

## 2016-06-04 MED ORDER — GIZMO CONDOM CATHETER MISC: 1.0000 "application " | Freq: Every day | 0 refills | Status: DC

## 2016-06-04 MED ORDER — OXYCODONE HCL ER 20 MG PO T12A: 20.0000 mg | EXTENDED_RELEASE_TABLET | Freq: Two times a day (BID) | ORAL | 0 refills | Status: DC

## 2016-06-04 MED ORDER — METHOCARBAMOL 750 MG PO TABS: 750.0000 mg | ORAL_TABLET | Freq: Four times a day (QID) | ORAL | 0 refills | Status: DC | PRN

## 2016-06-04 MED ORDER — LINACLOTIDE 290 MCG PO CAPS: 290.0000 ug | ORAL_CAPSULE | Freq: Every day | ORAL | 0 refills | Status: DC

## 2016-06-04 MED ORDER — ALPRAZOLAM 0.5 MG PO TABS: 0.5000 mg | ORAL_TABLET | Freq: Every evening | ORAL | 0 refills | Status: DC | PRN

## 2016-06-04 MED ORDER — OXYCODONE HCL 10 MG PO TABS: 5.0000 mg | ORAL_TABLET | ORAL | 0 refills | Status: DC | PRN

## 2016-06-04 MED ORDER — POTASSIUM CHLORIDE ER 10 MEQ PO TBCR: 10.0000 meq | EXTENDED_RELEASE_TABLET | Freq: Every day | ORAL | 0 refills | Status: DC

## 2016-06-04 MED ORDER — FOLIC ACID 1 MG PO TABS: 1.0000 mg | ORAL_TABLET | Freq: Every day | ORAL | 0 refills | Status: DC

## 2016-06-04 MED ORDER — SPIRONOLACTONE 50 MG PO TABS: 50.0000 mg | ORAL_TABLET | Freq: Two times a day (BID) | ORAL | 0 refills | Status: DC

## 2016-06-04 MED ORDER — NICOTINE 14 MG/24HR TD PT24: 14.0000 mg | MEDICATED_PATCH | Freq: Every day | TRANSDERMAL | 0 refills | Status: DC

## 2016-06-04 MED ORDER — FUROSEMIDE 40 MG PO TABS: 20.0000 mg | ORAL_TABLET | Freq: Every day | ORAL | 0 refills | Status: DC

## 2016-06-04 MED FILL — FOLIC ACID 1 MG TABLET: 1 | 30 days supply | Qty: 30 | Fill #0

## 2016-06-04 MED FILL — FUROSEMIDE 40 MG TABLET: 40 | 30 days supply | Qty: 15 | Fill #0

## 2016-06-04 MED FILL — METHOCARBAMOL 750 MG TABLET: 750 | 25 days supply | Qty: 200 | Fill #0

## 2016-06-04 MED FILL — traMADol HCL 50 MG TABS: 50 | 30 days supply | Qty: 60 | Fill #0

## 2016-06-04 MED FILL — POTASSIUM CL ER 10 MEQ TAB: 10 | 30 days supply | Qty: 30 | Fill #0

## 2016-06-04 MED FILL — SPIRONOLACTONE 50 MG TABLET: 50 | 30 days supply | Qty: 60 | Fill #0

## 2016-06-04 MED FILL — OxyCONTIN 20 MG T12A: 20 | 30 days supply | Qty: 60 | Fill #0

## 2016-06-04 NOTE — Progress Notes (Addendum)
[place] PHYSICAL MEDICINE & REHABILITATION     PROGRESS NOTE    Subjective/Complaints: No new issues overnight. Appreciative of efforts of rehab team   Review of Systems  Cardiovascular: Negative for palpitations.  Gastrointestinal: Negative for abdominal pain.  Genitourinary: Positive for frequency.  Musculoskeletal: Positive for joint pain and myalgias.  Neurological: Positive for sensory change.        Objective: Vital Signs: Blood pressure 124/63, pulse 61, temperature 98 F (36.7 C), temperature source Oral, resp. rate 18, height 5\' 10"  (1.778 m), weight 92.3 kg (203 lb 8 oz), SpO2 100 %. No results found.  Recent Labs  06/02/16 0638  WBC 6.5  HGB 9.4*  HCT 28.9*  PLT 223    Recent Labs  06/02/16 0638  NA 130*  K 3.7  CL 98*  GLUCOSE 107*  BUN 14  CREATININE 0.89  CALCIUM 8.8*   CBG (last 3)  No results for input(s): GLUCAP in the last 72 hours.  Wt Readings from Last 3 Encounters:  05/28/16 92.3 kg (203 lb 8 oz)  05/10/16 100.6 kg (221 lb 11.2 oz)  05/08/16 96.6 kg (213 lb)    Physical Exam:  Constitutional: He is oriented to person, place, and time. He appears well-developedand well-nourished.    HENT:  Head: Normocephalicand atraumatic.  Eyes: conjunctivae clear.   Neck: Normal range of motion. Neck supple. No tracheal deviationpresent. No thyromegalypresent.  Cardiovascular:RRR Respiratory:  Clear.  GI: Soft. Bowel sounds are normal. He exhibits no distension. There is no tenderness.  Musculoskeletal: tr edema bilateral LE's.  Neurological: He is alertand oriented to person, place, and time.  UE motor nearly 5/5 with ongoing pain inhibition due to back pain. LE: 3/5 HF, 3+/5 KE and 3 to 3+/5 bilateral ADF/PF with an element of pain inhibition also. Remains slightly Decreased LT below chest although still can sense pain.    Skin: wound on scalp healed.  Abrasions on buttock,legs/arms all healing. Really just scab on right  buttocks.  Psych:pleasant  Assessment/Plan: 1. Weakness, functional deficits secondary to T12 burst fracture/polytrauma with paraplegia which require 3+ hours per day of interdisciplinary therapy in a comprehensive inpatient rehab setting. Physiatrist is providing close team supervision and 24 hour management of active medical problems listed below. Physiatrist and rehab team continue to assess barriers to discharge/monitor patient progress toward functional and medical goals.  Function:  Bathing Bathing position   Position: Shower  Bathing parts Body parts bathed by patient: Right arm, Right lower leg, Left arm, Left lower leg, Chest, Abdomen, Back, Front perineal area, Buttocks, Right upper leg, Left upper leg Body parts bathed by helper: Buttocks  Bathing assist Assist Level: Supervision or verbal cues   Set up : To obtain items  Upper Body Dressing/Undressing Upper body dressing   What is the patient wearing?: Pull over shirt/dress, Orthosis     Pull over shirt/dress - Perfomed by patient: Thread/unthread right sleeve, Thread/unthread left sleeve, Put head through opening, Pull shirt over trunk Pull over shirt/dress - Perfomed by helper: Pull shirt over trunk     Orthosis activity level: Performed by patient (this clincian helped tighten once he donned)  Upper body assist Assist Level: Supervision or verbal cues   Set up : To apply TLSO, cervical collar, To obtain clothing/put away  Lower Body Dressing/Undressing Lower body dressing   What is the patient wearing?: Pants     Pants- Performed by patient: Thread/unthread right pants leg, Thread/unthread left pants leg, Pull pants up/down, Fasten/unfasten pants  Pants- Performed by helper: Pull pants up/down, Thread/unthread right pants leg, Thread/unthread left pants leg Non-skid slipper socks- Performed by patient: Don/doff right sock, Don/doff left sock Non-skid slipper socks- Performed by helper: Don/doff right sock,  Don/doff left sock   Socks - Performed by helper: Don/doff right sock Shoes - Performed by patient: Don/doff right shoe, Don/doff left shoe Shoes - Performed by helper: Fasten right, Fasten left       TED Hose - Performed by helper: Don/doff right TED hose, Don/doff left TED hose  Lower body assist Assist for lower body dressing: Supervision or verbal cues Assistive Device Comment:  (reacher utilized today -wore no shoes) Set up : Don/doff TED stockings, To obtain clothing/put away  [employer] activity did not occur: N/A (did not voice need to toilet during session; wears foley) Toileting steps completed by patient: Adjust clothing prior to toileting, Adjust clothing after toileting, Performs perineal hygiene Toileting steps completed by helper: Adjust clothing prior to toileting Toileting Assistive Devices: Grab bar or rail  Toileting assist Assist level: Supervision or verbal cues   Transfers Chair/bed transfer   Chair/bed transfer method: Stand pivot Chair/bed transfer assist level: No Help, no cues, assistive device, takes more than a reasonable amount of time Chair/bed transfer assistive device: Medical sales representative     Max distance: 200 Assist level: Supervision or verbal cues   Wheelchair   Type: Manual Max wheelchair distance: > 150 ft Assist Level: No help, No cues, assistive device, takes more than reasonable amount of time  Cognition Comprehension Comprehension assist level: Follows basic conversation/direction with extra time/assistive device  Expression Expression assist level: Expresses complex ideas: With extra time/assistive device  Social Interaction Social Interaction assist level: Interacts appropriately with others with medication or extra time (anti-anxiety, antidepressant).  Problem Solving Problem solving assist level: Solves basic problems with no assist  Memory Memory assist level: Recognizes or recalls 90% of the  time/requires cueing < 10% of the time   Medical Problem List and Plan: 1. Functional and mobility deficitssecondary to multiple spinal fractures including T12 Burst fracture with incomplete paraplegia     -dc home today  Patient to see me in the office for transitional care encounter in 1-2 weeks.  -needs surgical, onc, urology follow up as outpt 2. DVT Prophylaxis/Anticoagulation: Mechanical: Sequential compression devices, below knee Bilateral lower extremities  -dc lovenox 3. Pain Management: Continue Oxy IR with scheduled robaxin and ultram.  -reduced oxycontin to 20mg  q12  - Decreased ultram due to liver disease.   -continue Local measures with heat/ice also---I expressed to him that he needs to use alternative means for pain control  -lyrica for neuropathy  .  4. Mood: LCSW to follow for evaluation and support.  5. Neuropsych: This patient iscapable of making decisions on hisown behalf. 6. Skin/Wound Care:    -appreciate surgery follow up. Staples out  -foam dressing/open to air for all wounds 7. Fluids/Electrolytes/Nutrition: Monitor I/O.   -sodium up to 130    -  Lasix held  -encourage PO    8. Prostate cancer: Was undergoing weekly XRT PTA. (THESE ARE NOW ON HOLD) 9. Bladder neck contracture with retention: voiding trial./condom continues to keep dry  - -some improvement. Needs uro follow up 10. Mild corneal abrasions OD: Poor vision right eye. Showing some improvement 11. ABLA: hgb up to 9.4 2/5 12. Cirrhosis of liver: Thrombocytopenia resolving. Reports that he was onxifamin and lactulose but was not taking as prescribed--managed by  Dr. Watt Climes. Requested to have family bring in meds.   -ammonia level 34 recently 78. Bilateral lung contusions with effusions:continue IS to help with SOB/mobilize secretions.  14. Hematuria/bladder: improved 15. History of hemorrhoids, was scheduled to see gastroenterology for treatment.  -anusol  -    LOS  (Days) [place] T, MD 06/04/2016 8:53 AM

## 2016-06-04 NOTE — Discharge Instructions (Signed)
Inpatient Rehab Discharge Instructions  [name] Discharge date and time:  06/04/16  Activities/Precautions/ Functional Status: Activity: no lifting, driving, or strenuous exercise for till cleared by MD Diet: regular diet Wound Care: keep wound clean and dry   Functional status:  ___ No restrictions     ___ Walk up steps independently _X__ 24/7 supervision/assistance   ___ Walk up steps with assistance ___ Intermittent supervision/assistance  ___ Bathe/dress independently ___ Walk with walker     __X_ Bathe/dress with assistance ___ Walk Independently    ___ Shower independently ___ Walk with assistance    ___ Shower with assistance ___ No alcohol     ___ Return to work/school ________  COMMUNITY REFERRALS UPON DISCHARGE:   Home Health:   PT     OT     RN     SNA    Agency:  [facility] Phone:  [phone] Medical Equipment/Items Ordered:  18"x18" lightweight wheelchair with basic cushion; tub transfer bench; rolling walker; 3-in-1   Agency/Supplier:  [facility]          Phone:  [phone]  Special Instructions: 1. Wear brace at edge of bed and when out of bed. 2. Follow up with Dr. Oletta Lamas regarding your medications.    My questions have been answered and I understand these instructions. I will adhere to these goals and the provided educational materials after my discharge from the hospital.  Patient/Caregiver Signature _______________________________ Date __________  Clinician Signature _______________________________________ Date __________  Please bring this form and your medication list with you to all your follow-up doctor's appointments.
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
## 2016-06-04 NOTE — Progress Notes (Signed)
Social Work Discharge Note  The overall goal for the admission was met for:   Discharge location: Yes - home with family  Length of Stay: Yes - 19 days  Discharge activity level: Yes - supervision  Home/community participation: Yes  Services provided included: MD, RD, PT, OT, RN, Pharmacy, Neuropsych and SW  Financial Services: Medicaid  Follow-up services arranged: Home Health: PT/OT/RN/CNA, DME: [phone]" lightweight w/c with basic cushion; 3-in-1; rolling walker; tub transfer bench and Patient/Family request agency HH: [facility], DME: [facility]  Comments (or additional information): Pt to return to his sister and brother-in-law's home where pt lived PTA.  Sister [name]) will provide supervision and sister Lenna Sciara) will help with transportation to doctors' appointments.  Pt made good progress.  Pain limits him some.  Rehab to continue with [place] at home.  Patient/Family verbalized understanding of follow-up arrangements: Yes  Individual responsible for coordination of the follow-up plan: pt with assistance from his sisters  Confirmed correct DME delivered: Trey Sailors 06/04/2016    Jakhi Dishman, Silvestre Mesi

## 2016-06-04 NOTE — Progress Notes (Signed)
[name] controlled substance reporting resistery was reviewed. Patient has been getting oxycodone monthly last by Dr. Tammi Klippel for generalized pain due to XRT.  Pain should improve with recovery from trauma and surgery. He was educated on need to start tapering narcotics further. He needs to decide which physician will be managing his narcotics.

## 2016-06-04 NOTE — Progress Notes (Signed)
Pt. Got d/c papers,follow up appointments.Condom cath's and small collection bag was given to pt. As well.

## 2016-06-04 NOTE — Progress Notes (Signed)
Social Work Patient ID: [name], male   DOB: November 18, 1960, 56 y.o.   MRN: [id number]   CSW met spoke with pt and his sister, Lenna Sciara, 06-03-16 to update them on team conference discussion.  Team continues to feel pt is ready to go home.  Arrangements for [facility] and DME have been made.  Pt does not have any concerns/questions/needs at this time and is eager to get home.  CSW remains available as needed.

## 2016-06-05 ENCOUNTER — Ambulatory Visit: Payer: Medicaid Other

## 2016-06-06 ENCOUNTER — Telehealth: Payer: Self-pay | Admitting: *Deleted

## 2016-06-06 ENCOUNTER — Ambulatory Visit: Payer: Medicaid Other

## 2016-06-06 NOTE — Telephone Encounter (Signed)
Transitional care call completed, appointment confirmed, address confirmed, new patient packet mailed  Transitional Care Questions  Questions for our staff to ask patients on Transitional care 48 hour phone call:   1. Are you/is patient experiencing any problems since coming home? No  Are there any questions regarding any aspect of care? No  2. Are there any questions regarding medications administration/dosing? No  Are meds being taken as prescribed? Yes Patient should review meds with caller to confirm   3. Have there been any falls? No  4. Has Home Health been to the house and/or have they contacted you? Yes, homehealth nurse came by today. If not, have you tried to contact them? Can we help you contact them? No   5. Are bowels and bladder emptying properly? atient is using condom catheters  Are there any unexpected incontinence issues?  No If applicable, is patient following bowel/bladder programs? Yes  6. Any fevers, problems with breathing, unexpected pain? No   7. Are there any skin problems or new areas of breakdown? Wound on the back  8. Has the patient/family member arranged specialty MD follow up (ie cardiology/neurology/renal/surgical/etc)?  still working on that, I gave the sister a phone number for Dr. Annette Stable to follow up post-operatively Can we help arrange? No   9. Does the patient need any other services or support that we can help arrange? Not at the moment  10. Are caregivers following through as expected in assisting the patient? Yes   11. Has the patient quit smoking, drinking alcohol, or using drugs as recommended? Patient is not drinking or using illicit drugs, still smoking

## 2016-06-09 ENCOUNTER — Ambulatory Visit: Payer: Medicaid Other

## 2016-06-10 ENCOUNTER — Ambulatory Visit: Payer: Medicaid Other

## 2016-06-11 ENCOUNTER — Ambulatory Visit: Payer: Medicaid Other

## 2016-06-12 ENCOUNTER — Ambulatory Visit: Payer: Medicaid Other

## 2016-06-13 ENCOUNTER — Ambulatory Visit: Payer: Medicaid Other

## 2016-06-16 ENCOUNTER — Ambulatory Visit: Payer: Medicaid Other

## 2016-06-17 ENCOUNTER — Ambulatory Visit: Payer: Medicaid Other

## 2016-06-18 ENCOUNTER — Encounter: Payer: No Typology Code available for payment source | Admitting: Physical Medicine & Rehabilitation

## 2016-06-18 ENCOUNTER — Ambulatory Visit: Payer: Medicaid Other

## 2016-06-19 ENCOUNTER — Ambulatory Visit: Payer: Medicaid Other

## 2016-06-20 ENCOUNTER — Ambulatory Visit: Payer: Medicaid Other

## 2016-06-20 ENCOUNTER — Encounter: Payer: Medicaid Other | Attending: Physical Medicine & Rehabilitation | Admitting: Physical Medicine & Rehabilitation

## 2016-06-20 ENCOUNTER — Encounter: Payer: Self-pay | Admitting: Physical Medicine & Rehabilitation

## 2016-06-20 VITALS — BP 157/88 | HR 80 | Resp 14

## 2016-06-20 DIAGNOSIS — S0501XA Injury of conjunctiva and corneal abrasion without foreign body, right eye, initial encounter: Secondary | ICD-10-CM | POA: Diagnosis not present

## 2016-06-20 DIAGNOSIS — X58XXXA Exposure to other specified factors, initial encounter: Secondary | ICD-10-CM | POA: Diagnosis not present

## 2016-06-20 DIAGNOSIS — M51379 Other intervertebral disc degeneration, lumbosacral region without mention of lumbar back pain or lower extremity pain: Secondary | ICD-10-CM

## 2016-06-20 DIAGNOSIS — S0501XS Injury of conjunctiva and corneal abrasion without foreign body, right eye, sequela: Secondary | ICD-10-CM | POA: Diagnosis not present

## 2016-06-20 DIAGNOSIS — K769 Liver disease, unspecified: Secondary | ICD-10-CM | POA: Insufficient documentation

## 2016-06-20 DIAGNOSIS — S22082A Unstable burst fracture of T11-T12 vertebra, initial encounter for closed fracture: Secondary | ICD-10-CM | POA: Diagnosis not present

## 2016-06-20 DIAGNOSIS — M5137 Other intervertebral disc degeneration, lumbosacral region: Secondary | ICD-10-CM

## 2016-06-20 DIAGNOSIS — S22081A Stable burst fracture of T11-T12 vertebra, initial encounter for closed fracture: Secondary | ICD-10-CM | POA: Diagnosis not present

## 2016-06-20 DIAGNOSIS — F17210 Nicotine dependence, cigarettes, uncomplicated: Secondary | ICD-10-CM | POA: Insufficient documentation

## 2016-06-20 DIAGNOSIS — N320 Bladder-neck obstruction: Secondary | ICD-10-CM | POA: Diagnosis not present

## 2016-06-20 DIAGNOSIS — R269 Unspecified abnormalities of gait and mobility: Secondary | ICD-10-CM | POA: Insufficient documentation

## 2016-06-20 DIAGNOSIS — G894 Chronic pain syndrome: Secondary | ICD-10-CM | POA: Diagnosis not present

## 2016-06-20 DIAGNOSIS — T1490XA Injury, unspecified, initial encounter: Secondary | ICD-10-CM | POA: Diagnosis not present

## 2016-06-20 DIAGNOSIS — R339 Retention of urine, unspecified: Secondary | ICD-10-CM | POA: Diagnosis not present

## 2016-06-20 DIAGNOSIS — Z79899 Other long term (current) drug therapy: Secondary | ICD-10-CM

## 2016-06-20 DIAGNOSIS — G8222 Paraplegia, incomplete: Secondary | ICD-10-CM | POA: Insufficient documentation

## 2016-06-20 DIAGNOSIS — C61 Malignant neoplasm of prostate: Secondary | ICD-10-CM | POA: Diagnosis not present

## 2016-06-20 DIAGNOSIS — Z5181 Encounter for therapeutic drug level monitoring: Secondary | ICD-10-CM | POA: Diagnosis not present

## 2016-06-20 DIAGNOSIS — G8918 Other acute postprocedural pain: Secondary | ICD-10-CM

## 2016-06-20 DIAGNOSIS — E871 Hypo-osmolality and hyponatremia: Secondary | ICD-10-CM | POA: Diagnosis not present

## 2016-06-20 DIAGNOSIS — S0500XA Injury of conjunctiva and corneal abrasion without foreign body, unspecified eye, initial encounter: Secondary | ICD-10-CM | POA: Insufficient documentation

## 2016-06-20 DIAGNOSIS — D62 Acute posthemorrhagic anemia: Secondary | ICD-10-CM | POA: Diagnosis not present

## 2016-06-20 MED ORDER — OXYCODONE HCL 10 MG PO TABS: 5.0000 mg | ORAL_TABLET | Freq: Two times a day (BID) | ORAL | 0 refills | Status: DC | PRN

## 2016-06-20 MED ORDER — OXYCODONE HCL ER 20 MG PO T12A: 20.0000 mg | EXTENDED_RELEASE_TABLET | Freq: Two times a day (BID) | ORAL | 0 refills | Status: DC

## 2016-06-20 MED ORDER — TRAMADOL HCL 50 MG PO TABS: 25.0000 mg | ORAL_TABLET | Freq: Two times a day (BID) | ORAL | 0 refills | Status: DC | PRN

## 2016-06-20 MED FILL — oxyCODONE HCL 10 MG TABS: 10 | 10 days supply | Qty: 20 | Fill #0

## 2016-06-20 NOTE — Addendum Note (Signed)
Addended by: Caro Hight on: 06/20/2016 03:12 PM   Modules accepted: Orders
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
## 2016-06-23 ENCOUNTER — Ambulatory Visit: Payer: Medicaid Other

## 2016-06-24 ENCOUNTER — Ambulatory Visit: Payer: Medicaid Other

## 2016-06-25 ENCOUNTER — Ambulatory Visit: Payer: Medicaid Other

## 2016-06-26 ENCOUNTER — Ambulatory Visit: Payer: Medicaid Other

## 2016-06-26 LAB — TOXASSURE SELECT,+ANTIDEPR,UR

## 2016-06-27 ENCOUNTER — Ambulatory Visit: Payer: Medicaid Other

## 2016-06-28 ENCOUNTER — Ambulatory Visit: Payer: Medicaid Other

## 2016-06-30 ENCOUNTER — Ambulatory Visit: Payer: Medicaid Other

## 2016-06-30 ENCOUNTER — Encounter: Payer: Medicaid Other | Attending: Physical Medicine & Rehabilitation | Admitting: Physical Medicine & Rehabilitation

## 2016-06-30 ENCOUNTER — Encounter: Payer: Self-pay | Admitting: Physical Medicine & Rehabilitation

## 2016-06-30 VITALS — BP 105/73 | HR 94

## 2016-06-30 DIAGNOSIS — S22081A Stable burst fracture of T11-T12 vertebra, initial encounter for closed fracture: Secondary | ICD-10-CM

## 2016-06-30 DIAGNOSIS — C61 Malignant neoplasm of prostate: Secondary | ICD-10-CM | POA: Diagnosis not present

## 2016-06-30 DIAGNOSIS — K769 Liver disease, unspecified: Secondary | ICD-10-CM | POA: Insufficient documentation

## 2016-06-30 DIAGNOSIS — S0101XD Laceration without foreign body of scalp, subsequent encounter: Secondary | ICD-10-CM | POA: Diagnosis not present

## 2016-06-30 DIAGNOSIS — G8220 Paraplegia, unspecified: Secondary | ICD-10-CM

## 2016-06-30 DIAGNOSIS — S0501XA Injury of conjunctiva and corneal abrasion without foreign body, right eye, initial encounter: Secondary | ICD-10-CM | POA: Insufficient documentation

## 2016-06-30 DIAGNOSIS — J90 Pleural effusion, not elsewhere classified: Secondary | ICD-10-CM | POA: Diagnosis not present

## 2016-06-30 DIAGNOSIS — F17210 Nicotine dependence, cigarettes, uncomplicated: Secondary | ICD-10-CM | POA: Diagnosis not present

## 2016-06-30 DIAGNOSIS — S22082A Unstable burst fracture of T11-T12 vertebra, initial encounter for closed fracture: Secondary | ICD-10-CM | POA: Diagnosis not present

## 2016-06-30 DIAGNOSIS — G8222 Paraplegia, incomplete: Secondary | ICD-10-CM | POA: Diagnosis present

## 2016-06-30 DIAGNOSIS — R269 Unspecified abnormalities of gait and mobility: Secondary | ICD-10-CM | POA: Insufficient documentation

## 2016-06-30 DIAGNOSIS — N320 Bladder-neck obstruction: Secondary | ICD-10-CM | POA: Insufficient documentation

## 2016-06-30 DIAGNOSIS — E871 Hypo-osmolality and hyponatremia: Secondary | ICD-10-CM | POA: Insufficient documentation

## 2016-06-30 DIAGNOSIS — D62 Acute posthemorrhagic anemia: Secondary | ICD-10-CM | POA: Diagnosis not present

## 2016-06-30 DIAGNOSIS — L89312 Pressure ulcer of right buttock, stage 2: Secondary | ICD-10-CM | POA: Diagnosis not present

## 2016-06-30 DIAGNOSIS — G959 Disease of spinal cord, unspecified: Secondary | ICD-10-CM | POA: Diagnosis not present

## 2016-06-30 DIAGNOSIS — I10 Essential (primary) hypertension: Secondary | ICD-10-CM | POA: Diagnosis not present

## 2016-06-30 DIAGNOSIS — Z5181 Encounter for therapeutic drug level monitoring: Secondary | ICD-10-CM | POA: Diagnosis not present

## 2016-06-30 MED ORDER — OXYCODONE HCL 10 MG PO TABS: 10.0000 mg | ORAL_TABLET | Freq: Two times a day (BID) | ORAL | 0 refills | Status: DC | PRN

## 2016-06-30 MED ORDER — OXYCODONE HCL ER 20 MG PO T12A: 20.0000 mg | EXTENDED_RELEASE_TABLET | Freq: Two times a day (BID) | ORAL | 0 refills | Status: DC

## 2016-06-30 MED FILL — oxyCODONE HCL 10 MG TABS: 10 | 15 days supply | Qty: 60 | Fill #0
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
## 2016-06-30 NOTE — Patient Instructions (Signed)
PLEASE FEEL FREE TO CALL OUR OFFICE WITH ANY PROBLEMS OR QUESTIONS ([phone])      

## 2016-07-01 ENCOUNTER — Ambulatory Visit: Payer: Medicaid Other

## 2016-07-01 ENCOUNTER — Telehealth: Payer: Self-pay | Admitting: *Deleted

## 2016-07-01 NOTE — Telephone Encounter (Signed)
Physical Therapist left a message asking for verbal orders 1week1 to complete patient's PT program.  Patient had missed last weeks appointment

## 2016-07-01 NOTE — Telephone Encounter (Signed)
Approval given

## 2016-07-02 ENCOUNTER — Ambulatory Visit: Payer: Medicaid Other

## 2016-07-02 MED FILL — OxyCONTIN 20 MG T12A: 20 | 30 days supply | Qty: 60 | Fill #0

## 2016-07-02 MED FILL — LEVOTHYROXINE 112 MCG TAB: 112 | 30 days supply | Qty: 30 | Fill #1 | Status: TO

## 2016-07-03 ENCOUNTER — Ambulatory Visit: Payer: Medicaid Other

## 2016-07-04 ENCOUNTER — Ambulatory Visit: Payer: Medicaid Other

## 2016-07-04 ENCOUNTER — Ambulatory Visit: Payer: No Typology Code available for payment source | Admitting: Physical Therapy

## 2016-07-10 ENCOUNTER — Ambulatory Visit: Payer: Medicaid Other | Attending: Physical Medicine & Rehabilitation

## 2016-07-10 DIAGNOSIS — R278 Other lack of coordination: Secondary | ICD-10-CM

## 2016-07-10 DIAGNOSIS — R2689 Other abnormalities of gait and mobility: Secondary | ICD-10-CM | POA: Diagnosis present

## 2016-07-10 DIAGNOSIS — G8220 Paraplegia, unspecified: Secondary | ICD-10-CM | POA: Insufficient documentation
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
## 2016-07-16 ENCOUNTER — Telehealth: Payer: Self-pay | Admitting: *Deleted

## 2016-07-16 MED FILL — GABAPENTIN 300 MG CAPSULE: 300 | 30 days supply | Qty: 90 | Fill #0

## 2016-07-16 NOTE — Telephone Encounter (Signed)
Mr Deutschman called and said he was having bad spasms in his leg. He also added he is seeing his surgeon today.

## 2016-07-18 ENCOUNTER — Other Ambulatory Visit: Payer: Self-pay

## 2016-07-18 MED ORDER — BACLOFEN 10 MG PO TABS: 10.0000 mg | ORAL_TABLET | Freq: Three times a day (TID) | ORAL | 1 refills | Status: DC | PRN

## 2016-07-18 NOTE — Telephone Encounter (Signed)
If he would like, we can change his robaxin to baclofen 10mg  q8 prn #90, 1 RF. This may work better for the type of spasms he's having

## 2016-07-18 NOTE — Telephone Encounter (Signed)
Baclofen has been order 10mg  Q8 prn #90 1 additional refill. Patient has been notified and preferred medication to be sent to [facility] in [place], off [address].

## 2016-07-21 NOTE — Addendum Note (Signed)
Addended by: Elza Rafter on: 07/21/2016 04:49 PM   Modules accepted: Orders

## 2016-07-23 ENCOUNTER — Inpatient Hospital Stay: Payer: Medicaid Other | Admitting: Physical Medicine & Rehabilitation

## 2016-07-23 ENCOUNTER — Ambulatory Visit: Payer: Medicaid Other

## 2016-07-29 ENCOUNTER — Ambulatory Visit: Payer: Medicaid Other | Attending: Physical Medicine & Rehabilitation

## 2016-07-29 DIAGNOSIS — G959 Disease of spinal cord, unspecified: Secondary | ICD-10-CM | POA: Insufficient documentation

## 2016-07-29 DIAGNOSIS — S22081A Stable burst fracture of T11-T12 vertebra, initial encounter for closed fracture: Secondary | ICD-10-CM | POA: Insufficient documentation

## 2016-07-29 DIAGNOSIS — X58XXXA Exposure to other specified factors, initial encounter: Secondary | ICD-10-CM | POA: Insufficient documentation

## 2016-07-29 DIAGNOSIS — G8222 Paraplegia, incomplete: Secondary | ICD-10-CM | POA: Insufficient documentation

## 2016-07-31 ENCOUNTER — Ambulatory Visit: Payer: Medicaid Other

## 2016-07-31 ENCOUNTER — Encounter: Payer: Self-pay | Admitting: Registered Nurse

## 2016-07-31 ENCOUNTER — Encounter: Payer: Medicaid Other | Attending: Physical Medicine & Rehabilitation | Admitting: Registered Nurse

## 2016-07-31 VITALS — BP 159/80 | HR 84

## 2016-07-31 DIAGNOSIS — S22081A Stable burst fracture of T11-T12 vertebra, initial encounter for closed fracture: Secondary | ICD-10-CM

## 2016-07-31 DIAGNOSIS — G8222 Paraplegia, incomplete: Secondary | ICD-10-CM | POA: Insufficient documentation

## 2016-07-31 DIAGNOSIS — G894 Chronic pain syndrome: Secondary | ICD-10-CM | POA: Diagnosis not present

## 2016-07-31 DIAGNOSIS — G4700 Insomnia, unspecified: Secondary | ICD-10-CM

## 2016-07-31 DIAGNOSIS — Z79899 Other long term (current) drug therapy: Secondary | ICD-10-CM

## 2016-07-31 DIAGNOSIS — S22082A Unstable burst fracture of T11-T12 vertebra, initial encounter for closed fracture: Secondary | ICD-10-CM | POA: Insufficient documentation

## 2016-07-31 DIAGNOSIS — E871 Hypo-osmolality and hyponatremia: Secondary | ICD-10-CM | POA: Diagnosis not present

## 2016-07-31 DIAGNOSIS — G8918 Other acute postprocedural pain: Secondary | ICD-10-CM | POA: Diagnosis not present

## 2016-07-31 DIAGNOSIS — F17210 Nicotine dependence, cigarettes, uncomplicated: Secondary | ICD-10-CM | POA: Insufficient documentation

## 2016-07-31 DIAGNOSIS — R2689 Other abnormalities of gait and mobility: Secondary | ICD-10-CM

## 2016-07-31 DIAGNOSIS — K769 Liver disease, unspecified: Secondary | ICD-10-CM | POA: Diagnosis not present

## 2016-07-31 DIAGNOSIS — S0501XA Injury of conjunctiva and corneal abrasion without foreign body, right eye, initial encounter: Secondary | ICD-10-CM | POA: Insufficient documentation

## 2016-07-31 DIAGNOSIS — G8220 Paraplegia, unspecified: Secondary | ICD-10-CM

## 2016-07-31 DIAGNOSIS — Z5181 Encounter for therapeutic drug level monitoring: Secondary | ICD-10-CM | POA: Diagnosis not present

## 2016-07-31 DIAGNOSIS — D62 Acute posthemorrhagic anemia: Secondary | ICD-10-CM | POA: Diagnosis not present

## 2016-07-31 DIAGNOSIS — G959 Disease of spinal cord, unspecified: Secondary | ICD-10-CM | POA: Diagnosis not present

## 2016-07-31 DIAGNOSIS — N320 Bladder-neck obstruction: Secondary | ICD-10-CM | POA: Insufficient documentation

## 2016-07-31 DIAGNOSIS — R269 Unspecified abnormalities of gait and mobility: Secondary | ICD-10-CM | POA: Diagnosis not present

## 2016-07-31 DIAGNOSIS — X58XXXA Exposure to other specified factors, initial encounter: Secondary | ICD-10-CM | POA: Diagnosis not present

## 2016-07-31 DIAGNOSIS — C61 Malignant neoplasm of prostate: Secondary | ICD-10-CM | POA: Diagnosis not present

## 2016-07-31 MED ORDER — OXYCODONE HCL ER 20 MG PO T12A: 20.0000 mg | EXTENDED_RELEASE_TABLET | Freq: Two times a day (BID) | ORAL | 0 refills | Status: DC

## 2016-07-31 MED ORDER — OXYCODONE HCL 10 MG PO TABS: 10.0000 mg | ORAL_TABLET | Freq: Two times a day (BID) | ORAL | 0 refills | Status: DC | PRN

## 2016-07-31 MED ORDER — TRAZODONE HCL 50 MG PO TABS: ORAL_TABLET | ORAL | 2 refills | Status: DC

## 2016-07-31 MED FILL — OxyCONTIN 20 MG T12A: 20 | 30 days supply | Qty: 60 | Fill #0

## 2016-07-31 MED FILL — traZODone HCL 50 MG TABS: 50 | 30 days supply | Qty: 15 | Fill #0

## 2016-07-31 MED FILL — oxyCODONE HCL 10 MG TABS: 10 | 30 days supply | Qty: 60 | Fill #0

## 2016-07-31 NOTE — Patient Instructions (Addendum)
Perform in a corner with a chair in front of you OR at kitchen sink with chair behind you for safety:  Feet Together, Varied Arm Positions - Eyes Closed    Stand with feet together and arms at your side. Close eyes and visualize upright position. Hold __20-30__ seconds. Repeat _3___ times per session. Do __1__ sessions per day.  Copyright  VHI. All rights reserved.  Feet Heel-Toe "Tandem"    With hand on counter, walk 10 feet in a straight line bringing one foot directly in front of the other. Repeat for __4__ laps. Do __1__ sessions per day.  Copyright  VHI. All rights reserved.   Heel Raise: Bilateral (Standing)    Hold onto counter. Rise on balls of feet. Repeat ___10_ times per set. Do __1__ sets per session. Do __1__ sessions per day.  http://orth.exer.us/38   Copyright  VHI. All rights reserved.   Toe Raise (Standing)    Hold onto counter for safety. Rock back on heels. Repeat __10__ times per set. Do __1__ sets per session. Do _1___ sessions per day.  http://orth.exer.us/42   Copyright  VHI. All rights reserved.
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
## 2016-08-01 ENCOUNTER — Telehealth: Payer: Self-pay | Admitting: Registered Nurse

## 2016-08-01 NOTE — Telephone Encounter (Signed)
On 08/01/2016 the [place] was reviewed no conflict was seen on the [place] with multiple prescribers. [name] has a signed narcotic contract with our office. If there were any discrepancies this would have been reported to his physician.

## 2016-08-05 ENCOUNTER — Telehealth: Payer: Self-pay | Admitting: *Deleted

## 2016-08-05 ENCOUNTER — Ambulatory Visit: Payer: Medicaid Other | Admitting: Physical Therapy

## 2016-08-05 ENCOUNTER — Encounter: Payer: Self-pay | Admitting: Physical Therapy

## 2016-08-05 DIAGNOSIS — R2689 Other abnormalities of gait and mobility: Secondary | ICD-10-CM

## 2016-08-05 DIAGNOSIS — S22081A Stable burst fracture of T11-T12 vertebra, initial encounter for closed fracture: Secondary | ICD-10-CM

## 2016-08-05 NOTE — Telephone Encounter (Signed)
Patient contacting Zella Ball, says he spoke with her yesterday. He reports that he is having swelling in his legs and feet and it seems that there is drainage at the surgical incision site on his back.  He is asking for a call back from [place]

## 2016-08-05 NOTE — Therapy (Signed)
[facility] [address] [place], [place], [address] Phone: [phone]   Fax:  [phone]  Patient Details  Name: [name] MRN: [id number] Date of Birth: 1960/06/24 Referring Provider:  Theotis Burrow*  Encounter Date: 08/05/2016   Patient arrived with no charge due to medical issues.  Reports he had a lot of swelling in feet and legs over the weekend. Reports he started having "water coming out of my back" (clarified drainage from his incision) and his girlfriend told him Saturday she thought he felt real hot. States he called neurosurgeon (Dr. Trenton Gammon)  this morning and has not gotten a return call. Inspected incision and noted ~13mm area dehiscence with small amount of clear drainage present. Covered area with clean bandaid.  Patient reporting increased pain bil LE and walking much worse than previous visit (needing to return to use of walker). "I'm not sure what I can even do today."  Instructed patient to call Dr. Irven Baltimore office again if he does not hear from them by 2:00 pm. Call his oncologist as he is the MD that prescribes his "water pill." He started a new sleeping medication on Friday and he wondered if this might be related. Educated him to make other doctors aware and he could check with his pharmacist to see if LE edema could be related to change in his medications.   Educated patient on currently only approved for 3 Medicaid visits with PT continuing to follow up with Medicaid re: reasoning for denying additional visits. Patient assisted to walk to lobby (min assist).   Rexanne Mano, PT 08/05/2016, 11:42 AM  [facility] [address] [place], [place], [address] Phone: [phone]   Fax:  [phone]

## 2016-08-06 ENCOUNTER — Telehealth: Payer: Self-pay | Admitting: Registered Nurse

## 2016-08-06 MED ORDER — ZOLPIDEM TARTRATE 5 MG PO TABS: 5.0000 mg | ORAL_TABLET | Freq: Every evening | ORAL | 1 refills | Status: DC | PRN

## 2016-08-06 NOTE — Telephone Encounter (Signed)
Return [name] call, I didn't speak to him  On yesterday. I prescribed Trazodone on 07/31/2016, he states he has noticed swelling in legs and feet. Reviewed Trazodone side effects. He was instructed to stopped the Trazodone.  He placed a call to his surgeon regarding drainage from his back, he has an appointment on 08/07/2016.  Also states in the past he was on amitriptyline he developed hallucinations. Also has tried melatonin with no effect. Will speak with Dr. Naaman Plummer and return the call, he verbalizes understanding.

## 2016-08-06 NOTE — Telephone Encounter (Signed)
Spoke with Dr. Naaman Plummer regarding [name] insomnia, he agrees with Ambien ordered.  Call placed to [name] he is aware medication  ( Ambien) has been called into the pharmacy, he verbalizes understanding. Also re-iterated this prescription must last him one month. He verbalizes understanding.

## 2016-08-08 ENCOUNTER — Ambulatory Visit: Payer: Self-pay | Admitting: Physical Therapy

## 2016-08-12 ENCOUNTER — Ambulatory Visit: Payer: Self-pay

## 2016-08-14 ENCOUNTER — Ambulatory Visit: Payer: Medicaid Other | Admitting: Physical Therapy

## 2016-08-26 ENCOUNTER — Encounter: Payer: Self-pay | Admitting: Registered Nurse

## 2016-08-26 ENCOUNTER — Encounter: Payer: Medicaid Other | Attending: Physical Medicine & Rehabilitation | Admitting: Registered Nurse

## 2016-08-26 VITALS — BP 162/83 | HR 80

## 2016-08-26 DIAGNOSIS — F17210 Nicotine dependence, cigarettes, uncomplicated: Secondary | ICD-10-CM | POA: Insufficient documentation

## 2016-08-26 DIAGNOSIS — K769 Liver disease, unspecified: Secondary | ICD-10-CM | POA: Insufficient documentation

## 2016-08-26 DIAGNOSIS — M542 Cervicalgia: Secondary | ICD-10-CM

## 2016-08-26 DIAGNOSIS — M25512 Pain in left shoulder: Secondary | ICD-10-CM

## 2016-08-26 DIAGNOSIS — G4700 Insomnia, unspecified: Secondary | ICD-10-CM

## 2016-08-26 DIAGNOSIS — M62838 Other muscle spasm: Secondary | ICD-10-CM | POA: Diagnosis not present

## 2016-08-26 DIAGNOSIS — M25511 Pain in right shoulder: Secondary | ICD-10-CM | POA: Diagnosis not present

## 2016-08-26 DIAGNOSIS — R269 Unspecified abnormalities of gait and mobility: Secondary | ICD-10-CM | POA: Diagnosis not present

## 2016-08-26 DIAGNOSIS — Z5181 Encounter for therapeutic drug level monitoring: Secondary | ICD-10-CM | POA: Diagnosis not present

## 2016-08-26 DIAGNOSIS — Z79899 Other long term (current) drug therapy: Secondary | ICD-10-CM

## 2016-08-26 DIAGNOSIS — G8222 Paraplegia, incomplete: Secondary | ICD-10-CM | POA: Diagnosis present

## 2016-08-26 DIAGNOSIS — E871 Hypo-osmolality and hyponatremia: Secondary | ICD-10-CM | POA: Diagnosis not present

## 2016-08-26 DIAGNOSIS — S22082A Unstable burst fracture of T11-T12 vertebra, initial encounter for closed fracture: Secondary | ICD-10-CM | POA: Insufficient documentation

## 2016-08-26 DIAGNOSIS — S0501XA Injury of conjunctiva and corneal abrasion without foreign body, right eye, initial encounter: Secondary | ICD-10-CM | POA: Insufficient documentation

## 2016-08-26 DIAGNOSIS — C61 Malignant neoplasm of prostate: Secondary | ICD-10-CM | POA: Insufficient documentation

## 2016-08-26 DIAGNOSIS — N320 Bladder-neck obstruction: Secondary | ICD-10-CM | POA: Insufficient documentation

## 2016-08-26 DIAGNOSIS — G894 Chronic pain syndrome: Secondary | ICD-10-CM

## 2016-08-26 DIAGNOSIS — S22081A Stable burst fracture of T11-T12 vertebra, initial encounter for closed fracture: Secondary | ICD-10-CM | POA: Diagnosis not present

## 2016-08-26 DIAGNOSIS — D62 Acute posthemorrhagic anemia: Secondary | ICD-10-CM | POA: Diagnosis not present

## 2016-08-26 MED ORDER — OXYCODONE HCL ER 20 MG PO T12A: 20.0000 mg | EXTENDED_RELEASE_TABLET | Freq: Two times a day (BID) | ORAL | 0 refills | Status: DC

## 2016-08-26 MED ORDER — OXYCODONE HCL 10 MG PO TABS: 10.0000 mg | ORAL_TABLET | Freq: Two times a day (BID) | ORAL | 0 refills | Status: DC | PRN
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
## 2016-08-31 LAB — TOXASSURE SELECT,+ANTIDEPR,UR

## 2016-09-02 ENCOUNTER — Telehealth: Payer: Self-pay | Admitting: *Deleted

## 2016-09-02 NOTE — Telephone Encounter (Signed)
Second inconsistent result. Discharged from practice. I will give him taper of meds if desired

## 2016-09-02 NOTE — Telephone Encounter (Signed)
Discharge letter is written, and I spoke with him.  He just filled the meds 08/27/16 but he says you can write a taper dose for both meds and he will try and find a new doctor.

## 2016-09-02 NOTE — Telephone Encounter (Signed)
Urine drug screen for this encounter is inconsistent. Prescribed oxycodone is present but hydrocodone and its metabolites are present as well.  Ambien is not always detected even when taking and he was taken off trazodone. The inconsistency is the presence of hydrocodone which is not being prescribed.

## 2016-09-03 ENCOUNTER — Ambulatory Visit: Payer: Medicaid Other | Attending: Physical Medicine & Rehabilitation | Admitting: Physical Therapy

## 2016-09-03 DIAGNOSIS — G8220 Paraplegia, unspecified: Secondary | ICD-10-CM | POA: Insufficient documentation

## 2016-09-03 DIAGNOSIS — R278 Other lack of coordination: Secondary | ICD-10-CM | POA: Diagnosis present

## 2016-09-03 DIAGNOSIS — R2689 Other abnormalities of gait and mobility: Secondary | ICD-10-CM
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
## 2016-09-03 NOTE — Telephone Encounter (Signed)
Can decrease oxycontin to 20mg  daily until bottle empty then stop. Continue with oxycodone IR  10mg  q12 prn during oxycontin taper. Will refill him at 5mg  q12 prn for #30 pills if needed when the 10mg  IR's run out

## 2016-09-04 NOTE — Telephone Encounter (Signed)
Letter printed with directions after unable to reach by phone.  Mailed directions.

## 2016-09-17 ENCOUNTER — Telehealth: Payer: Self-pay | Admitting: *Deleted

## 2016-09-17 DIAGNOSIS — S22081A Stable burst fracture of T11-T12 vertebra, initial encounter for closed fracture: Secondary | ICD-10-CM

## 2016-09-17 MED ORDER — OXYCODONE HCL ER 10 MG PO T12A: 10.0000 mg | EXTENDED_RELEASE_TABLET | ORAL | 0 refills | Status: DC

## 2016-09-17 MED ORDER — OXYCODONE HCL 10 MG PO TABS: 5.0000 mg | ORAL_TABLET | Freq: Two times a day (BID) | ORAL | 0 refills | Status: DC | PRN

## 2016-09-17 NOTE — Telephone Encounter (Signed)
[name] notified.  Prescriptions placed up front for pick up.

## 2016-09-17 NOTE — Telephone Encounter (Signed)
rx'es written 
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
## 2016-09-18 ENCOUNTER — Telehealth: Payer: Self-pay | Admitting: *Deleted

## 2016-09-18 NOTE — Telephone Encounter (Signed)
[name] picked up his final prescription from our office yesterday 09/17/16.  His girlfriend called to ask if we would approve him to get the medication filled. I first asked why she was calling and not him.  She said he was beside her and so I had her put him on the phone.  They are saying that insurance will pay for medication but pharmacy says they need approval from [name] to fill early.  I told him we would not give approval because his last fill was on 08/27/16 and it is a week early and the reason he was being discharged was because he was not taking his medication as prescribed and had unprescribed medication in his urine.. I asked if he was out already and he said no he had a few pills left.  I told him he would have to wait until the pharmacy would fill without our permission.

## 2016-09-19 ENCOUNTER — Ambulatory Visit: Payer: Self-pay | Admitting: Registered Nurse

## 2016-10-31 IMAGING — CT CT ABD-PELV W/ CM
2 of 6 series · 16 of 46 positions shown, 18 images · IV contrast (iopamidol)
Comparison: 07/09/2015

CLINICAL DATA: Continued leakage of clear fluid from recent
incision. Continued abdominal pain. No fever or chills.

EXAM:
CT ABDOMEN AND PELVIS WITH CONTRAST
TECHNIQUE: Multidetector CT imaging of the abdomen and pelvis was performed
using the standard protocol following bolus administration of
intravenous contrast.
CONTRAST:  100mL 2XXTQZ-LJJ IOPAMIDOL (2XXTQZ-LJJ) INJECTION 61%

[Series 2: abd/pel with · axial · 0.83mm/px · z∈[+1035,+1425]mm · 13 of 90 slices shown, 15 images]
[im 6/90  soft-tissue]
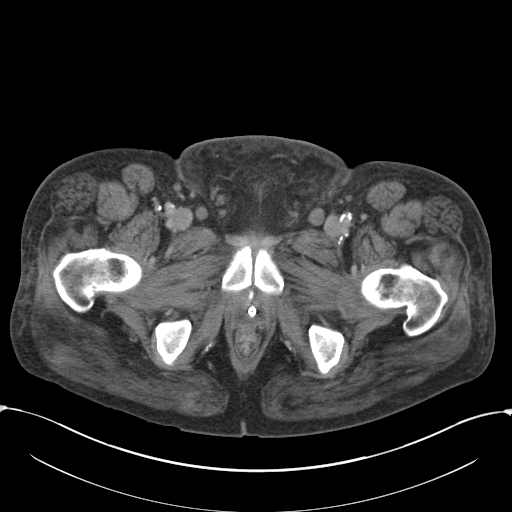
[im 6/90  bone]
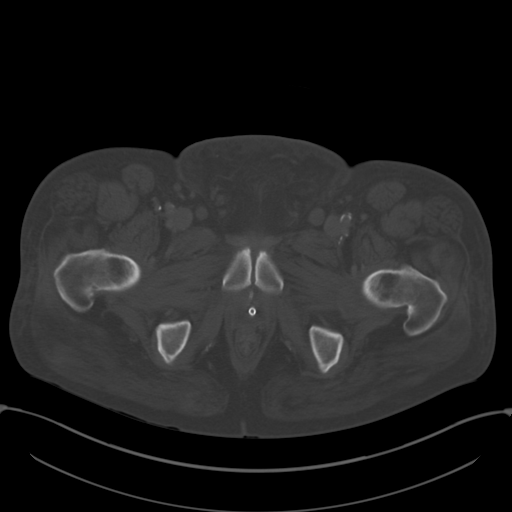
[im 12/90  soft-tissue]
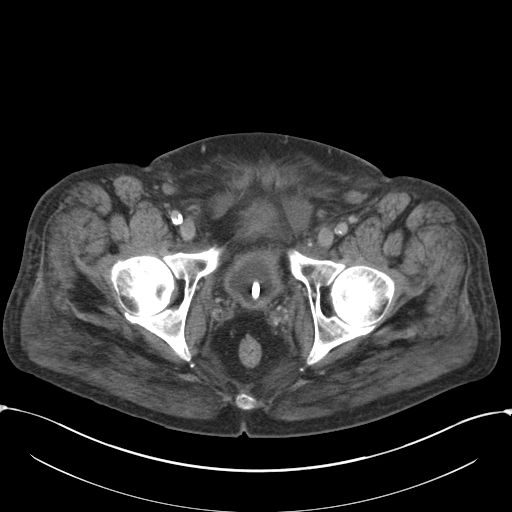
[im 17/90  soft-tissue]
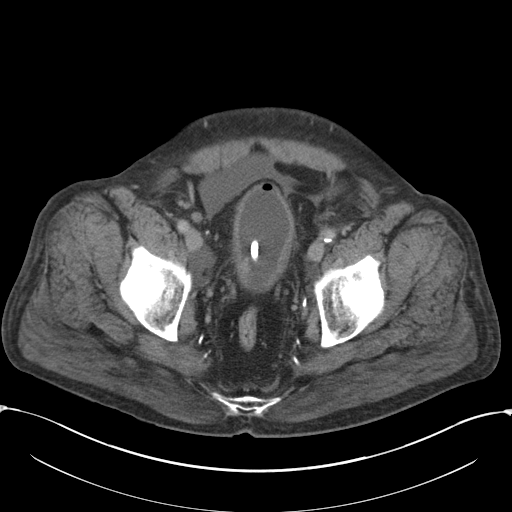
[im 28/90  soft-tissue]
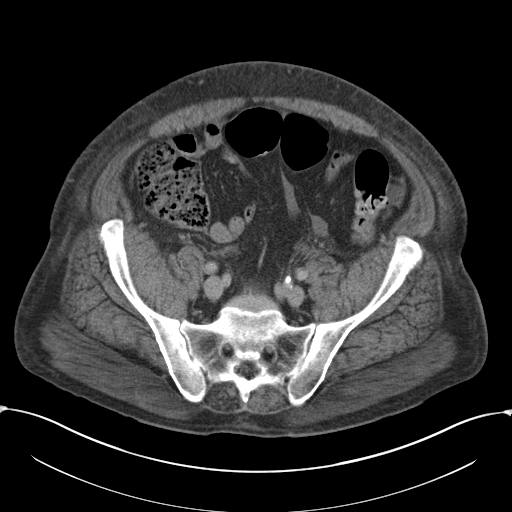
[im 34/90  soft-tissue]
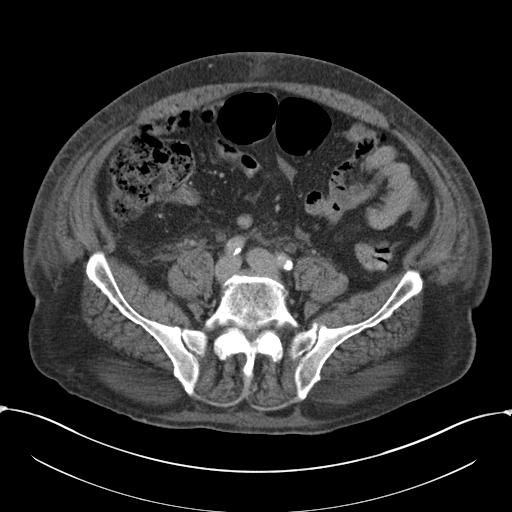
[im 39/90  soft-tissue]
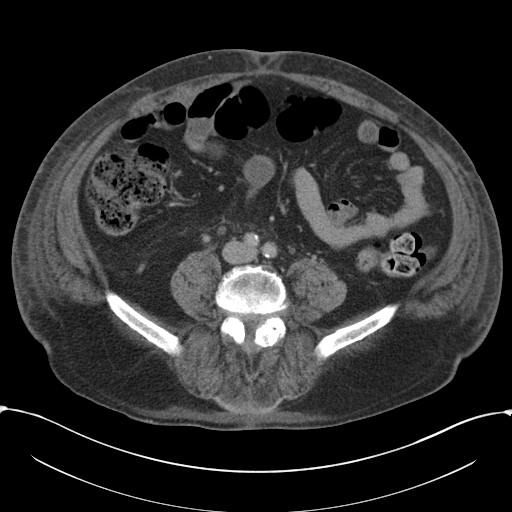
[im 45/90  soft-tissue]
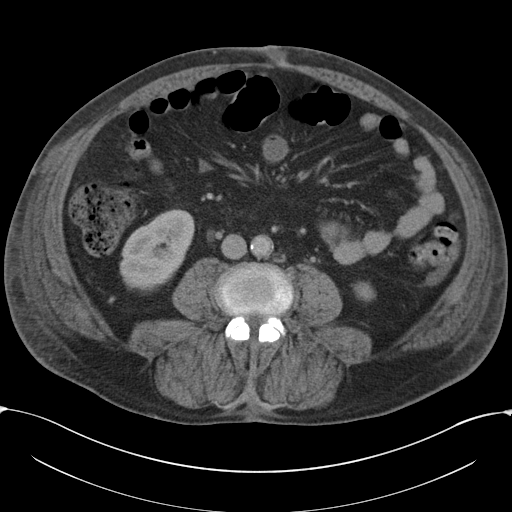
[im 51/90  soft-tissue]
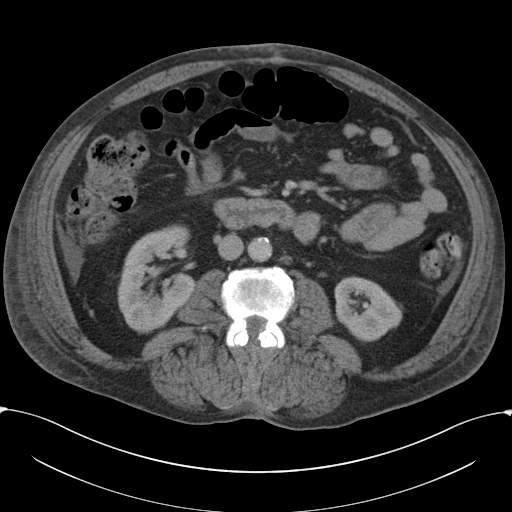
[im 56/90  soft-tissue]
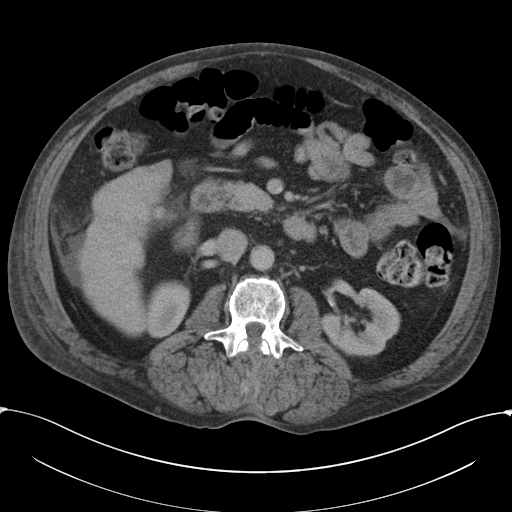
[im 56/90  bone]
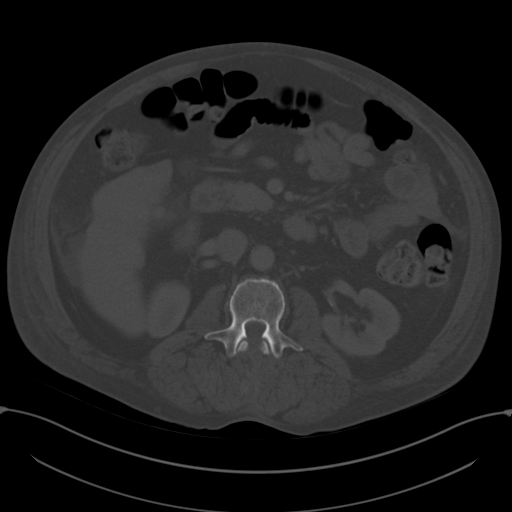
[im 62/90  soft-tissue]
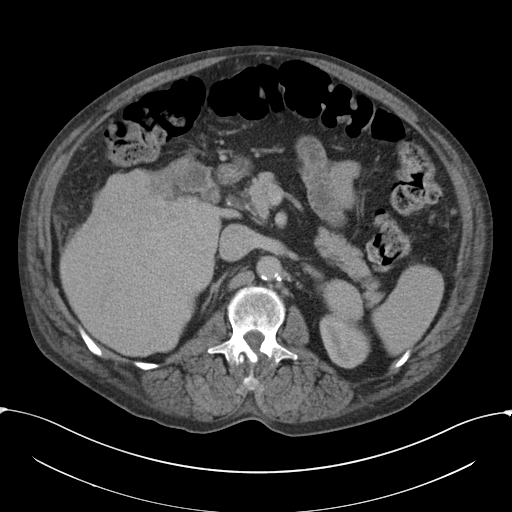
[im 73/90  soft-tissue]
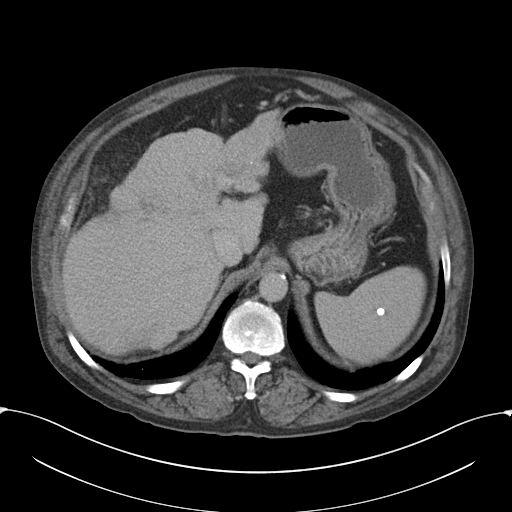
[im 78/90  soft-tissue]
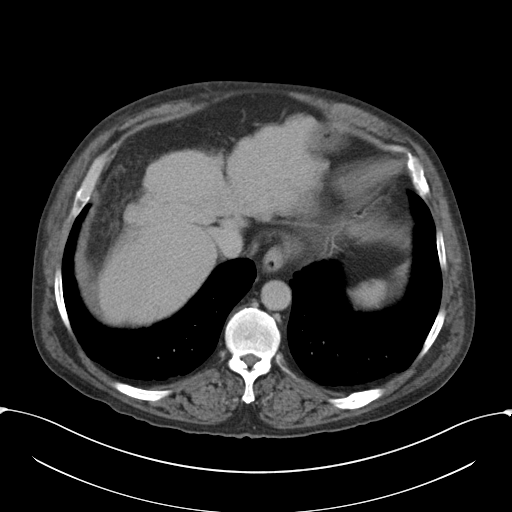
[im 84/90  soft-tissue]
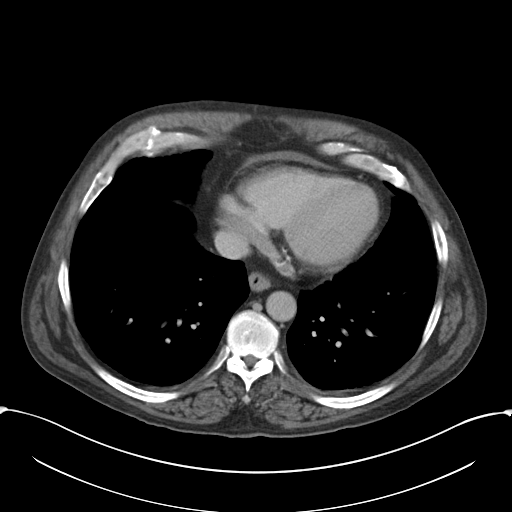

[Series 4: coronal a/|p · coronal · 0.79mm/px · 3 of 161 slices shown]
[im 54/161  soft-tissue]
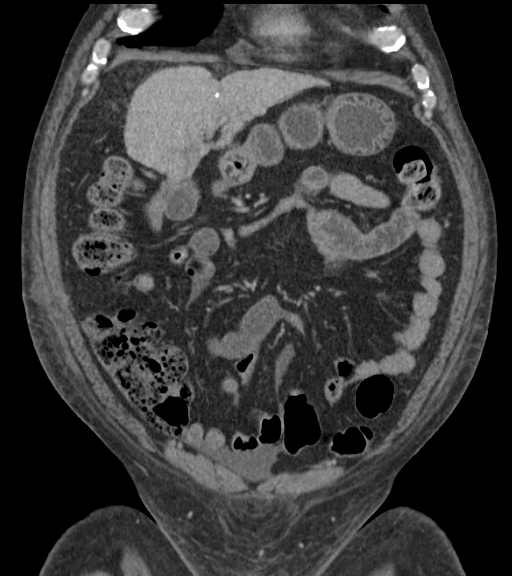
[im 72/161  soft-tissue]
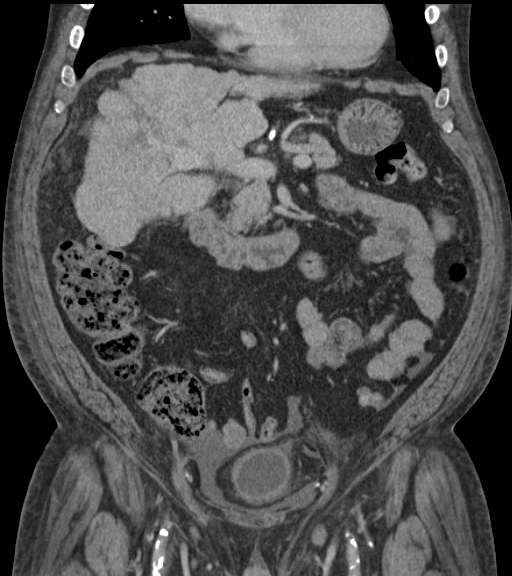
[im 89/161  soft-tissue]
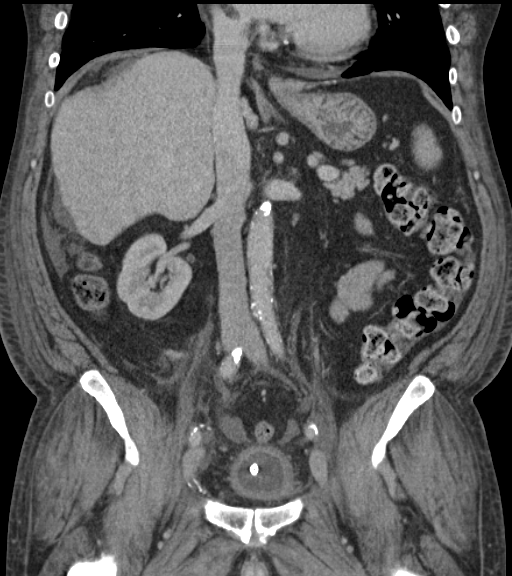

[16 of 46 positions shown; findings below may reference images not displayed]

FINDINGS: Lower chest: Mild ground-glass opacities in the central lung bases.
This could be infectious or atelectatic. No suspicious nodules. No
pleural effusions. Very small pericardial effusion.

Hepatobiliary: Cirrhotic liver. No suspicious focal lesions. No
significant interval change from 02/21/2014. Portal vein is patent.
Gallbladder and bile ducts are unremarkable.

Pancreas: Normal

Spleen: Normal.  Incidental granuloma.

Adrenals/Urinary Tract: The adrenals and kidneys are normal in
appearance. There is no urinary calculus evident. There is no
hydronephrosis or ureteral dilatation. Collecting systems and
ureters appear unremarkable. Urinary bladder is partially
decompressed around a Foley catheter.

Stomach/Bowel: The stomach, small bowel and colon are unremarkable.
Appendix is absent.

Vascular/Lymphatic: The abdominal aorta is normal in caliber. There
is mild atherosclerotic calcification. There is no adenopathy in the
abdomen or pelvis.

Reproductive: Unremarkable

Other: There is a small volume ascites. There is mild inflammation
in the subcutaneous tissues at the umbilicus. There is a small
umbilical hernia defect at this level.

Musculoskeletal: No significant skeletal lesion
IMPRESSION: 1. Cirrhosis.  Small volume ascites.
2. Mild ground-glass opacities in both lung bases. This could be
infectious or atelectatic.
3. Mild inflammation in the periumbilical subcutaneous tissues.
Small umbilical hernia defect. The described clear fluid drainage at
this site could represent leakage of ascites across the hernia
defect. No evidence of communication with bowel or abscess. No
hematoma. No discrete seroma.

## 2016-11-07 NOTE — Therapy (Signed)
[facility] [address] [place], [place], [address] Phone: [phone]   Fax:  [phone]  Patient Details  Name: [name] MRN: [id number] Date of Birth: [birth year] Referring Provider:  No ref. provider found  Encounter Date: 11-08-2016  PHYSICAL THERAPY DISCHARGE SUMMARY  Visits from Start of Care: 3  Current functional level related to goals / functional outcomes:     PT Short Term Goals - 09/03/16 1124      PT SHORT TERM GOAL #1   Title Pt will be IND in HEP to improve strength, balance, and flexibility. TARGET DATE FOR ALL STGS: 08/07/16   Status Achieved     PT SHORT TERM GOAL #2   Title Pt will improve gait speed to >/=2.4f/sec., with LRAD, to decr. falls risk.   Baseline 09/03/16: 1.19 ft/sec with SPC   Status Not Met     PT SHORT TERM GOAL #3   Title Pt will amb. 500' over even terrain, with LRAD at MOD I level, to improve functional mobility.   Baseline 09/03/16: 300' with SPC and minguar A   Status On-going     PT SHORT TERM GOAL #4   Title Perform BERG and write STG as indicated.   Status Achieved     PT SHORT TERM GOAL #5   Title Pt will improve BERG score to >/=43/56 to decr. falls risk.    Baseline 09/03/16: 36/56   Status Not Met         PT Long Term Goals - 07/31/16 1209      PT LONG TERM GOAL #1   Title Pt will improve gait speed to >/=2.62 ft/sec. with LRAD to ambulate safely in the community. TARGET DATE FOR ALL LTGS: 10/02/16   Baseline 1.767fsec with RW   Status New     PT LONG TERM GOAL #2   Title Pt will amb. 1000' over even/paved surfaces, with LRAD at MOD I level, to improve functional mobility and attend prostate CA treatments.   Baseline 100' with RW and S   Status New     PT LONG TERM GOAL #3   Title Perform BERG and write LTG as indicated.   Baseline Pt required min guard to min A to stand with feet together with eyes closed for < 5seconds.    Status Achieved     PT LONG TERM GOAL #4   Title Pt will amb. 50', without AD, with S in order to amb. safely at home, while performing ADLs.    Baseline 100' over even terrain with RW and S   Status New     PT LONG TERM GOAL #5   Title Pt will improve BERG score to >/=46/56 to decr. falls risk.    Status New        Remaining deficits: Unknown, as pt did not return since last visit.    Education / Equipment: HEP  Plan: Patient agrees to discharge.  Patient goals were not met. Patient is being discharged due to not returning since the last visit.  ?????       Demarian Epps L 7/July 14, 201811:27 AM      G-Codes - [id number]    Functional Assessment Tool Used (Outpatient Only) Gait speed: 1.1921fec with SPC and BERG: 36/56   Functional Limitation Mobility: Walking and moving around   Mobility: Walking and Moving Around Goal Status ([id number] least 20 percent but less than 40 percent impaired, limited or restricted   Mobility:  Walking and Moving Around Discharge Status [id number]) At least 60 percent but less than 80 percent impaired, limited or restricted      [facility] [address] [place], [place], [address] Phone: [phone]   Fax:  [phone]   Geoffry Paradise, PT,DPT 11/07/16 11:27 AM Phone: [phone] Fax: [phone]

## 2016-11-20 MED FILL — XIFAXAN 550 MG TABLET: 550 | 30 days supply | Qty: 60 | Fill #1

## 2016-11-21 ENCOUNTER — Encounter (HOSPITAL_COMMUNITY)
Admission: AD | Disposition: A | Discharge status: Home Health | Payer: Self-pay | Source: Other Acute Inpatient Hospital | Attending: Internal Medicine

## 2016-11-21 ENCOUNTER — Encounter (HOSPITAL_COMMUNITY): Payer: Self-pay

## 2016-11-21 ENCOUNTER — Inpatient Hospital Stay (HOSPITAL_COMMUNITY): Payer: Medicaid Other

## 2016-11-21 ENCOUNTER — Inpatient Hospital Stay (HOSPITAL_COMMUNITY): Payer: Medicaid Other | Admitting: Certified Registered"

## 2016-11-21 ENCOUNTER — Inpatient Hospital Stay (HOSPITAL_COMMUNITY)
Admission: AD | Admit: 2016-11-21 | Discharge: 2016-11-25 | DRG: 857 | Disposition: A | Discharge status: Home Health | Payer: Medicaid Other | Source: Other Acute Inpatient Hospital | Attending: Internal Medicine | Admitting: Internal Medicine

## 2016-11-21 DIAGNOSIS — Z6830 Body mass index (BMI) 30.0-30.9, adult: Secondary | ICD-10-CM | POA: Diagnosis not present

## 2016-11-21 DIAGNOSIS — T814XXA Infection following a procedure, initial encounter: Secondary | ICD-10-CM | POA: Diagnosis not present

## 2016-11-21 DIAGNOSIS — Z885 Allergy status to narcotic agent status: Secondary | ICD-10-CM

## 2016-11-21 DIAGNOSIS — Z8546 Personal history of malignant neoplasm of prostate: Secondary | ICD-10-CM | POA: Diagnosis not present

## 2016-11-21 DIAGNOSIS — D649 Anemia, unspecified: Secondary | ICD-10-CM | POA: Diagnosis present

## 2016-11-21 DIAGNOSIS — E039 Hypothyroidism, unspecified: Secondary | ICD-10-CM | POA: Diagnosis present

## 2016-11-21 DIAGNOSIS — K7030 Alcoholic cirrhosis of liver without ascites: Secondary | ICD-10-CM | POA: Diagnosis not present

## 2016-11-21 DIAGNOSIS — Z833 Family history of diabetes mellitus: Secondary | ICD-10-CM | POA: Diagnosis not present

## 2016-11-21 DIAGNOSIS — R7881 Bacteremia: Secondary | ICD-10-CM | POA: Diagnosis present

## 2016-11-21 DIAGNOSIS — Z811 Family history of alcohol abuse and dependence: Secondary | ICD-10-CM | POA: Diagnosis not present

## 2016-11-21 DIAGNOSIS — B9561 Methicillin susceptible Staphylococcus aureus infection as the cause of diseases classified elsewhere: Secondary | ICD-10-CM | POA: Diagnosis not present

## 2016-11-21 DIAGNOSIS — Z981 Arthrodesis status: Secondary | ICD-10-CM

## 2016-11-21 DIAGNOSIS — T8149XA Infection following a procedure, other surgical site, initial encounter: Secondary | ICD-10-CM | POA: Diagnosis present

## 2016-11-21 DIAGNOSIS — E876 Hypokalemia: Secondary | ICD-10-CM | POA: Diagnosis not present

## 2016-11-21 DIAGNOSIS — Z8547 Personal history of malignant neoplasm of testis: Secondary | ICD-10-CM

## 2016-11-21 DIAGNOSIS — Z923 Personal history of irradiation: Secondary | ICD-10-CM | POA: Diagnosis not present

## 2016-11-21 DIAGNOSIS — M549 Dorsalgia, unspecified: Secondary | ICD-10-CM | POA: Diagnosis present

## 2016-11-21 DIAGNOSIS — F17210 Nicotine dependence, cigarettes, uncomplicated: Secondary | ICD-10-CM | POA: Diagnosis present

## 2016-11-21 DIAGNOSIS — D6959 Other secondary thrombocytopenia: Secondary | ICD-10-CM | POA: Diagnosis not present

## 2016-11-21 DIAGNOSIS — S22081A Stable burst fracture of T11-T12 vertebra, initial encounter for closed fracture: Secondary | ICD-10-CM

## 2016-11-21 DIAGNOSIS — Z806 Family history of leukemia: Secondary | ICD-10-CM | POA: Diagnosis not present

## 2016-11-21 DIAGNOSIS — T8131XA Disruption of external operation (surgical) wound, not elsewhere classified, initial encounter: Secondary | ICD-10-CM | POA: Diagnosis present

## 2016-11-21 DIAGNOSIS — K7460 Unspecified cirrhosis of liver: Secondary | ICD-10-CM | POA: Diagnosis not present

## 2016-11-21 DIAGNOSIS — Z886 Allergy status to analgesic agent status: Secondary | ICD-10-CM

## 2016-11-21 DIAGNOSIS — E871 Hypo-osmolality and hyponatremia: Secondary | ICD-10-CM | POA: Diagnosis present

## 2016-11-21 DIAGNOSIS — Y839 Surgical procedure, unspecified as the cause of abnormal reaction of the patient, or of later complication, without mention of misadventure at the time of the procedure: Secondary | ICD-10-CM | POA: Diagnosis not present

## 2016-11-21 DIAGNOSIS — C61 Malignant neoplasm of prostate: Secondary | ICD-10-CM | POA: Diagnosis present

## 2016-11-21 DIAGNOSIS — E669 Obesity, unspecified: Secondary | ICD-10-CM | POA: Diagnosis present

## 2016-11-21 DIAGNOSIS — T814XXD Infection following a procedure, subsequent encounter: Secondary | ICD-10-CM | POA: Diagnosis not present

## 2016-11-21 DIAGNOSIS — I340 Nonrheumatic mitral (valve) insufficiency: Secondary | ICD-10-CM | POA: Diagnosis not present

## 2016-11-21 DIAGNOSIS — R0602 Shortness of breath: Secondary | ICD-10-CM

## 2016-11-21 HISTORY — PX: WOUND EXPLORATION: SHX6188

## 2016-11-21 HISTORY — DX: Other specified postprocedural states: Z98.890

## 2016-11-21 HISTORY — DX: Nausea with vomiting, unspecified: R11.2

## 2016-11-21 LAB — COMPREHENSIVE METABOLIC PANEL
ALBUMIN: 2.7 g/dL — AB (ref 3.5–5.0)
ALT: 10 U/L — ABNORMAL LOW (ref 17–63)
ANION GAP: 5 (ref 5–15)
AST: 15 U/L (ref 15–41)
Alkaline Phosphatase: 66 U/L (ref 38–126)
BUN: 5 mg/dL — ABNORMAL LOW (ref 6–20)
CHLORIDE: 100 mmol/L — AB (ref 101–111)
CO2: 25 mmol/L (ref 22–32)
Calcium: 8.1 mg/dL — ABNORMAL LOW (ref 8.9–10.3)
Creatinine, Ser: 0.85 mg/dL (ref 0.61–1.24)
GFR calc Af Amer: >60 mL/min (ref >60)
GFR calc non Af Amer: >60 mL/min (ref >60)
GLUCOSE: 127 mg/dL — AB (ref 65–99)
POTASSIUM: 3.2 mmol/L — AB (ref 3.5–5.1)
SODIUM: 130 mmol/L — AB (ref 135–145)
TOTAL PROTEIN: 5.6 g/dL — AB (ref 6.5–8.1)
Total Bilirubin: 0.9 mg/dL (ref 0.3–1.2)

## 2016-11-21 LAB — CBC WITH DIFFERENTIAL/PLATELET
BASOS ABS: 0 10*3/uL (ref 0.0–0.1)
Basophils Relative: 0 %
Eosinophils Absolute: 0.1 10*3/uL (ref 0.0–0.7)
Eosinophils Relative: 1 %
HEMATOCRIT: 32.3 % — AB (ref 39.0–52.0)
Hemoglobin: 10.6 g/dL — ABNORMAL LOW (ref 13.0–17.0)
LYMPHS ABS: 0.5 10*3/uL — AB (ref 0.7–4.0)
LYMPHS PCT: 4 %
MCH: 31 pg (ref 26.0–34.0)
MCHC: 32.8 g/dL (ref 30.0–36.0)
MCV: 94.4 fL (ref 78.0–100.0)
MONO ABS: 1 10*3/uL (ref 0.1–1.0)
MONOS PCT: 9 %
NEUTROS ABS: 9.5 10*3/uL — AB (ref 1.7–7.7)
Neutrophils Relative %: 86 %
Platelets: 140 10*3/uL — ABNORMAL LOW (ref 150–400)
RBC: 3.42 MIL/uL — ABNORMAL LOW (ref 4.22–5.81)
RDW: 15.2 % (ref 11.5–15.5)
WBC: 11.1 10*3/uL — ABNORMAL HIGH (ref 4.0–10.5)

## 2016-11-21 LAB — IRON AND TIBC
Iron: 17 ug/dL — ABNORMAL LOW (ref 45–182)
SATURATION RATIOS: 8 % — AB (ref 17.9–39.5)
TIBC: 216 ug/dL — AB (ref 250–450)
UIBC: 199 ug/dL

## 2016-11-21 LAB — FERRITIN: Ferritin: 131 ng/mL (ref 24–336)

## 2016-11-21 LAB — RETICULOCYTES
RBC.: 3.42 MIL/uL — ABNORMAL LOW (ref 4.22–5.81)
RETIC CT PCT: 2 % (ref 0.4–3.1)
Retic Count, Absolute: 68.4 10*3/uL (ref 19.0–186.0)

## 2016-11-21 LAB — MRSA PCR SCREENING: MRSA BY PCR: NEGATIVE

## 2016-11-21 LAB — T4, FREE: FREE T4: 0.52 ng/dL — AB (ref 0.61–1.12)

## 2016-11-21 LAB — TSH: TSH: 13.101 u[IU]/mL — ABNORMAL HIGH (ref 0.350–4.500)

## 2016-11-21 LAB — SEDIMENTATION RATE: Sed Rate: 108 mm/hr — ABNORMAL HIGH (ref 0–16)

## 2016-11-21 LAB — HIV ANTIBODY (ROUTINE TESTING W REFLEX): HIV Screen 4th Generation wRfx: NONREACTIVE

## 2016-11-21 LAB — BRAIN NATRIURETIC PEPTIDE: B Natriuretic Peptide: 174.7 pg/mL — ABNORMAL HIGH (ref 0.0–100.0)

## 2016-11-21 LAB — FOLATE: Folate: 16 ng/mL (ref >5.9)

## 2016-11-21 LAB — VITAMIN B12: VITAMIN B 12: 250 pg/mL (ref 180–914)

## 2016-11-21 SURGERY — WOUND EXPLORATION
Anesthesia: General

## 2016-11-21 MED ORDER — SODIUM CHLORIDE 0.9 % IR SOLN
Status: DC | PRN
  Administered 2016-11-21: 17:00:00

## 2016-11-21 MED ORDER — SENNOSIDES-DOCUSATE SODIUM 8.6-50 MG PO TABS: 1.0000 | ORAL_TABLET | Freq: Every evening | ORAL | Status: DC | PRN

## 2016-11-21 MED ORDER — POLYETHYLENE GLYCOL 3350 17 G PO PACK
17.0000 g | PACK | Freq: Every day | ORAL | Status: DC
  Administered 2016-11-23: 17 g via ORAL
  Filled 2016-11-21 (×3): qty 1

## 2016-11-21 MED ORDER — VANCOMYCIN HCL IN DEXTROSE 1-5 GM/200ML-% IV SOLN
1000.0000 mg | Freq: Three times a day (TID) | INTRAVENOUS | Status: DC
  Administered 2016-11-21 (×3): 1000 mg via INTRAVENOUS
  Filled 2016-11-21 (×5): qty 200

## 2016-11-21 MED ORDER — LACTATED RINGERS IV SOLN
INTRAVENOUS | Status: DC
  Administered 2016-11-21: 16:00:00 via INTRAVENOUS

## 2016-11-21 MED ORDER — POTASSIUM CHLORIDE CRYS ER 20 MEQ PO TBCR
40.0000 meq | EXTENDED_RELEASE_TABLET | Freq: Once | ORAL | Status: AC
  Administered 2016-11-21: 40 meq via ORAL
  Filled 2016-11-21: qty 2

## 2016-11-21 MED ORDER — ONDANSETRON HCL 4 MG/2ML IJ SOLN
INTRAMUSCULAR | Status: AC
  Filled 2016-11-21: qty 2

## 2016-11-21 MED ORDER — FENTANYL CITRATE (PF) 100 MCG/2ML IJ SOLN
INTRAMUSCULAR | Status: DC | PRN
  Administered 2016-11-21 (×2): 50 ug via INTRAVENOUS
  Administered 2016-11-21: 100 ug via INTRAVENOUS

## 2016-11-21 MED ORDER — VANCOMYCIN HCL 1000 MG IV SOLR
INTRAVENOUS | Status: DC | PRN
  Administered 2016-11-21: 1000 mg via TOPICAL

## 2016-11-21 MED ORDER — MIDAZOLAM HCL 5 MG/5ML IJ SOLN
INTRAMUSCULAR | Status: DC | PRN
  Administered 2016-11-21 (×2): 1 mg via INTRAVENOUS

## 2016-11-21 MED ORDER — FENTANYL CITRATE (PF) 250 MCG/5ML IJ SOLN
INTRAMUSCULAR | Status: AC
  Filled 2016-11-21: qty 5

## 2016-11-21 MED ORDER — LIDOCAINE 2% (20 MG/ML) 5 ML SYRINGE
INTRAMUSCULAR | Status: DC | PRN
  Administered 2016-11-21: 20 mg via INTRAVENOUS

## 2016-11-21 MED ORDER — VANCOMYCIN HCL 1000 MG IV SOLR
INTRAVENOUS | Status: AC
  Filled 2016-11-21: qty 1000

## 2016-11-21 MED ORDER — MIDAZOLAM HCL 2 MG/2ML IJ SOLN
INTRAMUSCULAR | Status: AC
  Filled 2016-11-21: qty 2

## 2016-11-21 MED ORDER — LACTATED RINGERS IV SOLN
INTRAVENOUS | Status: DC | PRN
Start: 2016-11-21 — End: 2016-11-21
  Administered 2016-11-21: 15:00:00 via INTRAVENOUS

## 2016-11-21 MED ORDER — 0.9 % SODIUM CHLORIDE (POUR BTL) OPTIME
TOPICAL | Status: DC | PRN
  Administered 2016-11-21: 1000 mL

## 2016-11-21 MED ORDER — ROCURONIUM BROMIDE 10 MG/ML (PF) SYRINGE
PREFILLED_SYRINGE | INTRAVENOUS | Status: DC | PRN
  Administered 2016-11-21: 50 mg via INTRAVENOUS

## 2016-11-21 MED ORDER — PHENYLEPHRINE 40 MCG/ML (10ML) SYRINGE FOR IV PUSH (FOR BLOOD PRESSURE SUPPORT)
PREFILLED_SYRINGE | INTRAVENOUS | Status: DC | PRN
  Administered 2016-11-21: 80 ug via INTRAVENOUS
  Administered 2016-11-21 (×2): 120 ug via INTRAVENOUS
  Administered 2016-11-21: 80 ug via INTRAVENOUS

## 2016-11-21 MED ORDER — DEXAMETHASONE SODIUM PHOSPHATE 10 MG/ML IJ SOLN
INTRAMUSCULAR | Status: AC
  Filled 2016-11-21: qty 1

## 2016-11-21 MED ORDER — PROPOFOL 10 MG/ML IV BOLUS
INTRAVENOUS | Status: AC
  Filled 2016-11-21: qty 20

## 2016-11-21 MED ORDER — PHENYLEPHRINE 40 MCG/ML (10ML) SYRINGE FOR IV PUSH (FOR BLOOD PRESSURE SUPPORT)
PREFILLED_SYRINGE | INTRAVENOUS | Status: AC
  Filled 2016-11-21: qty 10

## 2016-11-21 MED ORDER — SUFENTANIL CITRATE 50 MCG/ML IV SOLN
INTRAVENOUS | Status: AC
  Filled 2016-11-21: qty 1

## 2016-11-21 MED ORDER — ONDANSETRON HCL 4 MG PO TABS: 4.0000 mg | ORAL_TABLET | Freq: Four times a day (QID) | ORAL | Status: DC | PRN

## 2016-11-21 MED ORDER — THROMBIN 5000 UNITS EX SOLR
CUTANEOUS | Status: AC
  Filled 2016-11-21: qty 15000

## 2016-11-21 MED ORDER — PROPOFOL 10 MG/ML IV BOLUS
INTRAVENOUS | Status: DC | PRN
  Administered 2016-11-21: 30 mg via INTRAVENOUS
  Administered 2016-11-21: 120 mg via INTRAVENOUS
  Administered 2016-11-21: 25 mg via INTRAVENOUS

## 2016-11-21 MED ORDER — MORPHINE SULFATE (PF) 2 MG/ML IV SOLN
1.0000 mg | INTRAVENOUS | Status: DC | PRN
  Administered 2016-11-21 – 2016-11-22 (×7): 1 mg via INTRAVENOUS
  Filled 2016-11-21 (×7): qty 1

## 2016-11-21 MED ORDER — HYDROMORPHONE HCL 1 MG/ML IJ SOLN
INTRAMUSCULAR | Status: AC
  Filled 2016-11-21: qty 1

## 2016-11-21 MED ORDER — NICOTINE 14 MG/24HR TD PT24
14.0000 mg | MEDICATED_PATCH | Freq: Every day | TRANSDERMAL | Status: DC
  Administered 2016-11-21 – 2016-11-25 (×5): 14 mg via TRANSDERMAL
  Filled 2016-11-21 (×5): qty 1

## 2016-11-21 MED ORDER — MIDAZOLAM HCL 2 MG/2ML IJ SOLN: 0.5000 mg | Freq: Once | INTRAMUSCULAR | Status: DC | PRN

## 2016-11-21 MED ORDER — PHENYLEPHRINE HCL 10 MG/ML IJ SOLN
INTRAVENOUS | Status: DC | PRN
  Administered 2016-11-21: 20 ug/min via INTRAVENOUS

## 2016-11-21 MED ORDER — LEVOTHYROXINE SODIUM 112 MCG PO TABS
112.0000 ug | ORAL_TABLET | Freq: Every day | ORAL | Status: DC
  Administered 2016-11-21 – 2016-11-25 (×5): 112 ug via ORAL
  Filled 2016-11-21 (×5): qty 1

## 2016-11-21 MED ORDER — VITAMIN B-1 100 MG PO TABS
100.0000 mg | ORAL_TABLET | Freq: Every day | ORAL | Status: DC
  Administered 2016-11-21 – 2016-11-25 (×5): 100 mg via ORAL
  Filled 2016-11-21 (×5): qty 1

## 2016-11-21 MED ORDER — BUPIVACAINE HCL (PF) 0.25 % IJ SOLN
INTRAMUSCULAR | Status: AC
Start: 2016-11-21 — End: 2016-11-21
  Filled 2016-11-21: qty 30

## 2016-11-21 MED ORDER — SUGAMMADEX SODIUM 200 MG/2ML IV SOLN
INTRAVENOUS | Status: AC
  Filled 2016-11-21: qty 2

## 2016-11-21 MED ORDER — SODIUM CHLORIDE 0.9 % IV SOLN
INTRAVENOUS | Status: AC
  Administered 2016-11-21 (×2): via INTRAVENOUS

## 2016-11-21 MED ORDER — FOLIC ACID 1 MG PO TABS
1.0000 mg | ORAL_TABLET | Freq: Every day | ORAL | Status: DC
  Administered 2016-11-21 – 2016-11-25 (×5): 1 mg via ORAL
  Filled 2016-11-21 (×5): qty 1

## 2016-11-21 MED ORDER — MEPERIDINE HCL 25 MG/ML IJ SOLN: 6.2500 mg | INTRAMUSCULAR | Status: DC | PRN

## 2016-11-21 MED ORDER — ONDANSETRON HCL 4 MG/2ML IJ SOLN
INTRAMUSCULAR | Status: DC | PRN
  Administered 2016-11-21: 4 mg via INTRAVENOUS

## 2016-11-21 MED ORDER — LIDOCAINE 2% (20 MG/ML) 5 ML SYRINGE
INTRAMUSCULAR | Status: AC
  Filled 2016-11-21: qty 5

## 2016-11-21 MED ORDER — HYDROMORPHONE HCL 1 MG/ML IJ SOLN
0.2500 mg | INTRAMUSCULAR | Status: DC | PRN
  Administered 2016-11-21 (×2): 0.5 mg via INTRAVENOUS

## 2016-11-21 MED ORDER — METHOCARBAMOL 1000 MG/10ML IJ SOLN: 500.0000 mg | Freq: Four times a day (QID) | INTRAVENOUS | Status: DC | PRN

## 2016-11-21 MED ORDER — DEXAMETHASONE SODIUM PHOSPHATE 10 MG/ML IJ SOLN
INTRAMUSCULAR | Status: DC | PRN
  Administered 2016-11-21: 10 mg via INTRAVENOUS

## 2016-11-21 MED ORDER — ONDANSETRON HCL 4 MG/2ML IJ SOLN: 4.0000 mg | Freq: Four times a day (QID) | INTRAMUSCULAR | Status: DC | PRN

## 2016-11-21 MED ORDER — PROMETHAZINE HCL 25 MG/ML IJ SOLN: 6.2500 mg | INTRAMUSCULAR | Status: DC | PRN

## 2016-11-21 MED ORDER — RIFAXIMIN 550 MG PO TABS
550.0000 mg | ORAL_TABLET | Freq: Two times a day (BID) | ORAL | Status: DC
  Administered 2016-11-21 – 2016-11-25 (×9): 550 mg via ORAL
  Filled 2016-11-21 (×9): qty 1

## 2016-11-21 MED ORDER — ROCURONIUM BROMIDE 10 MG/ML (PF) SYRINGE
PREFILLED_SYRINGE | INTRAVENOUS | Status: AC
  Filled 2016-11-21: qty 5

## 2016-11-21 SURGICAL SUPPLY — 47 items
BAG DECANTER FOR FLEXI CONT (MISCELLANEOUS) ×3 IMPLANT
BENZOIN TINCTURE PRP APPL 2/3 (GAUZE/BANDAGES/DRESSINGS) ×3 IMPLANT
BLADE CLIPPER SURG (BLADE) IMPLANT
CANISTER SUCT 3000ML PPV (MISCELLANEOUS) ×3 IMPLANT
CARTRIDGE OIL MAESTRO DRILL (MISCELLANEOUS) ×1 IMPLANT
CLOSURE WOUND 1/2 X4 (GAUZE/BANDAGES/DRESSINGS) ×1
DERMABOND ADVANCED (GAUZE/BANDAGES/DRESSINGS) ×2
DERMABOND ADVANCED .7 DNX12 (GAUZE/BANDAGES/DRESSINGS) ×1 IMPLANT
DIFFUSER DRILL AIR PNEUMATIC (MISCELLANEOUS) ×3 IMPLANT
DRAPE LAPAROTOMY 100X72X124 (DRAPES) ×3 IMPLANT
DRAPE POUCH INSTRU U-SHP 10X18 (DRAPES) ×3 IMPLANT
DRAPE SURG 17X23 STRL (DRAPES) IMPLANT
DRSG OPSITE POSTOP 4X6 (GAUZE/BANDAGES/DRESSINGS) ×3 IMPLANT
ELECT REM PT RETURN 9FT ADLT (ELECTROSURGICAL) ×3
ELECTRODE REM PT RTRN 9FT ADLT (ELECTROSURGICAL) ×1 IMPLANT
EVACUATOR 3/16  PVC DRAIN (DRAIN) ×2
EVACUATOR 3/16 PVC DRAIN (DRAIN) ×1 IMPLANT
GAUZE SPONGE 4X4 12PLY STRL (GAUZE/BANDAGES/DRESSINGS) ×3 IMPLANT
GAUZE SPONGE 4X4 16PLY XRAY LF (GAUZE/BANDAGES/DRESSINGS) IMPLANT
GLOVE BIOGEL PI IND STRL 7.5 (GLOVE) ×1 IMPLANT
GLOVE BIOGEL PI IND STRL 8 (GLOVE) ×1 IMPLANT
GLOVE BIOGEL PI INDICATOR 7.5 (GLOVE) ×2
GLOVE BIOGEL PI INDICATOR 8 (GLOVE) ×2
GLOVE ECLIPSE 7.5 STRL STRAW (GLOVE) ×3 IMPLANT
GLOVE ECLIPSE 9.0 STRL (GLOVE) ×3 IMPLANT
GOWN STRL REUS W/ TWL LRG LVL3 (GOWN DISPOSABLE) ×1 IMPLANT
GOWN STRL REUS W/ TWL XL LVL3 (GOWN DISPOSABLE) ×1 IMPLANT
GOWN STRL REUS W/TWL 2XL LVL3 (GOWN DISPOSABLE) ×3 IMPLANT
GOWN STRL REUS W/TWL LRG LVL3 (GOWN DISPOSABLE) ×2
GOWN STRL REUS W/TWL XL LVL3 (GOWN DISPOSABLE) ×2
KIT BASIN OR (CUSTOM PROCEDURE TRAY) ×3 IMPLANT
KIT ROOM TURNOVER OR (KITS) ×3 IMPLANT
NEEDLE HYPO 25X1 1.5 SAFETY (NEEDLE) IMPLANT
NS IRRIG 1000ML POUR BTL (IV SOLUTION) ×3 IMPLANT
OIL CARTRIDGE MAESTRO DRILL (MISCELLANEOUS) ×3
PACK LAMINECTOMY NEURO (CUSTOM PROCEDURE TRAY) ×3 IMPLANT
SPONGE SURGIFOAM ABS GEL SZ50 (HEMOSTASIS) ×3 IMPLANT
STRIP CLOSURE SKIN 1/2X4 (GAUZE/BANDAGES/DRESSINGS) ×2 IMPLANT
SUT ETHILON 2 0 PSLX (SUTURE) ×3 IMPLANT
SUT VIC AB 0 CT1 18XCR BRD8 (SUTURE) ×1 IMPLANT
SUT VIC AB 0 CT1 8-18 (SUTURE) ×2
SUT VIC AB 2-0 CT1 18 (SUTURE) ×3 IMPLANT
SWAB COLLECTION DEVICE MRSA (MISCELLANEOUS) ×3 IMPLANT
SWAB CULTURE ESWAB REG 1ML (MISCELLANEOUS) ×3 IMPLANT
TOWEL GREEN STERILE (TOWEL DISPOSABLE) ×3 IMPLANT
TOWEL GREEN STERILE FF (TOWEL DISPOSABLE) ×3 IMPLANT
WATER STERILE IRR 1000ML POUR (IV SOLUTION) ×3 IMPLANT
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
## 2016-11-21 NOTE — Progress Notes (Signed)
PT Cancellation Note  Patient Details Name: [name] MRN: [id number] DOB: 10/15/1960   Cancelled Treatment:    Reason Eval/Treat Not Completed: Medical issues which prohibited therapy. Pt will going in for a surgical procedure. Will hold PT until after surgery.    Scheryl Marten PT, DPT  [phone]  11/21/2016, 1:27 PM
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
## 2016-11-21 NOTE — Anesthesia Procedure Notes (Signed)
Procedure Name: Intubation Date/Time: 11/21/2016 4:18 PM Performed by: G[place] T Pre-anesthesia Checklist: Patient identified, Emergency Drugs available, Suction available and Patient being monitored Patient Re-evaluated:Patient Re-evaluated prior to induction Oxygen Delivery Method: Circle System Utilized Preoxygenation: Pre-oxygenation with 100% oxygen Induction Type: IV induction Ventilation: Mask ventilation without difficulty Laryngoscope Size: Miller and 2 Grade View: Grade I Tube type: Oral Tube size: 8.0 mm Number of attempts: 1 Airway Equipment and Method: Stylet and Oral airway Placement Confirmation: ETT inserted through vocal cords under direct vision,  positive ETCO2 and breath sounds checked- equal and bilateral Secured at: 23 cm Tube secured with: Tape Dental Injury: Teeth and Oropharynx as per pre-operative assessment

## 2016-11-21 NOTE — Transfer of Care (Signed)
Immediate Anesthesia Transfer of Care Note  Patient: [name]  Procedure(s) Performed: Procedure(s): WOUND EXPLORATION (N/A)  Patient Location: PACU  Anesthesia Type:General  Level of Consciousness: awake, alert , oriented and patient cooperative  Airway & Oxygen Therapy: Patient Spontanous Breathing and Patient connected to nasal cannula oxygen  Post-op Assessment: Report given to RN, Post -op Vital signs reviewed and stable, Patient moving all extremities and Patient moving all extremities X 4  Post vital signs: Reviewed and stable  Last Vitals:  Vitals:   11/21/16 0411  BP: (!) 114/56  Pulse: 63  Resp: 18  Temp: 36.9 C    Last Pain:  Vitals:   11/21/16 1333  TempSrc:   PainSc: 3       Patients Stated Pain Goal: 0 (95/39/67 2897)  Complications: No apparent anesthesia complications

## 2016-11-21 NOTE — Anesthesia Preprocedure Evaluation (Addendum)
Anesthesia Evaluation  Patient identified by MRN, date of birth, ID band Patient awake    Reviewed: Allergy & Precautions, NPO status , Patient's Chart, lab work & pertinent test results  History of Anesthesia Complications Negative for: history of anesthetic complications  Airway Mallampati: II  TM Distance: >3 FB Neck ROM: Full    Dental  (+) Edentulous Upper, Edentulous Lower   Pulmonary COPD, Current Smoker,    breath sounds clear to auscultation       Cardiovascular hypertension (no meds),  Rhythm:Regular Rate:Normal  '11 ECHO: EF 55-60%, valves OK   Neuro/Psych Seizures -,  Anxiety Depression    GI/Hepatic negative GI ROS, (+)     substance abuse  alcohol use,   Endo/Other  Hypothyroidism Morbid obesity  Renal/GU negative Renal ROS     Musculoskeletal   Abdominal (+) + obese,   Peds  Hematology Hb 10.6   Anesthesia Other Findings Prostate cancer  Reproductive/Obstetrics                            Anesthesia Physical Anesthesia Plan  ASA: III  Anesthesia Plan: General   Post-op Pain Management:    Induction: Intravenous  PONV Risk Score and Plan: 2 and Ondansetron and Dexamethasone  Airway Management Planned: Oral ETT  Additional Equipment:   Intra-op Plan:   Post-operative Plan: Extubation in OR  Informed Consent: I have reviewed the patients History and Physical, chart, labs and discussed the procedure including the risks, benefits and alternatives for the proposed anesthesia with the patient or authorized representative who has indicated his/her understanding and acceptance.     Plan Discussed with: CRNA and Surgeon  Anesthesia Plan Comments: (Plan routine monitors, GETA)        Anesthesia Quick Evaluation
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
## 2016-11-21 NOTE — Brief Op Note (Signed)
11/21/2016  5:15 PM  PATIENT:  [name]  56 y.o. male  PRE-OPERATIVE DIAGNOSIS:  Thoracic and lumbar wound infection  POST-OPERATIVE DIAGNOSIS:  Thoracic and lumbar wound infection  PROCEDURE:  Procedure(s): WOUND EXPLORATION (N/A)  SURGEON:  Surgeon(s) and Role:    Earnie Larsson, MD - Primary  PHYSICIAN ASSISTANT:   ASSISTANTS:    ANESTHESIA:   general  EBL:  Total I/O In: 679.2 [I.V.:479.2; IV Piggyback:200] Out: 920 [Urine:900; Blood:20]  BLOOD ADMINISTERED:none  DRAINS: (med large) Hemovact drain(s) in the epidural space with  Suction Open   LOCAL MEDICATIONS USED:  MARCAINE     SPECIMEN:  No Specimen  DISPOSITION OF SPECIMEN:  N/A  COUNTS:  YES  TOURNIQUET:  * No tourniquets in log *  DICTATION: .Dragon Dictation  PLAN OF CARE: Admit to inpatient   PATIENT DISPOSITION:  PACU - hemodynamically stable.   Delay start of Pharmacological VTE agent (>24hrs) due to surgical blood loss or risk of bleeding: yes

## 2016-11-21 NOTE — Progress Notes (Signed)
Pharmacy Antibiotic Note  [name] is a 56 y.o. male admitted on 11/21/2016 with surgical-site infection w/ concern for osteomyelitis.  Pharmacy has been consulted for vancomycin dosing.  Labs from OSH: WBC 12.7, Hgb 11.1, Plt 156, Na 129, K 3.4, SCr 0.7, INR 1.1, LA 1.0  Plan: Vanc 2g given at OSH 7/26 @2215  Vancomycin 1000mg  IV every 8 hours.  Goal trough 15-20 mcg/mL.  Height: 5\' 11"  (180.3 cm) Weight: 216 lb 4.8 oz (98.1 kg) IBW/kg (Calculated) : 75.3  Temp (24hrs), Avg:98.5 F (36.9 C), Min:98.5 F (36.9 C), Max:98.5 F (36.9 C)   Allergies  Allergen Reactions  . Hydrocodone Nausea Only  . Acetaminophen Other (See Comments)    <2 g/day due to cirrhosis of liver  . Amitriptyline Swelling    Swelling in legs  . Hydrocodone Nausea And Vomiting  . Motrin [Ibuprofen] Other (See Comments)    Cannot take due to cirrhosis of liver  . Motrin [Ibuprofen]     Avoids due to liver cirrhosis.  . Tylenol [Acetaminophen] Other (See Comments)    <2g / day due to cirrhosis    Thank you for allowing pharmacy to be a part of this patient's care.  Wynona Neat, PharmD, BCPS  11/21/2016 6:38 AM

## 2016-11-21 NOTE — Progress Notes (Signed)
Orthopedic Tech Progress Note Patient Details:  [name] [birth year] [id number] Called [employer] for brace order. Patient ID: [name], male   DOB: Apr 24, 1961, 56 y.o.   MRN: [id number]   Braulio Bosch 11/21/2016, 8:05 PM

## 2016-11-22 DIAGNOSIS — T8149XA Infection following a procedure, other surgical site, initial encounter: Secondary | ICD-10-CM | POA: Diagnosis present

## 2016-11-22 DIAGNOSIS — R7881 Bacteremia: Secondary | ICD-10-CM

## 2016-11-22 DIAGNOSIS — B9561 Methicillin susceptible Staphylococcus aureus infection as the cause of diseases classified elsewhere: Secondary | ICD-10-CM | POA: Diagnosis present

## 2016-11-22 DIAGNOSIS — T814XXD Infection following a procedure, subsequent encounter: Secondary | ICD-10-CM

## 2016-11-22 DIAGNOSIS — D649 Anemia, unspecified: Secondary | ICD-10-CM

## 2016-11-22 LAB — BASIC METABOLIC PANEL
ANION GAP: 6 (ref 5–15)
BUN: 7 mg/dL (ref 6–20)
CHLORIDE: 104 mmol/L (ref 101–111)
CO2: 24 mmol/L (ref 22–32)
Calcium: 8.4 mg/dL — ABNORMAL LOW (ref 8.9–10.3)
Creatinine, Ser: 0.74 mg/dL (ref 0.61–1.24)
GFR calc Af Amer: >60 mL/min (ref >60)
GLUCOSE: 150 mg/dL — AB (ref 65–99)
POTASSIUM: 4 mmol/L (ref 3.5–5.1)
Sodium: 134 mmol/L — ABNORMAL LOW (ref 135–145)

## 2016-11-22 LAB — CBC
HEMATOCRIT: 34.4 % — AB (ref 39.0–52.0)
Hemoglobin: 11.6 g/dL — ABNORMAL LOW (ref 13.0–17.0)
MCH: 32 pg (ref 26.0–34.0)
MCHC: 33.7 g/dL (ref 30.0–36.0)
MCV: 95 fL (ref 78.0–100.0)
Platelets: 166 10*3/uL (ref 150–400)
RBC: 3.62 MIL/uL — ABNORMAL LOW (ref 4.22–5.81)
RDW: 15 % (ref 11.5–15.5)
WBC: 12.2 10*3/uL — AB (ref 4.0–10.5)

## 2016-11-22 LAB — VANCOMYCIN, TROUGH: VANCOMYCIN TR: 13 ug/mL — AB (ref 15–20)

## 2016-11-22 MED ORDER — ZOLPIDEM TARTRATE 5 MG PO TABS
5.0000 mg | ORAL_TABLET | Freq: Every evening | ORAL | Status: DC | PRN
  Administered 2016-11-23 – 2016-11-25 (×2): 5 mg via ORAL
  Filled 2016-11-22 (×2): qty 1

## 2016-11-22 MED ORDER — MORPHINE SULFATE 10 MG/5ML PO SOLN
5.0000 mg | ORAL | Status: DC | PRN
  Administered 2016-11-22 (×2): 5 mg via ORAL
  Filled 2016-11-22 (×4): qty 4

## 2016-11-22 MED ORDER — MORPHINE SULFATE (PF) 2 MG/ML IV SOLN
1.0000 mg | INTRAVENOUS | Status: DC | PRN
  Administered 2016-11-22 – 2016-11-24 (×12): 4 mg via INTRAVENOUS
  Administered 2016-11-25 (×2): 2 mg via INTRAVENOUS
  Administered 2016-11-25: 4 mg via INTRAVENOUS
  Filled 2016-11-22 (×2): qty 2
  Filled 2016-11-22: qty 1
  Filled 2016-11-22 (×9): qty 2
  Filled 2016-11-22: qty 1
  Filled 2016-11-22 (×2): qty 2

## 2016-11-22 MED ORDER — MORPHINE SULFATE (PF) 2 MG/ML IV SOLN
2.0000 mg | Freq: Once | INTRAVENOUS | Status: AC
  Administered 2016-11-22: 2 mg via INTRAVENOUS
  Filled 2016-11-22: qty 1

## 2016-11-22 MED ORDER — PIPERACILLIN-TAZOBACTAM 3.375 G IVPB
3.3750 g | Freq: Three times a day (TID) | INTRAVENOUS | Status: DC
  Filled 2016-11-22 (×2): qty 50

## 2016-11-22 MED ORDER — SODIUM CHLORIDE 0.9 % IV SOLN
INTRAVENOUS | Status: AC
  Administered 2016-11-22: 19:00:00 via INTRAVENOUS

## 2016-11-22 MED ORDER — VANCOMYCIN HCL 10 G IV SOLR
1250.0000 mg | Freq: Three times a day (TID) | INTRAVENOUS | Status: DC
  Administered 2016-11-22 – 2016-11-23 (×4): 1250 mg via INTRAVENOUS
  Filled 2016-11-22 (×5): qty 1250

## 2016-11-22 NOTE — Progress Notes (Signed)
Patient ID: [name], male   DOB: 1960-12-14, 56 y.o.   MRN: [id number] Subjective: Patient reports back soreness, no leg pain or NTW  Objective: Vital signs in last 24 hours: Temp:  [97.4 F (36.3 C)-98.8 F (37.1 C)] 97.9 F (36.6 C) (07/28 0620) Pulse Rate:  [59-77] 72 (07/28 0620) Resp:  [13-17] 16 (07/28 0620) BP: (101-139)/(57-71) 139/71 (07/28 0620) SpO2:  [91 %-97 %] 94 % (07/28 0620)  Intake/Output from previous day: 07/27 0701 - 07/28 0700 In: 3049.2 [I.V.:2199.2; IV Piggyback:850] Out: 2878 [Urine:1150; Blood:20] Intake/Output this shift: No intake/output data recorded.  Neurologic: Grossly normal, dressing dry  Lab Results: Lab Results  Component Value Date   WBC 12.2 (H) 11/22/2016   HGB 11.6 (L) 11/22/2016   HCT 34.4 (L) 11/22/2016   MCV 95.0 11/22/2016   PLT 166 11/22/2016   Lab Results  Component Value Date   INR 1.12 05/17/2016   BMET Lab Results  Component Value Date   NA 134 (L) 11/22/2016   K 4.0 11/22/2016   CL 104 11/22/2016   CO2 24 11/22/2016   GLUCOSE 150 (H) 11/22/2016   BUN 7 11/22/2016   CREATININE 0.74 11/22/2016   CALCIUM 8.4 (L) 11/22/2016    Studies/Results: Dg Chest Port 1 View  Result Date: 11/21/2016 CLINICAL DATA:  Shortness of breath.  Back pain. EXAM: PORTABLE CHEST 1 VIEW COMPARISON:  11/20/2016.  05/09/2016.  04/11/2015.  CT 05/10/2016. FINDINGS: Stable cardiomegaly. No pulmonary venous congestion. Stable chronic interstitial prominence and changes of pleural-parenchymal scarring. Prior cervical spine and thoracolumbar fusion. No acute bony abnormality noted on today's exam. IMPRESSION: 1. Stable cardiomegaly. Stable changes of chronic interstitial lung disease and pleuroparenchymal scarring. No acute abnormality identified. 2. Cervicothoracic and thoracolumbar spine fusion. No definite rib fractures noted on today's exam . Electronically Signed   By: Marcello Moores  Register   On: 11/21/2016 07:19     Assessment/Plan: Wound infection, GS with abundant GPC, on vanco  Will need a picc for 6 wks of IV abx followed by 6 wks oral abx  rec ID consult to manage PICC and abx  mobilize in brace   LOS: 1 day    [place] S 11/22/2016, 9:27 AM

## 2016-11-22 NOTE — Progress Notes (Signed)
Pharmacy Antibiotic Note  [name] is a 56 y.o. male admitted on 11/21/2016 with Osteomyelitis.  Pharmacy has been consulted for Vancomycin dosing.  Vanc trough is below goal of 15-20.  Called last PM with (+)blood cx, BCID non-reactive & must await plate results.  Plan: Change Vancomycin to 1250mg  IV q8 Watch renal function and f/u blood cx results  Height: 5\' 11"  (180.3 cm) Weight: 216 lb 4.8 oz (98.1 kg) IBW/kg (Calculated) : 75.3  Temp (24hrs), Avg:98 F (36.7 C), Min:97.4 F (36.3 C), Max:98.8 F (37.1 C)   Recent Labs Lab 11/21/16 0628 11/22/16 0403  WBC 11.1* 12.2*  CREATININE 0.85 0.74  VANCOTROUGH  --  13*    Estimated Creatinine Clearance: 127.5 mL/min (by C-G formula based on SCr of 0.74 mg/dL).    Allergies  Allergen Reactions  . Hydrocodone Nausea Only  . Acetaminophen Other (See Comments)    <2 g/day due to cirrhosis of liver  . Amitriptyline Swelling and Other (See Comments)    Swelling in legs  . Hydrocodone Nausea And Vomiting  . Motrin [Ibuprofen] Other (See Comments)    Cannot take due to cirrhosis of liver  . Motrin [Ibuprofen] Other (See Comments)    Avoids due to liver cirrhosis.  . Tylenol [Acetaminophen] Other (See Comments)    <2g / day due to cirrhosis    Antimicrobials this admission: 7/25 Vanc >>  Dose adjustments this admission: 7/28  Vanc Trough 13 (on 1000mg  q8)  Microbiology results: 7/27 BCx: (+)GPC-clusters in 1/2 7/27 BCID- non reactive 7/27 Wound: (+)GPC   Thank you for allowing pharmacy to be a part of this patient's care.  Lewie Chamber., PharmD Clinical Pharmacist [facility]

## 2016-11-22 NOTE — Progress Notes (Signed)
Patient still having 10/10 pain in surgical site neurosurgery called. Verbal given for  Morphine 2-4 mg iv q4hprn Morphine 5mg  oral q4hprn [name]
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
## 2016-11-22 NOTE — Progress Notes (Signed)
Pharmacy Antibiotic Note  [name] is a 56 y.o. male admitted on 11/21/2016 with wound infection.  Pharmacy has been consulted for Zosyn dosing.  Plan: Initiate Zosyn 3.375g IV q8h (4 hour infusion).  Monitor clinical progress, cultures and sensitivities, renal function, abx plan    Height: 5\' 11"  (180.3 cm) Weight: 216 lb 4.8 oz (98.1 kg) IBW/kg (Calculated) : 75.3  Temp (24hrs), Avg:98 F (36.7 C), Min:97.4 F (36.3 C), Max:98.8 F (37.1 C)   Recent Labs Lab 11/21/16 0628 11/22/16 0403  WBC 11.1* 12.2*  CREATININE 0.85 0.74  VANCOTROUGH  --  13*    Estimated Creatinine Clearance: 127.5 mL/min (by C-G formula based on SCr of 0.74 mg/dL).    Allergies  Allergen Reactions  . Hydrocodone Nausea Only  . Acetaminophen Other (See Comments)    <2 g/day due to cirrhosis of liver  . Amitriptyline Swelling and Other (See Comments)    Swelling in legs  . Hydrocodone Nausea And Vomiting  . Motrin [Ibuprofen] Other (See Comments)    Cannot take due to cirrhosis of liver  . Motrin [Ibuprofen] Other (See Comments)    Avoids due to liver cirrhosis.  . Tylenol [Acetaminophen] Other (See Comments)    <2g / day due to cirrhosis    Antimicrobials this admission: 7/25 Vanc >> 7/28 Zosyn >>   Dose adjustments this admission: 7/28  Vanc Trough 13 (on 1000 mg q8)-increased dose to 1250 mg q8  Microbiology results: 7/27 BCx: (+)GPC-clusters in 1/2 7/27 BCID- non reactive 7/27 Wound: (+)GPC   Thank you for allowing [name] to participate in this patients care.  Jens Som, PharmD Clinical phone for 11/22/2016 from 7a-3:30p: [phone] If after 3:30p, please call main pharmacy at: [phone] 11/22/2016 9:18 AM

## 2016-11-22 NOTE — Progress Notes (Signed)
PT Cancellation Note  Patient Details Name: [name] MRN: [id number] DOB: 1961/03/14   Cancelled Treatment:    Reason Eval/Treat Not Completed: Pain limiting ability to participate.  Will return tomorrow for PT evaluation.   Despina Pole 11/22/2016, 6:36 PM Carita Pian. Sanjuana Kava, [place] Pager [phone]

## 2016-11-22 NOTE — Progress Notes (Signed)
PHARMACY - PHYSICIAN COMMUNICATION CRITICAL VALUE ALERT - BLOOD CULTURE IDENTIFICATION (BCID)  No results found for this or any previous visit.   Lab called to report #1 of 2 blood cultures with Gm(+) cocci in clusters.  The BCID was non-reactive x 2, so need to wait for culture growth to identify.  Name of physician (or Provider) Contacted: Walden Field, NP  Changes to prescribed antibiotics required: None, continue Vancomycin   Gracy Bruins, B.S., PharmD Clinical Pharmacist [facility]

## 2016-11-23 LAB — CBC
HCT: 30.3 % — ABNORMAL LOW (ref 39.0–52.0)
Hemoglobin: 9.9 g/dL — ABNORMAL LOW (ref 13.0–17.0)
MCH: 30.8 pg (ref 26.0–34.0)
MCHC: 32.7 g/dL (ref 30.0–36.0)
MCV: 94.4 fL (ref 78.0–100.0)
PLATELETS: 187 10*3/uL (ref 150–400)
RBC: 3.21 MIL/uL — AB (ref 4.22–5.81)
RDW: 14.9 % (ref 11.5–15.5)
WBC: 10.6 10*3/uL — AB (ref 4.0–10.5)

## 2016-11-23 LAB — BASIC METABOLIC PANEL
Anion gap: 6 (ref 5–15)
BUN: 12 mg/dL (ref 6–20)
CALCIUM: 8.2 mg/dL — AB (ref 8.9–10.3)
CHLORIDE: 105 mmol/L (ref 101–111)
CO2: 26 mmol/L (ref 22–32)
CREATININE: 0.71 mg/dL (ref 0.61–1.24)
Glucose, Bld: 123 mg/dL — ABNORMAL HIGH (ref 65–99)
Potassium: 3.7 mmol/L (ref 3.5–5.1)
SODIUM: 137 mmol/L (ref 135–145)

## 2016-11-23 LAB — CULTURE, BLOOD (ROUTINE X 2): Special Requests: ADEQUATE

## 2016-11-23 LAB — AEROBIC CULTURE W GRAM STAIN (SUPERFICIAL SPECIMEN)

## 2016-11-23 LAB — AEROBIC CULTURE  (SUPERFICIAL SPECIMEN)

## 2016-11-23 MED ORDER — SODIUM CHLORIDE 0.9% FLUSH
10.0000 mL | INTRAVENOUS | Status: DC | PRN
  Administered 2016-11-24: 10 mL
  Filled 2016-11-23: qty 40

## 2016-11-23 MED ORDER — DIAZEPAM 5 MG PO TABS
5.0000 mg | ORAL_TABLET | Freq: Four times a day (QID) | ORAL | Status: DC | PRN
  Administered 2016-11-23 (×3): 5 mg via ORAL
  Filled 2016-11-23 (×3): qty 1

## 2016-11-23 MED ORDER — OXYCODONE HCL 5 MG PO TABS
10.0000 mg | ORAL_TABLET | ORAL | Status: DC | PRN
  Administered 2016-11-23 – 2016-11-24 (×7): 10 mg via ORAL
  Filled 2016-11-23 (×7): qty 2

## 2016-11-23 MED ORDER — RIFAMPIN 300 MG PO CAPS
300.0000 mg | ORAL_CAPSULE | Freq: Two times a day (BID) | ORAL | Status: DC
  Administered 2016-11-23 – 2016-11-25 (×5): 300 mg via ORAL
  Filled 2016-11-23 (×6): qty 1

## 2016-11-23 MED ORDER — CEFAZOLIN SODIUM-DEXTROSE 2-4 GM/100ML-% IV SOLN
2.0000 g | Freq: Three times a day (TID) | INTRAVENOUS | Status: DC
  Administered 2016-11-23 – 2016-11-25 (×7): 2 g via INTRAVENOUS
  Filled 2016-11-23 (×9): qty 100
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
## 2016-11-23 NOTE — Progress Notes (Signed)
PHARMACY CONSULT NOTE FOR:  OUTPATIENT  PARENTERAL ANTIBIOTIC THERAPY (OPAT)  Indication: Bacteremia and Osteomyelitis Regimen: Cefazolin (Ancef) 2g IV Q8H End date: 01/04/2017  IV antibiotic discharge orders are pended. To discharging provider:  please sign these orders via discharge navigator,  Select New Orders & click on the button choice - Manage This Unsigned Work.    Rifampin po is NOT pended. Please order on discharge. Regimen: Rifampin 300 mg Q12H End date: 01/04/2017   Thank you for allowing pharmacy to be a part of this patient's care.  Leroy Libman, PharmD Pharmacy Resident Pager: [phone] 11/23/2016, 1:48 PM

## 2016-11-23 NOTE — Progress Notes (Signed)
Peripherally Inserted Central Catheter/Midline Placement  The IV Nurse has discussed with the patient and/or persons authorized to consent for the patient, the purpose of this procedure and the potential benefits and risks involved with this procedure.  The benefits include less needle sticks, lab draws from the catheter, and the patient may be discharged home with the catheter. Risks include, but not limited to, infection, bleeding, blood clot (thrombus formation), and puncture of an artery; nerve damage and irregular heartbeat and possibility to perform a PICC exchange if needed/ordered by physician.  Alternatives to this procedure were also discussed.  B[employer] Power PICC patient education guide, fact sheet on infection prevention and patient information card has been provided to patient /or left at bedside.    PICC/Midline Placement Documentation        Darlyn Read 11/23/2016, 3:41 PM
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
## 2016-11-23 NOTE — Progress Notes (Signed)
Patient ID: [name], male   DOB: [birth year], 56 y.o.   MRN: [id number] Subjective: Patient reports significant back pain with movement. No leg pain. No numbness tingling or weakness.  Objective: Vital signs in last 24 hours: Temp:  [97.8 F (36.6 C)-98.9 F (37.2 C)] 97.8 F (36.6 C) (07/29 0636) Pulse Rate:  [61-79] 61 (07/29 0636) Resp:  [16-18] 18 (07/29 0636) BP: (113-139)/(54-71) 139/71 (07/29 0636) SpO2:  [94 %-96 %] 96 % (07/29 0636)  Intake/Output from previous day: 07/28 0701 - 07/29 0700 In: 772.8 [P.O.:222; I.V.:550.8] Out: 1835 [Urine:1800; Drains:35] Intake/Output this shift: No intake/output data recorded.  Neurologic: Grossly normal, drain in place, good strength and legs, dressing dry  Lab Results: Lab Results  Component Value Date   WBC 10.6 (H) 11/23/2016   HGB 9.9 (L) 11/23/2016   HCT 30.3 (L) 11/23/2016   MCV 94.4 11/23/2016   PLT 187 11/23/2016   Lab Results  Component Value Date   INR 1.12 05/17/2016   BMET Lab Results  Component Value Date   NA 137 11/23/2016   K 3.7 11/23/2016   CL 105 11/23/2016   CO2 26 11/23/2016   GLUCOSE 123 (H) 11/23/2016   BUN 12 11/23/2016   CREATININE 0.71 11/23/2016   CALCIUM 8.2 (L) 11/23/2016    Studies/Results: No results found.  Assessment/Plan: Wound infection - continue antibiotics and await final cultures. Will need a PICC line and at least 6 weeks of IV antibiotic followed by 6 weeks of oral antibiotics. May need blood cultures before placement of PICC line. Mobilize as tolerated in TLSO. Continue pain control. Remove drain tomorrow   LOS: 2 days    Kumari Sculley S 11/23/2016, 8:37 AM

## 2016-11-24 ENCOUNTER — Inpatient Hospital Stay (HOSPITAL_COMMUNITY): Payer: Medicaid Other

## 2016-11-24 DIAGNOSIS — B9561 Methicillin susceptible Staphylococcus aureus infection as the cause of diseases classified elsewhere: Secondary | ICD-10-CM

## 2016-11-24 DIAGNOSIS — Y839 Surgical procedure, unspecified as the cause of abnormal reaction of the patient, or of later complication, without mention of misadventure at the time of the procedure: Secondary | ICD-10-CM

## 2016-11-24 DIAGNOSIS — I340 Nonrheumatic mitral (valve) insufficiency: Secondary | ICD-10-CM

## 2016-11-24 DIAGNOSIS — T814XXA Infection following a procedure, initial encounter: Principal | ICD-10-CM

## 2016-11-24 LAB — ECHOCARDIOGRAM COMPLETE BUBBLE STUDY
Ao-asc: 25 cm
E decel time: 204 msec
FS: 26 % — AB (ref 28–44)
IVS/LV PW RATIO, ED: 0.83
LA diam end sys: 49 mm
LA diam index: 2.19 cm/m2
LA vol: 66 mL
LASIZE: 49 mm
LAVOLA4C: 61.8 mL
LAVOLIN: 29.5 mL/m2
LDCA: 3.46 cm2
LV PW d: 12 mm — AB (ref 0.6–1.1)
LV TDI E'MEDIAL: 5.55
LV e' LATERAL: 8.27 cm/s
LVOTD: 21 mm
MV Dec: 204
MV pk E vel: 1.1 m/s
P 1/2 time: 690 ms
PISA EROA: 0.08 cm2
RV LATERAL S' VELOCITY: 13.3 cm/s
RV TAPSE: 32 mm
TDI e' lateral: 8.27
VTI: 205 cm

## 2016-11-24 LAB — CULTURE, BLOOD (ROUTINE X 2): SPECIAL REQUESTS: ADEQUATE

## 2016-11-24 MED ORDER — MORPHINE SULFATE 10 MG/5ML PO SOLN: 5.0000 mg | ORAL | Status: DC | PRN

## 2016-11-24 MED ORDER — OXYCODONE HCL 5 MG PO TABS
10.0000 mg | ORAL_TABLET | ORAL | Status: DC | PRN
  Administered 2016-11-24 – 2016-11-25 (×5): 10 mg via ORAL
  Filled 2016-11-24 (×5): qty 2

## 2016-11-24 NOTE — Progress Notes (Signed)
[facility]   Reason for visit: Follow up on MSSA bacteremia  Interval History: repeat blood cultures drawn yesterday and ngtd.  Picc line placed.  No fever, no chills;    Physical Exam: Constitutional:  Vitals:   11/23/16 2102 11/24/16 0528  BP: (!) 144/69 (!) 144/59  Pulse: (!) 59 (!) 57  Resp: 18 18  Temp: 98.9 F (37.2 C) 98.8 F (37.1 C)   patient appears in NAD HENT: no thrush Respiratory: Normal respiratory effort; CTA B Cardiovascular: RRR   Review of Systems: Constitutional: negative for fevers and chills Gastrointestinal: negative for diarrhea Integument/breast: negative for rash  Lab Results  Component Value Date   WBC 10.6 (H) 11/23/2016   HGB 9.9 (L) 11/23/2016   HCT 30.3 (L) 11/23/2016   MCV 94.4 11/23/2016   PLT 187 11/23/2016    Lab Results  Component Value Date   CREATININE 0.71 11/23/2016   BUN 12 11/23/2016   NA 137 11/23/2016   K 3.7 11/23/2016   CL 105 11/23/2016   CO2 26 11/23/2016    Lab Results  Component Value Date   ALT 10 (L) 11/21/2016   AST 15 11/21/2016   ALKPHOS 66 11/21/2016     Microbiology: Recent Results (from the past 240 hour(s))  Culture, blood (routine x 2)     Status: Abnormal   Collection Time: 11/21/16  6:20 AM  Result Value Ref Range Status   Specimen Description BLOOD RIGHT ANTECUBITAL  Final   Special Requests IN PEDIATRIC BOTTLE Blood Culture adequate volume  Final   Culture  Setup Time   Final    GRAM POSITIVE COCCI IN CLUSTERS IN PEDIATRIC BOTTLE CRITICAL RESULT CALLED TO, READ BACK BY AND VERIFIED WITH: K HYATT, PHARMD 11/22/16 0025 L CHAMPION    Culture STAPHYLOCOCCUS AUREUS (A)  Final   Report Status 11/23/2016 FINAL  Final   Organism ID, Bacteria STAPHYLOCOCCUS AUREUS  Final      Susceptibility   Staphylococcus aureus - MIC*    CIPROFLOXACIN <=0.5 SENSITIVE Sensitive     ERYTHROMYCIN <=0.25 SENSITIVE Sensitive     GENTAMICIN <=0.5 SENSITIVE Sensitive     OXACILLIN  0.5 SENSITIVE Sensitive     TETRACYCLINE <=1 SENSITIVE Sensitive     VANCOMYCIN <=0.5 SENSITIVE Sensitive     TRIMETH/SULFA <=10 SENSITIVE Sensitive     CLINDAMYCIN <=0.25 SENSITIVE Sensitive     RIFAMPIN <=0.5 SENSITIVE Sensitive     Inducible Clindamycin NEGATIVE Sensitive     * STAPHYLOCOCCUS AUREUS  Culture, blood (routine x 2)     Status: Abnormal   Collection Time: 11/21/16  6:30 AM  Result Value Ref Range Status   Specimen Description BLOOD RIGHT ANTECUBITAL  Final   Special Requests   Final    BOTTLES DRAWN AEROBIC AND ANAEROBIC Blood Culture adequate volume   Culture  Setup Time   Final    GRAM POSITIVE COCCI IN CLUSTERS ANAEROBIC BOTTLE ONLY CRITICAL VALUE NOTED.  VALUE IS CONSISTENT WITH PREVIOUSLY REPORTED AND CALLED VALUE.    Culture (A)  Final    STAPHYLOCOCCUS AUREUS SUSCEPTIBILITIES PERFORMED ON PREVIOUS CULTURE WITHIN THE LAST 5 DAYS.    Report Status 11/24/2016 FINAL  Final  MRSA PCR Screening     Status: None   Collection Time: 11/21/16 12:28 PM  Result Value Ref Range Status   MRSA by PCR NEGATIVE NEGATIVE Final    Comment:        The GeneXpert MRSA Assay (FDA approved for  NASAL specimens only), is one component of a comprehensive MRSA colonization surveillance program. It is not intended to diagnose MRSA infection nor to guide or monitor treatment for MRSA infections.   Aerobic Culture (superficial specimen)     Status: None   Collection Time: 11/21/16  4:50 PM  Result Value Ref Range Status   Specimen Description WOUND  Final   Special Requests LUMBAR  Final   Gram Stain   Final    ABUNDANT WBC PRESENT, PREDOMINANTLY PMN ABUNDANT GRAM POSITIVE COCCI    Culture ABUNDANT STAPHYLOCOCCUS AUREUS  Final   Report Status 11/23/2016 FINAL  Final   Organism ID, Bacteria STAPHYLOCOCCUS AUREUS  Final      Susceptibility   Staphylococcus aureus - MIC*    CIPROFLOXACIN <=0.5 SENSITIVE Sensitive     ERYTHROMYCIN <=0.25 SENSITIVE Sensitive     GENTAMICIN  <=0.5 SENSITIVE Sensitive     OXACILLIN <=0.25 SENSITIVE Sensitive     TETRACYCLINE <=1 SENSITIVE Sensitive     VANCOMYCIN <=0.5 SENSITIVE Sensitive     TRIMETH/SULFA <=10 SENSITIVE Sensitive     CLINDAMYCIN <=0.25 SENSITIVE Sensitive     RIFAMPIN <=0.5 SENSITIVE Sensitive     Inducible Clindamycin NEGATIVE Sensitive     * ABUNDANT STAPHYLOCOCCUS AUREUS  Culture, blood (Routine X 2) w Reflex to ID Panel     Status: None (Preliminary result)   Collection Time: 11/23/16  9:19 AM  Result Value Ref Range Status   Specimen Description BLOOD RIGHT ANTECUBITAL  Final   Special Requests   Final    BOTTLES DRAWN AEROBIC AND ANAEROBIC Blood Culture adequate volume   Culture NO GROWTH < 12 HOURS  Final   Report Status PENDING  Incomplete  Culture, blood (Routine X 2) w Reflex to ID Panel     Status: None (Preliminary result)   Collection Time: 11/23/16  9:19 AM  Result Value Ref Range Status   Specimen Description BLOOD RIGHT ANTECUBITAL  Final   Special Requests   Final    BOTTLES DRAWN AEROBIC ONLY Blood Culture adequate volume   Culture NO GROWTH < 12 HOURS  Final   Report Status PENDING  Incomplete    Impression/Plan:  1. Post operative wound infection - debrided on 7/27.  Wound culture with MSSA.  Will need 6 weeks of IV cefazolin through September 6th.   Will continue afterwards with oral continuation therapy Ok to pull picc line at the end of therapy I will continue to monitor repeat blood cultures I will arrange follow up in our clinic  2.  MSSA bacteremia - c/w bacteremia from #1.  TTE without obvious vegetation.  No TEE indicated since he will need prolonged duration as in #1.

## 2016-11-24 NOTE — Progress Notes (Signed)
Subjective: Patient reports moderate back pain. Denies any leg pain, numbness, or tingling. Tolerating pain medication  Objective: Vital signs in last 24 hours: Temp:  [98.2 F (36.8 C)-98.9 F (37.2 C)] 98.8 F (37.1 C) (07/30 0528) Pulse Rate:  [57-61] 57 (07/30 0528) Resp:  [18] 18 (07/30 0528) BP: (117-144)/(57-69) 144/59 (07/30 0528) SpO2:  [96 %-98 %] 96 % (07/30 0528)  Intake/Output from previous day: 07/29 0701 - 07/30 0700 In: 1112 [P.O.:912; IV Piggyback:200] Out: 2250 [Urine:2250] Intake/Output this shift: No intake/output data recorded.  Neurologic: Grossly normal  Lab Results: Lab Results  Component Value Date   WBC 10.6 (H) 11/23/2016   HGB 9.9 (L) 11/23/2016   HCT 30.3 (L) 11/23/2016   MCV 94.4 11/23/2016   PLT 187 11/23/2016   Lab Results  Component Value Date   INR 1.12 05/17/2016   BMET Lab Results  Component Value Date   NA 137 11/23/2016   K 3.7 11/23/2016   CL 105 11/23/2016   CO2 26 11/23/2016   GLUCOSE 123 (H) 11/23/2016   BUN 12 11/23/2016   CREATININE 0.71 11/23/2016   CALCIUM 8.2 (L) 11/23/2016    Studies/Results: No results found.  Assessment/Plan: Hemovac d/c. Incision CDI. Awaiting blood culture results. PICC line placed yesterday. Mobilize as tolerated and pain management.   LOS: 3 days    [name] 11/24/2016, 8:42 AM
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
## 2016-11-24 NOTE — Progress Notes (Signed)
  Echocardiogram 2D Echocardiogram has been performed.  Randa Lynn Ignatius Kloos 11/24/2016, 12:44 PM

## 2016-11-24 NOTE — Anesthesia Postprocedure Evaluation (Signed)
Anesthesia Post Note  Patient: [name]  Procedure(s) Performed: Procedure(s) (LRB): WOUND EXPLORATION (N/A)     Patient location during evaluation: PACU Anesthesia Type: General Level of consciousness: sedated, patient cooperative and oriented Pain control: pain improving. Vital Signs Assessment: post-procedure vital signs reviewed and stable Respiratory status: spontaneous breathing, nonlabored ventilation, respiratory function stable and patient connected to nasal cannula oxygen Cardiovascular status: blood pressure returned to baseline and stable Postop Assessment: no signs of nausea or vomiting Anesthetic complications: no Comments: Delayed entry: pt eval in PACU post op                  Karnisha Lefebre,E. Brittanie Dosanjh
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
## 2016-11-24 NOTE — Progress Notes (Signed)
CM received consult : medication needs. Pt has medicaid and doesn't qualify for medication assistance. Whitman Hero RN,BSN,CM

## 2016-11-25 ENCOUNTER — Encounter (HOSPITAL_COMMUNITY): Payer: Self-pay | Admitting: Neurosurgery

## 2016-11-25 MED ORDER — CEFAZOLIN IV (FOR PTA / DISCHARGE USE ONLY): 2.0000 g | Freq: Three times a day (TID) | INTRAVENOUS | 0 refills | Status: AC

## 2016-11-25 MED ORDER — RIFAMPIN 300 MG PO CAPS: 300.0000 mg | ORAL_CAPSULE | Freq: Two times a day (BID) | ORAL | 1 refills | Status: DC

## 2016-11-25 MED ORDER — OXYCODONE HCL 5 MG PO TABS: 10.0000 mg | ORAL_TABLET | Freq: Four times a day (QID) | ORAL | 0 refills | Status: DC | PRN

## 2016-11-25 MED ORDER — HEPARIN SOD (PORK) LOCK FLUSH 100 UNIT/ML IV SOLN
250.0000 [IU] | INTRAVENOUS | Status: AC | PRN
  Administered 2016-11-25: 250 [IU]

## 2016-11-25 NOTE — Progress Notes (Signed)
[name] to be D/C'd Home per MD order.  Discussed with the patient and all questions fully answered.  VSS, Skin clean, dry and intact without evidence of skin break down, no evidence of skin tears noted. IV catheter discontinued intact. Site without signs and symptoms of complications. Dressing and pressure applied.  An After Visit Summary was printed and given to the patient. Patient received prescription.  D/c education completed with patient/family including follow up instructions, medication list, d/c activities limitations if indicated, with other d/c instructions as indicated by MD - patient able to verbalize understanding, all questions fully answered.   Patient instructed to return to ED, call 911, or call MD for any changes in condition.   Patient escorted via [place], and D/C home via private auto.  Luci Bank 11/25/2016 5:52 PM

## 2016-11-25 NOTE — Progress Notes (Signed)
[place] will be providing [facility]- home IV Ancef starting with 10 pm dose tonight.  [facility] will be the home care provider for [name]  Contact:  Sloan Leiter  If patient discharges after hours, please call [phone].   [name] 11/25/2016, 4:16 PM
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
## 2016-11-25 NOTE — Progress Notes (Addendum)
    [facility] for Infectious Disease   Reason for visit: Follow up on MSSA bacteremia, wound infection  Interval History: repeat blood cultures remain ngtd.    Physical Exam: Constitutional:  Vitals:   11/24/16 2123 11/25/16 0555  BP: 128/64 136/61  Pulse: 62 64  Resp: 18 18  Temp: 98.9 F (37.2 C) 98.4 F (36.9 C)   patient appears in NAD  Impression: Stable post operative wound infection with hardware and MSSA bacteremia.   Plan: 1.  No changes, as per consult note.  Cefazolin through 9/7 + rifampin 300 mg twice a day.  I will arrange follow up.  OPAT consult already in. Ok from [place] standpoint for d/c today

## 2016-11-25 NOTE — Progress Notes (Signed)
Subjective: Patient reports moderate back pain. Denies any weakness in his legs. Doing well.   Objective: Vital signs in last 24 hours: Temp:  [98.4 F (36.9 C)-98.9 F (37.2 C)] 98.4 F (36.9 C) (07/31 0555) Pulse Rate:  [62-64] 64 (07/31 0555) Resp:  [18] 18 (07/31 0555) BP: (128-136)/(61-64) 136/61 (07/31 0555) SpO2:  [98 %] 98 % (07/31 0555)  Intake/Output from previous day: 07/30 0701 - 07/31 0700 In: 460 [P.O.:360; IV Piggyback:100] Out: 3200 [Urine:3200] Intake/Output this shift: Total I/O In: 100 [IV Piggyback:100] Out: -   Neurologic: Grossly normal  Lab Results: Lab Results  Component Value Date   WBC 10.6 (H) 11/23/2016   HGB 9.9 (L) 11/23/2016   HCT 30.3 (L) 11/23/2016   MCV 94.4 11/23/2016   PLT 187 11/23/2016   Lab Results  Component Value Date   INR 1.12 05/17/2016   BMET Lab Results  Component Value Date   NA 137 11/23/2016   K 3.7 11/23/2016   CL 105 11/23/2016   CO2 26 11/23/2016   GLUCOSE 123 (H) 11/23/2016   BUN 12 11/23/2016   CREATININE 0.71 11/23/2016   CALCIUM 8.2 (L) 11/23/2016    Studies/Results: No results found.  Assessment/Plan: Doing well from a NS standpoint. Possible d/c this afternoon if ok with ID. He will be getting training on his PICC line today around noon.    LOS: 4 days    Ocie Cornfield Amybeth Sieg 11/25/2016, 8:21 AM
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
## 2016-11-28 LAB — CULTURE, BLOOD (ROUTINE X 2)
CULTURE: NO GROWTH
Culture: NO GROWTH
SPECIAL REQUESTS: ADEQUATE
Special Requests: ADEQUATE

## 2016-12-02 ENCOUNTER — Telehealth: Payer: Self-pay | Admitting: *Deleted

## 2016-12-02 ENCOUNTER — Other Ambulatory Visit: Payer: Self-pay | Admitting: Pharmacist

## 2016-12-02 NOTE — Telephone Encounter (Signed)
Ann the RN providing home care for the patient called to advise that the patient is having sever Nausea when he takes the IV med and it last most of the day. She was wondering if we could give Rx for something he could take to help with this. Advised she will need to page the doctor as the patient has not been seen her and she advised she paged the provider earlier today and got no answer and the patient is ready to stop the medication if he can not get releif. Advised will send note to the pharmacy here and provider to see if there is something we can do. Advised the provider is out of the office. Advised someone will call her back as soon as possible.  Lelon Frohlich RN [phone]

## 2016-12-03 NOTE — Telephone Encounter (Signed)
Yes. I will call patient and send in an Rx for him!

## 2016-12-10 ENCOUNTER — Other Ambulatory Visit: Payer: Self-pay | Admitting: Pharmacist

## 2016-12-18 NOTE — Addendum Note (Signed)
Addendum  created 12/18/16 4158 by Roberts Gaudy, MD   Sign clinical note

## 2016-12-22 ENCOUNTER — Telehealth: Payer: Self-pay | Admitting: *Deleted

## 2016-12-22 ENCOUNTER — Encounter: Payer: Self-pay | Admitting: Internal Medicine

## 2016-12-22 ENCOUNTER — Ambulatory Visit (INDEPENDENT_AMBULATORY_CARE_PROVIDER_SITE_OTHER): Payer: Medicaid Other | Admitting: Internal Medicine

## 2016-12-22 VITALS — BP 115/70 | HR 76 | Temp 98.3°F | Ht 71.0 in | Wt 197.0 lb

## 2016-12-22 DIAGNOSIS — T814XXD Infection following a procedure, subsequent encounter: Secondary | ICD-10-CM | POA: Diagnosis not present

## 2016-12-22 DIAGNOSIS — B9561 Methicillin susceptible Staphylococcus aureus infection as the cause of diseases classified elsewhere: Secondary | ICD-10-CM

## 2016-12-22 DIAGNOSIS — R7881 Bacteremia: Secondary | ICD-10-CM | POA: Diagnosis not present

## 2016-12-22 DIAGNOSIS — Z5181 Encounter for therapeutic drug level monitoring: Secondary | ICD-10-CM | POA: Insufficient documentation

## 2016-12-22 DIAGNOSIS — IMO0001 Reserved for inherently not codable concepts without codable children: Secondary | ICD-10-CM

## 2016-12-22 MED ORDER — CEPHALEXIN 500 MG PO CAPS: 500.0000 mg | ORAL_CAPSULE | Freq: Four times a day (QID) | ORAL | 2 refills | Status: DC

## 2016-12-22 NOTE — Assessment & Plan Note (Signed)
No leukopenia, normal creat

## 2016-12-22 NOTE — Assessment & Plan Note (Signed)
He will go back to the surgeon asap for suture evaluation and removal.

## 2016-12-22 NOTE — Telephone Encounter (Signed)
Verbal order per Dr. Linus Salmons given to Corretta at [place] to pull patient's picc line on 9.7.[place]
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
## 2016-12-22 NOTE — Assessment & Plan Note (Signed)
Cleared on repeat blood cultures

## 2016-12-24 ENCOUNTER — Other Ambulatory Visit: Payer: Self-pay | Admitting: Pharmacist

## 2016-12-31 ENCOUNTER — Other Ambulatory Visit: Payer: Self-pay | Admitting: Internal Medicine

## 2017-01-05 ENCOUNTER — Telehealth: Payer: Self-pay | Admitting: *Deleted

## 2017-01-05 NOTE — Telephone Encounter (Signed)
Patient left message in triage, asking where his oral antibiotics were sent. RN returned call, his sister [name] answered the phone. Patient's cephalexin was sent on 8/27 to [facility] in [place]. She will let him know. Landis Gandy, RN

## 2017-02-03 ENCOUNTER — Ambulatory Visit: Payer: Self-pay | Admitting: Internal Medicine

## 2017-02-11 ENCOUNTER — Ambulatory Visit (INDEPENDENT_AMBULATORY_CARE_PROVIDER_SITE_OTHER): Payer: Medicaid Other | Admitting: Internal Medicine

## 2017-02-11 ENCOUNTER — Encounter: Payer: Self-pay | Admitting: Internal Medicine

## 2017-02-11 VITALS — BP 145/63 | HR 56 | Temp 98.2°F | Wt 198.0 lb

## 2017-02-11 DIAGNOSIS — Z5181 Encounter for therapeutic drug level monitoring: Secondary | ICD-10-CM

## 2017-02-11 DIAGNOSIS — R7881 Bacteremia: Secondary | ICD-10-CM | POA: Diagnosis not present

## 2017-02-11 DIAGNOSIS — T8149XA Infection following a procedure, other surgical site, initial encounter: Secondary | ICD-10-CM

## 2017-02-11 DIAGNOSIS — B9561 Methicillin susceptible Staphylococcus aureus infection as the cause of diseases classified elsewhere: Secondary | ICD-10-CM

## 2017-02-11 NOTE — Assessment & Plan Note (Signed)
Now resolved.  From back infection
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
## 2017-02-11 NOTE — Assessment & Plan Note (Signed)
I will check a cmp today on keflex

## 2017-02-11 NOTE — Assessment & Plan Note (Signed)
Improved.  I plan to continue keflex for 2 more months and stop.  I will check inflammatory markers today to be sure ok.

## 2017-02-12 LAB — COMPLETE METABOLIC PANEL WITH GFR
AG RATIO: 1.2 (calc) (ref 1.0–2.5)
ALT: 10 U/L (ref 9–46)
AST: 20 U/L (ref 10–35)
Albumin: 3.7 g/dL (ref 3.6–5.1)
Alkaline phosphatase (APISO): 93 U/L (ref 40–115)
BILIRUBIN TOTAL: 0.3 mg/dL (ref 0.2–1.2)
BUN: 15 mg/dL (ref 7–25)
CHLORIDE: 101 mmol/L (ref 98–110)
CO2: 27 mmol/L (ref 20–32)
Calcium: 8.9 mg/dL (ref 8.6–10.3)
Creat: 1.23 mg/dL (ref 0.70–1.33)
GFR, Est African American: 77 mL/min/{1.73_m2} (ref >=60)
GFR, Est Non African American: 67 mL/min/{1.73_m2} (ref >=60)
Globulin: 3 g/dL (calc) (ref 1.9–3.7)
Glucose, Bld: 81 mg/dL (ref 65–99)
POTASSIUM: 3.9 mmol/L (ref 3.5–5.3)
Sodium: 135 mmol/L (ref 135–146)
Total Protein: 6.7 g/dL (ref 6.1–8.1)

## 2017-02-12 LAB — C-REACTIVE PROTEIN: CRP: 7.6 mg/L (ref <8.0)

## 2017-02-12 LAB — CBC WITH DIFFERENTIAL/PLATELET
BASOS PCT: 1.1 %
Basophils Absolute: 59 cells/uL (ref 0–200)
EOS PCT: 4.5 %
Eosinophils Absolute: 243 cells/uL (ref 15–500)
HCT: 32.7 % — ABNORMAL LOW (ref 38.5–50.0)
HEMOGLOBIN: 10.9 g/dL — AB (ref 13.2–17.1)
Lymphs Abs: 1112 cells/uL (ref 850–3900)
MCH: 30.4 pg (ref 27.0–33.0)
MCHC: 33.3 g/dL (ref 32.0–36.0)
MCV: 91.1 fL (ref 80.0–100.0)
MONOS PCT: 8.6 %
MPV: 10.8 fL (ref 7.5–12.5)
NEUTROS ABS: 3521 {cells}/uL (ref 1500–7800)
Neutrophils Relative %: 65.2 %
PLATELETS: 165 10*3/uL (ref 140–400)
RBC: 3.59 10*6/uL — ABNORMAL LOW (ref 4.20–5.80)
RDW: 15.3 % — AB (ref 11.0–15.0)
Total Lymphocyte: 20.6 %
WBC mixed population: 464 cells/uL (ref 200–950)
WBC: 5.4 10*3/uL (ref 3.8–10.8)

## 2017-02-12 LAB — SEDIMENTATION RATE: Sed Rate: 65 mm/h — ABNORMAL HIGH (ref 0–20)

## 2017-02-24 ENCOUNTER — Ambulatory Visit: Payer: Medicaid Other | Attending: Nurse Practitioner | Admitting: Nurse Practitioner

## 2017-03-16 ENCOUNTER — Emergency Department (HOSPITAL_COMMUNITY): Payer: Medicaid Other

## 2017-03-16 ENCOUNTER — Encounter (HOSPITAL_COMMUNITY): Payer: Self-pay

## 2017-03-16 ENCOUNTER — Emergency Department (HOSPITAL_COMMUNITY)
Admission: EM | Admit: 2017-03-16 | Discharge: 2017-03-16 | Disposition: A | Discharge status: Home / Self Care | Payer: Medicaid Other | Attending: Emergency Medicine | Admitting: Emergency Medicine

## 2017-03-16 ENCOUNTER — Other Ambulatory Visit: Payer: Self-pay

## 2017-03-16 DIAGNOSIS — E039 Hypothyroidism, unspecified: Secondary | ICD-10-CM | POA: Diagnosis not present

## 2017-03-16 DIAGNOSIS — Z885 Allergy status to narcotic agent status: Secondary | ICD-10-CM | POA: Insufficient documentation

## 2017-03-16 DIAGNOSIS — Z8546 Personal history of malignant neoplasm of prostate: Secondary | ICD-10-CM | POA: Diagnosis not present

## 2017-03-16 DIAGNOSIS — Z79899 Other long term (current) drug therapy: Secondary | ICD-10-CM | POA: Diagnosis not present

## 2017-03-16 DIAGNOSIS — F17210 Nicotine dependence, cigarettes, uncomplicated: Secondary | ICD-10-CM | POA: Diagnosis not present

## 2017-03-16 DIAGNOSIS — M545 Low back pain: Secondary | ICD-10-CM | POA: Diagnosis present

## 2017-03-16 DIAGNOSIS — G8929 Other chronic pain: Secondary | ICD-10-CM | POA: Insufficient documentation

## 2017-03-16 DIAGNOSIS — G8918 Other acute postprocedural pain: Secondary | ICD-10-CM | POA: Insufficient documentation

## 2017-03-16 DIAGNOSIS — Y658 Other specified misadventures during surgical and medical care: Secondary | ICD-10-CM | POA: Insufficient documentation

## 2017-03-16 DIAGNOSIS — R0602 Shortness of breath: Secondary | ICD-10-CM | POA: Diagnosis not present

## 2017-03-16 DIAGNOSIS — T8133XA Disruption of traumatic injury wound repair, initial encounter: Secondary | ICD-10-CM | POA: Diagnosis not present

## 2017-03-16 DIAGNOSIS — Z8547 Personal history of malignant neoplasm of testis: Secondary | ICD-10-CM | POA: Insufficient documentation

## 2017-03-16 LAB — CBC WITH DIFFERENTIAL/PLATELET
Basophils Absolute: 0 10*3/uL (ref 0.0–0.1)
Basophils Relative: 0 %
Eosinophils Absolute: 0.2 10*3/uL (ref 0.0–0.7)
Eosinophils Relative: 4 %
HCT: 31.7 % — ABNORMAL LOW (ref 39.0–52.0)
Hemoglobin: 10.7 g/dL — ABNORMAL LOW (ref 13.0–17.0)
Lymphocytes Relative: 16 %
Lymphs Abs: 0.8 10*3/uL (ref 0.7–4.0)
MCH: 30.6 pg (ref 26.0–34.0)
MCHC: 33.8 g/dL (ref 30.0–36.0)
MCV: 90.6 fL (ref 78.0–100.0)
Monocytes Absolute: 0.6 10*3/uL (ref 0.1–1.0)
Monocytes Relative: 12 %
Neutro Abs: 3.4 10*3/uL (ref 1.7–7.7)
Neutrophils Relative %: 68 %
Platelets: 196 10*3/uL (ref 150–400)
RBC: 3.5 MIL/uL — ABNORMAL LOW (ref 4.22–5.81)
RDW: 16.9 % — ABNORMAL HIGH (ref 11.5–15.5)
WBC: 5 10*3/uL (ref 4.0–10.5)

## 2017-03-16 LAB — BASIC METABOLIC PANEL
Anion gap: 11 (ref 5–15)
BUN: 7 mg/dL (ref 6–20)
CO2: 25 mmol/L (ref 22–32)
Calcium: 8.2 mg/dL — ABNORMAL LOW (ref 8.9–10.3)
Chloride: 94 mmol/L — ABNORMAL LOW (ref 101–111)
Creatinine, Ser: 0.74 mg/dL (ref 0.61–1.24)
GFR calc Af Amer: >60 mL/min (ref >60)
GFR calc non Af Amer: >60 mL/min (ref >60)
Glucose, Bld: 93 mg/dL (ref 65–99)
Potassium: 3.4 mmol/L — ABNORMAL LOW (ref 3.5–5.1)
Sodium: 130 mmol/L — ABNORMAL LOW (ref 135–145)

## 2017-03-16 LAB — C-REACTIVE PROTEIN: CRP: 7.9 mg/dL — ABNORMAL HIGH (ref <1.0)

## 2017-03-16 LAB — SEDIMENTATION RATE: Sed Rate: 124 mm/hr — ABNORMAL HIGH (ref 0–16)

## 2017-03-16 MED ORDER — DOXYCYCLINE HYCLATE 100 MG PO CAPS: 100.0000 mg | ORAL_CAPSULE | Freq: Two times a day (BID) | ORAL | 0 refills | Status: DC

## 2017-03-16 MED ORDER — MORPHINE SULFATE (PF) 4 MG/ML IV SOLN
8.0000 mg | Freq: Once | INTRAVENOUS | Status: AC
  Administered 2017-03-16: 8 mg via INTRAVENOUS
  Filled 2017-03-16: qty 2

## 2017-03-16 MED ORDER — MORPHINE SULFATE (PF) 4 MG/ML IV SOLN
6.0000 mg | Freq: Once | INTRAVENOUS | Status: AC
  Administered 2017-03-16: 6 mg via INTRAVENOUS
  Filled 2017-03-16: qty 2

## 2017-03-16 MED ORDER — SODIUM CHLORIDE 0.9 % IV SOLN
INTRAVENOUS | Status: DC
  Administered 2017-03-16: 13:00:00 via INTRAVENOUS

## 2017-03-16 MED ORDER — HYDROMORPHONE HCL 1 MG/ML IJ SOLN
1.0000 mg | Freq: Once | INTRAMUSCULAR | Status: AC
  Administered 2017-03-16: 1 mg via INTRAVENOUS
  Filled 2017-03-16: qty 1

## 2017-03-16 MED ORDER — LORAZEPAM 2 MG/ML IJ SOLN
0.5000 mg | Freq: Once | INTRAMUSCULAR | Status: AC
  Administered 2017-03-16: 0.5 mg via INTRAVENOUS
  Filled 2017-03-16: qty 1

## 2017-03-16 MED ORDER — GADOBENATE DIMEGLUMINE 529 MG/ML IV SOLN
20.0000 mL | Freq: Once | INTRAVENOUS | Status: AC | PRN
  Administered 2017-03-16: 19 mL via INTRAVENOUS

## 2017-03-16 NOTE — ED Provider Notes (Signed)
Patient was seen by Dr. Wilson Singer earlier.  Please see his note.  MRI findings were discussed with his neurosurgeon, Dr. Trenton Gammon.  Plan on discharge home with doxycycline.  Patient is appropriate for outpatient follow-up.   Dorie Rank, MD 03/16/17 2030

## 2017-03-16 NOTE — ED Notes (Signed)
MRI came for patient, but he needed pain medication before leaving. This RN had "issues" with the pyxis and was unable to obtain meds in time for him to be transported before MRI's next appointment. MRI staff was very understanding and I will be in contact to see when they can fit him in next.

## 2017-03-16 NOTE — Discharge Instructions (Signed)
Start taking the doxycycline, call to schedule follow up with Dr Annette Stable
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
## 2017-03-16 NOTE — ED Triage Notes (Signed)
Patient states he was recently hospitalized for an infection following back surgery less than a month ago. Patient states he is now having SOB and increased pain in the mid back.

## 2017-03-21 LAB — CULTURE, BLOOD (ROUTINE X 2)
Culture: NO GROWTH
Culture: NO GROWTH
Special Requests: ADEQUATE
Special Requests: ADEQUATE

## 2017-04-29 ENCOUNTER — Ambulatory Visit: Payer: Self-pay | Admitting: Internal Medicine

## 2017-05-18 ENCOUNTER — Other Ambulatory Visit: Payer: Self-pay | Admitting: Neurosurgery

## 2017-05-18 DIAGNOSIS — S22081A Stable burst fracture of T11-T12 vertebra, initial encounter for closed fracture: Secondary | ICD-10-CM

## 2017-05-25 ENCOUNTER — Other Ambulatory Visit: Payer: Self-pay

## 2017-05-29 ENCOUNTER — Ambulatory Visit
Admission: RE | Admit: 2017-05-29 | Discharge: 2017-05-29 | Disposition: A | Discharge status: Home / Self Care | Payer: Medicaid Other | Source: Ambulatory Visit | Attending: Neurosurgery | Admitting: Neurosurgery

## 2017-05-29 DIAGNOSIS — S22081A Stable burst fracture of T11-T12 vertebra, initial encounter for closed fracture: Secondary | ICD-10-CM

## 2017-09-10 ENCOUNTER — Other Ambulatory Visit: Payer: Self-pay | Admitting: Neurological Surgery

## 2017-09-10 ENCOUNTER — Other Ambulatory Visit: Payer: Self-pay | Admitting: Neurosurgery

## 2017-09-10 DIAGNOSIS — S22081A Stable burst fracture of T11-T12 vertebra, initial encounter for closed fracture: Secondary | ICD-10-CM

## 2017-09-18 ENCOUNTER — Ambulatory Visit
Admission: RE | Admit: 2017-09-18 | Discharge: 2017-09-18 | Disposition: A | Discharge status: Home / Self Care | Payer: Medicaid Other | Source: Ambulatory Visit | Attending: Neurological Surgery | Admitting: Neurological Surgery

## 2017-09-18 DIAGNOSIS — S22081A Stable burst fracture of T11-T12 vertebra, initial encounter for closed fracture: Secondary | ICD-10-CM

## 2017-11-05 ENCOUNTER — Other Ambulatory Visit: Payer: Self-pay | Admitting: Neurosurgery

## 2017-11-11 ENCOUNTER — Encounter (HOSPITAL_COMMUNITY)
Admission: RE | Admit: 2017-11-11 | Discharge: 2017-11-11 | Disposition: A | Discharge status: Home / Self Care | Payer: Medicaid Other | Source: Ambulatory Visit | Attending: Neurosurgery | Admitting: Neurosurgery

## 2017-11-11 ENCOUNTER — Encounter (HOSPITAL_COMMUNITY): Payer: Self-pay

## 2017-11-11 ENCOUNTER — Other Ambulatory Visit: Payer: Self-pay

## 2017-11-11 DIAGNOSIS — F329 Major depressive disorder, single episode, unspecified: Secondary | ICD-10-CM | POA: Diagnosis not present

## 2017-11-11 DIAGNOSIS — Z79899 Other long term (current) drug therapy: Secondary | ICD-10-CM | POA: Insufficient documentation

## 2017-11-11 DIAGNOSIS — Y838 Other surgical procedures as the cause of abnormal reaction of the patient, or of later complication, without mention of misadventure at the time of the procedure: Secondary | ICD-10-CM | POA: Insufficient documentation

## 2017-11-11 DIAGNOSIS — Z01818 Encounter for other preprocedural examination: Secondary | ICD-10-CM | POA: Diagnosis present

## 2017-11-11 DIAGNOSIS — Z923 Personal history of irradiation: Secondary | ICD-10-CM | POA: Insufficient documentation

## 2017-11-11 DIAGNOSIS — H9190 Unspecified hearing loss, unspecified ear: Secondary | ICD-10-CM | POA: Insufficient documentation

## 2017-11-11 DIAGNOSIS — F419 Anxiety disorder, unspecified: Secondary | ICD-10-CM | POA: Insufficient documentation

## 2017-11-11 DIAGNOSIS — Z01812 Encounter for preprocedural laboratory examination: Secondary | ICD-10-CM | POA: Insufficient documentation

## 2017-11-11 DIAGNOSIS — F1021 Alcohol dependence, in remission: Secondary | ICD-10-CM | POA: Diagnosis not present

## 2017-11-11 DIAGNOSIS — E890 Postprocedural hypothyroidism: Secondary | ICD-10-CM | POA: Insufficient documentation

## 2017-11-11 DIAGNOSIS — Z981 Arthrodesis status: Secondary | ICD-10-CM | POA: Diagnosis not present

## 2017-11-11 DIAGNOSIS — K7460 Unspecified cirrhosis of liver: Secondary | ICD-10-CM | POA: Insufficient documentation

## 2017-11-11 DIAGNOSIS — Z8701 Personal history of pneumonia (recurrent): Secondary | ICD-10-CM | POA: Diagnosis not present

## 2017-11-11 DIAGNOSIS — Z8547 Personal history of malignant neoplasm of testis: Secondary | ICD-10-CM | POA: Diagnosis not present

## 2017-11-11 DIAGNOSIS — T8484XA Pain due to internal orthopedic prosthetic devices, implants and grafts, initial encounter: Secondary | ICD-10-CM | POA: Insufficient documentation

## 2017-11-11 DIAGNOSIS — G8220 Paraplegia, unspecified: Secondary | ICD-10-CM | POA: Diagnosis not present

## 2017-11-11 DIAGNOSIS — Z9079 Acquired absence of other genital organ(s): Secondary | ICD-10-CM | POA: Insufficient documentation

## 2017-11-11 DIAGNOSIS — Z8546 Personal history of malignant neoplasm of prostate: Secondary | ICD-10-CM | POA: Diagnosis not present

## 2017-11-11 HISTORY — DX: Pain due to internal orthopedic prosthetic devices, implants and grafts, initial encounter: T84.84XA

## 2017-11-11 HISTORY — DX: Presence of dental prosthetic device (complete) (partial): Z97.2

## 2017-11-11 HISTORY — DX: Complete loss of teeth, unspecified cause, unspecified class: K08.109

## 2017-11-11 LAB — PROTIME-INR
INR: 0.97
Prothrombin Time: 12.8 seconds (ref 11.4–15.2)

## 2017-11-11 LAB — COMPREHENSIVE METABOLIC PANEL
ALT: 12 U/L (ref 0–44)
AST: 17 U/L (ref 15–41)
Albumin: 3.6 g/dL (ref 3.5–5.0)
Alkaline Phosphatase: 78 U/L (ref 38–126)
Anion gap: 7 (ref 5–15)
BUN: 12 mg/dL (ref 6–20)
CHLORIDE: 105 mmol/L (ref 98–111)
CO2: 26 mmol/L (ref 22–32)
CREATININE: 0.91 mg/dL (ref 0.61–1.24)
Calcium: 9.4 mg/dL (ref 8.9–10.3)
GFR calc Af Amer: >60 mL/min (ref >60)
GFR calc non Af Amer: >60 mL/min (ref >60)
Glucose, Bld: 96 mg/dL (ref 70–99)
Potassium: 4.5 mmol/L (ref 3.5–5.1)
SODIUM: 138 mmol/L (ref 135–145)
Total Bilirubin: 0.5 mg/dL (ref 0.3–1.2)
Total Protein: 7 g/dL (ref 6.5–8.1)

## 2017-11-11 LAB — CBC
HCT: 40.9 % (ref 39.0–52.0)
Hemoglobin: 13.1 g/dL (ref 13.0–17.0)
MCH: 31.1 pg (ref 26.0–34.0)
MCHC: 32 g/dL (ref 30.0–36.0)
MCV: 97.1 fL (ref 78.0–100.0)
PLATELETS: 180 10*3/uL (ref 150–400)
RBC: 4.21 MIL/uL — AB (ref 4.22–5.81)
RDW: 14.3 % (ref 11.5–15.5)
WBC: 5.5 10*3/uL (ref 4.0–10.5)

## 2017-11-11 LAB — SURGICAL PCR SCREEN
MRSA, PCR: NEGATIVE
STAPHYLOCOCCUS AUREUS: NEGATIVE

## 2017-11-11 NOTE — Progress Notes (Signed)
Pt denies SOB, chest pain, and being under the care of a cardiologist. Pt denies having a stress test and cardiac cath. Pt denies having a chest x ray within the last year. Pt denies recent labs. Pt chart forwarded to anesthesia for review of history and echo.

## 2017-11-11 NOTE — Pre-Procedure Instructions (Signed)
   [name]  11/11/2017     [facility] - [place] - [address] [place] [address] Phone: [phone] Fax: [phone]  [place], [place] [address] Phone: [phone] Fax: [phone]  [place], [place] - [place] [address] Phone: [phone] Fax: [phone]  [facility] [address], [place] [address] Phone: [phone] Fax: [phone]   Your procedure is scheduled on Friday, November 20, 2017  Report to [facility] Admitting at 10:00 A.M.  Call this number if you have problems the morning of surgery:  [phone]   Remember:  Do not eat or drink after midnight Thursday, November 19, 2017  Take these medicines the morning of surgery with A SIP OF WATER : levothyroxine (SYNTHROID), LORazepam (ATIVAN), if needed: Oxycodone   Stop taking Aspirin (unless advised otherwise by surgeon),vitamins, fish oil and herbal medications. Do not take any NSAIDs ie: Ibuprofen, Advil, Naproxen (Aleve), Motrin, BC and Goody Powder; stop Friday, November 13, 2017.  Do not wear jewelry, make-up or nail polish.  Do not wear lotions, powders, or perfumes, or deodorant.  Do not shave 48 hours prior to surgery.  Men may shave face and neck.  Do not bring valuables to the hospital.  [facility] is not responsible for any belongings or valuables.  Contacts, dentures or bridgework may not be worn into surgery.  Leave your suitcase in the car.  After surgery it may be brought to your room. Patients discharged the day of surgery will not be allowed to drive home.  Special instructions: Shower the night before surgery and the morning of surgery with CHG. Please read over the following fact sheets that you were  given. Pain Booklet, Coughing and Deep Breathing, MRSA Information and Surgical Site Infection Prevention
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
## 2017-11-19 NOTE — Anesthesia Preprocedure Evaluation (Signed)
Anesthesia Evaluation  Patient identified by MRN, date of birth, ID band Patient awake    Reviewed: Allergy & Precautions, NPO status , Patient's Chart, lab work & pertinent test results  History of Anesthesia Complications Negative for: history of anesthetic complications  Airway Mallampati: II  TM Distance: >3 FB Neck ROM: Full    Dental  (+) Edentulous Upper, Edentulous Lower   Pulmonary COPD, Current Smoker,    breath sounds clear to auscultation       Cardiovascular hypertension (no meds),  Rhythm:Regular Rate:Normal  '11 ECHO: EF 55-60%, valves OK   Neuro/Psych Seizures -,  Anxiety Depression    GI/Hepatic negative GI ROS, (+)     substance abuse  alcohol use,   Endo/Other  Hypothyroidism Morbid obesity  Renal/GU negative Renal ROS     Musculoskeletal   Abdominal (+) + obese,   Peds  Hematology Hb 10.6   Anesthesia Other Findings Prostate cancer  Reproductive/Obstetrics                             Anesthesia Physical  Anesthesia Plan  ASA: III  Anesthesia Plan: General   Post-op Pain Management:    Induction: Intravenous  PONV Risk Score and Plan: 2 and Ondansetron and Dexamethasone  Airway Management Planned: Oral ETT  Additional Equipment:   Intra-op Plan:   Post-operative Plan: Extubation in OR  Informed Consent: I have reviewed the patients History and Physical, chart, labs and discussed the procedure including the risks, benefits and alternatives for the proposed anesthesia with the patient or authorized representative who has indicated his/her understanding and acceptance.     Plan Discussed with: CRNA, Surgeon and Anesthesiologist  Anesthesia Plan Comments: (Plan routine monitors, GETA)        Anesthesia Quick Evaluation

## 2017-11-20 ENCOUNTER — Encounter (HOSPITAL_COMMUNITY): Payer: Self-pay | Admitting: *Deleted

## 2017-11-20 ENCOUNTER — Encounter (HOSPITAL_COMMUNITY)
Admission: RE | Disposition: A | Discharge status: Home / Self Care | Payer: Self-pay | Source: Ambulatory Visit | Attending: Neurosurgery

## 2017-11-20 ENCOUNTER — Other Ambulatory Visit: Payer: Self-pay

## 2017-11-20 ENCOUNTER — Ambulatory Visit (HOSPITAL_COMMUNITY): Payer: Medicaid Other | Admitting: Anesthesiology

## 2017-11-20 ENCOUNTER — Observation Stay (HOSPITAL_COMMUNITY)
Admission: RE | Admit: 2017-11-20 | Discharge: 2017-11-21 | Disposition: A | Discharge status: Home / Self Care | Payer: Medicaid Other | Source: Ambulatory Visit | Attending: Neurosurgery | Admitting: Neurosurgery

## 2017-11-20 ENCOUNTER — Ambulatory Visit (HOSPITAL_COMMUNITY): Payer: Medicaid Other | Admitting: Emergency Medicine

## 2017-11-20 DIAGNOSIS — I10 Essential (primary) hypertension: Secondary | ICD-10-CM | POA: Insufficient documentation

## 2017-11-20 DIAGNOSIS — S32009K Unspecified fracture of unspecified lumbar vertebra, subsequent encounter for fracture with nonunion: Secondary | ICD-10-CM | POA: Diagnosis present

## 2017-11-20 DIAGNOSIS — Z923 Personal history of irradiation: Secondary | ICD-10-CM | POA: Insufficient documentation

## 2017-11-20 DIAGNOSIS — F17210 Nicotine dependence, cigarettes, uncomplicated: Secondary | ICD-10-CM | POA: Diagnosis not present

## 2017-11-20 DIAGNOSIS — F419 Anxiety disorder, unspecified: Secondary | ICD-10-CM | POA: Insufficient documentation

## 2017-11-20 DIAGNOSIS — Z79891 Long term (current) use of opiate analgesic: Secondary | ICD-10-CM | POA: Diagnosis not present

## 2017-11-20 DIAGNOSIS — Z8547 Personal history of malignant neoplasm of testis: Secondary | ICD-10-CM | POA: Insufficient documentation

## 2017-11-20 DIAGNOSIS — E890 Postprocedural hypothyroidism: Secondary | ICD-10-CM | POA: Diagnosis not present

## 2017-11-20 DIAGNOSIS — Z79899 Other long term (current) drug therapy: Secondary | ICD-10-CM | POA: Diagnosis not present

## 2017-11-20 DIAGNOSIS — Y831 Surgical operation with implant of artificial internal device as the cause of abnormal reaction of the patient, or of later complication, without mention of misadventure at the time of the procedure: Secondary | ICD-10-CM | POA: Insufficient documentation

## 2017-11-20 DIAGNOSIS — F329 Major depressive disorder, single episode, unspecified: Secondary | ICD-10-CM | POA: Diagnosis not present

## 2017-11-20 DIAGNOSIS — T84226A Displacement of internal fixation device of vertebrae, initial encounter: Principal | ICD-10-CM | POA: Insufficient documentation

## 2017-11-20 DIAGNOSIS — J449 Chronic obstructive pulmonary disease, unspecified: Secondary | ICD-10-CM | POA: Insufficient documentation

## 2017-11-20 DIAGNOSIS — Z8546 Personal history of malignant neoplasm of prostate: Secondary | ICD-10-CM | POA: Insufficient documentation

## 2017-11-20 DIAGNOSIS — Z6828 Body mass index (BMI) 28.0-28.9, adult: Secondary | ICD-10-CM | POA: Insufficient documentation

## 2017-11-20 DIAGNOSIS — K7460 Unspecified cirrhosis of liver: Secondary | ICD-10-CM | POA: Diagnosis not present

## 2017-11-20 HISTORY — PX: HARDWARE REMOVAL: SHX979

## 2017-11-20 LAB — CBC WITH DIFFERENTIAL/PLATELET
Abs Immature Granulocytes: 0.2 10*3/uL — ABNORMAL HIGH (ref 0.0–0.1)
BASOS ABS: 0.1 10*3/uL (ref 0.0–0.1)
BASOS PCT: 1 %
EOS PCT: 2 %
Eosinophils Absolute: 0.2 10*3/uL (ref 0.0–0.7)
HCT: 36.1 % — ABNORMAL LOW (ref 39.0–52.0)
HEMOGLOBIN: 11.9 g/dL — AB (ref 13.0–17.0)
Immature Granulocytes: 2 %
Lymphocytes Relative: 12 %
Lymphs Abs: 1.2 10*3/uL (ref 0.7–4.0)
MCH: 31.1 pg (ref 26.0–34.0)
MCHC: 33 g/dL (ref 30.0–36.0)
MCV: 94.3 fL (ref 78.0–100.0)
MONO ABS: 1 10*3/uL (ref 0.1–1.0)
Monocytes Relative: 10 %
Neutro Abs: 7.6 10*3/uL (ref 1.7–7.7)
Neutrophils Relative %: 73 %
Platelets: 183 10*3/uL (ref 150–400)
RBC: 3.83 MIL/uL — ABNORMAL LOW (ref 4.22–5.81)
RDW: 14 % (ref 11.5–15.5)
WBC: 10.3 10*3/uL (ref 4.0–10.5)

## 2017-11-20 SURGERY — REMOVAL, HARDWARE
Anesthesia: General | Site: Back

## 2017-11-20 MED ORDER — FENTANYL CITRATE (PF) 100 MCG/2ML IJ SOLN
25.0000 ug | INTRAMUSCULAR | Status: DC | PRN
  Administered 2017-11-20 (×2): 50 ug via INTRAVENOUS

## 2017-11-20 MED ORDER — KETOROLAC TROMETHAMINE 30 MG/ML IJ SOLN
INTRAMUSCULAR | Status: DC | PRN
  Administered 2017-11-20: 30 mg via INTRAVENOUS

## 2017-11-20 MED ORDER — ONDANSETRON HCL 4 MG/2ML IJ SOLN: 4.0000 mg | Freq: Four times a day (QID) | INTRAMUSCULAR | Status: DC | PRN

## 2017-11-20 MED ORDER — FENTANYL CITRATE (PF) 100 MCG/2ML IJ SOLN
INTRAMUSCULAR | Status: AC
  Administered 2017-11-20: 50 ug via INTRAVENOUS
  Filled 2017-11-20: qty 2

## 2017-11-20 MED ORDER — THROMBIN 5000 UNITS EX SOLR
CUTANEOUS | Status: AC
  Filled 2017-11-20: qty 5000

## 2017-11-20 MED ORDER — HYDROMORPHONE HCL 1 MG/ML IJ SOLN: 1.0000 mg | INTRAMUSCULAR | Status: DC | PRN

## 2017-11-20 MED ORDER — OXYCODONE HCL 5 MG PO TABS
5.0000 mg | ORAL_TABLET | Freq: Once | ORAL | Status: AC | PRN
Start: 2017-11-20 — End: 2017-11-20
  Administered 2017-11-20: 5 mg via ORAL

## 2017-11-20 MED ORDER — THROMBIN 20000 UNITS EX SOLR
CUTANEOUS | Status: DC | PRN
  Administered 2017-11-20: 08:00:00 via TOPICAL

## 2017-11-20 MED ORDER — VANCOMYCIN HCL 1000 MG IV SOLR
INTRAVENOUS | Status: AC
  Filled 2017-11-20: qty 1000

## 2017-11-20 MED ORDER — ALBUTEROL SULFATE (2.5 MG/3ML) 0.083% IN NEBU
2.5000 mg | INHALATION_SOLUTION | Freq: Once | RESPIRATORY_TRACT | Status: AC
  Administered 2017-11-20: 2.5 mg via RESPIRATORY_TRACT
  Filled 2017-11-20 (×2): qty 3

## 2017-11-20 MED ORDER — FENTANYL CITRATE (PF) 100 MCG/2ML IJ SOLN
INTRAMUSCULAR | Status: DC | PRN
  Administered 2017-11-20: 100 ug via INTRAVENOUS

## 2017-11-20 MED ORDER — THROMBIN (RECOMBINANT) 5000 UNITS EX SOLR
CUTANEOUS | Status: AC
  Filled 2017-11-20: qty 5000

## 2017-11-20 MED ORDER — PHENOL 1.4 % MT LIQD: 1.0000 | OROMUCOSAL | Status: DC | PRN

## 2017-11-20 MED ORDER — MENTHOL 3 MG MT LOZG: 1.0000 | LOZENGE | OROMUCOSAL | Status: DC | PRN

## 2017-11-20 MED ORDER — FENTANYL CITRATE (PF) 250 MCG/5ML IJ SOLN
INTRAMUSCULAR | Status: AC
Start: 2017-11-20 — End: ?
  Filled 2017-11-20: qty 5

## 2017-11-20 MED ORDER — SODIUM CHLORIDE 0.9% FLUSH: 3.0000 mL | INTRAVENOUS | Status: DC | PRN

## 2017-11-20 MED ORDER — CHLORHEXIDINE GLUCONATE CLOTH 2 % EX PADS: 6.0000 | MEDICATED_PAD | Freq: Once | CUTANEOUS | Status: DC

## 2017-11-20 MED ORDER — CEFAZOLIN SODIUM-DEXTROSE 2-3 GM-%(50ML) IV SOLR
INTRAVENOUS | Status: DC | PRN
  Administered 2017-11-20: 2 g via INTRAVENOUS

## 2017-11-20 MED ORDER — SUCCINYLCHOLINE CHLORIDE 20 MG/ML IJ SOLN
INTRAMUSCULAR | Status: DC | PRN
  Administered 2017-11-20: 140 mg via INTRAVENOUS

## 2017-11-20 MED ORDER — PROPOFOL 10 MG/ML IV BOLUS
INTRAVENOUS | Status: DC | PRN
  Administered 2017-11-20: 160 mg via INTRAVENOUS

## 2017-11-20 MED ORDER — OXYCODONE HCL 5 MG PO TABS
10.0000 mg | ORAL_TABLET | ORAL | Status: DC
  Administered 2017-11-20 – 2017-11-21 (×6): 10 mg via ORAL
  Filled 2017-11-20 (×6): qty 2

## 2017-11-20 MED ORDER — PROPOFOL 10 MG/ML IV BOLUS
INTRAVENOUS | Status: AC
  Filled 2017-11-20: qty 20

## 2017-11-20 MED ORDER — ACETAMINOPHEN 325 MG PO TABS: 325.0000 mg | ORAL_TABLET | ORAL | Status: DC | PRN

## 2017-11-20 MED ORDER — CEFAZOLIN SODIUM-DEXTROSE 1-4 GM/50ML-% IV SOLN
1.0000 g | Freq: Three times a day (TID) | INTRAVENOUS | Status: AC
  Administered 2017-11-20 (×2): 1 g via INTRAVENOUS
  Filled 2017-11-20 (×2): qty 50

## 2017-11-20 MED ORDER — BUPIVACAINE HCL (PF) 0.25 % IJ SOLN
INTRAMUSCULAR | Status: DC | PRN
  Administered 2017-11-20: 20 mL

## 2017-11-20 MED ORDER — BUPIVACAINE HCL (PF) 0.25 % IJ SOLN
INTRAMUSCULAR | Status: AC
  Filled 2017-11-20: qty 30

## 2017-11-20 MED ORDER — THROMBIN (RECOMBINANT) 20000 UNITS EX SOLR
CUTANEOUS | Status: AC
  Filled 2017-11-20: qty 20000

## 2017-11-20 MED ORDER — LIDOCAINE 2% (20 MG/ML) 5 ML SYRINGE
INTRAMUSCULAR | Status: DC | PRN
  Administered 2017-11-20: 60 mg via INTRAVENOUS

## 2017-11-20 MED ORDER — SODIUM CHLORIDE 0.9 % IV SOLN
250.0000 mL | INTRAVENOUS | Status: DC
  Administered 2017-11-20: 250 mL via INTRAVENOUS

## 2017-11-20 MED ORDER — CYCLOBENZAPRINE HCL 10 MG PO TABS: 10.0000 mg | ORAL_TABLET | Freq: Three times a day (TID) | ORAL | Status: DC | PRN

## 2017-11-20 MED ORDER — ONDANSETRON HCL 4 MG/2ML IJ SOLN: 4.0000 mg | Freq: Once | INTRAMUSCULAR | Status: DC | PRN

## 2017-11-20 MED ORDER — KETOROLAC TROMETHAMINE 15 MG/ML IJ SOLN
30.0000 mg | Freq: Four times a day (QID) | INTRAMUSCULAR | Status: DC
  Administered 2017-11-20 – 2017-11-21 (×3): 30 mg via INTRAVENOUS
  Filled 2017-11-20 (×3): qty 2

## 2017-11-20 MED ORDER — ONDANSETRON HCL 4 MG PO TABS: 4.0000 mg | ORAL_TABLET | Freq: Four times a day (QID) | ORAL | Status: DC | PRN

## 2017-11-20 MED ORDER — SUGAMMADEX SODIUM 200 MG/2ML IV SOLN
INTRAVENOUS | Status: DC | PRN
  Administered 2017-11-20: 140 mg via INTRAVENOUS

## 2017-11-20 MED ORDER — SODIUM CHLORIDE 0.9% FLUSH
3.0000 mL | Freq: Two times a day (BID) | INTRAVENOUS | Status: DC
  Administered 2017-11-20 (×2): 3 mL via INTRAVENOUS

## 2017-11-20 MED ORDER — LACTATED RINGERS IV SOLN
INTRAVENOUS | Status: DC
  Administered 2017-11-20 (×2): via INTRAVENOUS

## 2017-11-20 MED ORDER — DOXYCYCLINE HYCLATE 100 MG PO TABS
100.0000 mg | ORAL_TABLET | Freq: Two times a day (BID) | ORAL | Status: DC
  Administered 2017-11-20 (×2): 100 mg via ORAL
  Filled 2017-11-20 (×3): qty 1

## 2017-11-20 MED ORDER — BACITRACIN 50000 UNITS IM SOLR
INTRAMUSCULAR | Status: DC | PRN
  Administered 2017-11-20 (×2)

## 2017-11-20 MED ORDER — OXYCODONE HCL 5 MG PO TABS
ORAL_TABLET | ORAL | Status: AC
  Filled 2017-11-20: qty 1

## 2017-11-20 MED ORDER — CEFAZOLIN SODIUM-DEXTROSE 2-4 GM/100ML-% IV SOLN
2.0000 g | INTRAVENOUS | Status: DC
  Filled 2017-11-20: qty 100

## 2017-11-20 MED ORDER — ADULT MULTIVITAMIN W/MINERALS CH: 1.0000 | ORAL_TABLET | Freq: Every day | ORAL | Status: DC

## 2017-11-20 MED ORDER — THIAMINE HCL 100 MG PO TABS: 100.0000 mg | ORAL_TABLET | Freq: Every day | ORAL | Status: DC

## 2017-11-20 MED ORDER — ONDANSETRON HCL 4 MG/2ML IJ SOLN
INTRAMUSCULAR | Status: DC | PRN
  Administered 2017-11-20: 4 mg via INTRAVENOUS

## 2017-11-20 MED ORDER — QUETIAPINE FUMARATE 50 MG PO TABS
50.0000 mg | ORAL_TABLET | Freq: Every day | ORAL | Status: DC
  Administered 2017-11-20: 50 mg via ORAL
  Filled 2017-11-20: qty 1

## 2017-11-20 MED ORDER — MEPERIDINE HCL 50 MG/ML IJ SOLN: 6.2500 mg | INTRAMUSCULAR | Status: DC | PRN

## 2017-11-20 MED ORDER — SODIUM CHLORIDE 0.9 % IV SOLN: 250.0000 mL | INTRAVENOUS | Status: DC

## 2017-11-20 MED ORDER — MIDAZOLAM HCL 2 MG/2ML IJ SOLN
INTRAMUSCULAR | Status: AC
  Filled 2017-11-20: qty 2

## 2017-11-20 MED ORDER — OXYCODONE HCL 5 MG/5ML PO SOLN: 5.0000 mg | Freq: Once | ORAL | Status: AC | PRN

## 2017-11-20 MED ORDER — LEVOTHYROXINE SODIUM 112 MCG PO TABS
112.0000 ug | ORAL_TABLET | Freq: Every day | ORAL | Status: DC
  Administered 2017-11-21: 112 ug via ORAL
  Filled 2017-11-20: qty 1

## 2017-11-20 MED ORDER — ACETAMINOPHEN 160 MG/5ML PO SOLN: 325.0000 mg | ORAL | Status: DC | PRN

## 2017-11-20 MED ORDER — VANCOMYCIN HCL 1000 MG IV SOLR
INTRAVENOUS | Status: DC | PRN
  Administered 2017-11-20: 1000 mg via TOPICAL

## 2017-11-20 MED ORDER — LORAZEPAM 0.5 MG PO TABS: 2.0000 mg | ORAL_TABLET | Freq: Three times a day (TID) | ORAL | Status: DC

## 2017-11-20 MED ORDER — DEXAMETHASONE SODIUM PHOSPHATE 10 MG/ML IJ SOLN
10.0000 mg | INTRAMUSCULAR | Status: AC
  Administered 2017-11-20: 10 mg via INTRAVENOUS
  Filled 2017-11-20: qty 1

## 2017-11-20 SURGICAL SUPPLY — 48 items
BAG DECANTER FOR FLEXI CONT (MISCELLANEOUS) ×3 IMPLANT
BENZOIN TINCTURE PRP APPL 2/3 (GAUZE/BANDAGES/DRESSINGS) ×3 IMPLANT
BLADE CLIPPER SURG (BLADE) IMPLANT
BUR CUTTER 7.0 ROUND (BURR) ×3 IMPLANT
CANISTER SUCT 3000ML PPV (MISCELLANEOUS) ×3 IMPLANT
CARTRIDGE OIL MAESTRO DRILL (MISCELLANEOUS) ×1 IMPLANT
CLOSURE WOUND 1/2 X4 (GAUZE/BANDAGES/DRESSINGS) ×1
DECANTER SPIKE VIAL GLASS SM (MISCELLANEOUS) ×3 IMPLANT
DERMABOND ADVANCED (GAUZE/BANDAGES/DRESSINGS) ×2
DERMABOND ADVANCED .7 DNX12 (GAUZE/BANDAGES/DRESSINGS) ×1 IMPLANT
DIFFUSER DRILL AIR PNEUMATIC (MISCELLANEOUS) ×3 IMPLANT
DRAPE HALF SHEET 40X57 (DRAPES) IMPLANT
DRAPE LAPAROTOMY 100X72X124 (DRAPES) ×3 IMPLANT
DRAPE MICROSCOPE LEICA (MISCELLANEOUS) ×3 IMPLANT
DRAPE POUCH INSTRU U-SHP 10X18 (DRAPES) ×3 IMPLANT
DRAPE SURG 17X23 STRL (DRAPES) ×6 IMPLANT
DRSG OPSITE POSTOP 4X6 (GAUZE/BANDAGES/DRESSINGS) ×3 IMPLANT
ELECT REM PT RETURN 9FT ADLT (ELECTROSURGICAL) ×3
ELECTRODE REM PT RTRN 9FT ADLT (ELECTROSURGICAL) ×1 IMPLANT
GAUZE SPONGE 4X4 12PLY STRL (GAUZE/BANDAGES/DRESSINGS) ×3 IMPLANT
GAUZE SPONGE 4X4 16PLY XRAY LF (GAUZE/BANDAGES/DRESSINGS) IMPLANT
GLOVE ECLIPSE 7.5 STRL STRAW (GLOVE) ×9 IMPLANT
GLOVE ECLIPSE 9.0 STRL (GLOVE) ×3 IMPLANT
GLOVE EXAM NITRILE LRG STRL (GLOVE) IMPLANT
GLOVE EXAM NITRILE XL STR (GLOVE) IMPLANT
GLOVE EXAM NITRILE XS STR PU (GLOVE) IMPLANT
GOWN STRL REUS W/ TWL LRG LVL3 (GOWN DISPOSABLE) IMPLANT
GOWN STRL REUS W/ TWL XL LVL3 (GOWN DISPOSABLE) ×1 IMPLANT
GOWN STRL REUS W/TWL 2XL LVL3 (GOWN DISPOSABLE) IMPLANT
GOWN STRL REUS W/TWL LRG LVL3 (GOWN DISPOSABLE)
GOWN STRL REUS W/TWL XL LVL3 (GOWN DISPOSABLE) ×2
KIT BASIN OR (CUSTOM PROCEDURE TRAY) ×3 IMPLANT
KIT TURNOVER KIT B (KITS) ×3 IMPLANT
NEEDLE HYPO 22GX1.5 SAFETY (NEEDLE) ×3 IMPLANT
NEEDLE SPNL 22GX3.5 QUINCKE BK (NEEDLE) ×3 IMPLANT
NS IRRIG 1000ML POUR BTL (IV SOLUTION) ×3 IMPLANT
OIL CARTRIDGE MAESTRO DRILL (MISCELLANEOUS) ×3
PACK LAMINECTOMY NEURO (CUSTOM PROCEDURE TRAY) ×3 IMPLANT
PAD ARMBOARD 7.5X6 YLW CONV (MISCELLANEOUS) ×9 IMPLANT
RASP 3.0MM (RASP) ×3 IMPLANT
RUBBERBAND STERILE (MISCELLANEOUS) ×6 IMPLANT
SPONGE SURGIFOAM ABS GEL 100 (HEMOSTASIS) ×3 IMPLANT
STRIP CLOSURE SKIN 1/2X4 (GAUZE/BANDAGES/DRESSINGS) ×2 IMPLANT
SUT VIC AB 2-0 CT1 18 (SUTURE) ×3 IMPLANT
SUT VIC AB 3-0 SH 8-18 (SUTURE) ×3 IMPLANT
TOWEL GREEN STERILE (TOWEL DISPOSABLE) ×3 IMPLANT
TOWEL GREEN STERILE FF (TOWEL DISPOSABLE) ×3 IMPLANT
WATER STERILE IRR 1000ML POUR (IV SOLUTION) ×3 IMPLANT
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
## 2017-11-20 NOTE — Brief Op Note (Signed)
11/20/2017  9:31 AM  PATIENT:  [name]  57 y.o. male  PRE-OPERATIVE DIAGNOSIS:  Painful hardware  POST-OPERATIVE DIAGNOSIS:  Painful hardware  PROCEDURE:  Procedure(s): Re-exploration of fusion and removal of hardware, Lumbar two pedicle screws (N/A)  SURGEON:  Surgeon(s) and Role:    Earnie Larsson, MD - Primary  PHYSICIAN ASSISTANT:   ASSISTANTS:    ANESTHESIA:   general  EBL:  Minimal   BLOOD ADMINISTERED:none  DRAINS: none   LOCAL MEDICATIONS USED:  MARCAINE     SPECIMEN:  No Specimen  DISPOSITION OF SPECIMEN:  N/A  COUNTS:  YES  TOURNIQUET:  * No tourniquets in log *  DICTATION: .Dragon Dictation  PLAN OF CARE: Admit for overnight observation  PATIENT DISPOSITION:  PACU - hemodynamically stable.   Delay start of Pharmacological VTE agent (>24hrs) due to surgical blood loss or risk of bleeding: yes
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
## 2017-11-20 NOTE — Transfer of Care (Signed)
Immediate Anesthesia Transfer of Care Note  Patient: [name]  Procedure(s) Performed: Re-exploration of fusion and removal of hardware, Lumbar two pedicle screws (N/A Back)  Patient Location: PACU  Anesthesia Type:General  Level of Consciousness: awake, alert , oriented and patient cooperative  Airway & Oxygen Therapy: Patient Spontanous Breathing and Patient connected to face mask oxygen  Post-op Assessment: Report given to RN and Post -op Vital signs reviewed and stable  Post vital signs: Reviewed and stable  Last Vitals:  Vitals Value Taken Time  BP 163/89 11/20/2017  9:40 AM  Temp 36.4 C 11/20/2017  9:40 AM  Pulse 82 11/20/2017  9:41 AM  Resp 13 11/20/2017  9:41 AM  SpO2 100 % 11/20/2017  9:41 AM  Vitals shown include unvalidated device data.  Last Pain:  Vitals:   11/20/17 0711  TempSrc:   PainSc: 7       Patients Stated Pain Goal: 4 (63/33/54 [id number])  Complications: No apparent anesthesia complications

## 2017-11-20 NOTE — Anesthesia Postprocedure Evaluation (Signed)
Anesthesia Post Note  Patient: [name]  Procedure(s) Performed: Re-exploration of fusion and removal of hardware, Lumbar two pedicle screws (N/A Back)     Patient location during evaluation: PACU Anesthesia Type: General Level of consciousness: awake and alert Pain management: pain level controlled Vital Signs Assessment: post-procedure vital signs reviewed and stable Respiratory status: spontaneous breathing, nonlabored ventilation, respiratory function stable and patient connected to nasal cannula oxygen Cardiovascular status: blood pressure returned to baseline and stable Postop Assessment: no apparent nausea or vomiting Anesthetic complications: no    Last Vitals:  Vitals:   11/20/17 1029 11/20/17 1046  BP: (!) 149/70 (!) 156/90  Pulse: 65 74  Resp: 14 17  Temp: (!) 36.4 C 36.9 C  SpO2: 95% 100%    Last Pain:  Vitals:   11/20/17 1046  TempSrc: Oral  PainSc:                  Raegan Sipp

## 2017-11-20 NOTE — Anesthesia Procedure Notes (Signed)
Procedure Name: Intubation Date/Time: 11/20/2017 8:27 AM Performed by: Cleda Daub, CRNA Pre-anesthesia Checklist: Patient identified, Emergency Drugs available, Suction available and Patient being monitored Patient Re-evaluated:Patient Re-evaluated prior to induction Oxygen Delivery Method: Circle system utilized Preoxygenation: Pre-oxygenation with 100% oxygen Induction Type: IV induction, Rapid sequence and Cricoid Pressure applied Laryngoscope Size: Mac Grade View: Grade I Tube type: Oral Tube size: 8.0 mm Number of attempts: 1 Airway Equipment and Method: Stylet Placement Confirmation: ETT inserted through vocal cords under direct vision,  positive ETCO2 and breath sounds checked- equal and bilateral Secured at: 23 cm Tube secured with: Tape Dental Injury: Teeth and Oropharynx as per pre-operative assessment

## 2017-11-20 NOTE — Progress Notes (Signed)
Dr. Ambrose Pancoast is aware that the patient ate a hot dog at 0200 am this morning
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
## 2017-11-21 ENCOUNTER — Encounter (HOSPITAL_COMMUNITY): Payer: Self-pay | Admitting: Neurosurgery

## 2017-11-21 DIAGNOSIS — T84226A Displacement of internal fixation device of vertebrae, initial encounter: Secondary | ICD-10-CM | POA: Diagnosis not present

## 2017-11-21 MED ORDER — CYCLOBENZAPRINE HCL 10 MG PO TABS: 10.0000 mg | ORAL_TABLET | Freq: Three times a day (TID) | ORAL | 0 refills | Status: DC | PRN

## 2017-11-21 MED ORDER — OXYCODONE-ACETAMINOPHEN 10-325 MG PO TABS: 1.0000 | ORAL_TABLET | Freq: Four times a day (QID) | ORAL | 0 refills | Status: DC | PRN

## 2017-11-21 NOTE — Evaluation (Signed)
Occupational Therapy Evaluation Patient Details Name: [name] MRN: [id number] DOB: [birth year] Today's Date: 11/21/2017    History of Present Illness Reexploration of L1-L2 posterior lateral fusion with removal of the loosened, painful hardware at L2.   Clinical Impression   Patient evaluated by Occupational Therapy with no further acute OT needs identified. All education has been completed and the patient has no further questions.Education completed.  Pt for discharge home.  Recommmend HHOT and PT, which pt declined.   See below for any follow-up Occupational Therapy or equipment needs. OT is signing off. Thank you for this referral.      Follow Up Recommendations  Home health OT(HHPT)    Equipment Recommendations  None recommended by OT    Recommendations for Other Services       Precautions / Restrictions Precautions Precautions: Back Precaution Booklet Issued: Yes (comment) Precaution Comments: pt only able to recall one back precautions.  Written information provided and reviewed extensively with pt.  He requires max cues to adhere to precautions. He repeatedly twisted despite multiple attempts at explanation       Mobility Bed Mobility Overal bed mobility: Needs Assistance Bed Mobility: Rolling;Sidelying to Sit Rolling: Supervision Sidelying to sit: Supervision       General bed mobility comments: Pt instructed in log rolling   Transfers Overall transfer level: Needs assistance   Transfers: Sit to/from Stand;Stand Pivot Transfers Sit to Stand: Min guard Stand pivot transfers: Min guard       General transfer comment: unsteady     Balance Overall balance assessment: Mild deficits observed, not formally tested                                         ADL either performed or assessed with clinical judgement   ADL Overall ADL's : Needs assistance/impaired Eating/Feeding: Independent   Grooming: Wash/dry hands;Wash/dry face;Oral  care;Brushing hair;Min guard;Standing Grooming Details (indicate cue type and reason): reviewed correct method for shaving and brushing teeth  Upper Body Bathing: Supervision/ safety;Sitting   Lower Body Bathing: Min guard;Sit to/from stand Lower Body Bathing Details (indicate cue type and reason): Pt reports he has a long handled sponge  Upper Body Dressing : Supervision/safety;Sitting Upper Body Dressing Details (indicate cue type and reason): Pt repeatedly twists to gather clothing despite repeated explanation of precautions  Lower Body Dressing: Min guard;Sit to/from stand Lower Body Dressing Details (indicate cue type and reason): able to cross ankles over knees  Toilet Transfer: Minimal assistance;Ambulation;Comfort height toilet;Grab bars   Toileting- Clothing Manipulation and Hygiene: Min guard;Sit to/from Nurse, children's Details (indicate cue type and reason): Pt able to verbalize how to use tub bench correctly  Functional mobility during ADLs: Minimal assistance;Min guard;Rolling walker General ADL Comments: Pt unsteady on his feet      Vision         Perception     Praxis      Pertinent Vitals/Pain Pain Assessment: 0-10 Pain Score: 7  Pain Location: back Pain Descriptors / Indicators: Operative site guarding Pain Intervention(s): Monitored during session     Hand Dominance Right   Extremity/Trunk Assessment Upper Extremity Assessment Upper Extremity Assessment: Overall WFL for tasks assessed           Communication Communication Communication: No difficulties   Cognition Arousal/Alertness: Awake/alert Behavior During Therapy: WFL for tasks assessed/performed  General Comments: appears to be at baseline, but requires max cues to adhere to back precautions    General Comments  girlfriend, Otila Kluver in room, appears lethargic, she was noted to stumble several times while negotiating obstacles in  room.   Pt noted to be unsteady, veering to Right when ambulating.  RN reports pt's gait was not unsteady last pm.  Pt and girlfriend eager to discharge. Recommended HHOT and PT to pt, however, he declined recommendation.  Pt very fixated on location of his cigarettes     Exercises     Shoulder Instructions      Home Living Family/patient expects to be discharged to:: Private residence Living Arrangements: Other relatives Available Help at Discharge: Family;Available 24 hours/day;Friend(s)               Bathroom Shower/Tub: Tub/shower unit;Curtain   Bathroom Toilet: Handicapped height     Home Equipment: Environmental consultant - 2 wheels;Cane - single point;Bedside commode;Tub bench   Additional Comments: Pt reports he will be living with sister       Prior Functioning/Environment Level of Independence: Independent        Comments: Pt reports he uses cans at times         OT Problem List: Decreased strength;Impaired balance (sitting and/or standing);Decreased safety awareness;Decreased knowledge of use of DME or AE;Cardiopulmonary status limiting activity;Pain      OT Treatment/Interventions:      OT Goals(Current goals can be found in the care plan section) Acute Rehab OT Goals Patient Stated Goal: to go home as soon as able  OT Goal Formulation: All assessment and education complete, DC therapy  OT Frequency:     Barriers to D/C:            Co-evaluation              AM-PAC PT "6 Clicks" Daily Activity     Outcome Measure Help from another person eating meals?: None Help from another person taking care of personal grooming?: A Little Help from another person toileting, which includes using toliet, bedpan, or urinal?: A Little Help from another person bathing (including washing, rinsing, drying)?: A Little Help from another person to put on and taking off regular upper body clothing?: A Little Help from another person to put on and taking off regular lower body  clothing?: A Little 6 Click Score: 19   End of Session Equipment Utilized During Treatment: Rolling walker;Gait belt Nurse Communication: Mobility status  Activity Tolerance: Patient tolerated treatment well Patient left: in bed;with bed alarm set;with family/visitor present  OT Visit Diagnosis: Unsteadiness on feet (R26.81)                Time: 3614-4315 OT Time Calculation (min): 45 min Charges:  OT Evaluation $OT Eval Low Complexity: 1 Low  [place], OTR/L [phone]   Lucille Passy M 11/21/2017, 7:27 PM

## 2017-11-21 NOTE — Discharge Summary (Signed)
Physician Discharge Summary  Patient ID: [name] MRN: [id number] DOB/AGE: Jul 16, 1960 57 y.o.  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 The patient tolerated the procedure well and was taken to the recovery room and then to the floor in stable condition. The hospital course was routine. There were no complications. The wound remained clean dry and intact. Pt had appropriate back soreness. No complaints of leg pain or new N/T/W. The patient remained afebrile with stable vital signs, and tolerated a regular diet. The patient continued to increase activities, and pain was well controlled with oral pain medications.   Consults: None  Significant Diagnostic Studies:  Results for orders placed or performed during the hospital encounter of 11/20/17  CBC WITH DIFFERENTIAL  Result Value Ref Range   WBC 10.3 4.0 - 10.5 K/uL   RBC 3.83 (L) 4.22 - 5.81 MIL/uL   Hemoglobin 11.9 (L) 13.0 - 17.0 g/dL   HCT 36.1 (L) 39.0 - 52.0 %   MCV 94.3 78.0 - 100.0 fL   MCH 31.1 26.0 - 34.0 pg   MCHC 33.0 30.0 - 36.0 g/dL   RDW 14.0 11.5 - 15.5 %   Platelets 183 150 - 400 K/uL   Neutrophils Relative % 73 %   Neutro Abs 7.6 1.7 - 7.7 K/uL   Lymphocytes Relative 12 %   Lymphs Abs 1.2 0.7 - 4.0 K/uL   Monocytes Relative 10 %   Monocytes Absolute 1.0 0.1 - 1.0 K/uL   Eosinophils Relative 2 %   Eosinophils Absolute 0.2 0.0 - 0.7 K/uL   Basophils Relative 1 %   Basophils Absolute 0.1 0.0 - 0.1 K/uL   Immature Granulocytes 2 %   Abs Immature Granulocytes 0.2 (H) 0.0 - 0.1 K/uL    No results found.  Antibiotics:  Anti-infectives (From admission, onward)   Start     Dose/Rate Route  Frequency Ordered Stop   11/20/17 1600  ceFAZolin (ANCEF) IVPB 1 g/50 mL premix     1 g 100 mL/hr over 30 Minutes Intravenous Every 8 hours 11/20/17 1050 11/20/17 2256   11/20/17 1200  doxycycline (VIBRA-TABS) tablet 100 mg     100 mg Oral 2 times daily 11/20/17 1050     11/20/17 0901  vancomycin (VANCOCIN) powder  Status:  Discontinued       As needed 11/20/17 0902 11/20/17 0934   11/20/17 0813  bacitracin 50,000 Units in sodium chloride 0.9 % 500 mL irrigation  Status:  Discontinued       As needed 11/20/17 0813 11/20/17 0934   11/20/17 0715  ceFAZolin (ANCEF) IVPB 2g/100 mL premix  Status:  Discontinued     2 g 200 mL/hr over 30 Minutes Intravenous On call to O.R. 11/20/17 0701 11/20/17 1049      Discharge Exam: Blood pressure 125/64, pulse 60, temperature 97.6 F (36.4 C), temperature source Oral, resp. rate 17, height 5\' 10"  (1.778 m), weight 89.8 kg (198 lb), SpO2 100 %. Neurologic: Grossly normal Ambulating and voiding well  Discharge Medications:   Allergies as of 11/21/2017      Reactions   Amitriptyline Swelling, Other (See Comments)   Swelling in legs   Acetaminophen Other (See Comments)   <2 g/day  due to cirrhosis of liver   Hydrocodone Nausea And Vomiting   Motrin [ibuprofen] Other (See Comments)   Cannot take due to cirrhosis of liver   Hydrocodone Nausea Only   Motrin [ibuprofen] Other (See Comments)   Avoids due to liver cirrhosis.   Tylenol [acetaminophen] Other (See Comments)   <2g / day due to cirrhosis      Medication List    TAKE these medications   baclofen 10 MG tablet Commonly known as:  LIORESAL Take 1 tablet (10 mg total) by mouth every 8 (eight) hours as needed for muscle spasms.   cyclobenzaprine 10 MG tablet Commonly known as:  FLEXERIL Take 1 tablet (10 mg total) by mouth 3 (three) times daily as needed for muscle spasms.   doxycycline 100 MG capsule Commonly known as:  VIBRAMYCIN Take 1 capsule (100 mg total) 2 (two) times daily by  mouth.   folic acid 1 MG tablet Commonly known as:  FOLVITE Take 1 tablet (1 mg total) by mouth daily.   furosemide 40 MG tablet Commonly known as:  LASIX Take 0.5 tablets (20 mg total) by mouth daily.   levothyroxine 112 MCG tablet Commonly known as:  SYNTHROID, LEVOTHROID Take 112 mcg by mouth daily before breakfast.   linaclotide 290 MCG Caps capsule Commonly known as:  LINZESS Take 1 capsule (290 mcg total) by mouth daily before breakfast.   LORazepam 2 MG tablet Commonly known as:  ATIVAN Take 2 mg by mouth 3 (three) times daily.   multivitamin with minerals Tabs tablet Take 1 tablet by mouth daily.   nicotine 14 mg/24hr patch Commonly known as:  NICODERM CQ - dosed in mg/24 hours Place 1 patch (14 mg total) onto the skin daily.   Oxycodone HCl 10 MG Tabs Take 10 mg by mouth every 4 (four) hours. Do not exceed more than 5 tablets daily   oxyCODONE-acetaminophen 10-325 MG tablet Commonly known as:  PERCOCET Take 1 tablet by mouth every 6 (six) hours as needed for pain.   potassium chloride 10 MEQ tablet Commonly known as:  K-DUR Take 1 tablet (10 mEq total) by mouth daily.   QUEtiapine 50 MG tablet Commonly known as:  SEROQUEL Take 50 mg by mouth at bedtime.   rifaximin 550 MG Tabs tablet Commonly known as:  XIFAXAN Take 1 tablet (550 mg total) by mouth 2 (two) times daily.   spironolactone 50 MG tablet Commonly known as:  ALDACTONE Take 1 tablet (50 mg total) by mouth 2 (two) times daily.   thiamine 100 MG tablet Take 1 tablet (100 mg total) by mouth daily.   zolpidem 5 MG tablet Commonly known as:  AMBIEN Take 1 tablet (5 mg total) by mouth at bedtime as needed for sleep.       Disposition: home   Final Dx: posterior lat fusion L1-2  Discharge Instructions     Remove dressing in 72 hours   Complete by:  As directed    Call MD for:  difficulty breathing, headache or visual disturbances   Complete by:  As directed    Call MD for:  extreme  fatigue   Complete by:  As directed    Call MD for:  hives   Complete by:  As directed    Call MD for:  persistant dizziness or light-headedness   Complete by:  As directed    Call MD for:  persistant nausea and vomiting   Complete by:  As directed    Call MD  for:  redness, tenderness, or signs of infection (pain, swelling, redness, odor or green/yellow discharge around incision site)   Complete by:  As directed    Call MD for:  severe uncontrolled pain   Complete by:  As directed    Call MD for:  temperature >100.4   Complete by:  As directed    Diet - low sodium heart healthy   Complete by:  As directed    Driving Restrictions   Complete by:  As directed    No driving 2 weeks   Increase activity slowly   Complete by:  As directed       Follow-up Information    Earnie Larsson, MD. Schedule an appointment as soon as possible for a visit in 2 week(s).   Specialty:  Neurosurgery Contact information: [address] [place] [address] [phone]            Signed: Ocie Cornfield [facility] 11/21/2017, 8:04 AM

## 2017-11-23 MED FILL — Thrombin For Soln 20000 Unit: CUTANEOUS | Qty: 1 | Status: AC

## 2017-12-30 ENCOUNTER — Other Ambulatory Visit: Payer: Self-pay

## 2017-12-30 ENCOUNTER — Encounter: Payer: Self-pay | Admitting: Radiation Oncology

## 2017-12-30 ENCOUNTER — Ambulatory Visit
Admission: RE | Admit: 2017-12-30 | Discharge: 2017-12-30 | Disposition: A | Discharge status: Home / Self Care | Payer: Medicaid Other | Source: Ambulatory Visit | Attending: Radiation Oncology | Admitting: Radiation Oncology

## 2017-12-30 VITALS — BP 155/82 | HR 59 | Temp 98.9°F | Resp 20 | Ht 70.0 in | Wt 193.0 lb

## 2017-12-30 DIAGNOSIS — Z886 Allergy status to analgesic agent status: Secondary | ICD-10-CM | POA: Insufficient documentation

## 2017-12-30 DIAGNOSIS — C61 Malignant neoplasm of prostate: Secondary | ICD-10-CM | POA: Diagnosis not present

## 2017-12-30 DIAGNOSIS — Z79899 Other long term (current) drug therapy: Secondary | ICD-10-CM | POA: Insufficient documentation

## 2017-12-30 DIAGNOSIS — F17210 Nicotine dependence, cigarettes, uncomplicated: Secondary | ICD-10-CM | POA: Diagnosis not present

## 2017-12-30 DIAGNOSIS — K7460 Unspecified cirrhosis of liver: Secondary | ICD-10-CM | POA: Insufficient documentation

## 2017-12-30 DIAGNOSIS — Z8546 Personal history of malignant neoplasm of prostate: Secondary | ICD-10-CM | POA: Insufficient documentation

## 2017-12-30 DIAGNOSIS — E039 Hypothyroidism, unspecified: Secondary | ICD-10-CM | POA: Insufficient documentation

## 2017-12-30 DIAGNOSIS — Z923 Personal history of irradiation: Secondary | ICD-10-CM | POA: Insufficient documentation

## 2017-12-30 DIAGNOSIS — E0590 Thyrotoxicosis, unspecified without thyrotoxic crisis or storm: Secondary | ICD-10-CM | POA: Diagnosis not present

## 2017-12-30 DIAGNOSIS — Z9079 Acquired absence of other genital organ(s): Secondary | ICD-10-CM | POA: Diagnosis not present

## 2017-12-30 DIAGNOSIS — Z885 Allergy status to narcotic agent status: Secondary | ICD-10-CM | POA: Diagnosis not present
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
## 2018-01-04 NOTE — Progress Notes (Signed)
  Radiation Oncology         [phone] ________________________________  Name: [name] MRN: [id number]  Date: 01/05/2018  DOB: [birth year]  SIMULATION AND TREATMENT PLANNING NOTE    ICD-10-CM   1. 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  Identity was confirmed.  All relevant records and images related to the planned course of therapy were reviewed.  The patient freely provided informed written consent to proceed with treatment after reviewing the details related to the planned course of therapy. The consent form was witnessed and verified by the simulation staff.  Then, the patient was set-up in a stable reproducible supine position for radiation therapy.  A vacuum lock pillow device was custom fabricated to position his legs in a reproducible immobilized position.  Then, I performed a urethrogram under sterile conditions to identify the prostatic bed.  CT images were obtained.  Surface markings were placed.  The CT images were loaded into the planning software.  Then the prostate bed target, pelvic lymph node target and avoidance structures including the rectum, bladder, bowel and hips were contoured.  Treatment planning then occurred.  The radiation prescription was entered and confirmed.  A total of one complex treatment devices were fabricated. I have requested : Intensity Modulated Radiotherapy (IMRT) is medically necessary for this case for the following reason:  Rectal sparing.Marland Kitchen  SPECIAL TREATMENT PROCEDURE:  The planned course of therapy using radiation constitutes a special treatment procedure. Special care is required in the management of this patient for the following reasons. This treatment constitutes a Special Treatment Procedure for the following reason: [  Retreatment in a previously radiated area requiring careful monitoring of increased risk of toxicity due to overlap of previous treatment..  The special nature of the planned course of radiotherapy will require increased physician supervision and oversight to ensure patient's safety with optimal treatment outcomes. Previous seminoma radiation and truncated prostate radiation.   PLAN:  The patient will receive 68.4 Gy in 38 fractions of 1.8 Gy   ________________________________  Sheral Apley. Tammi Klippel, M.D.  This document serves as a record of services personally performed by Tyler Pita, MD. It was created on his behalf by Wilburn Mylar, a trained medical scribe. The creation of this record is based on the scribe's personal observations and the provider's statements to them. This document has been checked and approved by the attending provider.

## 2018-01-05 ENCOUNTER — Other Ambulatory Visit: Payer: Self-pay | Admitting: Urology

## 2018-01-05 ENCOUNTER — Ambulatory Visit
Admission: RE | Admit: 2018-01-05 | Discharge: 2018-01-05 | Disposition: A | Discharge status: Home / Self Care | Payer: Medicaid Other | Source: Ambulatory Visit | Attending: Radiation Oncology | Admitting: Radiation Oncology

## 2018-01-05 DIAGNOSIS — C61 Malignant neoplasm of prostate: Secondary | ICD-10-CM | POA: Diagnosis not present

## 2018-01-05 DIAGNOSIS — Z510 Encounter for antineoplastic radiation therapy: Secondary | ICD-10-CM | POA: Diagnosis not present

## 2018-01-05 MED ORDER — ONDANSETRON HCL 4 MG PO TABS: 4.0000 mg | ORAL_TABLET | Freq: Three times a day (TID) | ORAL | 1 refills | Status: DC | PRN

## 2018-01-13 DIAGNOSIS — Z510 Encounter for antineoplastic radiation therapy: Secondary | ICD-10-CM | POA: Diagnosis not present

## 2018-01-14 ENCOUNTER — Ambulatory Visit
Admission: RE | Admit: 2018-01-14 | Discharge: 2018-01-14 | Disposition: A | Discharge status: Home / Self Care | Payer: Medicaid Other | Source: Ambulatory Visit | Attending: Radiation Oncology | Admitting: Radiation Oncology

## 2018-01-14 DIAGNOSIS — Z510 Encounter for antineoplastic radiation therapy: Secondary | ICD-10-CM | POA: Diagnosis not present

## 2018-01-14 MED FILL — ONDANSETRON HCL 4 MG TABLET: 4 | 10 days supply | Qty: 30 | Fill #0

## 2018-01-15 ENCOUNTER — Ambulatory Visit: Payer: Medicaid Other

## 2018-01-15 ENCOUNTER — Ambulatory Visit
Admission: RE | Admit: 2018-01-15 | Discharge: 2018-01-15 | Disposition: A | Discharge status: Home / Self Care | Payer: Medicaid Other | Source: Ambulatory Visit | Attending: Radiation Oncology | Admitting: Radiation Oncology

## 2018-01-15 DIAGNOSIS — Z510 Encounter for antineoplastic radiation therapy: Secondary | ICD-10-CM | POA: Diagnosis not present

## 2018-01-15 NOTE — Progress Notes (Signed)
Pt here for patient teaching.  Pt given Radiation and You booklet.  Reviewed areas of pertinence such as diarrhea, fatigue and urinary and bladder changes . Pt able to give teach back of have Imodium on hand and drink plenty of water,. Pt demonstrated understanding, needs reinforcement, no evidence of learning, refused teaching and  of information given and will contact nursing with any questions or concerns.     Http://[email or web address]/treatmentinformation/whattoexpect/index      

## 2018-01-18 ENCOUNTER — Ambulatory Visit
Admission: RE | Admit: 2018-01-18 | Discharge: 2018-01-18 | Disposition: A | Discharge status: Home / Self Care | Payer: Medicaid Other | Source: Ambulatory Visit | Attending: Radiation Oncology | Admitting: Radiation Oncology

## 2018-01-18 ENCOUNTER — Encounter: Payer: Self-pay | Admitting: Medical Oncology

## 2018-01-18 DIAGNOSIS — Z510 Encounter for antineoplastic radiation therapy: Secondary | ICD-10-CM | POA: Diagnosis not present

## 2018-01-18 NOTE — Progress Notes (Signed)
I met Mr. Javid in fall of 2017. He started radiation 04/2016 but due to head injury he had to stop. He was referred back to Dr. Tammi Klippel and restarted radiation 9/19. I spoke with him today to reintroduce myself and my role. I will continue to follow and asked him to call with questions or concerns.

## 2018-01-19 ENCOUNTER — Ambulatory Visit
Admission: RE | Admit: 2018-01-19 | Discharge: 2018-01-19 | Disposition: A | Discharge status: Home / Self Care | Payer: Medicaid Other | Source: Ambulatory Visit | Attending: Radiation Oncology | Admitting: Radiation Oncology

## 2018-01-19 DIAGNOSIS — Z510 Encounter for antineoplastic radiation therapy: Secondary | ICD-10-CM | POA: Diagnosis not present

## 2018-01-20 ENCOUNTER — Ambulatory Visit
Admission: RE | Admit: 2018-01-20 | Discharge: 2018-01-20 | Disposition: A | Discharge status: Home / Self Care | Payer: Medicaid Other | Source: Ambulatory Visit | Attending: Radiation Oncology | Admitting: Radiation Oncology

## 2018-01-20 DIAGNOSIS — Z510 Encounter for antineoplastic radiation therapy: Secondary | ICD-10-CM | POA: Diagnosis not present

## 2018-01-21 ENCOUNTER — Ambulatory Visit
Admission: RE | Admit: 2018-01-21 | Discharge: 2018-01-21 | Disposition: A | Discharge status: Home / Self Care | Payer: Medicaid Other | Source: Ambulatory Visit | Attending: Radiation Oncology | Admitting: Radiation Oncology

## 2018-01-21 DIAGNOSIS — Z510 Encounter for antineoplastic radiation therapy: Secondary | ICD-10-CM | POA: Diagnosis not present

## 2018-01-22 ENCOUNTER — Ambulatory Visit
Admission: RE | Admit: 2018-01-22 | Discharge: 2018-01-22 | Disposition: A | Discharge status: Home / Self Care | Payer: Medicaid Other | Source: Ambulatory Visit | Attending: Radiation Oncology | Admitting: Radiation Oncology

## 2018-01-22 DIAGNOSIS — Z510 Encounter for antineoplastic radiation therapy: Secondary | ICD-10-CM | POA: Diagnosis not present

## 2018-01-25 ENCOUNTER — Ambulatory Visit
Admission: RE | Admit: 2018-01-25 | Discharge: 2018-01-25 | Disposition: A | Discharge status: Home / Self Care | Payer: Medicaid Other | Source: Ambulatory Visit | Attending: Radiation Oncology | Admitting: Radiation Oncology

## 2018-01-25 DIAGNOSIS — Z510 Encounter for antineoplastic radiation therapy: Secondary | ICD-10-CM | POA: Diagnosis not present

## 2018-01-26 ENCOUNTER — Ambulatory Visit
Admission: RE | Admit: 2018-01-26 | Discharge: 2018-01-26 | Disposition: A | Discharge status: Home / Self Care | Payer: Medicaid Other | Source: Ambulatory Visit | Attending: Radiation Oncology | Admitting: Radiation Oncology

## 2018-01-26 DIAGNOSIS — Z510 Encounter for antineoplastic radiation therapy: Secondary | ICD-10-CM | POA: Insufficient documentation

## 2018-01-26 DIAGNOSIS — C61 Malignant neoplasm of prostate: Secondary | ICD-10-CM | POA: Diagnosis not present

## 2018-01-27 ENCOUNTER — Ambulatory Visit
Admission: RE | Admit: 2018-01-27 | Discharge: 2018-01-27 | Disposition: A | Discharge status: Home / Self Care | Payer: Medicaid Other | Source: Ambulatory Visit | Attending: Radiation Oncology | Admitting: Radiation Oncology

## 2018-01-27 DIAGNOSIS — Z510 Encounter for antineoplastic radiation therapy: Secondary | ICD-10-CM | POA: Diagnosis not present

## 2018-01-28 ENCOUNTER — Ambulatory Visit
Admission: RE | Admit: 2018-01-28 | Discharge: 2018-01-28 | Disposition: A | Discharge status: Home / Self Care | Payer: Medicaid Other | Source: Ambulatory Visit | Attending: Radiation Oncology | Admitting: Radiation Oncology

## 2018-01-28 DIAGNOSIS — Z510 Encounter for antineoplastic radiation therapy: Secondary | ICD-10-CM | POA: Diagnosis not present

## 2018-01-29 ENCOUNTER — Ambulatory Visit: Payer: Medicaid Other

## 2018-02-01 ENCOUNTER — Ambulatory Visit
Admission: RE | Admit: 2018-02-01 | Discharge: 2018-02-01 | Disposition: A | Discharge status: Home / Self Care | Payer: Medicaid Other | Source: Ambulatory Visit | Attending: Radiation Oncology | Admitting: Radiation Oncology

## 2018-02-01 DIAGNOSIS — Z510 Encounter for antineoplastic radiation therapy: Secondary | ICD-10-CM | POA: Diagnosis not present

## 2018-02-02 ENCOUNTER — Ambulatory Visit
Admission: RE | Admit: 2018-02-02 | Discharge: 2018-02-02 | Disposition: A | Discharge status: Home / Self Care | Payer: Medicaid Other | Source: Ambulatory Visit | Attending: Radiation Oncology | Admitting: Radiation Oncology

## 2018-02-02 DIAGNOSIS — Z510 Encounter for antineoplastic radiation therapy: Secondary | ICD-10-CM | POA: Diagnosis not present

## 2018-02-03 ENCOUNTER — Ambulatory Visit
Admission: RE | Admit: 2018-02-03 | Discharge: 2018-02-03 | Disposition: A | Discharge status: Home / Self Care | Payer: Medicaid Other | Source: Ambulatory Visit | Attending: Radiation Oncology | Admitting: Radiation Oncology

## 2018-02-03 DIAGNOSIS — Z510 Encounter for antineoplastic radiation therapy: Secondary | ICD-10-CM | POA: Diagnosis not present

## 2018-02-04 ENCOUNTER — Ambulatory Visit
Admission: RE | Admit: 2018-02-04 | Discharge: 2018-02-04 | Disposition: A | Discharge status: Home / Self Care | Payer: Medicaid Other | Source: Ambulatory Visit | Attending: Radiation Oncology | Admitting: Radiation Oncology

## 2018-02-04 DIAGNOSIS — Z510 Encounter for antineoplastic radiation therapy: Secondary | ICD-10-CM | POA: Diagnosis not present

## 2018-02-05 ENCOUNTER — Ambulatory Visit
Admission: RE | Admit: 2018-02-05 | Discharge: 2018-02-05 | Disposition: A | Discharge status: Home / Self Care | Payer: Medicaid Other | Source: Ambulatory Visit | Attending: Radiation Oncology | Admitting: Radiation Oncology

## 2018-02-05 DIAGNOSIS — Z510 Encounter for antineoplastic radiation therapy: Secondary | ICD-10-CM | POA: Diagnosis not present

## 2018-02-08 ENCOUNTER — Ambulatory Visit
Admission: RE | Admit: 2018-02-08 | Discharge: 2018-02-08 | Disposition: A | Discharge status: Home / Self Care | Payer: Medicaid Other | Source: Ambulatory Visit | Attending: Radiation Oncology | Admitting: Radiation Oncology

## 2018-02-08 DIAGNOSIS — Z510 Encounter for antineoplastic radiation therapy: Secondary | ICD-10-CM | POA: Diagnosis not present

## 2018-02-09 ENCOUNTER — Ambulatory Visit
Admission: RE | Admit: 2018-02-09 | Discharge: 2018-02-09 | Disposition: A | Discharge status: Home / Self Care | Payer: Medicaid Other | Source: Ambulatory Visit | Attending: Radiation Oncology | Admitting: Radiation Oncology

## 2018-02-09 DIAGNOSIS — Z510 Encounter for antineoplastic radiation therapy: Secondary | ICD-10-CM | POA: Diagnosis not present

## 2018-02-10 ENCOUNTER — Ambulatory Visit
Admission: RE | Admit: 2018-02-10 | Discharge: 2018-02-10 | Disposition: A | Discharge status: Home / Self Care | Payer: Medicaid Other | Source: Ambulatory Visit | Attending: Radiation Oncology | Admitting: Radiation Oncology

## 2018-02-10 DIAGNOSIS — Z510 Encounter for antineoplastic radiation therapy: Secondary | ICD-10-CM | POA: Diagnosis not present

## 2018-02-11 ENCOUNTER — Encounter: Payer: Self-pay | Admitting: Medical Oncology

## 2018-02-11 ENCOUNTER — Ambulatory Visit
Admission: RE | Admit: 2018-02-11 | Discharge: 2018-02-11 | Disposition: A | Discharge status: Home / Self Care | Payer: Medicaid Other | Source: Ambulatory Visit | Attending: Radiation Oncology | Admitting: Radiation Oncology

## 2018-02-11 DIAGNOSIS — Z510 Encounter for antineoplastic radiation therapy: Secondary | ICD-10-CM | POA: Diagnosis not present

## 2018-02-12 ENCOUNTER — Ambulatory Visit: Payer: Medicaid Other

## 2018-02-15 ENCOUNTER — Ambulatory Visit: Payer: Medicaid Other

## 2018-02-16 ENCOUNTER — Ambulatory Visit
Admission: RE | Admit: 2018-02-16 | Discharge: 2018-02-16 | Disposition: A | Discharge status: Home / Self Care | Payer: Medicaid Other | Source: Ambulatory Visit | Attending: Radiation Oncology | Admitting: Radiation Oncology

## 2018-02-16 DIAGNOSIS — Z510 Encounter for antineoplastic radiation therapy: Secondary | ICD-10-CM | POA: Diagnosis not present

## 2018-02-17 ENCOUNTER — Ambulatory Visit
Admission: RE | Admit: 2018-02-17 | Discharge: 2018-02-17 | Disposition: A | Discharge status: Home / Self Care | Payer: Medicaid Other | Source: Ambulatory Visit | Attending: Radiation Oncology | Admitting: Radiation Oncology

## 2018-02-17 DIAGNOSIS — Z510 Encounter for antineoplastic radiation therapy: Secondary | ICD-10-CM | POA: Diagnosis not present

## 2018-02-18 ENCOUNTER — Ambulatory Visit
Admission: RE | Admit: 2018-02-18 | Discharge: 2018-02-18 | Disposition: A | Discharge status: Home / Self Care | Payer: Medicaid Other | Source: Ambulatory Visit | Attending: Radiation Oncology | Admitting: Radiation Oncology

## 2018-02-18 DIAGNOSIS — Z510 Encounter for antineoplastic radiation therapy: Secondary | ICD-10-CM | POA: Diagnosis not present

## 2018-02-19 ENCOUNTER — Ambulatory Visit: Payer: Medicaid Other

## 2018-02-22 ENCOUNTER — Ambulatory Visit
Admission: RE | Admit: 2018-02-22 | Discharge: 2018-02-22 | Disposition: A | Discharge status: Home / Self Care | Payer: Medicaid Other | Source: Ambulatory Visit | Attending: Radiation Oncology | Admitting: Radiation Oncology

## 2018-02-22 DIAGNOSIS — Z510 Encounter for antineoplastic radiation therapy: Secondary | ICD-10-CM | POA: Diagnosis not present

## 2018-02-23 ENCOUNTER — Ambulatory Visit
Admission: RE | Admit: 2018-02-23 | Discharge: 2018-02-23 | Disposition: A | Discharge status: Home / Self Care | Payer: Medicaid Other | Source: Ambulatory Visit | Attending: Radiation Oncology | Admitting: Radiation Oncology

## 2018-02-23 ENCOUNTER — Other Ambulatory Visit: Payer: Self-pay | Admitting: Urology

## 2018-02-23 DIAGNOSIS — Z510 Encounter for antineoplastic radiation therapy: Secondary | ICD-10-CM | POA: Diagnosis not present

## 2018-02-23 MED ORDER — LORAZEPAM 1 MG PO TABS: 1.0000 mg | ORAL_TABLET | Freq: Every day | ORAL | 0 refills | Status: DC

## 2018-02-24 ENCOUNTER — Ambulatory Visit
Admission: RE | Admit: 2018-02-24 | Discharge: 2018-02-24 | Disposition: A | Discharge status: Home / Self Care | Payer: Medicaid Other | Source: Ambulatory Visit | Attending: Radiation Oncology | Admitting: Radiation Oncology

## 2018-02-24 DIAGNOSIS — Z510 Encounter for antineoplastic radiation therapy: Secondary | ICD-10-CM | POA: Diagnosis not present

## 2018-02-24 MED FILL — LORazepam 1 MG TABS: 1 | 13 days supply | Qty: 13 | Fill #0

## 2018-02-25 ENCOUNTER — Ambulatory Visit
Admission: RE | Admit: 2018-02-25 | Discharge: 2018-02-25 | Disposition: A | Discharge status: Home / Self Care | Payer: Medicaid Other | Source: Ambulatory Visit | Attending: Radiation Oncology | Admitting: Radiation Oncology

## 2018-02-25 DIAGNOSIS — Z510 Encounter for antineoplastic radiation therapy: Secondary | ICD-10-CM | POA: Diagnosis not present

## 2018-02-26 ENCOUNTER — Ambulatory Visit
Admission: RE | Admit: 2018-02-26 | Discharge: 2018-02-26 | Disposition: A | Discharge status: Home / Self Care | Payer: Medicaid Other | Source: Ambulatory Visit | Attending: Radiation Oncology | Admitting: Radiation Oncology

## 2018-02-26 DIAGNOSIS — Z510 Encounter for antineoplastic radiation therapy: Secondary | ICD-10-CM | POA: Diagnosis present

## 2018-02-26 DIAGNOSIS — C61 Malignant neoplasm of prostate: Secondary | ICD-10-CM | POA: Insufficient documentation

## 2018-03-01 ENCOUNTER — Ambulatory Visit
Admission: RE | Admit: 2018-03-01 | Discharge: 2018-03-01 | Disposition: A | Discharge status: Home / Self Care | Payer: Medicaid Other | Source: Ambulatory Visit | Attending: Radiation Oncology | Admitting: Radiation Oncology

## 2018-03-01 DIAGNOSIS — Z510 Encounter for antineoplastic radiation therapy: Secondary | ICD-10-CM | POA: Diagnosis not present

## 2018-03-02 ENCOUNTER — Ambulatory Visit
Admission: RE | Admit: 2018-03-02 | Discharge: 2018-03-02 | Disposition: A | Discharge status: Home / Self Care | Payer: Medicaid Other | Source: Ambulatory Visit | Attending: Radiation Oncology | Admitting: Radiation Oncology

## 2018-03-02 DIAGNOSIS — Z510 Encounter for antineoplastic radiation therapy: Secondary | ICD-10-CM | POA: Diagnosis not present

## 2018-03-03 ENCOUNTER — Ambulatory Visit
Admission: RE | Admit: 2018-03-03 | Discharge: 2018-03-03 | Disposition: A | Discharge status: Home / Self Care | Payer: Medicaid Other | Source: Ambulatory Visit | Attending: Radiation Oncology | Admitting: Radiation Oncology

## 2018-03-03 DIAGNOSIS — Z510 Encounter for antineoplastic radiation therapy: Secondary | ICD-10-CM | POA: Diagnosis not present

## 2018-03-04 ENCOUNTER — Ambulatory Visit
Admission: RE | Admit: 2018-03-04 | Discharge: 2018-03-04 | Disposition: A | Discharge status: Home / Self Care | Payer: Medicaid Other | Source: Ambulatory Visit | Attending: Radiation Oncology | Admitting: Radiation Oncology

## 2018-03-04 DIAGNOSIS — Z510 Encounter for antineoplastic radiation therapy: Secondary | ICD-10-CM | POA: Diagnosis not present

## 2018-03-05 ENCOUNTER — Ambulatory Visit
Admission: RE | Admit: 2018-03-05 | Discharge: 2018-03-05 | Disposition: A | Discharge status: Home / Self Care | Payer: Medicaid Other | Source: Ambulatory Visit | Attending: Radiation Oncology | Admitting: Radiation Oncology

## 2018-03-05 DIAGNOSIS — Z510 Encounter for antineoplastic radiation therapy: Secondary | ICD-10-CM | POA: Diagnosis not present

## 2018-03-08 ENCOUNTER — Ambulatory Visit: Payer: Medicaid Other

## 2018-03-08 ENCOUNTER — Ambulatory Visit
Admission: RE | Admit: 2018-03-08 | Discharge: 2018-03-08 | Disposition: A | Discharge status: Home / Self Care | Payer: Medicaid Other | Source: Ambulatory Visit | Attending: Radiation Oncology | Admitting: Radiation Oncology

## 2018-03-08 DIAGNOSIS — Z510 Encounter for antineoplastic radiation therapy: Secondary | ICD-10-CM | POA: Diagnosis not present

## 2018-03-09 ENCOUNTER — Ambulatory Visit: Payer: Medicaid Other

## 2018-03-09 ENCOUNTER — Ambulatory Visit
Admission: RE | Admit: 2018-03-09 | Discharge: 2018-03-09 | Disposition: A | Discharge status: Home / Self Care | Payer: Medicaid Other | Source: Ambulatory Visit | Attending: Radiation Oncology | Admitting: Radiation Oncology

## 2018-03-09 DIAGNOSIS — Z510 Encounter for antineoplastic radiation therapy: Secondary | ICD-10-CM | POA: Diagnosis not present

## 2018-03-10 ENCOUNTER — Ambulatory Visit
Admission: RE | Admit: 2018-03-10 | Discharge: 2018-03-10 | Disposition: A | Discharge status: Home / Self Care | Payer: Medicaid Other | Source: Ambulatory Visit | Attending: Radiation Oncology | Admitting: Radiation Oncology

## 2018-03-10 ENCOUNTER — Ambulatory Visit: Payer: Medicaid Other

## 2018-03-10 DIAGNOSIS — Z510 Encounter for antineoplastic radiation therapy: Secondary | ICD-10-CM | POA: Diagnosis not present

## 2018-03-11 ENCOUNTER — Ambulatory Visit: Payer: Medicaid Other

## 2018-03-11 ENCOUNTER — Ambulatory Visit
Admission: RE | Admit: 2018-03-11 | Discharge: 2018-03-11 | Disposition: A | Discharge status: Home / Self Care | Payer: Medicaid Other | Source: Ambulatory Visit | Attending: Radiation Oncology | Admitting: Radiation Oncology

## 2018-03-11 DIAGNOSIS — Z510 Encounter for antineoplastic radiation therapy: Secondary | ICD-10-CM | POA: Diagnosis not present

## 2018-03-12 ENCOUNTER — Encounter: Payer: Self-pay | Admitting: Medical Oncology

## 2018-03-12 ENCOUNTER — Encounter: Payer: Self-pay | Admitting: Radiation Oncology

## 2018-03-12 ENCOUNTER — Ambulatory Visit
Admission: RE | Admit: 2018-03-12 | Discharge: 2018-03-12 | Disposition: A | Discharge status: Home / Self Care | Payer: Medicaid Other | Source: Ambulatory Visit | Attending: Radiation Oncology | Admitting: Radiation Oncology

## 2018-03-12 DIAGNOSIS — Z510 Encounter for antineoplastic radiation therapy: Secondary | ICD-10-CM | POA: Diagnosis not present

## 2018-03-28 NOTE — Progress Notes (Signed)
  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 Image guidance was performed with megavoltage CT studies prior to each fraction. He was immobilized with a body fix lower extremity mold.  Narrative: The patient tolerated radiation treatment relatively well.   He had minor bladder and rectal irritation.  Plan: The patient has completed radiation treatment. He will return to radiation oncology clinic for routine followup in one month. I advised him to call or return sooner if he has any questions or concerns related to his recovery or treatment. ________________________________  Sheral Apley. Tammi Klippel, M.D.

## 2018-04-27 ENCOUNTER — Encounter: Payer: Self-pay | Admitting: Emergency Medicine

## 2018-04-27 ENCOUNTER — Ambulatory Visit
Admission: RE | Admit: 2018-04-27 | Discharge: 2018-04-27 | Disposition: A | Discharge status: Home / Self Care | Payer: Medicaid Other | Source: Ambulatory Visit | Attending: Urology | Admitting: Urology

## 2018-04-27 ENCOUNTER — Other Ambulatory Visit: Payer: Self-pay

## 2018-04-27 ENCOUNTER — Encounter: Payer: Self-pay | Admitting: Urology

## 2018-04-27 VITALS — BP 138/75 | HR 69 | Temp 98.3°F | Resp 18 | Wt 203.5 lb

## 2018-04-27 DIAGNOSIS — Z79899 Other long term (current) drug therapy: Secondary | ICD-10-CM | POA: Insufficient documentation

## 2018-04-27 DIAGNOSIS — C61 Malignant neoplasm of prostate: Secondary | ICD-10-CM | POA: Insufficient documentation

## 2018-04-27 DIAGNOSIS — Z923 Personal history of irradiation: Secondary | ICD-10-CM | POA: Insufficient documentation

## 2018-04-27 NOTE — Progress Notes (Signed)
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  He tolerated radiation treatment relatively well with only minor bladder and rectal irritation.                             On review of systems, the patient states that he is doing very well overall.  He reports gradual improvement in his LUTS with a current IPSS score of 13.  He feels that he empties his bladder well on voiding and denies any straining but continues with increased urgency, nocturia and occasional mixed incontinence.  He specifically denies dysuria, gross hematuria, flank pain or suprapubic discomfort.  He has not had recent fever, chills or night sweats.  He denies abdominal pain, nausea, vomiting, diarrhea or constipation.  He reports a healthy appetite and is maintaining his weight.  He has a scheduled follow-up visit with Dr. Alinda Money on 06/25/2018.  ALLERGIES:  is allergic to amitriptyline; acetaminophen; hydrocodone; motrin [ibuprofen]; hydrocodone; motrin [ibuprofen]; and tylenol [acetaminophen].  Meds: Current Outpatient Medications  Medication Sig Dispense Refill  . levothyroxine (SYNTHROID, LEVOTHROID) 112 MCG tablet Take 112 mcg by mouth daily before breakfast.    . LORazepam (ATIVAN) 1 MG tablet Take 1 tablet (1 mg total) by mouth daily. 30 minutes prior to radiation treatments. 13 tablet 0  . Oxycodone HCl 10 MG TABS Take 10 mg by mouth every 4 (four) hours. Do not exceed more than 5 tablets daily    . baclofen (LIORESAL) 10 MG tablet Take 1  tablet (10 mg total) by mouth every 8 (eight) hours as needed for muscle spasms. (Patient not taking: Reported on 12/22/2016) 90 each 1  . cyclobenzaprine (FLEXERIL) 10 MG tablet Take 1 tablet (10 mg total) by mouth 3 (three) times daily as needed for muscle spasms. (Patient not taking: Reported on 12/30/2017) 30 tablet 0  . doxycycline (VIBRAMYCIN) 100 MG capsule Take 1 capsule (100 mg total) 2 (two) times daily by mouth. (Patient not taking: Reported on 04/27/2018) 28 capsule 0  . folic acid (FOLVITE) 1 MG tablet Take 1 tablet (1 mg total) by mouth daily. (Patient not taking: Reported on 12/22/2016) 30 tablet 0  . furosemide (LASIX) 40 MG tablet Take 0.5 tablets (20 mg total) by mouth daily. (Patient not taking: Reported on 12/22/2016) 15 tablet 0  . linaclotide (LINZESS) 290 MCG CAPS capsule Take 1 capsule (290 mcg total) by mouth daily before breakfast. (Patient not taking: Reported on 12/22/2016) 30 capsule 0  . LORazepam (ATIVAN) 2 MG tablet Take 2 mg by mouth 3 (three) times daily.    . Multiple Vitamin (MULTIVITAMIN WITH MINERALS) TABS tablet Take 1 tablet by mouth daily. (Patient not taking: Reported on 12/22/2016) 30 tablet 0  . nicotine (NICODERM CQ - DOSED IN MG/24 HOURS) 14 mg/24hr patch Place 1 patch (14 mg total) onto the skin daily. (Patient not taking: Reported on 11/21/2016) 28 patch 0  . ondansetron (ZOFRAN) 4 MG tablet Take 1 tablet (4 mg total)  by mouth every 8 (eight) hours as needed for nausea or vomiting. (Patient not taking: Reported on 04/27/2018) 30 tablet 1  . oxyCODONE-acetaminophen (PERCOCET) 10-325 MG tablet Take 1 tablet by mouth every 6 (six) hours as needed for pain. (Patient not taking: Reported on 12/30/2017) 20 tablet 0  . potassium chloride (K-DUR) 10 MEQ tablet Take 1 tablet (10 mEq total) by mouth daily. (Patient not taking: Reported on 12/22/2016) 30 tablet 0  . QUEtiapine (SEROQUEL) 50 MG tablet Take 50 mg by mouth at bedtime.    . rifaximin (XIFAXAN) 550 MG TABS tablet  Take 1 tablet (550 mg total) by mouth 2 (two) times daily. (Patient not taking: Reported on 02/11/2017) 60 tablet 10  . spironolactone (ALDACTONE) 50 MG tablet Take 1 tablet (50 mg total) by mouth 2 (two) times daily. (Patient not taking: Reported on 04/27/2018) 60 tablet 0  . thiamine 100 MG tablet Take 1 tablet (100 mg total) by mouth daily. (Patient not taking: Reported on 04/27/2018) 30 tablet 0  . zolpidem (AMBIEN) 5 MG tablet Take 1 tablet (5 mg total) by mouth at bedtime as needed for sleep. (Patient not taking: Reported on 04/27/2018) 15 tablet 1   No current facility-administered medications for this encounter.     Physical Findings:  weight is 203 lb 8 oz (92.3 kg). His oral temperature is 98.3 F (36.8 C). His blood pressure is 138/75 and his pulse is 69. His respiration is 18 and oxygen saturation is 100%.  Pain Assessment Pain Score: 7  Pain Loc: Hip/10 In general this is a well appearing Caucasian male in no acute distress.  He's alert and oriented x4 and appropriate throughout the examination. Cardiopulmonary assessment is negative for acute distress and he exhibits normal effort.   Lab Findings: Lab Results  Component Value Date   WBC 10.3 11/20/2017   HGB 11.9 (L) 11/20/2017   HCT 36.1 (L) 11/20/2017   MCV 94.3 11/20/2017   PLT 183 11/20/2017     Radiographic Findings: No results found.  Impression/Plan: 67. 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   He will continue to follow up with urology for ongoing PSA determinations and has an appointment scheduled with Dr. Alinda Money on 06/25/2018. He understands what to expect with regards to PSA monitoring going forward. I will look forward to following his response to treatment via correspondence with urology, and would be happy to continue to participate in his care if clinically indicated. I talked to the patient about what to expect in the future, including his risk for  erectile dysfunction and rectal bleeding. I encouraged him to call or return to the office if he has any questions regarding his previous radiation or possible radiation side effects. He was comfortable with this plan and will follow up as needed.    Nicholos Johns, PA-C

## 2018-05-26 ENCOUNTER — Other Ambulatory Visit: Payer: Self-pay | Admitting: Gastroenterology

## 2018-05-26 DIAGNOSIS — K7030 Alcoholic cirrhosis of liver without ascites: Secondary | ICD-10-CM

## 2018-05-27 NOTE — Addendum Note (Signed)
Encounter addended by: Wilmon Arms, RN on: 05/27/2018 11:33 AM  Actions taken: Visit Navigator Flowsheet section accepted

## 2018-05-31 ENCOUNTER — Inpatient Hospital Stay: Admission: RE | Admit: 2018-05-31 | Payer: Self-pay | Source: Ambulatory Visit

## 2018-05-31 ENCOUNTER — Other Ambulatory Visit: Payer: Self-pay

## 2018-06-01 ENCOUNTER — Other Ambulatory Visit: Payer: Self-pay

## 2018-06-07 ENCOUNTER — Other Ambulatory Visit: Payer: Self-pay

## 2018-06-09 ENCOUNTER — Other Ambulatory Visit: Payer: Self-pay

## 2018-06-15 ENCOUNTER — Other Ambulatory Visit: Payer: Self-pay

## 2018-06-17 ENCOUNTER — Other Ambulatory Visit: Payer: Self-pay

## 2018-08-11 ENCOUNTER — Telehealth: Payer: Self-pay | Admitting: Radiation Oncology

## 2018-08-11 NOTE — Telephone Encounter (Signed)
Patient left a voicemail requesting Dr. Tammi Klippel "send in a prescription for phenergan to [facility]." Promptly phoned patient back. Explained he was released from Dr. Johny Shears care back to Dr. Lynne Logan in December following completion of his prostate radiation. Explained that nausea experience four months s/p radiation is unrelated. Encouraged patient to reach out to PCP for evaluation to determine cause of nausea. Patient verbalized understanding of all reviewed and expressed appreciation for the prompt return call.

## 2018-09-15 ENCOUNTER — Encounter (HOSPITAL_COMMUNITY): Payer: Self-pay | Admitting: *Deleted

## 2018-09-15 ENCOUNTER — Emergency Department (HOSPITAL_COMMUNITY)
Admission: EM | Admit: 2018-09-15 | Discharge: 2018-09-15 | Disposition: A | Discharge status: Home / Self Care | Payer: Medicaid Other | Attending: Emergency Medicine | Admitting: Emergency Medicine

## 2018-09-15 ENCOUNTER — Other Ambulatory Visit: Payer: Self-pay

## 2018-09-15 DIAGNOSIS — Z79899 Other long term (current) drug therapy: Secondary | ICD-10-CM | POA: Diagnosis not present

## 2018-09-15 DIAGNOSIS — I10 Essential (primary) hypertension: Secondary | ICD-10-CM | POA: Diagnosis not present

## 2018-09-15 DIAGNOSIS — Z8546 Personal history of malignant neoplasm of prostate: Secondary | ICD-10-CM | POA: Insufficient documentation

## 2018-09-15 DIAGNOSIS — E039 Hypothyroidism, unspecified: Secondary | ICD-10-CM | POA: Insufficient documentation

## 2018-09-15 DIAGNOSIS — R609 Edema, unspecified: Secondary | ICD-10-CM | POA: Insufficient documentation

## 2018-09-15 DIAGNOSIS — F17210 Nicotine dependence, cigarettes, uncomplicated: Secondary | ICD-10-CM | POA: Insufficient documentation

## 2018-09-15 LAB — BASIC METABOLIC PANEL
Anion gap: 8 (ref 5–15)
BUN: 9 mg/dL (ref 6–20)
CO2: 25 mmol/L (ref 22–32)
Calcium: 9 mg/dL (ref 8.9–10.3)
Chloride: 100 mmol/L (ref 98–111)
Creatinine, Ser: 0.8 mg/dL (ref 0.61–1.24)
GFR calc Af Amer: >60 mL/min (ref >60)
GFR calc non Af Amer: >60 mL/min (ref >60)
Glucose, Bld: 100 mg/dL — ABNORMAL HIGH (ref 70–99)
Potassium: 4.2 mmol/L (ref 3.5–5.1)
Sodium: 133 mmol/L — ABNORMAL LOW (ref 135–145)

## 2018-09-15 LAB — CBC
HCT: 35.1 % — ABNORMAL LOW (ref 39.0–52.0)
Hemoglobin: 11.6 g/dL — ABNORMAL LOW (ref 13.0–17.0)
MCH: 32.7 pg (ref 26.0–34.0)
MCHC: 33 g/dL (ref 30.0–36.0)
MCV: 98.9 fL (ref 80.0–100.0)
Platelets: 164 10*3/uL (ref 150–400)
RBC: 3.55 MIL/uL — ABNORMAL LOW (ref 4.22–5.81)
RDW: 13.2 % (ref 11.5–15.5)
WBC: 5.5 10*3/uL (ref 4.0–10.5)
nRBC: 0 % (ref 0.0–0.2)

## 2018-09-15 MED ORDER — HYDROCHLOROTHIAZIDE 12.5 MG PO TABS: 12.5000 mg | ORAL_TABLET | Freq: Every day | ORAL | 0 refills | Status: DC

## 2018-09-15 MED ORDER — HYDROCHLOROTHIAZIDE 25 MG PO TABS
12.5000 mg | ORAL_TABLET | Freq: Once | ORAL | Status: AC
  Administered 2018-09-15: 12.5 mg via ORAL
  Filled 2018-09-15: qty 1
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
## 2018-09-15 NOTE — ED Notes (Signed)
Pt states that he thinks the swelling is due to his "staying on my feet all the time" and he states that he is here to make sure he "doesn't have an infection in my blood"

## 2018-09-15 NOTE — Discharge Instructions (Signed)
If you develop shortness of breath, pain in your legs, chest pain, fever, or any other new/concerning symptoms then return to the ER for evaluation.  Otherwise be sure to elevate your legs on multiple pillows whenever you are not on your legs and wear your compression stockings.  Follow-up with your family doctor for recheck of your blood pressure.

## 2018-09-15 NOTE — ED Triage Notes (Signed)
Pt is here for evaluation of bilateral leg swelling right greater than left.  Pt states that his legs have been swollen for 2-3 days.  Pt is supposed to be on an antibiotic for a staph infection in his back but had not been taking it. Pt states that due to the swelling in his legs he began taking this antibiotic again.  Pt states that the swelling is worse when he is on his feet and goes town when he elevates his legs.  Pt denies any sob with this.  No fever or chills.

## 2018-10-18 ENCOUNTER — Other Ambulatory Visit: Payer: Medicaid Other

## 2018-11-18 ENCOUNTER — Other Ambulatory Visit: Payer: Medicaid Other

## 2018-11-26 ENCOUNTER — Other Ambulatory Visit: Payer: Medicaid Other

## 2020-04-13 ENCOUNTER — Other Ambulatory Visit: Payer: Self-pay | Admitting: Neurosurgery

## 2020-04-13 DIAGNOSIS — S22081A Stable burst fracture of T11-T12 vertebra, initial encounter for closed fracture: Secondary | ICD-10-CM

## 2020-04-16 ENCOUNTER — Other Ambulatory Visit (HOSPITAL_COMMUNITY): Payer: Self-pay | Admitting: Urology

## 2020-04-16 DIAGNOSIS — C61 Malignant neoplasm of prostate: Secondary | ICD-10-CM

## 2020-05-01 ENCOUNTER — Ambulatory Visit (HOSPITAL_COMMUNITY)
Admission: RE | Admit: 2020-05-01 | Discharge: 2020-05-01 | Disposition: A | Discharge status: Home / Self Care | Payer: Medicaid Other | Source: Ambulatory Visit | Attending: Urology | Admitting: Urology

## 2020-05-01 ENCOUNTER — Other Ambulatory Visit: Payer: Self-pay

## 2020-05-01 DIAGNOSIS — C61 Malignant neoplasm of prostate: Secondary | ICD-10-CM | POA: Diagnosis present

## 2020-05-01 MED ORDER — PIFLIFOLASTAT F 18 (PYLARIFY) INJECTION
9.0000 | Freq: Once | INTRAVENOUS | Status: AC
  Administered 2020-05-01: 8.6 via INTRAVENOUS

## 2020-05-07 ENCOUNTER — Ambulatory Visit
Admission: RE | Admit: 2020-05-07 | Discharge: 2020-05-07 | Disposition: A | Discharge status: Home / Self Care | Payer: Medicaid Other | Source: Ambulatory Visit | Attending: Neurosurgery | Admitting: Neurosurgery

## 2020-05-07 DIAGNOSIS — S22081A Stable burst fracture of T11-T12 vertebra, initial encounter for closed fracture: Secondary | ICD-10-CM

## 2021-02-02 ENCOUNTER — Emergency Department (HOSPITAL_COMMUNITY)
Admission: EM | Admit: 2021-02-02 | Discharge: 2021-02-02 | Discharge status: Against Medical Advice | Payer: Medicaid Other

## 2021-02-02 ENCOUNTER — Other Ambulatory Visit: Payer: Self-pay

## 2021-02-02 NOTE — ED Notes (Signed)
Pt did not answer for triage. No answer X3

## 2021-05-20 ENCOUNTER — Ambulatory Visit: Payer: Medicaid Other | Admitting: Podiatry

## 2021-05-29 ENCOUNTER — Ambulatory Visit: Payer: Medicaid Other | Admitting: Podiatry

## 2021-06-10 ENCOUNTER — Ambulatory Visit: Payer: Medicaid Other | Admitting: Podiatry

## 2021-10-22 ENCOUNTER — Encounter (HOSPITAL_COMMUNITY): Payer: Self-pay

## 2021-10-22 ENCOUNTER — Ambulatory Visit (HOSPITAL_COMMUNITY)
Admission: EM | Admit: 2021-10-22 | Discharge: 2021-10-22 | Disposition: A | Discharge status: Home / Self Care | Payer: Medicaid Other | Attending: Emergency Medicine | Admitting: Emergency Medicine

## 2021-10-22 DIAGNOSIS — T300 Burn of unspecified body region, unspecified degree: Secondary | ICD-10-CM | POA: Diagnosis not present

## 2021-10-22 MED ORDER — SILVER SULFADIAZINE 1 % EX CREA: 1.0000 | TOPICAL_CREAM | Freq: Two times a day (BID) | CUTANEOUS | 0 refills | Status: DC

## 2021-11-20 ENCOUNTER — Ambulatory Visit: Payer: Medicaid Other | Admitting: Podiatry

## 2021-11-21 ENCOUNTER — Ambulatory Visit: Payer: Medicaid Other | Admitting: Internal Medicine

## 2021-12-06 ENCOUNTER — Encounter: Payer: Medicaid Other | Attending: Physician Assistant | Admitting: Physician Assistant

## 2021-12-06 DIAGNOSIS — C61 Malignant neoplasm of prostate: Secondary | ICD-10-CM | POA: Insufficient documentation

## 2021-12-06 DIAGNOSIS — X19XXXA Contact with other heat and hot substances, initial encounter: Secondary | ICD-10-CM | POA: Diagnosis not present

## 2021-12-06 DIAGNOSIS — T24312A Burn of third degree of left thigh, initial encounter: Secondary | ICD-10-CM | POA: Diagnosis not present

## 2021-12-06 DIAGNOSIS — I10 Essential (primary) hypertension: Secondary | ICD-10-CM | POA: Insufficient documentation
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
## 2021-12-06 NOTE — Progress Notes (Signed)
MYLIN, HIRANO ([id number]) Visit Report for 12/06/2021 Allergy List Details Patient Name: [name], [name]. Date of Service: 12/06/2021 1:00 PM Medical Record Number: [id number] Patient Account Number: [id number] Date of Birth/Sex: 1960/06/16 (61 y.o. M) Treating RN: Levora Dredge Primary Care Izak Anding: PATIENT, NO Other Clinician: Referring Jhoselyn Ruffini: Earnie Larsson Treating Johnica Armwood/Extender: Skipper Cliche in Treatment: 0 Allergies Active Allergies amitriptyline acetaminophen hydrocodone Motrin Motrin Allergy Notes Electronic Signature(s) Signed: 12/06/2021 1:33:23 PM By: Levora Dredge Entered By: Levora Dredge on 12/06/2021 13:33:22 Szydlowski, [name] ([id number]) -------------------------------------------------------------------------------- Arrival Information Details Patient Name: [name] Date of Service: 12/06/2021 1:00 PM Medical Record Number: [id number] Patient Account Number: [id number] Date of Birth/Sex: Feb 10, 1961 (61 y.o. M) Treating RN: Levora Dredge Primary Care Yassmine Tamm: PATIENT, NO Other Clinician: Referring Adelis Docter: Earnie Larsson Treating Jameelah Watts/Extender: Skipper Cliche in Treatment: 0 Visit Information Patient Arrived: Cane Arrival Time: 13:08 Accompanied By: wife Transfer Assistance: None Patient Identification Verified: Yes Secondary Verification Process Completed: Yes Electronic Signature(s) Signed: 12/06/2021 4:18:40 PM By: Levora Dredge Entered By: Levora Dredge on 12/06/2021 13:11:37 Venne, [name] ([id number]) -------------------------------------------------------------------------------- Clinic Level of Care Assessment Details Patient Name: [name]. Date of Service: 12/06/2021 1:00 PM Medical Record Number: [id number] Patient Account Number: [id number] Date of Birth/Sex: 01/09/1961 (61 y.o. M) Treating RN: Levora Dredge Primary Care Zohaib Heeney: PATIENT, NO Other Clinician: Referring Maliik Karner: Earnie Larsson Treating Mikaiah Stoffer/Extender: Skipper Cliche in Treatment: 0 Clinic Level of Care Assessment Items TOOL 1 Quantity Score []  - Use when EandM and Procedure is performed on INITIAL visit 0 ASSESSMENTS - Nursing Assessment / Reassessment X - General Physical Exam (combine w/ comprehensive assessment (listed just below) when performed on new 1 20 pt. evals) X- 1 25 Comprehensive Assessment (HX, ROS, Risk Assessments, Wounds Hx, etc.) ASSESSMENTS - Wound and Skin Assessment / Reassessment []  - Dermatologic / Skin Assessment (not related to wound area) 0 ASSESSMENTS - Ostomy and/or Continence Assessment and Care []  - Incontinence Assessment and Management 0 []  - 0 Ostomy Care Assessment and Management (repouching, etc.) PROCESS - Coordination of Care X - Simple Patient / Family Education for ongoing care 1 15 []  - 0 Complex (extensive) Patient / Family Education for ongoing care X- 1 10 Staff obtains Programmer, systems, Records, Test Results / Process Orders []  - 0 Staff telephones HHA, Nursing Homes / Clarify orders / etc []  - 0 Routine Transfer to another Facility (non-emergent condition) []  - 0 Routine Hospital Admission (non-emergent condition) X- 1 15 New Admissions / Biomedical engineer / Ordering NPWT, Apligraf, etc. []  - 0 Emergency Hospital Admission (emergent condition) PROCESS - Special Needs []  - Pediatric / Minor Patient Management 0 []  - 0 Isolation Patient Management []  - 0 Hearing / Language / Visual special needs []  - 0 Assessment of Community assistance (transportation, D/C planning, etc.) []  - 0 Additional assistance / Altered mentation []  - 0 Support Surface(s) Assessment (bed, cushion, seat, etc.) INTERVENTIONS - Miscellaneous []  - External ear exam 0 []  - 0 Patient Transfer (multiple staff / Civil Service fast streamer / Similar devices) []  - 0 Simple Staple / Suture removal (25 or less) []  - 0 Complex Staple / Suture removal (26 or more) []  -  0 Hypo/Hyperglycemic Management (do not check if billed separately) []  - 0 Ankle / Brachial Index (ABI) - do not check if billed separately Has the patient been seen at the hospital within the last three years: Yes Total Score: 85 Level Of Care: New/Established - Level 3 Hendler, Aakash R. ([id number]) Electronic Signature(s)  Signed: 12/06/2021 4:18:40 PM By: Levora Dredge Entered By: Levora Dredge on 12/06/2021 14:20:12 Avetisyan, [name] ([id number]) -------------------------------------------------------------------------------- Encounter Discharge Information Details Patient Name: [name] Date of Service: 12/06/2021 1:00 PM Medical Record Number: [id number] Patient Account Number: [id number] Date of Birth/Sex: Nov 29, 1960 (61 y.o. M) Treating RN: Levora Dredge Primary Care Imad Shostak: PATIENT, NO Other Clinician: Referring Henley Boettner: Earnie Larsson Treating Nevia Henkin/Extender: Skipper Cliche in Treatment: 0 Encounter Discharge Information Items Discharge Condition: Stable Ambulatory Status: Cane Discharge Destination: Home Transportation: Private Auto Accompanied By: girlfriend Schedule Follow-up Appointment: Yes Clinical Summary of Care: Electronic Signature(s) Signed: 12/06/2021 2:22:14 PM By: Levora Dredge Entered By: Levora Dredge on 12/06/2021 14:22:14 Marrow, [name] ([id number]) -------------------------------------------------------------------------------- Lower Extremity Assessment Details Patient Name: [name]. Date of Service: 12/06/2021 1:00 PM Medical Record Number: [id number] Patient Account Number: [id number] Date of Birth/Sex: 1961-03-08 (61 y.o. M) Treating RN: Levora Dredge Primary Care Bashar Milam: PATIENT, NO Other Clinician: Referring Kayelee Herbig: Earnie Larsson Treating Dearia Wilmouth/Extender: Skipper Cliche in Treatment: 0 Notes not completed due to location of wound on left hip Electronic Signature(s) Signed: 12/06/2021 4:18:40 PM  By: Levora Dredge Entered By: Levora Dredge on 12/06/2021 13:20:40 Hawkes, [name] ([id number]) -------------------------------------------------------------------------------- Multi Wound Chart Details Patient Name: [name]. Date of Service: 12/06/2021 1:00 PM Medical Record Number: [id number] Patient Account Number: [id number] Date of Birth/Sex: 11-22-1960 (61 y.o. M) Treating RN: Levora Dredge Primary Care Judie Hollick: PATIENT, NO Other Clinician: Referring Annabel Gibeau: Earnie Larsson Treating Sharica Roedel/Extender: Skipper Cliche in Treatment: 0 Vital Signs Height(in): 71 Pulse(bpm): 84 Weight(lbs): 200 Blood Pressure(mmHg): 148/84 Body Mass Index(BMI): 27.9 Temperature(F): 98.2 Respiratory Rate(breaths/min): 18 Photos: [N/A:N/A] Wound Location: Left Ilium N/A N/A Wounding Event: Thermal Burn N/A N/A Primary Etiology: 3rd degree Burn N/A N/A Comorbid History: Hypertension, Seizure Disorder, N/A N/A Received Radiation Date Acquired: 10/19/2021 N/A N/A Weeks of Treatment: 0 N/A N/A Wound Status: Open N/A N/A Wound Recurrence: No N/A N/A Measurements L x W x D (cm) 3.7x4.5x0.2 N/A N/A Area (cm) : 13.077 N/A N/A Volume (cm) : 2.615 N/A N/A % Reduction in Area: 0.00% N/A N/A % Reduction in Volume: 0.00% N/A N/A Classification: Full Thickness Without Exposed N/A N/A Support Structures Exudate Amount: Medium N/A N/A Exudate Type: Serosanguineous N/A N/A Exudate Color: red, brown N/A N/A Granulation Amount: Small (1-33%) N/A N/A Granulation Quality: Red, Pink N/A N/A Necrotic Amount: Large (67-100%) N/A N/A Exposed Structures: Fat Layer (Subcutaneous Tissue): N/A N/A Yes Epithelialization: Small (1-33%) N/A N/A Treatment Notes Electronic Signature(s) Signed: 12/06/2021 4:18:40 PM By: Levora Dredge Entered By: Levora Dredge on 12/06/2021 13:55:14 G[place]rop, [name]  ([id number]) -------------------------------------------------------------------------------- Multi-Disciplinary Care Plan Details Patient Name: [name]. Date of Service: 12/06/2021 1:00 PM Medical Record Number: [id number] Patient Account Number: [id number] Date of Birth/Sex: November 30, 1960 (61 y.o. M) Treating RN: Levora Dredge Primary Care Watt Geiler: PATIENT, NO Other Clinician: Referring Colleene Swarthout: Earnie Larsson Treating Terease Marcotte/Extender: Skipper Cliche in Treatment: 0 Active Inactive Orientation to the Wound Care Program Nursing Diagnoses: Knowledge deficit related to the wound healing center program Goals: Patient/caregiver will verbalize understanding of the [place] Program Date Initiated: 12/06/2021 Target Resolution Date: 12/13/2021 Goal Status: Active Interventions: Provide education on orientation to the wound center Notes: Wound/Skin Impairment Nursing Diagnoses: Impaired tissue integrity Knowledge deficit related to smoking impact on wound healing Knowledge deficit related to ulceration/compromised skin integrity Goals: Patient will demonstrate a reduced rate of smoking or cessation of smoking Date Initiated: 12/06/2021 Target Resolution Date: 01/03/2022 Goal Status: Active Patient/caregiver will verbalize understanding  of skin care regimen Date Initiated: 12/06/2021 Target Resolution Date: 12/13/2021 Goal Status: Active Ulcer/skin breakdown will have a volume reduction of 30% by week 4 Date Initiated: 12/06/2021 Target Resolution Date: 01/03/2022 Goal Status: Active Ulcer/skin breakdown will have a volume reduction of 50% by week 8 Date Initiated: 12/06/2021 Target Resolution Date: 01/31/2022 Goal Status: Active Ulcer/skin breakdown will have a volume reduction of 80% by week 12 Date Initiated: 12/06/2021 Target Resolution Date: 02/28/2022 Goal Status: Active Ulcer/skin breakdown will heal within 14 weeks Date Initiated: 12/06/2021 Target Resolution  Date: 03/14/2022 Goal Status: Active Interventions: Assess patient/caregiver ability to obtain necessary supplies Assess patient/caregiver ability to perform ulcer/skin care regimen upon admission and as needed Assess ulceration(s) every visit Provide education on smoking Provide education on ulcer and skin care Notes: Electronic Signature(s) SEM, MCCAUGHEY ([id number]) Signed: 12/06/2021 4:18:40 PM By: Levora Dredge Entered By: Levora Dredge on 12/06/2021 13:54:48 Malecki, [name] ([id number]) -------------------------------------------------------------------------------- Pain Assessment Details Patient Name: [name]. Date of Service: 12/06/2021 1:00 PM Medical Record Number: [id number] Patient Account Number: [id number] Date of Birth/Sex: 10-05-1960 (61 y.o. M) Treating RN: Levora Dredge Primary Care Wynn Kernes: PATIENT, NO Other Clinician: Referring Yamna Mackel: Earnie Larsson Treating York Valliant/Extender: Skipper Cliche in Treatment: 0 Active Problems Location of Pain Severity and Description of Pain Patient Has Paino Yes Site Locations Rate the pain. Current Pain Level: 7 Pain Management and Medication Current Pain Management: Notes pt states pain at burn site on left hip Electronic Signature(s) Signed: 12/06/2021 4:18:40 PM By: Levora Dredge Entered By: Levora Dredge on 12/06/2021 13:12:09 Estrada, [name] ([id number]) -------------------------------------------------------------------------------- Patient/Caregiver Education Details Patient Name: [name]. Date of Service: 12/06/2021 1:00 PM Medical Record Number: [id number] Patient Account Number: [id number] Date of Birth/Gender: [birth year] (60 y.o. M) Treating RN: Levora Dredge Primary Care Physician: PATIENT, NO Other Clinician: Referring Physician: Earnie Larsson Treating Physician/Extender: Skipper Cliche in Treatment: 0 Education Assessment Education Provided To: Patient Education  Topics Provided [facility]: Handouts: Welcome To [facility] Methods: Explain/Verbal Responses: State content correctly Wound Debridement: Handouts: Wound Debridement Methods: Explain/Verbal Responses: State content correctly Wound/Skin Impairment: Handouts: Caring for Your Ulcer Methods: Explain/Verbal Responses: State content correctly Electronic Signature(s) Signed: 12/06/2021 4:18:40 PM By: Levora Dredge Entered By: Levora Dredge on 12/06/2021 14:20:45 [place], [name] ([id number]) -------------------------------------------------------------------------------- Wound Assessment Details Patient Name: [name]. Date of Service: 12/06/2021 1:00 PM Medical Record Number: [id number] Patient Account Number: [id number] Date of Birth/Sex: November 11, 1960 (61 y.o. M) Treating RN: Levora Dredge Primary Care Stassi Fadely: PATIENT, NO Other Clinician: Referring Ervine Witucki: Earnie Larsson Treating Sha Burling/Extender: Skipper Cliche in Treatment: 0 Wound Status Wound Number: 1 Primary Etiology: 3rd degree Burn Wound Location: Left Ilium Wound Status: Open Wounding Event: Thermal Burn Comorbid History: Hypertension, Seizure Disorder, Received Radiation Date Acquired: 10/19/2021 Weeks Of Treatment: 0 Clustered Wound: No Photos Wound Measurements Length: (cm) 3.7 Width: (cm) 4.5 Depth: (cm) 0.2 Area: (cm) 13.077 Volume: (cm) 2.615 % Reduction in Area: 0% % Reduction in Volume: 0% Epithelialization: Small (1-33%) Tunneling: No Undermining: No Wound Description Classification: Full Thickness Without Exposed Support Structu Exudate Amount: Medium Exudate Type: Serosanguineous Exudate Color: red, brown res Foul Odor After Cleansing: No Slough/Fibrino Yes Wound Bed Granulation Amount: Small (1-33%) Exposed Structure Granulation Quality: Red, Pink Fat Layer (Subcutaneous Tissue) Exposed: Yes Necrotic Amount: Large (67-100%) Necrotic Quality:  Adherent Slough Treatment Notes Wound #1 (Ilium) Wound Laterality: Left Cleanser [place] Ancillary Kit - 15 Day Supply Discharge Instruction: Use supplies as instructed;  Kit contains: (15) Saline Bullets; (15) 3x3 Gauze; 15 pr Gloves Soap and Water Discharge Instruction: Gently cleanse wound with antibacterial soap, rinse and pat dry prior to dressing wounds Peri-Wound Care Topical Petroni, SEANPATRICK MAISANO ([id number]) Primary Dressing Xeroform 4x4-HBD (in/in) Discharge Instruction: Apply Xeroform 4x4-HBD (in/in) as directed Secondary Dressing Gauze Discharge Instruction: As directed: dry, folded (BORDER) Zetuvit Plus SILICONE BORDER Dressing 5x5 (in/in) Discharge Instruction: Please do not put silicone bordered dressings under wraps. Use non-bordered dressing only. Secured With Compression Wrap Compression Stockings Add-Ons Electronic Signature(s) Signed: 12/06/2021 1:51:18 PM By: Worthy Keeler PA-C Signed: 12/06/2021 4:18:40 PM By: Levora Dredge Entered By: Worthy Keeler on 12/06/2021 13:51:18 [place], [name] ([id number]) -------------------------------------------------------------------------------- Vitals Details Patient Name: [name] Date of Service: 12/06/2021 1:00 PM Medical Record Number: [id number] Patient Account Number: [id number] Date of Birth/Sex: [birth year] (60 y.o. M) Treating RN: Levora Dredge Primary Care Andreah Goheen: PATIENT, NO Other Clinician: Referring Askari Kinley: Earnie Larsson Treating Rider Ermis/Extender: Skipper Cliche in Treatment: 0 Vital Signs Time Taken: 13:12 Temperature (F): 98.2 Height (in): 71 Pulse (bpm): 66 Source: Stated Respiratory Rate (breaths/min): 18 Weight (lbs): 200 Blood Pressure (mmHg): 148/84 Source: Stated Reference Range: 80 - 120 mg / dl Body Mass Index (BMI): 27.9 Electronic Signature(s) Signed: 12/06/2021 4:18:40 PM By: Levora Dredge Entered By: Levora Dredge on 12/06/2021 13:12:51

## 2021-12-12 ENCOUNTER — Ambulatory Visit: Payer: Medicaid Other | Admitting: Physician Assistant

## 2022-03-13 ENCOUNTER — Ambulatory Visit (INDEPENDENT_AMBULATORY_CARE_PROVIDER_SITE_OTHER): Payer: Medicaid Other | Admitting: Podiatry

## 2022-03-13 DIAGNOSIS — Z91199 Patient's noncompliance with other medical treatment and regimen due to unspecified reason: Secondary | ICD-10-CM

## 2022-03-13 NOTE — Progress Notes (Signed)
Patient was no-show for appointment today 

## 2022-03-19 ENCOUNTER — Other Ambulatory Visit: Payer: Self-pay

## 2022-03-19 ENCOUNTER — Encounter (HOSPITAL_COMMUNITY): Payer: Self-pay | Admitting: Emergency Medicine

## 2022-03-19 ENCOUNTER — Emergency Department (HOSPITAL_COMMUNITY): Payer: Medicaid Other

## 2022-03-19 ENCOUNTER — Emergency Department (HOSPITAL_COMMUNITY)
Admission: EM | Admit: 2022-03-19 | Discharge: 2022-03-20 | Disposition: A | Discharge status: Home / Self Care | Payer: Medicaid Other | Attending: Emergency Medicine | Admitting: Emergency Medicine

## 2022-03-19 DIAGNOSIS — N3091 Cystitis, unspecified with hematuria: Secondary | ICD-10-CM | POA: Diagnosis not present

## 2022-03-19 DIAGNOSIS — Z8547 Personal history of malignant neoplasm of testis: Secondary | ICD-10-CM | POA: Diagnosis not present

## 2022-03-19 DIAGNOSIS — Z8546 Personal history of malignant neoplasm of prostate: Secondary | ICD-10-CM | POA: Insufficient documentation

## 2022-03-19 DIAGNOSIS — D649 Anemia, unspecified: Secondary | ICD-10-CM

## 2022-03-19 DIAGNOSIS — Z79890 Hormone replacement therapy (postmenopausal): Secondary | ICD-10-CM | POA: Diagnosis not present

## 2022-03-19 DIAGNOSIS — E039 Hypothyroidism, unspecified: Secondary | ICD-10-CM | POA: Diagnosis not present

## 2022-03-19 DIAGNOSIS — N3090 Cystitis, unspecified without hematuria: Secondary | ICD-10-CM

## 2022-03-19 DIAGNOSIS — N4889 Other specified disorders of penis: Secondary | ICD-10-CM

## 2022-03-19 DIAGNOSIS — R319 Hematuria, unspecified: Secondary | ICD-10-CM | POA: Diagnosis present

## 2022-03-19 DIAGNOSIS — R310 Gross hematuria: Secondary | ICD-10-CM | POA: Diagnosis not present

## 2022-03-19 DIAGNOSIS — E871 Hypo-osmolality and hyponatremia: Secondary | ICD-10-CM | POA: Diagnosis not present

## 2022-03-19 LAB — CBC WITH DIFFERENTIAL/PLATELET
Abs Immature Granulocytes: 0.02 10*3/uL (ref 0.00–0.07)
Basophils Absolute: 0.1 10*3/uL (ref 0.0–0.1)
Basophils Relative: 1 %
Eosinophils Absolute: 0.3 10*3/uL (ref 0.0–0.5)
Eosinophils Relative: 5 %
HCT: 34.5 % — ABNORMAL LOW (ref 39.0–52.0)
Hemoglobin: 11.4 g/dL — ABNORMAL LOW (ref 13.0–17.0)
Immature Granulocytes: 0 %
Lymphocytes Relative: 21 %
Lymphs Abs: 1.2 10*3/uL (ref 0.7–4.0)
MCH: 32.7 pg (ref 26.0–34.0)
MCHC: 33 g/dL (ref 30.0–36.0)
MCV: 98.9 fL (ref 80.0–100.0)
Monocytes Absolute: 0.4 10*3/uL (ref 0.1–1.0)
Monocytes Relative: 7 %
Neutro Abs: 3.7 10*3/uL (ref 1.7–7.7)
Neutrophils Relative %: 66 %
Platelets: 185 10*3/uL (ref 150–400)
RBC: 3.49 MIL/uL — ABNORMAL LOW (ref 4.22–5.81)
RDW: 13.8 % (ref 11.5–15.5)
WBC: 5.5 10*3/uL (ref 4.0–10.5)
nRBC: 0 % (ref 0.0–0.2)

## 2022-03-19 LAB — URINALYSIS, ROUTINE W REFLEX MICROSCOPIC
Bacteria, UA: NONE SEEN
RBC / HPF: >50 RBC/hpf — ABNORMAL HIGH (ref 0–5)
WBC, UA: >50 WBC/hpf — ABNORMAL HIGH (ref 0–5)

## 2022-03-19 LAB — BASIC METABOLIC PANEL
Anion gap: 7 (ref 5–15)
BUN: 14 mg/dL (ref 6–20)
CO2: 27 mmol/L (ref 22–32)
Calcium: 9.1 mg/dL (ref 8.9–10.3)
Chloride: 98 mmol/L (ref 98–111)
Creatinine, Ser: 0.95 mg/dL (ref 0.61–1.24)
GFR, Estimated: >60 mL/min (ref >60)
Glucose, Bld: 94 mg/dL (ref 70–99)
Potassium: 4.1 mmol/L (ref 3.5–5.1)
Sodium: 132 mmol/L — ABNORMAL LOW (ref 135–145)

## 2022-03-19 MED ORDER — HYDROMORPHONE HCL 1 MG/ML IJ SOLN
1.0000 mg | Freq: Once | INTRAMUSCULAR | Status: AC
  Administered 2022-03-19: 1 mg via INTRAVENOUS
  Filled 2022-03-19: qty 1

## 2022-03-19 MED ORDER — SODIUM CHLORIDE 0.9 % IV BOLUS
250.0000 mL | Freq: Once | INTRAVENOUS | Status: AC
  Administered 2022-03-19: 250 mL via INTRAVENOUS

## 2022-03-19 MED ORDER — OXYCODONE HCL 5 MG PO TABS
5.0000 mg | ORAL_TABLET | Freq: Once | ORAL | Status: AC
  Administered 2022-03-19: 5 mg via ORAL
  Filled 2022-03-19: qty 1

## 2022-03-19 MED ORDER — IOHEXOL 300 MG/ML  SOLN
100.0000 mL | Freq: Once | INTRAMUSCULAR | Status: AC | PRN
  Administered 2022-03-19: 100 mL via INTRAVENOUS

## 2022-03-19 MED ORDER — ONDANSETRON 8 MG PO TBDP
8.0000 mg | ORAL_TABLET | Freq: Once | ORAL | Status: AC
  Administered 2022-03-19: 8 mg via ORAL
  Filled 2022-03-19: qty 1

## 2022-03-19 NOTE — ED Provider Notes (Signed)
Care assumed from Dr. Alvino Chapel, patient with history of prostate cancer, presenting with gross hematuria. Unable to pass foley catheter. He is pending hematuria CT scan. He also has chronic pain, which is different than the pain he is having today.  CT scan shows thickened bladder wall suggesting cystitis.  No evidence of urinary retention or clots in the bladder.  I have independently viewed the images, and agree with radiologist's interpretation.  Patient has required multiple doses of hydromorphone for pain.  I have ordered a dose of ceftriaxone for his presumed urinary tract infection.  I discussed the case with Dr. Barbee Cough of urology service who has come to evaluate the patient and states that he should be treated for urinary tract infection as an outpatient and suggested using phenazopyridine to try to decrease his pain.  Avoid antispasmodics as they increase the risk of urinary retention.  I am discharging the patient with prescriptions for cefdinir and phenazopyridine.  He needs to follow-up with his urologist.  Strict return precautions have been given.  Results for orders placed or performed during the hospital encounter of 03/54/65  Basic metabolic panel  Result Value Ref Range   Sodium 132 (L) 135 - 145 mmol/L   Potassium 4.1 3.5 - 5.1 mmol/L   Chloride 98 98 - 111 mmol/L   CO2 27 22 - 32 mmol/L   Glucose, Bld 94 70 - 99 mg/dL   BUN 14 6 - 20 mg/dL   Creatinine, Ser 0.95 0.61 - 1.24 mg/dL   Calcium 9.1 8.9 - 10.3 mg/dL   GFR, Estimated >60 >60 mL/min   Anion gap 7 5 - 15  CBC with Differential  Result Value Ref Range   WBC 5.5 4.0 - 10.5 K/uL   RBC 3.49 (L) 4.22 - 5.81 MIL/uL   Hemoglobin 11.4 (L) 13.0 - 17.0 g/dL   HCT 34.5 (L) 39.0 - 52.0 %   MCV 98.9 80.0 - 100.0 fL   MCH 32.7 26.0 - 34.0 pg   MCHC 33.0 30.0 - 36.0 g/dL   RDW 13.8 11.5 - 15.5 %   Platelets 185 150 - 400 K/uL   nRBC 0.0 0.0 - 0.2 %   Neutrophils Relative % 66 %   Neutro Abs 3.7 1.7 - 7.7 K/uL    Lymphocytes Relative 21 %   Lymphs Abs 1.2 0.7 - 4.0 K/uL   Monocytes Relative 7 %   Monocytes Absolute 0.4 0.1 - 1.0 K/uL   Eosinophils Relative 5 %   Eosinophils Absolute 0.3 0.0 - 0.5 K/uL   Basophils Relative 1 %   Basophils Absolute 0.1 0.0 - 0.1 K/uL   Immature Granulocytes 0 %   Abs Immature Granulocytes 0.02 0.00 - 0.07 K/uL  Urinalysis, Routine w reflex microscopic Urine, Catheterized  Result Value Ref Range   Color, Urine RED (A) YELLOW   APPearance TURBID (A) CLEAR   Specific Gravity, Urine  1.005 - 1.030    TEST NOT REPORTED DUE TO COLOR INTERFERENCE OF URINE PIGMENT   pH  5.0 - 8.0    TEST NOT REPORTED DUE TO COLOR INTERFERENCE OF URINE PIGMENT   Glucose, UA (A) NEGATIVE mg/dL    TEST NOT REPORTED DUE TO COLOR INTERFERENCE OF URINE PIGMENT   Hgb urine dipstick (A) NEGATIVE    TEST NOT REPORTED DUE TO COLOR INTERFERENCE OF URINE PIGMENT   Bilirubin Urine (A) NEGATIVE    TEST NOT REPORTED DUE TO COLOR INTERFERENCE OF URINE PIGMENT   Ketones, ur (A) NEGATIVE mg/dL  TEST NOT REPORTED DUE TO COLOR INTERFERENCE OF URINE PIGMENT   Protein, ur (A) NEGATIVE mg/dL    TEST NOT REPORTED DUE TO COLOR INTERFERENCE OF URINE PIGMENT   Nitrite (A) NEGATIVE    TEST NOT REPORTED DUE TO COLOR INTERFERENCE OF URINE PIGMENT   Leukocytes,Ua (A) NEGATIVE    TEST NOT REPORTED DUE TO COLOR INTERFERENCE OF URINE PIGMENT   RBC / HPF >50 (H) 0 - 5 RBC/hpf   WBC, UA >50 (H) 0 - 5 WBC/hpf   Bacteria, UA NONE SEEN NONE SEEN   WBC Clumps PRESENT    CT HEMATURIA WORKUP  Result Date: 03/20/2022 CLINICAL DATA:  Hematuria. EXAM: CT ABDOMEN AND PELVIS WITHOUT AND WITH CONTRAST TECHNIQUE: Multidetector CT imaging of the abdomen and pelvis was performed following the standard protocol before and following the bolus administration of intravenous contrast. RADIATION DOSE REDUCTION: This exam was performed according to the departmental dose-optimization program which includes automated exposure control,  adjustment of the mA and/or kV according to patient size and/or use of iterative reconstruction technique. CONTRAST:  174m OMNIPAQUE IOHEXOL 300 MG/ML  SOLN COMPARISON:  PET-CT 05/01/2020. FINDINGS: Lower chest: There are minimal patchy ground-glass opacities lung bases. Hepatobiliary: There is nodular liver contour as seen on prior study. No focal liver lesions are identified. There are scattered calcifications throughout the liver. Gallbladder and bile ducts are within normal limits. Pancreas: Unremarkable. No pancreatic ductal dilatation or surrounding inflammatory changes. Spleen: There are calcified granulomas in the spleen. Spleen is otherwise within normal limits. Adrenals/Urinary Tract: Bilateral kidneys and adrenal glands are within normal limits. There is normal renal enhancement and excretion bilaterally. Renal collecting systems and ureters appear within normal limits. There is diffuse bladder wall thickening and enhancement with mild surrounding inflammatory stranding. Stomach/Bowel: Stomach is within normal limits. Appendix is not seen. No evidence of bowel wall thickening, distention, or inflammatory changes. There is a large amount of stool throughout the colon. Vascular/Lymphatic: Aorta and IVC are normal in size. There are severe atherosclerotic calcifications throughout the abdomen and pelvis. There are enlarged right inguinal lymph nodes measuring up to 14 mm short axis. These are similar to the prior study. No new enlarged lymph nodes are seen. Reproductive: Prostate gland is surgically absent. Other: There is a small amount of free fluid in the pelvis. No focal abdominal wall hernia. Musculoskeletal: T10/L1 posterior fusion hardware is present. There is chronic compression deformity of T12. Multilevel degenerative changes affect the spine. There is 4 mm of anterolisthesis at L4-L5. IMPRESSION: 1. Diffuse bladder wall thickening and enhancement with mild surrounding inflammatory stranding  worrisome for cystitis. 2. Stable right inguinal lymphadenopathy. 3. Small amount of free fluid in the pelvis. 4. Stable nodular liver contour compatible with cirrhosis. 5. Minimal patchy ground-glass opacities in the lung bases, likely infectious/inflammatory. Electronically Signed   By: ARonney AstersM.D.   On: [id number]/03/[id number]:12     GDelora Fuel MD 175/17/[phone]

## 2022-03-19 NOTE — ED Triage Notes (Signed)
Patient BIB EMS from home c/o hematuria x4 days. Pt report groin pain started today. Pt hx of stage 4 Prostate Ca.

## 2022-03-19 NOTE — ED Notes (Signed)
Urine cup given

## 2022-03-19 NOTE — ED Notes (Signed)
Foley catheter attempted, bloody urine return noted but could not inflate balloon, verbal order from doctor at bedside to pull foley out. Urine sample sent

## 2022-03-19 NOTE — ED Provider Triage Note (Signed)
Emergency Medicine Provider Triage Evaluation Note  JHAMIR PICKUP , a 61 y.o. male  was evaluated in triage.  Pt complains of penile burning and hematuria for 4 days. History of prostate cancer..  Review of Systems  Positive: Blood in urine, dysuria, chills Negative: Flank pain, abdominal pain, nausea, vomiting, fever  Physical Exam  BP (!) 172/84 (BP Location: Right Arm)   Pulse 64   Temp 98.1 F (36.7 C) (Oral)   Resp 18   Ht '5\' 10"'$  (1.778 m)   Wt 95 kg   SpO2 100%   BMI 30.05 kg/m  Gen:   Awake, appears uncomfortable Resp:  Normal effort  MSK:   Moves extremities without difficulty  Other:  No CVA tenderness. Abdomen soft  Medical Decision Making  Medically screening exam initiated at 5:36 PM.  Appropriate orders placed.  RUAIRI STUTSMAN was informed that the remainder of the evaluation will be completed by another provider, this initial triage assessment does not replace that evaluation, and the importance of remaining in the ED until their evaluation is complete.     Etta Quill, NP 03/19/22 2203
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
## 2022-03-20 MED ORDER — CEFDINIR 300 MG PO CAPS: 300.0000 mg | ORAL_CAPSULE | Freq: Two times a day (BID) | ORAL | 0 refills | Status: DC

## 2022-03-20 MED ORDER — PHENAZOPYRIDINE HCL 100 MG PO TABS: 100.0000 mg | ORAL_TABLET | Freq: Once | ORAL | Status: DC

## 2022-03-20 MED ORDER — HYDROMORPHONE HCL 1 MG/ML IJ SOLN
1.0000 mg | Freq: Once | INTRAMUSCULAR | Status: AC
  Administered 2022-03-20: 1 mg via INTRAVENOUS
  Filled 2022-03-20: qty 1

## 2022-03-20 MED ORDER — PHENAZOPYRIDINE HCL 200 MG PO TABS
200.0000 mg | ORAL_TABLET | Freq: Once | ORAL | Status: AC
  Administered 2022-03-20: 200 mg via ORAL
  Filled 2022-03-20: qty 1

## 2022-03-20 MED ORDER — HYDROMORPHONE HCL 1 MG/ML IJ SOLN
1.0000 mg | INTRAMUSCULAR | Status: DC | PRN
  Administered 2022-03-20 (×2): 1 mg via INTRAVENOUS
  Filled 2022-03-20 (×2): qty 1

## 2022-03-20 MED ORDER — SODIUM CHLORIDE 0.9 % IV SOLN
2.0000 g | Freq: Once | INTRAVENOUS | Status: AC
  Administered 2022-03-20: 2 g via INTRAVENOUS
  Filled 2022-03-20: qty 20

## 2022-03-20 MED ORDER — PHENAZOPYRIDINE HCL 200 MG PO TABS: 200.0000 mg | ORAL_TABLET | Freq: Three times a day (TID) | ORAL | 0 refills | Status: DC

## 2022-03-20 NOTE — Discharge Instructions (Signed)
Drink plenty of fluids.  Take your pain medication as needed.  Return to the emergency department if pain is not being adequately controlled, you start running a fever, you start vomiting, or if you are unable to urinate.
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
## 2022-03-20 NOTE — ED Notes (Signed)
Patient reports they are calling a ride to come get them.

## 2022-03-22 LAB — URINE CULTURE: Culture: 80000 — AB

## 2022-03-23 ENCOUNTER — Telehealth (HOSPITAL_BASED_OUTPATIENT_CLINIC_OR_DEPARTMENT_OTHER): Payer: Self-pay | Admitting: *Deleted

## 2022-03-23 NOTE — Telephone Encounter (Signed)
Post ED Visit - Positive Culture Follow-up  Culture report reviewed by antimicrobial stewardship pharmacist: [place] Team '[]'$  Elenor Quinones, Pharm.D. '[]'$  Heide Guile, Pharm.D., BCPS AQ-ID '[]'$  Parks Neptune, Pharm.D., BCPS '[]'$  Alycia Rossetti, Pharm.D., BCPS '[]'$  [place], Pharm.D., BCPS, AAHIVP '[]'$  Legrand Como, Pharm.D., BCPS, AAHIVP '[]'$  Salome Arnt, PharmD, BCPS '[]'$  Johnnette Gourd, PharmD, BCPS '[]'$  Hughes Better, PharmD, BCPS '[]'$  Leeroy Cha, PharmD '[]'$  Laqueta Linden, PharmD, BCPS '[]'$  Albertina Parr, PharmD  [place] Team '[]'$  Leodis Sias, PharmD '[]'$  Lindell Spar, PharmD '[]'$  Royetta Asal, PharmD '[]'$  Graylin Shiver, Rph '[]'$  Rema Fendt) Glennon Mac, PharmD '[]'$  Arlyn Dunning, PharmD '[]'$  Netta Cedars, PharmD '[]'$  Dia Sitter, PharmD '[]'$  Leone Haven, PharmD '[]'$  Gretta Arab, PharmD '[]'$  Theodis Shove, PharmD '[]'$  Peggyann Juba, PharmD '[x]'$  Eilene Ghazi, PharmD   Positive urine culture Treated with Cefdinir, organism sensitive to the same and no further patient follow-up is required at this time.  [name] 03/23/2022, 8:50 AM

## 2022-04-14 ENCOUNTER — Ambulatory Visit (INDEPENDENT_AMBULATORY_CARE_PROVIDER_SITE_OTHER): Payer: Medicaid Other | Admitting: Primary Care

## 2022-05-08 ENCOUNTER — Other Ambulatory Visit: Payer: Self-pay | Admitting: Urology

## 2022-05-08 DIAGNOSIS — C801 Malignant (primary) neoplasm, unspecified: Secondary | ICD-10-CM

## 2022-05-08 DIAGNOSIS — C7951 Secondary malignant neoplasm of bone: Secondary | ICD-10-CM

## 2022-05-20 ENCOUNTER — Other Ambulatory Visit: Payer: Self-pay | Admitting: Adult Health

## 2022-05-20 DIAGNOSIS — C778 Secondary and unspecified malignant neoplasm of lymph nodes of multiple regions: Secondary | ICD-10-CM

## 2022-06-05 ENCOUNTER — Ambulatory Visit: Payer: Medicaid Other | Admitting: Podiatry

## 2023-01-21 ENCOUNTER — Ambulatory Visit: Payer: Medicaid Other | Admitting: Student

## 2023-03-23 ENCOUNTER — Ambulatory Visit (INDEPENDENT_AMBULATORY_CARE_PROVIDER_SITE_OTHER): Payer: Medicaid Other

## 2023-03-23 ENCOUNTER — Encounter (HOSPITAL_COMMUNITY): Payer: Self-pay | Admitting: Emergency Medicine

## 2023-03-23 ENCOUNTER — Ambulatory Visit (HOSPITAL_COMMUNITY)
Admission: EM | Admit: 2023-03-23 | Discharge: 2023-03-23 | Disposition: A | Discharge status: Home / Self Care | Payer: Medicaid Other | Attending: Family Medicine | Admitting: Family Medicine

## 2023-03-23 DIAGNOSIS — S2242XA Multiple fractures of ribs, left side, initial encounter for closed fracture: Secondary | ICD-10-CM | POA: Diagnosis not present

## 2023-03-23 MED ORDER — KETOROLAC TROMETHAMINE 10 MG PO TABS: 10.0000 mg | ORAL_TABLET | Freq: Four times a day (QID) | ORAL | 0 refills | Status: DC | PRN
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
## 2023-03-23 NOTE — ED Triage Notes (Addendum)
Pt presents after falling last night and hit side on bath tub.  C/o of rib pain on left side  Pt is asking for refill on BP and thyroid medications.

## 2023-03-23 NOTE — Discharge Instructions (Addendum)
Your ribs 7 and 8 are broken.  1 is slightly displaced.  You have been given a shot of Toradol 30 mg today.  Ketorolac 10 mg tablets--take 1 tablet every 6 hours as needed for pain.  This is the same medicine that is in the shot we just gave you  Ice the area  Use the incentive spirometer to take a deep breath once hourly.  This is to keep your lungs expanded and prevent pneumonia.

## 2023-06-22 ENCOUNTER — Ambulatory Visit (INDEPENDENT_AMBULATORY_CARE_PROVIDER_SITE_OTHER): Payer: Medicaid Other | Admitting: Podiatry

## 2023-06-22 DIAGNOSIS — Z91199 Patient's noncompliance with other medical treatment and regimen due to unspecified reason: Secondary | ICD-10-CM

## 2023-06-22 NOTE — Progress Notes (Signed)
 No show

## 2023-06-30 ENCOUNTER — Ambulatory Visit (INDEPENDENT_AMBULATORY_CARE_PROVIDER_SITE_OTHER): Payer: Medicaid Other | Admitting: Podiatry

## 2023-06-30 DIAGNOSIS — Z91199 Patient's noncompliance with other medical treatment and regimen due to unspecified reason: Secondary | ICD-10-CM

## 2023-06-30 NOTE — Progress Notes (Signed)
 No show

## 2023-10-17 ENCOUNTER — Inpatient Hospital Stay (HOSPITAL_COMMUNITY)
Admission: EM | Admit: 2023-10-17 | Discharge: 2023-10-19 | DRG: 917 | Disposition: A | Discharge status: Home / Self Care | Attending: Family Medicine | Admitting: Family Medicine

## 2023-10-17 ENCOUNTER — Emergency Department (HOSPITAL_COMMUNITY)

## 2023-10-17 ENCOUNTER — Observation Stay (HOSPITAL_COMMUNITY)

## 2023-10-17 ENCOUNTER — Encounter (HOSPITAL_COMMUNITY): Payer: Self-pay

## 2023-10-17 ENCOUNTER — Other Ambulatory Visit: Payer: Self-pay

## 2023-10-17 DIAGNOSIS — D696 Thrombocytopenia, unspecified: Secondary | ICD-10-CM | POA: Diagnosis present

## 2023-10-17 DIAGNOSIS — Z79891 Long term (current) use of opiate analgesic: Secondary | ICD-10-CM

## 2023-10-17 DIAGNOSIS — R404 Transient alteration of awareness: Principal | ICD-10-CM

## 2023-10-17 DIAGNOSIS — G894 Chronic pain syndrome: Secondary | ICD-10-CM | POA: Diagnosis present

## 2023-10-17 DIAGNOSIS — K700 Alcoholic fatty liver: Secondary | ICD-10-CM | POA: Insufficient documentation

## 2023-10-17 DIAGNOSIS — E890 Postprocedural hypothyroidism: Secondary | ICD-10-CM | POA: Diagnosis present

## 2023-10-17 DIAGNOSIS — E871 Hypo-osmolality and hyponatremia: Secondary | ICD-10-CM

## 2023-10-17 DIAGNOSIS — Z79890 Hormone replacement therapy (postmenopausal): Secondary | ICD-10-CM

## 2023-10-17 DIAGNOSIS — T68XXXA Hypothermia, initial encounter: Secondary | ICD-10-CM

## 2023-10-17 DIAGNOSIS — N390 Urinary tract infection, site not specified: Secondary | ICD-10-CM | POA: Diagnosis present

## 2023-10-17 DIAGNOSIS — F419 Anxiety disorder, unspecified: Secondary | ICD-10-CM | POA: Diagnosis present

## 2023-10-17 DIAGNOSIS — Z820 Family history of epilepsy and other diseases of the nervous system: Secondary | ICD-10-CM

## 2023-10-17 DIAGNOSIS — Z720 Tobacco use: Secondary | ICD-10-CM | POA: Diagnosis present

## 2023-10-17 DIAGNOSIS — R001 Bradycardia, unspecified: Principal | ICD-10-CM | POA: Diagnosis present

## 2023-10-17 DIAGNOSIS — Z811 Family history of alcohol abuse and dependence: Secondary | ICD-10-CM

## 2023-10-17 DIAGNOSIS — Z806 Family history of leukemia: Secondary | ICD-10-CM

## 2023-10-17 DIAGNOSIS — Z8547 Personal history of malignant neoplasm of testis: Secondary | ICD-10-CM

## 2023-10-17 DIAGNOSIS — Z923 Personal history of irradiation: Secondary | ICD-10-CM

## 2023-10-17 DIAGNOSIS — B9620 Unspecified Escherichia coli [E. coli] as the cause of diseases classified elsewhere: Secondary | ICD-10-CM | POA: Diagnosis present

## 2023-10-17 DIAGNOSIS — E039 Hypothyroidism, unspecified: Secondary | ICD-10-CM | POA: Diagnosis present

## 2023-10-17 DIAGNOSIS — K7460 Unspecified cirrhosis of liver: Secondary | ICD-10-CM | POA: Diagnosis present

## 2023-10-17 DIAGNOSIS — C61 Malignant neoplasm of prostate: Secondary | ICD-10-CM | POA: Diagnosis present

## 2023-10-17 DIAGNOSIS — T40605A Adverse effect of unspecified narcotics, initial encounter: Secondary | ICD-10-CM | POA: Diagnosis present

## 2023-10-17 DIAGNOSIS — E876 Hypokalemia: Secondary | ICD-10-CM | POA: Diagnosis present

## 2023-10-17 DIAGNOSIS — K7030 Alcoholic cirrhosis of liver without ascites: Secondary | ICD-10-CM | POA: Diagnosis present

## 2023-10-17 DIAGNOSIS — Z833 Family history of diabetes mellitus: Secondary | ICD-10-CM

## 2023-10-17 DIAGNOSIS — T43621A Poisoning by amphetamines, accidental (unintentional), initial encounter: Principal | ICD-10-CM | POA: Diagnosis present

## 2023-10-17 DIAGNOSIS — G928 Other toxic encephalopathy: Principal | ICD-10-CM | POA: Diagnosis present

## 2023-10-17 DIAGNOSIS — Z9079 Acquired absence of other genital organ(s): Secondary | ICD-10-CM

## 2023-10-17 DIAGNOSIS — G9340 Encephalopathy, unspecified: Secondary | ICD-10-CM | POA: Diagnosis present

## 2023-10-17 DIAGNOSIS — F1910 Other psychoactive substance abuse, uncomplicated: Secondary | ICD-10-CM

## 2023-10-17 DIAGNOSIS — Z825 Family history of asthma and other chronic lower respiratory diseases: Secondary | ICD-10-CM

## 2023-10-17 DIAGNOSIS — F17210 Nicotine dependence, cigarettes, uncomplicated: Secondary | ICD-10-CM | POA: Diagnosis present

## 2023-10-17 DIAGNOSIS — Z8546 Personal history of malignant neoplasm of prostate: Secondary | ICD-10-CM

## 2023-10-17 DIAGNOSIS — E0500 Thyrotoxicosis with diffuse goiter without thyrotoxic crisis or storm: Secondary | ICD-10-CM | POA: Insufficient documentation

## 2023-10-17 DIAGNOSIS — D649 Anemia, unspecified: Secondary | ICD-10-CM | POA: Diagnosis present

## 2023-10-17 LAB — COMPREHENSIVE METABOLIC PANEL WITH GFR
ALT: 11 U/L (ref 0–44)
AST: 27 U/L (ref 15–41)
Albumin: 3.9 g/dL (ref 3.5–5.0)
Alkaline Phosphatase: 61 U/L (ref 38–126)
Anion gap: 14 (ref 5–15)
BUN: 18 mg/dL (ref 6–20)
CO2: 28 mmol/L (ref 22–32)
Calcium: 9 mg/dL (ref 8.9–10.3)
Chloride: 86 mmol/L — ABNORMAL LOW (ref 98–111)
Creatinine, Ser: 1.29 mg/dL — ABNORMAL HIGH (ref 0.61–1.24)
GFR, Estimated: >60 mL/min (ref >60)
Glucose, Bld: 153 mg/dL — ABNORMAL HIGH (ref 70–99)
Potassium: 2.6 mmol/L — CL (ref 3.5–5.1)
Sodium: 128 mmol/L — ABNORMAL LOW (ref 135–145)
Total Bilirubin: 0.7 mg/dL (ref 0.0–1.2)
Total Protein: 7.4 g/dL (ref 6.5–8.1)

## 2023-10-17 LAB — RAPID URINE DRUG SCREEN, HOSP PERFORMED
Amphetamines: POSITIVE — AB
Barbiturates: NOT DETECTED
Benzodiazepines: NOT DETECTED
Cocaine: POSITIVE — AB
Opiates: NOT DETECTED
Tetrahydrocannabinol: NOT DETECTED

## 2023-10-17 LAB — CBC WITH DIFFERENTIAL/PLATELET
Abs Immature Granulocytes: 0.02 10*3/uL (ref 0.00–0.07)
Basophils Absolute: 0 10*3/uL (ref 0.0–0.1)
Basophils Relative: 1 %
Eosinophils Absolute: 0.2 10*3/uL (ref 0.0–0.5)
Eosinophils Relative: 4 %
HCT: 33 % — ABNORMAL LOW (ref 39.0–52.0)
Hemoglobin: 11.3 g/dL — ABNORMAL LOW (ref 13.0–17.0)
Immature Granulocytes: 0 %
Lymphocytes Relative: 17 %
Lymphs Abs: 0.8 10*3/uL (ref 0.7–4.0)
MCH: 30.7 pg (ref 26.0–34.0)
MCHC: 34.2 g/dL (ref 30.0–36.0)
MCV: 89.7 fL (ref 80.0–100.0)
Monocytes Absolute: 0.3 10*3/uL (ref 0.1–1.0)
Monocytes Relative: 7 %
Neutro Abs: 3.3 10*3/uL (ref 1.7–7.7)
Neutrophils Relative %: 71 %
Platelets: 142 10*3/uL — ABNORMAL LOW (ref 150–400)
RBC: 3.68 MIL/uL — ABNORMAL LOW (ref 4.22–5.81)
RDW: 14.2 % (ref 11.5–15.5)
WBC: 4.6 10*3/uL (ref 4.0–10.5)
nRBC: 0 % (ref 0.0–0.2)

## 2023-10-17 LAB — URINALYSIS, ROUTINE W REFLEX MICROSCOPIC
Bilirubin Urine: NEGATIVE
Glucose, UA: NEGATIVE mg/dL
Ketones, ur: NEGATIVE mg/dL
Nitrite: NEGATIVE
Protein, ur: NEGATIVE mg/dL
Specific Gravity, Urine: 1.003 — ABNORMAL LOW (ref 1.005–1.030)
pH: 8 (ref 5.0–8.0)

## 2023-10-17 LAB — PROTIME-INR
INR: 1 (ref 0.8–1.2)
Prothrombin Time: 13.6 s (ref 11.4–15.2)

## 2023-10-17 LAB — BRAIN NATRIURETIC PEPTIDE: B Natriuretic Peptide: 77.7 pg/mL (ref 0.0–100.0)

## 2023-10-17 LAB — MAGNESIUM: Magnesium: 1.8 mg/dL (ref 1.7–2.4)

## 2023-10-17 LAB — ETHANOL: Alcohol, Ethyl (B): <15 mg/dL (ref <15)

## 2023-10-17 LAB — LIPASE, BLOOD: Lipase: 29 U/L (ref 11–51)

## 2023-10-17 LAB — CK: Total CK: 193 U/L (ref 49–397)

## 2023-10-17 LAB — TSH: TSH: 30.72 u[IU]/mL — ABNORMAL HIGH (ref 0.350–4.500)

## 2023-10-17 MED ORDER — LEVOTHYROXINE SODIUM 25 MCG PO TABS
25.0000 ug | ORAL_TABLET | Freq: Every day | ORAL | Status: DC
  Administered 2023-10-17: 25 ug via ORAL
  Filled 2023-10-17: qty 1

## 2023-10-17 MED ORDER — ONDANSETRON HCL 4 MG/2ML IJ SOLN: 4.0000 mg | Freq: Four times a day (QID) | INTRAMUSCULAR | Status: DC | PRN

## 2023-10-17 MED ORDER — ENOXAPARIN SODIUM 40 MG/0.4ML IJ SOSY
40.0000 mg | PREFILLED_SYRINGE | INTRAMUSCULAR | Status: DC
  Administered 2023-10-17 – 2023-10-18 (×2): 40 mg via SUBCUTANEOUS
  Filled 2023-10-17 (×2): qty 0.4

## 2023-10-17 MED ORDER — POTASSIUM CHLORIDE CRYS ER 20 MEQ PO TBCR
20.0000 meq | EXTENDED_RELEASE_TABLET | Freq: Once | ORAL | Status: AC
  Administered 2023-10-17: 20 meq via ORAL
  Filled 2023-10-17: qty 1

## 2023-10-17 MED ORDER — ONDANSETRON HCL 4 MG PO TABS: 4.0000 mg | ORAL_TABLET | Freq: Four times a day (QID) | ORAL | Status: DC | PRN

## 2023-10-17 MED ORDER — SODIUM CHLORIDE 0.9 % IV BOLUS
1000.0000 mL | Freq: Once | INTRAVENOUS | Status: AC
  Administered 2023-10-17: 1000 mL via INTRAVENOUS

## 2023-10-17 MED ORDER — POTASSIUM CHLORIDE 10 MEQ/100ML IV SOLN
10.0000 meq | INTRAVENOUS | Status: AC
  Administered 2023-10-17 (×4): 10 meq via INTRAVENOUS
  Filled 2023-10-17 (×4): qty 100

## 2023-10-17 NOTE — Assessment & Plan Note (Signed)
 LFTs and INR stable  Has been sober x  years

## 2023-10-17 NOTE — ED Notes (Addendum)
 Pt AAOx4 at this time. Pt has no medical complaints at this time. Negative for stroke symptoms.

## 2023-10-17 NOTE — Assessment & Plan Note (Addendum)
 63 year old found unresponsive on ground at home and EMS was called. Narcan  given and he woke up some. Now having persistent brachycardia assumed secondary to opiates. He is alert and oriented  -obs to tele -pressures stable, will continue to monitor overnight -replete potassium  -check TSH and free T4 -may also be contributing as he has been off medication for awhile -hold home oxycodone . Per pmp website gets 150 pills of oxycodone  per month  -later found to be hypothermic, placed on bearhugger

## 2023-10-17 NOTE — Assessment & Plan Note (Addendum)
 Graves disease s/p radiation  Check TSH and free T4 to make sure no contributing to bradycardia Symptoms of hypothyroidism including fatigue, cold intolerance and constipation  Start back synthroid  at 25mcg today, will need to be adjusted based of TSH

## 2023-10-17 NOTE — ED Notes (Signed)
 Back from CT

## 2023-10-17 NOTE — Assessment & Plan Note (Addendum)
 Declines Nicotine  patch Encouraged cessation

## 2023-10-17 NOTE — Assessment & Plan Note (Signed)
 States this is terminal Follows with [facility]

## 2023-10-17 NOTE — Assessment & Plan Note (Addendum)
 UDS + cocaine and amphetamines  Denies any use

## 2023-10-17 NOTE — ED Notes (Signed)
 Pt heart rate dropping to high 30's/low 40's,  EDP notified and at bedside. Per provider No action to be taken at this time.

## 2023-10-17 NOTE — Assessment & Plan Note (Addendum)
 Found to be hypothermic later in evening Likely combination of opioids, untreated thyroid , but TSH pending  Urine abnormal, but denies any symptoms, culture pending CT head pending Continue bear hugger until normotensive

## 2023-10-17 NOTE — Assessment & Plan Note (Signed)
 Only baseline we have is from a year ago around 132  128, denies any alcohol  use. CXR appears volume overloaded but clinically he appears dry on exam, bnp normal  Given 1L IVF in ED Hold fluids and see repeat sodium Follow up on TSH
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
## 2023-10-17 NOTE — Assessment & Plan Note (Signed)
 Monitor on tele  Magnesium  wnl Given in ED via IV Will give 20meq pill Trend

## 2023-10-17 NOTE — ED Notes (Signed)
 Pt HR 39; Pt bradycardiac since arrival. Provider notified. No new orders at this time.

## 2023-10-17 NOTE — ED Notes (Signed)
Bair hugger in use 

## 2023-10-17 NOTE — ED Notes (Signed)
 CRITICAL VALUE STICKER  CRITICAL VALUE:Potassium 2.6  RECEIVER (on-site recipient of call):I. Badr Piedra  DATE & TIME NOTIFIED: 1757 10/17/23  MESSENGER (representative from lab):lab  MD NOTIFIED: Towana  TIME OF NOTIFICATION:1758  RESPONSE:

## 2023-10-17 NOTE — ED Triage Notes (Signed)
 Pt BIBA from home for being unresponsive.  Per EMS they were called bc patient was not answering calls.  He was found unresponsive, other responders gave 12mg  Narcan  IN,  pt became responsive.    Initial pressure was 90/60 sitting and 65/42 standing with a bolus of 500ml NS he rebounded to 136/76  100%on room air  Pt has hx of prostate cancer but is no longer seeking tx.   Pt states taking 2 tylenol  PM @ 10 am this morning and has not felt well or had appetite for a week  Pt is visibly jaundiced.  Denies pain

## 2023-10-17 NOTE — Assessment & Plan Note (Signed)
 Appears to be on oxycodone  from neurosurgery #150/month per [facility] until no longer bradycardic/hypothermic Would notify prescriber

## 2023-10-17 NOTE — ED Notes (Addendum)
 Pt currently deep sleeping snoring and moving foot. Pt is currently breathing on his own. Pt groaning to pain stimulus, only responsive to pain. Provider aware

## 2023-10-17 NOTE — ED Provider Notes (Signed)
 [place] EMERGENCY DEPARTMENT AT [facility] Provider Note   CSN: [id number] Arrival date & time: 10/17/23  1534     Patient presents with: Fatigue   [name] is a 63 y.o. male.  He has history of prostate cancer and is declining any treatment, chronic neck and back pain, liver cirrhosis.  He said he has not felt like eating or drinking for the last week or so.  He thinks he passed out on the porch today.  Denies any injury.  Per EMS they were called for a wellness check and found him unresponsive, gave him 12 mg of Narcan  with improvement in mental status.  Initial blood pressure Cornelia improved with fluids.  Patient does endorse that he is on oxycodone  for pain but denies that he overdosed.  He said he thinks he fell asleep because of the heat today.  He denies any recent alcohol  or drug use.  Has no medical complaints.  {Add pertinent medical, surgical, social history, OB history to YEP:67052} The history is provided by the patient.  Altered Mental Status Presenting symptoms: unresponsiveness   Most recent episode:  Today Episode history:  Single Progression:  Resolved Chronicity:  New Associated symptoms: no abdominal pain, no fever, no headaches, no nausea, no rash and no vomiting        Prior to Admission medications   Medication Sig Start Date End Date Taking? Authorizing Provider  folic acid  (FOLVITE ) 1 MG tablet Take 1 tablet (1 mg total) by mouth daily. Patient not taking: Reported on 12/22/2016 06/05/16   Maurice Sharlet RAMAN, PA-C  furosemide  (LASIX ) 40 MG tablet Take 0.5 tablets (20 mg total) by mouth daily. Patient not taking: Reported on 12/22/2016 06/04/16   Love, Sharlet RAMAN, PA-C  hydrochlorothiazide  (HYDRODIURIL ) 12.5 MG tablet Take 1 tablet (12.5 mg total) by mouth daily. 09/15/18   Freddi Hamilton, MD  ketorolac  (TORADOL ) 10 MG tablet Take 1 tablet (10 mg total) by mouth every 6 (six) hours as needed (pain). 03/23/23   Vonna Sharlet POUR, MD  levothyroxine   (SYNTHROID , LEVOTHROID) 112 MCG tablet Take 112 mcg by mouth daily before breakfast.    [provider]  linaclotide  (LINZESS ) 290 MCG CAPS capsule Take 1 capsule (290 mcg total) by mouth daily before breakfast. Patient not taking: Reported on 12/22/2016 06/05/16   Love, Sharlet RAMAN, PA-C  Multiple Vitamin (MULTIVITAMIN WITH MINERALS) TABS tablet Take 1 tablet by mouth daily. Patient not taking: Reported on 12/22/2016 06/05/16   Love, Sharlet RAMAN, PA-C  nicotine  (NICODERM CQ  - DOSED IN MG/24 HOURS) 14 mg/24hr patch Place 1 patch (14 mg total) onto the skin daily. Patient not taking: Reported on 11/21/2016 06/05/16   Love, Sharlet RAMAN, PA-C  Oxycodone  HCl 10 MG TABS Take 10 mg by mouth every 4 (four) hours. Do not exceed more than 5 tablets daily    [provider]  phenazopyridine  (PYRIDIUM ) 200 MG tablet Take 1 tablet (200 mg total) by mouth 3 (three) times daily. 03/20/22   Raford Lenis, MD  potassium chloride  (K-DUR) 10 MEQ tablet Take 1 tablet (10 mEq total) by mouth daily. Patient not taking: Reported on 12/22/2016 06/04/16   Maurice Sharlet RAMAN, PA-C  silver  sulfADIAZINE  (SILVADENE ) 1 % cream Apply 1 Application topically 2 (two) times daily. 10/22/21   Rising, Asberry, PA-C  spironolactone  (ALDACTONE ) 50 MG tablet Take 1 tablet (50 mg total) by mouth 2 (two) times daily. Patient not taking: Reported on 04/27/2018 06/04/16   Maurice Sharlet RAMAN, PA-C  thiamine  100 MG tablet Take 1 tablet (100 mg total) by mouth daily. Patient not taking: Reported on 04/27/2018 06/05/16   Maurice Sharlet RAMAN, PA-C  zolpidem  (AMBIEN ) 5 MG tablet Take 1 tablet (5 mg total) by mouth at bedtime as needed for sleep. Patient not taking: Reported on 04/27/2018 08/06/16   Debby Fidela CROME, NP    Allergies: Amitriptyline, Acetaminophen , Hydrocodone , Motrin [ibuprofen], Hydrocodone , Motrin [ibuprofen], and Tylenol  [acetaminophen ]    Review of Systems  Constitutional:  Negative for fever.  HENT:  Negative for sore throat.   Respiratory:   Negative for shortness of breath.   Cardiovascular:  Negative for chest pain.  Gastrointestinal:  Negative for abdominal pain, nausea and vomiting.  Genitourinary:  Negative for dysuria.  Skin:  Negative for rash.  Neurological:  Negative for headaches.    Updated Vital Signs BP (!) 141/77 (BP Location: Left Arm)   Pulse (!) 50   Temp 97.6 F (36.4 C) (Oral)   Resp 18   SpO2 100%   Physical Exam Vitals and nursing note reviewed.  Constitutional:      General: He is not in acute distress.    Appearance: Normal appearance. He is well-developed.  HENT:     Head: Normocephalic and atraumatic.   Eyes:     Conjunctiva/sclera: Conjunctivae normal.    Cardiovascular:     Rate and Rhythm: Regular rhythm. Bradycardia present.     Heart sounds: No murmur heard. Pulmonary:     Effort: Pulmonary effort is normal. No respiratory distress.     Breath sounds: Normal breath sounds.  Abdominal:     Palpations: Abdomen is soft.     Tenderness: There is no abdominal tenderness. There is no guarding or rebound.   Musculoskeletal:        General: No tenderness or deformity.     Cervical back: Neck supple.   Skin:    General: Skin is warm and dry.     Capillary Refill: Capillary refill takes less than 2 seconds.   Neurological:     General: No focal deficit present.     Mental Status: He is alert.     Sensory: No sensory deficit.     Motor: No weakness.   Psychiatric:        Mood and Affect: Mood normal.     (all labs ordered are listed, but only abnormal results are displayed) Labs Reviewed - No data to display  EKG: EKG Interpretation Date/Time:  Saturday October 17 2023 15:53:36 EDT Ventricular Rate:  52 PR Interval:  230 QRS Duration:  123 QT Interval:  516 QTC Calculation: 480 R Axis:   -71  Text Interpretation: Sinus or ectopic atrial rhythm Prolonged PR interval Nonspecific IVCD with LAD Anterior infarct, old Nonspecific T abnormalities, lateral leads new  nonspecific changes from prior 7/18 Confirmed by Towana Sharper [phone]) on 10/17/2023 4:02:55 PM  Radiology: No results found.  {Document cardiac monitor, telemetry assessment procedure when appropriate:32947} Procedures   Medications Ordered in the ED  sodium chloride  0.9 % bolus 1,000 mL (has no administration in time range)      {Click here for ABCD2, HEART and other calculators REFRESH Note before signing:1}                              Medical Decision Making Amount and/or Complexity of Data Reviewed Labs: ordered. Radiology: ordered.   This patient complains of ***; this involves an extensive number  of treatment Options and is a complaint that carries with it a high risk of complications and morbidity. The differential includes ***  I ordered, reviewed and interpreted labs, which included *** I ordered medication *** and reviewed PMP when indicated. I ordered imaging studies which included *** and I independently    visualized and interpreted imaging which showed *** Additional history obtained from *** Previous records obtained and reviewed *** I consulted *** and discussed lab and imaging findings and discussed disposition.  Cardiac monitoring reviewed, *** Social determinants considered, *** Critical Interventions: ***  After the interventions stated above, I reevaluated the patient and found *** Admission and further testing considered, ***   {Document critical care time when appropriate  Document review of labs and clinical decision tools ie CHADS2VASC2, etc  Document your independent review of radiology images and any outside records  Document your discussion with family members, caretakers and with consultants  Document social determinants of health affecting pt's care  Document your decision making why or why not admission, treatments were needed:32947:::1}   Final diagnoses:  None    ED Discharge Orders     None

## 2023-10-18 ENCOUNTER — Encounter (HOSPITAL_COMMUNITY): Payer: Self-pay | Admitting: Family Medicine

## 2023-10-18 DIAGNOSIS — E871 Hypo-osmolality and hyponatremia: Secondary | ICD-10-CM | POA: Diagnosis present

## 2023-10-18 DIAGNOSIS — E890 Postprocedural hypothyroidism: Secondary | ICD-10-CM | POA: Diagnosis present

## 2023-10-18 DIAGNOSIS — B9620 Unspecified Escherichia coli [E. coli] as the cause of diseases classified elsewhere: Secondary | ICD-10-CM | POA: Diagnosis present

## 2023-10-18 DIAGNOSIS — T68XXXA Hypothermia, initial encounter: Secondary | ICD-10-CM

## 2023-10-18 DIAGNOSIS — Z811 Family history of alcohol abuse and dependence: Secondary | ICD-10-CM | POA: Diagnosis not present

## 2023-10-18 DIAGNOSIS — G894 Chronic pain syndrome: Secondary | ICD-10-CM | POA: Diagnosis present

## 2023-10-18 DIAGNOSIS — E876 Hypokalemia: Secondary | ICD-10-CM | POA: Diagnosis present

## 2023-10-18 DIAGNOSIS — D649 Anemia, unspecified: Secondary | ICD-10-CM | POA: Diagnosis present

## 2023-10-18 DIAGNOSIS — Z79891 Long term (current) use of opiate analgesic: Secondary | ICD-10-CM | POA: Diagnosis not present

## 2023-10-18 DIAGNOSIS — F1910 Other psychoactive substance abuse, uncomplicated: Secondary | ICD-10-CM

## 2023-10-18 DIAGNOSIS — Z923 Personal history of irradiation: Secondary | ICD-10-CM | POA: Diagnosis not present

## 2023-10-18 DIAGNOSIS — R001 Bradycardia, unspecified: Secondary | ICD-10-CM | POA: Diagnosis present

## 2023-10-18 DIAGNOSIS — Z79890 Hormone replacement therapy (postmenopausal): Secondary | ICD-10-CM | POA: Diagnosis not present

## 2023-10-18 DIAGNOSIS — D696 Thrombocytopenia, unspecified: Secondary | ICD-10-CM | POA: Insufficient documentation

## 2023-10-18 DIAGNOSIS — Z833 Family history of diabetes mellitus: Secondary | ICD-10-CM | POA: Diagnosis not present

## 2023-10-18 DIAGNOSIS — R4182 Altered mental status, unspecified: Secondary | ICD-10-CM | POA: Diagnosis present

## 2023-10-18 DIAGNOSIS — G9340 Encephalopathy, unspecified: Secondary | ICD-10-CM | POA: Diagnosis not present

## 2023-10-18 DIAGNOSIS — G928 Other toxic encephalopathy: Secondary | ICD-10-CM | POA: Diagnosis present

## 2023-10-18 DIAGNOSIS — E039 Hypothyroidism, unspecified: Secondary | ICD-10-CM

## 2023-10-18 DIAGNOSIS — Z8546 Personal history of malignant neoplasm of prostate: Secondary | ICD-10-CM | POA: Diagnosis not present

## 2023-10-18 DIAGNOSIS — K7030 Alcoholic cirrhosis of liver without ascites: Secondary | ICD-10-CM | POA: Diagnosis present

## 2023-10-18 DIAGNOSIS — T40605A Adverse effect of unspecified narcotics, initial encounter: Secondary | ICD-10-CM | POA: Diagnosis present

## 2023-10-18 DIAGNOSIS — T43621A Poisoning by amphetamines, accidental (unintentional), initial encounter: Secondary | ICD-10-CM | POA: Diagnosis present

## 2023-10-18 DIAGNOSIS — Z8547 Personal history of malignant neoplasm of testis: Secondary | ICD-10-CM | POA: Diagnosis not present

## 2023-10-18 DIAGNOSIS — N390 Urinary tract infection, site not specified: Secondary | ICD-10-CM | POA: Diagnosis present

## 2023-10-18 DIAGNOSIS — Z806 Family history of leukemia: Secondary | ICD-10-CM | POA: Diagnosis not present

## 2023-10-18 DIAGNOSIS — F17210 Nicotine dependence, cigarettes, uncomplicated: Secondary | ICD-10-CM | POA: Diagnosis present

## 2023-10-18 DIAGNOSIS — Z825 Family history of asthma and other chronic lower respiratory diseases: Secondary | ICD-10-CM | POA: Diagnosis not present

## 2023-10-18 DIAGNOSIS — Z820 Family history of epilepsy and other diseases of the nervous system: Secondary | ICD-10-CM | POA: Diagnosis not present

## 2023-10-18 LAB — CBC
HCT: 27.9 % — ABNORMAL LOW (ref 39.0–52.0)
Hemoglobin: 9.5 g/dL — ABNORMAL LOW (ref 13.0–17.0)
MCH: 31.1 pg (ref 26.0–34.0)
MCHC: 34.1 g/dL (ref 30.0–36.0)
MCV: 91.5 fL (ref 80.0–100.0)
Platelets: 117 10*3/uL — ABNORMAL LOW (ref 150–400)
RBC: 3.05 MIL/uL — ABNORMAL LOW (ref 4.22–5.81)
RDW: 14.3 % (ref 11.5–15.5)
WBC: 4.1 10*3/uL (ref 4.0–10.5)
nRBC: 0 % (ref 0.0–0.2)

## 2023-10-18 LAB — BASIC METABOLIC PANEL WITH GFR
Anion gap: 9 (ref 5–15)
BUN: 13 mg/dL (ref 6–20)
CO2: 21 mmol/L — ABNORMAL LOW (ref 22–32)
Calcium: 7 mg/dL — ABNORMAL LOW (ref 8.9–10.3)
Chloride: 103 mmol/L (ref 98–111)
Creatinine, Ser: 0.9 mg/dL (ref 0.61–1.24)
GFR, Estimated: >60 mL/min (ref >60)
Glucose, Bld: 108 mg/dL — ABNORMAL HIGH (ref 70–99)
Potassium: 2.7 mmol/L — CL (ref 3.5–5.1)
Sodium: 133 mmol/L — ABNORMAL LOW (ref 135–145)

## 2023-10-18 LAB — HIV ANTIBODY (ROUTINE TESTING W REFLEX): HIV Screen 4th Generation wRfx: NONREACTIVE

## 2023-10-18 LAB — CBG MONITORING, ED: Glucose-Capillary: 102 mg/dL — ABNORMAL HIGH (ref 70–99)

## 2023-10-18 LAB — I-STAT CG4 LACTIC ACID, ED: Lactic Acid, Venous: 0.7 mmol/L (ref 0.5–1.9)

## 2023-10-18 LAB — T4, FREE: Free T4: <0.25 ng/dL — ABNORMAL LOW (ref 0.61–1.12)

## 2023-10-18 LAB — MRSA NEXT GEN BY PCR, NASAL: MRSA by PCR Next Gen: DETECTED — AB

## 2023-10-18 LAB — MAGNESIUM: Magnesium: 1.5 mg/dL — ABNORMAL LOW (ref 1.7–2.4)

## 2023-10-18 MED ORDER — LACTATED RINGERS IV SOLN: INTRAVENOUS | Status: DC

## 2023-10-18 MED ORDER — NALOXONE HCL 0.4 MG/ML IJ SOLN: 0.2000 mg | INTRAMUSCULAR | Status: DC | PRN

## 2023-10-18 MED ORDER — SODIUM CHLORIDE 0.9 % IV BOLUS (SEPSIS)
1000.0000 mL | Freq: Once | INTRAVENOUS | Status: AC
  Administered 2023-10-18: 1000 mL via INTRAVENOUS

## 2023-10-18 MED ORDER — MELATONIN 5 MG PO TABS
5.0000 mg | ORAL_TABLET | Freq: Once | ORAL | Status: AC
  Administered 2023-10-18: 5 mg via ORAL
  Filled 2023-10-18: qty 1

## 2023-10-18 MED ORDER — SODIUM CHLORIDE 0.9 % IV SOLN
1.0000 g | INTRAVENOUS | Status: DC
  Administered 2023-10-19: 1 g via INTRAVENOUS
  Filled 2023-10-18: qty 10

## 2023-10-18 MED ORDER — SODIUM CHLORIDE 0.9 % IV SOLN
1.0000 g | Freq: Once | INTRAVENOUS | Status: AC
  Administered 2023-10-18: 1 g via INTRAVENOUS
  Filled 2023-10-18: qty 10

## 2023-10-18 MED ORDER — SODIUM CHLORIDE 0.9 % IV SOLN
1000.0000 mL | INTRAVENOUS | Status: DC
  Administered 2023-10-18 (×2): 1000 mL via INTRAVENOUS

## 2023-10-18 MED ORDER — LEVOTHYROXINE SODIUM 112 MCG PO TABS
112.0000 ug | ORAL_TABLET | Freq: Every day | ORAL | Status: DC
  Administered 2023-10-18 – 2023-10-19 (×2): 112 ug via ORAL
  Filled 2023-10-18 (×2): qty 1

## 2023-10-18 MED ORDER — NICOTINE 21 MG/24HR TD PT24
21.0000 mg | MEDICATED_PATCH | Freq: Every day | TRANSDERMAL | Status: DC
  Administered 2023-10-18: 21 mg via TRANSDERMAL
  Filled 2023-10-18: qty 1

## 2023-10-18 MED ORDER — CHLORHEXIDINE GLUCONATE CLOTH 2 % EX PADS
6.0000 | MEDICATED_PAD | Freq: Every day | CUTANEOUS | Status: DC
  Administered 2023-10-18: 6 via TOPICAL

## 2023-10-18 MED ORDER — MAGNESIUM SULFATE 2 GM/50ML IV SOLN: 2.0000 g | Freq: Once | INTRAVENOUS | Status: DC

## 2023-10-18 MED ORDER — MAGNESIUM SULFATE 2 GM/50ML IV SOLN
2.0000 g | Freq: Once | INTRAVENOUS | Status: AC
  Administered 2023-10-18: 2 g via INTRAVENOUS
  Filled 2023-10-18: qty 50

## 2023-10-18 MED ORDER — ENZALUTAMIDE 40 MG PO CAPS: 160.0000 mg | ORAL_CAPSULE | Freq: Every day | ORAL | Status: DC

## 2023-10-18 MED ORDER — OXYCODONE HCL 5 MG PO TABS
10.0000 mg | ORAL_TABLET | Freq: Every day | ORAL | Status: DC | PRN
  Administered 2023-10-18 – 2023-10-19 (×8): 10 mg via ORAL
  Filled 2023-10-18 (×8): qty 2

## 2023-10-18 MED ORDER — OXYCODONE HCL 10 MG PO TABS: 10.0000 mg | ORAL_TABLET | Freq: Every day | ORAL | Status: DC

## 2023-10-18 MED ORDER — TIZANIDINE HCL 4 MG PO TABS
2.0000 mg | ORAL_TABLET | Freq: Once | ORAL | Status: AC
  Administered 2023-10-18: 2 mg via ORAL
  Filled 2023-10-18: qty 1

## 2023-10-18 MED ORDER — POTASSIUM CHLORIDE CRYS ER 20 MEQ PO TBCR
40.0000 meq | EXTENDED_RELEASE_TABLET | Freq: Four times a day (QID) | ORAL | Status: AC
  Administered 2023-10-18 (×2): 40 meq via ORAL
  Filled 2023-10-18 (×2): qty 2

## 2023-10-18 MED ORDER — ORAL CARE MOUTH RINSE: 15.0000 mL | OROMUCOSAL | Status: DC | PRN
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
## 2023-10-18 NOTE — Plan of Care (Signed)
  Problem: Clinical Measurements: Goal: Ability to maintain clinical measurements within normal limits will improve Outcome: Progressing Goal: Will remain free from infection Outcome: Progressing Goal: Diagnostic test results will improve Outcome: Progressing Goal: Respiratory complications will improve Outcome: Progressing   Problem: Nutrition: Goal: Adequate nutrition will be maintained Outcome: Progressing   Problem: Elimination: Goal: Will not experience complications related to urinary retention Outcome: Progressing   Problem: Pain Managment: Goal: General experience of comfort will improve and/or be controlled Outcome: Not Progressing

## 2023-10-18 NOTE — Hospital Course (Addendum)
 63 year old man PMH including testicular cancer, prostate cancer status post prostatectomy 2017, hypothyroidism off medications for some time, chronic back pain on narcotics, reported cirrhosis, found at home unresponsive, treated with Narcan  with improvement.  Encephalopathy thought to be toxic in nature from narcotics.  Urine drug screen was also positive for amphetamines and cocaine.  Admitted for further evaluation of encephalopathy, bradycardia, hypokalemia, hyponatremia, hypothermia, hypothyroidism.  Consultants None   Procedures/Events None

## 2023-10-18 NOTE — ED Provider Notes (Signed)
 Called to room for hypotension noted with sBP in 70s. Patient admitted for hypokalemia and bradycardia.  On my review, UA is concerning for infection. Septic work up initiated. Empiric Abx given. Additional IVF given.  Lactic normal. BP improving with IVF.  Admitting team updated   Trine Raynell Moder, MD 10/18/23 [phone]

## 2023-10-19 DIAGNOSIS — R001 Bradycardia, unspecified: Secondary | ICD-10-CM | POA: Diagnosis not present

## 2023-10-19 DIAGNOSIS — F1910 Other psychoactive substance abuse, uncomplicated: Secondary | ICD-10-CM | POA: Diagnosis not present

## 2023-10-19 DIAGNOSIS — E039 Hypothyroidism, unspecified: Secondary | ICD-10-CM | POA: Diagnosis not present

## 2023-10-19 DIAGNOSIS — G9340 Encephalopathy, unspecified: Secondary | ICD-10-CM | POA: Diagnosis not present

## 2023-10-19 LAB — BLOOD CULTURE ID PANEL (REFLEXED) - BCID2

## 2023-10-19 LAB — COMPREHENSIVE METABOLIC PANEL WITH GFR
ALT: 14 U/L (ref 0–44)
AST: 30 U/L (ref 15–41)
Albumin: 3.3 g/dL — ABNORMAL LOW (ref 3.5–5.0)
Alkaline Phosphatase: 62 U/L (ref 38–126)
Anion gap: 9 (ref 5–15)
BUN: 13 mg/dL (ref 6–20)
CO2: 24 mmol/L (ref 22–32)
Calcium: 8.4 mg/dL — ABNORMAL LOW (ref 8.9–10.3)
Chloride: 98 mmol/L (ref 98–111)
Creatinine, Ser: 1.05 mg/dL (ref 0.61–1.24)
GFR, Estimated: >60 mL/min (ref >60)
Glucose, Bld: 90 mg/dL (ref 70–99)
Potassium: 3 mmol/L — ABNORMAL LOW (ref 3.5–5.1)
Sodium: 131 mmol/L — ABNORMAL LOW (ref 135–145)
Total Bilirubin: 0.6 mg/dL (ref 0.0–1.2)
Total Protein: 6.6 g/dL (ref 6.5–8.1)

## 2023-10-19 LAB — IRON AND TIBC
Iron: 79 ug/dL (ref 45–182)
Saturation Ratios: 26 % (ref 17.9–39.5)
TIBC: 309 ug/dL (ref 250–450)
UIBC: 230 ug/dL

## 2023-10-19 LAB — CBC
HCT: 35.3 % — ABNORMAL LOW (ref 39.0–52.0)
Hemoglobin: 11.4 g/dL — ABNORMAL LOW (ref 13.0–17.0)
MCH: 30.6 pg (ref 26.0–34.0)
MCHC: 32.3 g/dL (ref 30.0–36.0)
MCV: 94.6 fL (ref 80.0–100.0)
Platelets: 148 10*3/uL — ABNORMAL LOW (ref 150–400)
RBC: 3.73 MIL/uL — ABNORMAL LOW (ref 4.22–5.81)
RDW: 14.3 % (ref 11.5–15.5)
WBC: 6.5 10*3/uL (ref 4.0–10.5)
nRBC: 0 % (ref 0.0–0.2)

## 2023-10-19 LAB — VITAMIN B12: Vitamin B-12: 424 pg/mL (ref 180–914)

## 2023-10-19 LAB — RETICULOCYTES
Immature Retic Fract: 7.7 % (ref 2.3–15.9)
RBC.: 3.73 MIL/uL — ABNORMAL LOW (ref 4.22–5.81)
Retic Count, Absolute: 48.5 10*3/uL (ref 19.0–186.0)
Retic Ct Pct: 1.3 % (ref 0.4–3.1)

## 2023-10-19 LAB — FERRITIN: Ferritin: 58 ng/mL (ref 24–336)

## 2023-10-19 LAB — FOLATE: Folate: 8.1 ng/mL (ref >5.9)

## 2023-10-19 MED ORDER — POTASSIUM CHLORIDE CRYS ER 20 MEQ PO TBCR
60.0000 meq | EXTENDED_RELEASE_TABLET | Freq: Once | ORAL | Status: AC
  Administered 2023-10-19: 60 meq via ORAL
  Filled 2023-10-19: qty 3

## 2023-10-19 MED ORDER — HYDROCHLOROTHIAZIDE 12.5 MG PO TABS: 12.5000 mg | ORAL_TABLET | Freq: Every day | ORAL | 0 refills | Status: DC

## 2023-10-19 MED ORDER — POTASSIUM CHLORIDE CRYS ER 20 MEQ PO TBCR: 20.0000 meq | EXTENDED_RELEASE_TABLET | Freq: Every day | ORAL | 0 refills | Status: DC

## 2023-10-19 MED ORDER — LEVOTHYROXINE SODIUM 112 MCG PO TABS: 112.0000 ug | ORAL_TABLET | Freq: Every day | ORAL | 0 refills | Status: DC

## 2023-10-19 MED ORDER — CHLORHEXIDINE GLUCONATE CLOTH 2 % EX PADS: 6.0000 | MEDICATED_PAD | Freq: Every day | CUTANEOUS | Status: DC

## 2023-10-19 MED ORDER — MUPIROCIN 2 % EX OINT: 1.0000 | TOPICAL_OINTMENT | Freq: Two times a day (BID) | CUTANEOUS | Status: DC

## 2023-10-19 MED ORDER — SULFAMETHOXAZOLE-TRIMETHOPRIM 800-160 MG PO TABS
1.0000 | ORAL_TABLET | Freq: Two times a day (BID) | ORAL | 0 refills | Status: DC
Start: 2023-10-19 — End: 2024-03-16

## 2023-10-19 NOTE — Plan of Care (Signed)
  Problem: Clinical Measurements: Goal: Diagnostic test results will improve Outcome: Progressing   Problem: Activity: Goal: Risk for activity intolerance will decrease Outcome: Progressing   Problem: Nutrition: Goal: Adequate nutrition will be maintained Outcome: Progressing   Problem: Coping: Goal: Level of anxiety will decrease Outcome: Progressing   Problem: Pain Managment: Goal: General experience of comfort will improve and/or be controlled Outcome: Progressing   Problem: Clinical Measurements: Goal: Ability to maintain clinical measurements within normal limits will improve Outcome: Adequate for Discharge Goal: Will remain free from infection Outcome: Adequate for Discharge Goal: Respiratory complications will improve Outcome: Adequate for Discharge Goal: Cardiovascular complication will be avoided Outcome: Adequate for Discharge

## 2023-10-19 NOTE — Progress Notes (Signed)
   10/19/23 0947  TOC Brief Assessment  Insurance and Status Reviewed ([place] MEDICAID PREPAID HEALTH PLAN / [place] MEDICAID HEALTHY BLUE)  Patient has primary care physician Yes [facility] [phone])  Home environment has been reviewed Single family home with family  Prior level of function: Independent with ADL's  Prior/Current Home Services No current home services  Social Drivers of Health Review SDOH reviewed no interventions necessary  Readmission risk has been reviewed Yes  Transition of care needs no transition of care needs at this time   Pt screened. TOC consulted for PCP needs and substance abuse counseling. Pt denies any substance abuse needs. Asked if he needed any resources to help with substance use and he denied any needs. Pt PCP is [facility]. Explained that pt would need to make an appt for follow-up after this hospital stay and he voiced understanding. PCP number given to pt. There are no HH or DME needs identified. There are no other TOC needs at this time.

## 2023-10-19 NOTE — Progress Notes (Signed)
 PHARMACY - PHYSICIAN COMMUNICATION CRITICAL VALUE ALERT - BLOOD CULTURE IDENTIFICATION (BCID)  [name] is an 63 y.o. male who presented to [facility] on 10/17/2023 with a chief complaint of AMS - found unresponsive at home.   Assessment: Pt currently on antibiotics for possible UTI.  -BCID + 1/4 GPR (no BCID panel ran)  Name of physician (or Provider) Contacted: Dr. Jadine  Current antibiotics: Ceftriaxone  1g IV q24h  Changes to prescribed antibiotics recommended: Suspect likely contaminant, no change in antibiotics.   No results found for this or any previous visit.  Ronal CHRISTELLA Rav, PharmD 10/19/2023  8:30 AM

## 2023-10-19 NOTE — Discharge Summary (Signed)
 Physician Discharge Summary   Patient: [name] MRN: [id number] DOB: 03-Apr-1961  Admit date:     10/17/2023  Discharge date: 10/19/23  Discharge Physician: Toribio Door   PCP: Patient, No Pcp Per   Recommendations at discharge:   Hypothyroidism PMH Graves disease s/p radiation  Started back on Synthroid  last outpatient dose, has been off for a while, will need PCP and close follow-up with repeat TSH in 4 to 6 weeks.     Possible UTI Will treat empirically pending culture results Lactic acid within normal limits   Polysubstance abuse (HCC) UDS + cocaine and amphetamines.     Normocytic anemia Thrombocytopenia Longstanding anemia of unclear etiology.   Thrombocytopenia also seen remotely, etiology unclear.   Follow-up as an outpatient   Discharge Diagnoses: Principal Problem:   Acute encephalopathy Active Problems:   Hypokalemia   Hyponatremia   Hypothermia   Hypothyroidism   Malignant neoplasm of prostate s/p robotic prostatectomy 07/30/2015   Polysubstance abuse (HCC)   Tobacco abuse   Alcoholic cirrhosis of liver (HCC)   Chronic pain syndrome   Normocytic anemia   Sinus bradycardia   Thrombocytopenia (HCC)   Bradycardia  Resolved Problems:   Hepatic cirrhosis (HCC)   Anxiety disorder  Hospital Course: 63 year old man PMH including testicular cancer, prostate cancer status post prostatectomy 2017, hypothyroidism off medications for some time, chronic back pain on narcotics, reported cirrhosis, found at home unresponsive, treated with Narcan  with improvement.  Encephalopathy thought to be toxic in nature from narcotics.  Urine drug screen was also positive for amphetamines and cocaine.  Admitted for further evaluation of encephalopathy, bradycardia, hypokalemia, hyponatremia, hypothermia, hypothyroidism.  Patient was observed in the stepdown unit and made rapid clinical improvement with supportive care.  No further specific treatments were needed other than  treatment for UTI.  Syndrome most likely related to untreated hypothyroidism complicated by UTI and narcotics.  Electrolytes were replaced and he was restarted on thyroid  medication.  He was encouraged to establish with PCP on discharge.  Consultants None   Procedures/Events None  *Acute encephalopathy  Sinus bradycardia Hypothermia Found unresponsive at home.  Responded to Narcan .  On chronic opiates for back pain. Hemodynamics stable but noted to be hypothermic and treated with [employer]  Suspect hypothermia secondary to hypothyroidism, narcotics, polysubstance abuse with positive drug screen although patient denies.  Also denies alcohol  use but has history of alcoholic cirrhosis.  CT head no acute changes. Urinalysis abnormal however UTI started.  Given clinical scenario we will treat for 3 days with antibiotics.  At this time sepsis ruled out.  Lactic acid was within normal limits. Chest x-ray my read very similar to June 2018 study.  Clinically no signs of edema or pneumonia.  No further evaluation suggested. Presentation secondary to narcotics and untreated hypothyroidism.   Hypokalemia Hypomagnesemia Hyponatremia Hyponatremia seems to be intermittent and has been seen previously.  Chloride was low on admission, suspect poor solute intake.  Hypothyroidism can cause this rarely. Hypokalemia and hypomagnesemia likely related to poor solute intake.  Patient denies alcohol  intake and serum alcohol  was negative on admission.   Hypothyroidism PMH Graves disease s/p radiation  TSH markedly elevated at 30.72, free T4 undetectable  Symptoms of fatigue, cold intolerance, sinus bradycardia likely related to untreated hypothyroidism  Started back on Synthroid  last outpatient dose, has been off for a while, will need PCP and close follow-up with repeat TSH in 4 to 6 weeks.     Malignant neoplasm of prostate s/p  robotic prostatectomy 07/30/2015 States this is terminal Follows with [facility]    Possible UTI Will treat empirically pending culture results Lactic acid within normal limits   Polysubstance abuse (HCC) UDS + cocaine and amphetamines.  Patient denies.   Normocytic anemia Thrombocytopenia Longstanding anemia of unclear etiology.   Thrombocytopenia also seen remotely, etiology unclear.   Follow-up as an outpatient   Tobacco abuse Declines Nicotine  patch Encouraged cessation    Alcoholic cirrhosis of liver (HCC) LFTs and INR stable  Reportedly sober   Chronic pain syndrome On oxycodone  from neurosurgery #150/month per pmp Continue chronic pain medication  Disposition: Home Diet recommendation:  Regular diet DISCHARGE MEDICATION: Allergies as of 10/19/2023       Reactions   Amitriptyline Swelling, Other (See Comments)   Swelling in legs   Hydrocodone  Nausea And Vomiting   Motrin [ibuprofen] Other (See Comments)   Cannot take due to cirrhosis of liver   Hydrocodone  Nausea Only   Tylenol  [acetaminophen ] Other (See Comments)   <2g / day due to cirrhosis        Medication List     TAKE these medications    hydrochlorothiazide  12.5 MG tablet Commonly known as: HYDRODIURIL  Take 1 tablet (12.5 mg total) by mouth daily.   levothyroxine  112 MCG tablet Commonly known as: SYNTHROID  Take 1 tablet (112 mcg total) by mouth daily before breakfast.   Oxycodone  HCl 10 MG Tabs Take 10 mg by mouth 5 (five) times daily.   potassium chloride  SA 20 MEQ tablet Commonly known as: KLOR-CON  M Take 1 tablet (20 mEq total) by mouth daily.   sulfamethoxazole -trimethoprim  800-160 MG tablet Commonly known as: BACTRIM  DS Take 1 tablet by mouth 2 (two) times daily.   Xtandi 40 MG capsule Generic drug: enzalutamide Take 160 mg by mouth daily.       Feels better  Discharge Exam: Filed Weights   10/18/23 1000  Weight: 82.6 kg   Physical Exam Vitals reviewed.  Constitutional:      General: He is not in acute distress.    Appearance: He is  not ill-appearing or toxic-appearing.   Cardiovascular:     Rate and Rhythm: Normal rate and regular rhythm.     Heart sounds: No murmur heard. Pulmonary:     Effort: Pulmonary effort is normal. No respiratory distress.     Breath sounds: No wheezing, rhonchi or rales.   Neurological:     Mental Status: He is alert.   Psychiatric:        Mood and Affect: Mood normal.        Behavior: Behavior normal.      Condition at discharge: good  The results of significant diagnostics from this hospitalization (including imaging, microbiology, ancillary and laboratory) are listed below for reference.   Imaging Studies: CT HEAD WO CONTRAST ( ) Result Date: 10/17/2023 CLINICAL DATA:  Acute neurologic deficit EXAM: CT HEAD WITHOUT CONTRAST TECHNIQUE: Contiguous axial images were obtained from the base of the skull through the vertex without intravenous contrast. RADIATION DOSE REDUCTION: This exam was performed according to the departmental dose-optimization program which includes automated exposure control, adjustment of the mA and/or kV according to patient size and/or use of iterative reconstruction technique. COMPARISON:  05/10/2016 FINDINGS: Brain: There is no mass, hemorrhage or extra-axial collection. The size and configuration of the ventricles and extra-axial CSF spaces are normal. There is hypoattenuation of the white matter, most commonly indicating chronic small vessel disease. 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 Vascular: Advanced atherosclerotic  calcification of both internal carotid arteries at the skull base. Skull: The visualized skull base, calvarium and extracranial soft tissues are normal. Sinuses/Orbits: No fluid levels or advanced mucosal thickening of the visualized paranasal sinuses. No mastoid or middle ear effusion. Normal orbits. Other: None. IMPRESSION: 1. No acute intracranial abnormality. 2. 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  Electronically Signed   By: Franky Stanford M.D.   On: 10/17/2023 22:21   DG Chest Port 1 View Result Date: 10/17/2023 CLINICAL DATA:  Weakness and fatigue. EXAM: PORTABLE CHEST 1 VIEW COMPARISON:  03/23/2023. FINDINGS: The heart is enlarged the mediastinal contour stable. The pulmonary vasculature is distended. Interstitial prominence with diffuse hazy airspace disease is present within the lungs bilaterally. No effusion or pneumothorax is seen. Old rib fractures are noted on the left. No acute osseous abnormality is seen. Cervical spinal fusion hardware is noted. IMPRESSION: 1. Cardiomegaly with pulmonary vascular congestion. 2. Interstitial prominence with diffuse hazy airspace disease in the lungs bilaterally, possible edema or infiltrate. Electronically Signed   By: Leita Birmingham M.D.   On: 10/17/2023 17:30    Microbiology: Results for orders placed or performed during the hospital encounter of 10/17/23  Urine Culture (for pregnant, neutropenic or urologic patients or patients with an indwelling urinary catheter)     Status: Abnormal (Preliminary result)   Collection Time: 10/17/23  5:11 PM   Specimen: Urine, Clean Catch  Result Value Ref Range Status   Specimen Description   Final    URINE, CLEAN CATCH Performed at [facility], [address], [place], [place] [address]    Special Requests   Final    NONE Performed at [facility], [address], [place], [place] [address]    Culture (A)  Final    >=100,000 COLONIES/mL ESCHERICHIA COLI SUSCEPTIBILITIES TO FOLLOW CULTURE REINCUBATED FOR BETTER GROWTH Performed at [facility], [address] [place], [place] [address]    Report Status PENDING  Incomplete  Blood culture (routine x 2)     Status: None (Preliminary result)   Collection Time: 10/18/23  3:17 AM   Specimen: BLOOD LEFT ARM  Result Value Ref Range Status   Specimen Description   Final    BLOOD LEFT ARM Performed at [facility], [address], [place], [place] [address]    Special Requests   Final    BOTTLES DRAWN AEROBIC AND ANAEROBIC Blood Culture results may not be optimal due to an inadequate volume of blood received in culture bottles Performed at [facility], [address], [place], [place] [address]    Culture  Setup Time   Final    GRAM POSITIVE RODS AEROBIC BOTTLE ONLY CRITICAL RESULT CALLED TO, READ BACK BY AND VERIFIED WITH: PHARMD [email or web address] AT 0820 ON 10/19/2023 BY T.SAAD. Performed at [facility], [address], [place], [place] [address]    Culture GRAM POSITIVE RODS  Final   Report Status PENDING  Incomplete  Blood culture (routine x 2)     Status: None (Preliminary result)   Collection Time: 10/18/23  3:17 AM   Specimen: BLOOD RIGHT ARM  Result Value Ref Range Status   Specimen Description   Final    BLOOD RIGHT ARM Performed at [facility], [address] [place], [place] [address]    Special Requests   Final    BOTTLES DRAWN AEROBIC AND ANAEROBIC Blood Culture results may  not be optimal due to an inadequate volume of blood received in culture bottles Performed at [facility], [address], [place], [place] [address]    Culture  Setup Time   Final    GRAM POSITIVE RODS AEROBIC BOTTLE ONLY CRITICAL RESULT CALLED TO, READ BACK BY AND VERIFIED WITH: VIPUL [facility] MD ON 10/19/23 @ 1501 BY DRT Performed at [facility], [address], [place], [place] [address]    Culture GRAM POSITIVE RODS  Final   Report Status PENDING  Incomplete  MRSA Next Gen by PCR, Nasal     Status: Abnormal   Collection Time: 10/18/23  8:15 AM   Specimen: Nasal Mucosa; Nasal Swab  Result Value Ref Range Status   MRSA by PCR Next Gen DETECTED (A) NOT DETECTED Final    Comment: RESULT CALLED TO, READ BACK BY AND VERIFIED WITH: ANICE PARAS RN @ [phone] 10/18/23. GILBERTL (NOTE) The GeneXpert MRSA Assay (FDA approved for NASAL specimens only), is  one component of a comprehensive MRSA colonization surveillance program. It is not intended to diagnose MRSA infection nor to guide or monitor treatment for MRSA infections. Test performance is not FDA approved in patients less than 38 years old. Performed at [facility], [address] [place], [place] [address]     Labs: CBC: Recent Labs  Lab 10/17/23 1711 10/18/23 0420 10/19/23 0255  WBC 4.6 4.1 6.5  NEUTROABS 3.3  --   --   HGB 11.3* 9.5* 11.4*  HCT 33.0* 27.9* 35.3*  MCV 89.7 91.5 94.6  PLT 142* 117* 148*   Basic Metabolic Panel: Recent Labs  Lab 10/17/23 1711 10/18/23 0420 10/19/23 0255  NA 128* 133* 131*  K 2.6* 2.7* 3.0*  CL 86* 103 98  CO2 28 21* 24  GLUCOSE 153* 108* 90  BUN 18 13 13   CREATININE 1.29* 0.90 1.05  CALCIUM 9.0 7.0* 8.4*  MG 1.8 1.5*  --    Liver Function Tests: Recent Labs  Lab 10/17/23 1711 10/19/23 0255  AST 27 30  ALT 11 14  ALKPHOS 61 62  BILITOT 0.7 0.6  PROT 7.4 6.6  ALBUMIN 3.9 3.3*   CBG: Recent Labs  Lab 10/18/23 0401  GLUCAP 102*    Discharge time spent: less than 30 minutes.  Signed: Toribio Door, MD [facility] 10/19/2023

## 2023-10-20 LAB — URINE CULTURE: Culture: >=100000 — AB

## 2023-10-21 LAB — CULTURE, BLOOD (ROUTINE X 2)
Culture  Setup Time: NO GROWTH
Culture  Setup Time: NO GROWTH

## 2023-12-09 ENCOUNTER — Ambulatory Visit (INDEPENDENT_AMBULATORY_CARE_PROVIDER_SITE_OTHER): Admitting: Podiatry

## 2023-12-09 DIAGNOSIS — Z91199 Patient's noncompliance with other medical treatment and regimen due to unspecified reason: Secondary | ICD-10-CM

## 2023-12-09 NOTE — Progress Notes (Signed)
 Cancel 24 hours

## 2023-12-15 ENCOUNTER — Encounter: Payer: Self-pay | Admitting: Podiatry

## 2023-12-15 ENCOUNTER — Ambulatory Visit: Admitting: Podiatry

## 2023-12-15 ENCOUNTER — Ambulatory Visit (INDEPENDENT_AMBULATORY_CARE_PROVIDER_SITE_OTHER)

## 2023-12-15 DIAGNOSIS — L603 Nail dystrophy: Secondary | ICD-10-CM

## 2023-12-15 DIAGNOSIS — G629 Polyneuropathy, unspecified: Secondary | ICD-10-CM

## 2023-12-15 DIAGNOSIS — D2371 Other benign neoplasm of skin of right lower limb, including hip: Secondary | ICD-10-CM

## 2023-12-15 DIAGNOSIS — D2372 Other benign neoplasm of skin of left lower limb, including hip: Secondary | ICD-10-CM | POA: Diagnosis not present

## 2023-12-15 MED ORDER — PREGABALIN 50 MG PO CAPS: 50.0000 mg | ORAL_CAPSULE | Freq: Two times a day (BID) | ORAL | 2 refills | Status: DC
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
## 2024-03-16 ENCOUNTER — Ambulatory Visit (INDEPENDENT_AMBULATORY_CARE_PROVIDER_SITE_OTHER): Admitting: Podiatry

## 2024-03-16 ENCOUNTER — Ambulatory Visit

## 2024-03-16 VITALS — BP 134/65 | HR 65 | Resp 16 | Ht 71.0 in | Wt 179.0 lb

## 2024-03-16 DIAGNOSIS — F1910 Other psychoactive substance abuse, uncomplicated: Secondary | ICD-10-CM

## 2024-03-16 DIAGNOSIS — E039 Hypothyroidism, unspecified: Secondary | ICD-10-CM | POA: Diagnosis not present

## 2024-03-16 DIAGNOSIS — K5900 Constipation, unspecified: Secondary | ICD-10-CM | POA: Insufficient documentation

## 2024-03-16 DIAGNOSIS — I10 Essential (primary) hypertension: Secondary | ICD-10-CM

## 2024-03-16 DIAGNOSIS — R911 Solitary pulmonary nodule: Secondary | ICD-10-CM

## 2024-03-16 DIAGNOSIS — Z136 Encounter for screening for cardiovascular disorders: Secondary | ICD-10-CM

## 2024-03-16 DIAGNOSIS — Z131 Encounter for screening for diabetes mellitus: Secondary | ICD-10-CM

## 2024-03-16 DIAGNOSIS — C61 Malignant neoplasm of prostate: Secondary | ICD-10-CM

## 2024-03-16 DIAGNOSIS — Z91199 Patient's noncompliance with other medical treatment and regimen due to unspecified reason: Secondary | ICD-10-CM

## 2024-03-16 DIAGNOSIS — G894 Chronic pain syndrome: Secondary | ICD-10-CM

## 2024-03-16 DIAGNOSIS — Z1159 Encounter for screening for other viral diseases: Secondary | ICD-10-CM

## 2024-03-16 DIAGNOSIS — K7030 Alcoholic cirrhosis of liver without ascites: Secondary | ICD-10-CM

## 2024-03-16 DIAGNOSIS — C7989 Secondary malignant neoplasm of other specified sites: Secondary | ICD-10-CM

## 2024-03-16 DIAGNOSIS — F1091 Alcohol use, unspecified, in remission: Secondary | ICD-10-CM

## 2024-03-16 MED ORDER — LEVOTHYROXINE SODIUM 112 MCG PO TABS: 112.0000 ug | ORAL_TABLET | Freq: Every day | ORAL | 3 refills | Status: AC
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
## 2024-03-16 NOTE — Progress Notes (Signed)
 No show

## 2024-03-17 ENCOUNTER — Telehealth: Payer: Self-pay

## 2024-03-17 ENCOUNTER — Ambulatory Visit: Payer: Self-pay

## 2024-03-17 DIAGNOSIS — I10 Essential (primary) hypertension: Secondary | ICD-10-CM

## 2024-03-17 LAB — CBC
Hematocrit: 34.6 % — ABNORMAL LOW (ref 37.5–51.0)
Hemoglobin: 11.4 g/dL — ABNORMAL LOW (ref 13.0–17.7)
MCH: 30.6 pg (ref 26.6–33.0)
MCHC: 32.9 g/dL (ref 31.5–35.7)
MCV: 93 fL (ref 79–97)
Platelets: 200 x10E3/uL (ref 150–450)
RBC: 3.72 x10E6/uL — ABNORMAL LOW (ref 4.14–5.80)
RDW: 14.7 % (ref 11.6–15.4)
WBC: 6.3 x10E3/uL (ref 3.4–10.8)

## 2024-03-17 LAB — LIPID PANEL
Chol/HDL Ratio: 3.6 ratio (ref 0.0–5.0)
Cholesterol, Total: 235 mg/dL — ABNORMAL HIGH (ref 100–199)
HDL: 66 mg/dL (ref >39)
LDL Chol Calc (NIH): 157 mg/dL — ABNORMAL HIGH (ref 0–99)
Triglycerides: 71 mg/dL (ref 0–149)
VLDL Cholesterol Cal: 12 mg/dL (ref 5–40)

## 2024-03-17 LAB — COMPREHENSIVE METABOLIC PANEL WITH GFR
ALT: 14 IU/L (ref 0–44)
AST: 29 IU/L (ref 0–40)
Albumin: 4.2 g/dL (ref 3.8–4.9)
Alkaline Phosphatase: 91 IU/L (ref 47–123)
BUN/Creatinine Ratio: 14 (ref 10–24)
BUN: 16 mg/dL (ref 8–27)
Bilirubin Total: 0.2 mg/dL (ref 0.0–1.2)
CO2: 24 mmol/L (ref 20–29)
Calcium: 9.6 mg/dL (ref 8.6–10.2)
Chloride: 95 mmol/L — ABNORMAL LOW (ref 96–106)
Creatinine, Ser: 1.12 mg/dL (ref 0.76–1.27)
Globulin, Total: 2.8 g/dL (ref 1.5–4.5)
Glucose: 83 mg/dL (ref 70–99)
Potassium: 4.5 mmol/L (ref 3.5–5.2)
Sodium: 135 mmol/L (ref 134–144)
Total Protein: 7 g/dL (ref 6.0–8.5)
eGFR: 75 mL/min/1.73 (ref >59)

## 2024-03-17 LAB — HEPATITIS C ANTIBODY: Hep C Virus Ab: NONREACTIVE

## 2024-03-17 LAB — HEMOGLOBIN A1C
Est. average glucose Bld gHb Est-mCnc: 111 mg/dL
Hgb A1c MFr Bld: 5.5 % (ref 4.8–5.6)

## 2024-03-17 LAB — HEPATITIS B SURFACE ANTIBODY,QUALITATIVE: Hep B Surface Ab, Qual: NONREACTIVE

## 2024-03-17 LAB — TSH: TSH: 41.5 u[IU]/mL — ABNORMAL HIGH (ref 0.450–4.500)

## 2024-03-17 LAB — APTT: aPTT: 31 s (ref 24–33)

## 2024-03-17 LAB — PROTIME-INR
INR: 1 (ref 0.9–1.2)
Prothrombin Time: 10.7 s (ref 9.1–12.0)

## 2024-03-17 MED ORDER — AMLODIPINE BESYLATE 5 MG PO TABS: 5.0000 mg | ORAL_TABLET | Freq: Every day | ORAL | 3 refills | Status: AC

## 2024-03-17 NOTE — Telephone Encounter (Signed)
 Copied from CRM #[id number]. Topic: Clinical - Lab/Test Results >> Mar 17, 2024  2:17 PM Ivette P wrote: Reason for CRM:   Pt called in regarding labs read as follows  Please let patient know that his labs are stable except for his TSH (thyroid  hormone). He should restart his thyroid  medication as we discussed and follow up with me in January.   He should also start amlodipine 5mg  for his blood pressure. I sent this to his pharmacy already.    Pt understood and no further questions

## 2024-04-05 NOTE — Progress Notes (Signed)
 Printed results letter will mail to home address.

## 2024-05-18 ENCOUNTER — Ambulatory Visit

## 2024-05-18 NOTE — Progress Notes (Unsigned)
" °  ° ° °  Established patient visit   Patient: [name]   DOB: [birth year]   64 y.o. Male  MRN: [id number] Visit Date: 05/18/2024  Today's healthcare provider: Isaiah DELENA Pepper, MD   No chief complaint on file.  Subjective    HPI  Discussed the use of AI scribe software for clinical note transcription with the patient, who gave verbal consent to proceed.  History of Present Illness    Hep b  Medications: Show/hide medication list[1]  Review of Systems as noted in HPI.  {Insert previous labs (optional):23779} {See past labs  Heme  Chem  Endocrine  Serology  Results Review (optional):1}   Objective    There were no vitals taken for this visit. {Insert last BP/Wt (optional):23777}{See vitals history (optional):1}  Physical Exam   No results found for any visits on 05/18/24.  Assessment & Plan     Problem List Items Addressed This Visit   None   Assessment and Plan Assessment & Plan       No follow-ups on file.       Isaiah DELENA Pepper, MD  [facility] [phone] (phone) [phone] (fax)    [1]  Outpatient Medications Prior to Visit  Medication Sig   amLODipine  (NORVASC ) 5 MG tablet Take 1 tablet (5 mg total) by mouth daily.   levothyroxine  (SYNTHROID ) 112 MCG tablet Take 1 tablet (112 mcg total) by mouth daily before breakfast.   Oxycodone  HCl 10 MG TABS Take 10 mg by mouth 5 (five) times daily.   XTANDI  40 MG capsule Take 160 mg by mouth daily.   No facility-administered medications prior to visit.   "
